# Patient Record
Sex: Male | Born: 1945
Health system: Southern US, Community
[De-identification: ages and names within clinical notes are randomized; demographics above are authoritative.]

## PROBLEM LIST (undated history)

## (undated) DIAGNOSIS — N200 Calculus of kidney: Secondary | ICD-10-CM

## (undated) DIAGNOSIS — Z87898 Personal history of other specified conditions: Secondary | ICD-10-CM

## (undated) DIAGNOSIS — N400 Enlarged prostate without lower urinary tract symptoms: Secondary | ICD-10-CM

## (undated) DIAGNOSIS — R0989 Other specified symptoms and signs involving the circulatory and respiratory systems: Secondary | ICD-10-CM

## (undated) DIAGNOSIS — C801 Malignant (primary) neoplasm, unspecified: Secondary | ICD-10-CM

## (undated) DIAGNOSIS — Z87442 Personal history of urinary calculi: Secondary | ICD-10-CM

## (undated) DIAGNOSIS — T8859XA Other complications of anesthesia, initial encounter: Secondary | ICD-10-CM

## (undated) DIAGNOSIS — C20 Malignant neoplasm of rectum: Secondary | ICD-10-CM

## (undated) DIAGNOSIS — R911 Solitary pulmonary nodule: Secondary | ICD-10-CM

## (undated) DIAGNOSIS — R058 Other specified cough: Secondary | ICD-10-CM

## (undated) DIAGNOSIS — T4145XA Adverse effect of unspecified anesthetic, initial encounter: Secondary | ICD-10-CM

## (undated) DIAGNOSIS — I82409 Acute embolism and thrombosis of unspecified deep veins of unspecified lower extremity: Secondary | ICD-10-CM

## (undated) DIAGNOSIS — J8410 Pulmonary fibrosis, unspecified: Secondary | ICD-10-CM

## (undated) DIAGNOSIS — Q825 Congenital non-neoplastic nevus: Secondary | ICD-10-CM

## (undated) DIAGNOSIS — K219 Gastro-esophageal reflux disease without esophagitis: Secondary | ICD-10-CM

## (undated) DIAGNOSIS — R05 Cough: Secondary | ICD-10-CM

## (undated) DIAGNOSIS — R000 Tachycardia, unspecified: Secondary | ICD-10-CM

## (undated) DIAGNOSIS — R319 Hematuria, unspecified: Secondary | ICD-10-CM

## (undated) DIAGNOSIS — R0600 Dyspnea, unspecified: Secondary | ICD-10-CM

## (undated) DIAGNOSIS — N201 Calculus of ureter: Secondary | ICD-10-CM

## (undated) DIAGNOSIS — R972 Elevated prostate specific antigen [PSA]: Secondary | ICD-10-CM

## (undated) DIAGNOSIS — E785 Hyperlipidemia, unspecified: Secondary | ICD-10-CM

## (undated) HISTORY — DX: Elevated prostate specific antigen (PSA): R97.20

## (undated) HISTORY — DX: Benign prostatic hyperplasia without lower urinary tract symptoms: N40.0

## (undated) HISTORY — DX: Calculus of kidney: N20.0

## (undated) HISTORY — DX: Solitary pulmonary nodule: R91.1

## (undated) HISTORY — DX: Pulmonary fibrosis, unspecified: J84.10

## (undated) HISTORY — PX: TONSILLECTOMY: SUR1361

## (undated) HISTORY — DX: Other specified symptoms and signs involving the circulatory and respiratory systems: R09.89

## (undated) HISTORY — DX: Congenital non-neoplastic nevus: Q82.5

## (undated) HISTORY — DX: Hyperlipidemia, unspecified: E78.5

## (undated) HISTORY — DX: Personal history of other specified conditions: Z87.898

---

## 2005-01-09 HISTORY — PX: EXTRACORPOREAL SHOCK WAVE LITHOTRIPSY: SHX1557

## 2005-07-13 ENCOUNTER — Ambulatory Visit (HOSPITAL_COMMUNITY): Admission: RE | Admit: 2005-07-13 | Discharge: 2005-07-13 | Payer: Self-pay | Admitting: Urology

## 2015-01-27 DIAGNOSIS — Z0000 Encounter for general adult medical examination without abnormal findings: Secondary | ICD-10-CM | POA: Diagnosis not present

## 2015-01-27 DIAGNOSIS — I517 Cardiomegaly: Secondary | ICD-10-CM | POA: Diagnosis not present

## 2015-01-27 DIAGNOSIS — Z1212 Encounter for screening for malignant neoplasm of rectum: Secondary | ICD-10-CM | POA: Diagnosis not present

## 2015-01-27 DIAGNOSIS — N390 Urinary tract infection, site not specified: Secondary | ICD-10-CM | POA: Diagnosis not present

## 2015-01-27 DIAGNOSIS — R918 Other nonspecific abnormal finding of lung field: Secondary | ICD-10-CM | POA: Diagnosis not present

## 2015-01-27 DIAGNOSIS — Z125 Encounter for screening for malignant neoplasm of prostate: Secondary | ICD-10-CM | POA: Diagnosis not present

## 2015-01-27 DIAGNOSIS — N400 Enlarged prostate without lower urinary tract symptoms: Secondary | ICD-10-CM | POA: Diagnosis not present

## 2015-01-27 DIAGNOSIS — R0989 Other specified symptoms and signs involving the circulatory and respiratory systems: Secondary | ICD-10-CM | POA: Diagnosis not present

## 2015-01-27 DIAGNOSIS — N200 Calculus of kidney: Secondary | ICD-10-CM | POA: Diagnosis not present

## 2015-01-28 DIAGNOSIS — Z0000 Encounter for general adult medical examination without abnormal findings: Secondary | ICD-10-CM | POA: Diagnosis not present

## 2015-02-01 ENCOUNTER — Ambulatory Visit (INDEPENDENT_AMBULATORY_CARE_PROVIDER_SITE_OTHER): Payer: PPO | Admitting: Internal Medicine

## 2015-02-01 ENCOUNTER — Encounter: Payer: Self-pay | Admitting: Internal Medicine

## 2015-02-01 VITALS — BP 118/78 | HR 92 | Ht 67.0 in | Wt 185.2 lb

## 2015-02-01 DIAGNOSIS — Z23 Encounter for immunization: Secondary | ICD-10-CM

## 2015-02-01 DIAGNOSIS — J8410 Pulmonary fibrosis, unspecified: Secondary | ICD-10-CM

## 2015-02-01 DIAGNOSIS — R05 Cough: Secondary | ICD-10-CM | POA: Diagnosis not present

## 2015-02-01 DIAGNOSIS — R058 Other specified cough: Secondary | ICD-10-CM

## 2015-02-01 MED ORDER — PANTOPRAZOLE SODIUM 40 MG PO TBEC: 40.0000 mg | DELAYED_RELEASE_TABLET | Freq: Every day | ORAL | Status: DC

## 2015-02-01 MED ORDER — FAMOTIDINE 20 MG PO TABS
ORAL_TABLET | ORAL | Status: DC
Start: 2015-02-01 — End: 2016-03-02

## 2015-02-01 NOTE — Patient Instructions (Signed)
Pantoprazole (protonix) 40 mg   Take  30-60 min before first meal of the day and Pepcid (famotidine)  20 mg one @  bedtime until return to office - this is the best way to tell whether stomach acid is contributing to your problem.    GERD (REFLUX)  is an extremely common cause of respiratory symptoms just like yours , many times with no obvious heartburn at all.    It can be treated with medication, but also with lifestyle changes including elevation of the head of your bed (ideally with 6 inch  bed blocks),  Smoking cessation, avoidance of late meals, excessive alcohol, and avoid fatty foods, chocolate, peppermint, colas, red wine, and acidic juices such as orange juice.  NO MINT OR MENTHOL PRODUCTS SO NO COUGH DROPS  USE SUGARLESS CANDY INSTEAD (Jolley ranchers or Stover's or Life Savers) or even ice chips will also do - the key is to swallow to prevent all throat clearing. NO OIL BASED VITAMINS - use powdered substitutes.   Please schedule a follow up office visit in 6 weeks, call sooner if needed with cxr and pfts

## 2015-02-01 NOTE — Progress Notes (Signed)
   Subjective:    Patient ID: Austin Yu, male    DOB: 02/21/1945,    MRN: [id number]  HPI  52 yowm never smoker with cough onset summer 2016 eval with cxr by Dr Shelia Media who referred him to pulmonary clinic 02/01/2015 with abn cxr ? PF   02/01/2015 1st [facility] office visit/ Trevious Rampey   Chief Complaint  Patient presents with  . Pulmonary Consult    Referred by Dr Shelia Media for abn cxr.   indolent onset dry persistent daily cough x 6 m worse at hs   / worse with laugh /use of voice / no h/o sign arthritis/ amio or chemo exposures  No obvious other patterns in day to day or daytime variabilty or assoc sob or cp or chest tightness, subjective wheeze overt sinus or hb symptoms. No unusual exp hx or h/o childhood pna/ asthma or knowledge of premature birth.  Sleeping ok without nocturnal  or early am exacerbation  of respiratory  c/o's or need for noct saba. Also denies any obvious fluctuation of symptoms with weather or environmental changes or other aggravating or alleviating factors except as outlined above   Current Medications, Allergies, Complete Past Medical History, Past Surgical History, Family History, and Social History were reviewed in [employer] record.              Review of Systems  Constitutional: Negative for fever and unexpected weight change.  HENT: Negative for congestion, dental problem, ear pain, nosebleeds, postnasal drip, rhinorrhea, sinus pressure, sneezing, sore throat and trouble swallowing.   Eyes: Negative for redness and itching.  Respiratory: Positive for cough. Negative for chest tightness, shortness of breath and wheezing.   Cardiovascular: Negative for palpitations and leg swelling.  Gastrointestinal: Negative for nausea and vomiting.  Genitourinary: Negative for dysuria.  Musculoskeletal: Negative for joint swelling.  Skin: Negative for rash.  Neurological: Negative for headaches.  Hematological: Does not bruise/bleed easily.    Psychiatric/Behavioral: Negative for dysphoric mood. The patient is not nervous/anxious.        Objective:   Physical Exam  amb wm nad   Wt Readings from Last 3 Encounters:  02/01/15 185 lb 3.2 oz (84.006 kg)    Vital signs reviewed  HEENT: nl dentition, turbinates, and oropharynx. Nl external ear canals without cough reflex   NECK :  without JVD/Nodes/TM/ nl carotid upstrokes bilaterally   LUNGS: no acc muscle use,  Nl contour chest with subtle end insp crackles both bases    CV:  RRR  no s3 or murmur or increase in P2, no edema   ABD:  soft and nontender with nl inspiratory excursion in the supine position. No bruits or organomegaly, bowel sounds nl  MS:  Nl gait/ ext warm without deformities, calf tenderness, cyanosis  - mild bilateral upper ext clubbing No obvious joint restrictions   SKIN: warm and dry without lesions    NEURO:  alert, approp, nl sensorium with  no motor deficits     I personally reviewed images and agree with radiology impression as follows:   CXR  01/27/15 slt full R hilar/sub hilar on PA only / slt increase int markings and mild reduction in lung vols     Assessment & Plan:

## 2015-02-02 DIAGNOSIS — R05 Cough: Secondary | ICD-10-CM | POA: Insufficient documentation

## 2015-02-02 DIAGNOSIS — R058 Other specified cough: Secondary | ICD-10-CM | POA: Insufficient documentation

## 2015-02-02 NOTE — Assessment & Plan Note (Addendum)
DDx for pulmonary fibrosis  includes idiopathic pulmonary fibrosis, pulmonary fibrosis associated with rheumatologic diseases (which have a relatively benign course in most cases) , adverse effect from  drugs such as chemotherapy or amiodarone exposure, nonspecific interstitial pneumonia which is typically steroid responsive, and chronic hypersensitivity pneumonitis.   In active  smokers Langerhan's Cell  Histiocyctosis (eosinophilic granuomatosis),  DIP,  and Respiratory Bronchiolitis ILD also need to be considered,  But don't apply here  Will need to return for full w/u but in meantime :  Use of PPI is associated with improved survival time and with decreased radiologic fibrosis per King's study published in AJRCCM vol 184 p1390.  Dec 2011 and also may have other beneficial effects as per the latest review in [place] vol 193 H2122 Jun 20016.  This may not always be cause and effect, but given how universally unimpressive and expensive  all the other  Drugs developed to day  have been for pf,   rec start  rx ppi / diet/ lifestyle modification and f/u with serial walking sats and lung volumes for now to put more points on the curve / establish firm baseline before considering additional measures.   I had an extended discussion with the patient reviewing all relevant studies completed to date and  lasting 35 minutes of a 60 minute initial office visit    Each maintenance medication was reviewed in detail including most importantly the difference between maintenance and prns and under what circumstances the prns are to be triggered using an action plan format that is not reflected in the computer generated alphabetically organized AVS.    Please see instructions for details which were reviewed in writing and the patient given a copy highlighting the part that I personally wrote and discussed at today's ov.

## 2015-02-02 NOTE — Assessment & Plan Note (Signed)
The cough that is worse with laughing and talking is not likely related to PF but rather  Classic Upper airway cough syndrome, so named because it's frequently impossible to sort out how much is  CR/sinusitis with freq throat clearing (which can be related to primary GERD)   vs  causing  secondary (" extra esophageal")  GERD from wide swings in gastric pressure that occur with throat clearing, often  promoting self use of mint and menthol lozenges that reduce the lower esophageal sphincter tone and exacerbate the problem further in a cyclical fashion.   These are the same pts (now being labeled as having "irritable larynx syndrome" by some cough centers) who not infrequently have a history of having failed to tolerate ace inhibitors,  dry powder inhalers or biphosphonates or report having atypical reflux symptoms that don't respond to standard doses of PPI , and are easily confused as having aecopd or asthma flares by even experienced allergists/ pulmonologists.   rec max gerd rx/diet for now as this is beneficial in many pts with pf as well

## 2015-03-29 DIAGNOSIS — K219 Gastro-esophageal reflux disease without esophagitis: Secondary | ICD-10-CM | POA: Diagnosis not present

## 2015-03-29 DIAGNOSIS — R3129 Other microscopic hematuria: Secondary | ICD-10-CM | POA: Diagnosis not present

## 2015-03-29 DIAGNOSIS — J8410 Pulmonary fibrosis, unspecified: Secondary | ICD-10-CM | POA: Diagnosis not present

## 2015-04-09 ENCOUNTER — Encounter: Payer: Self-pay | Admitting: Internal Medicine

## 2015-04-09 ENCOUNTER — Ambulatory Visit (INDEPENDENT_AMBULATORY_CARE_PROVIDER_SITE_OTHER)
Admission: RE | Admit: 2015-04-09 | Discharge: 2015-04-09 | Disposition: A | Discharge status: Home / Self Care | Payer: PPO | Source: Ambulatory Visit | Attending: Internal Medicine | Admitting: Internal Medicine

## 2015-04-09 ENCOUNTER — Ambulatory Visit (INDEPENDENT_AMBULATORY_CARE_PROVIDER_SITE_OTHER): Payer: PPO | Admitting: Internal Medicine

## 2015-04-09 VITALS — BP 110/80 | HR 90 | Ht 67.0 in | Wt 186.0 lb

## 2015-04-09 DIAGNOSIS — R058 Other specified cough: Secondary | ICD-10-CM

## 2015-04-09 DIAGNOSIS — J8410 Pulmonary fibrosis, unspecified: Secondary | ICD-10-CM

## 2015-04-09 DIAGNOSIS — R05 Cough: Secondary | ICD-10-CM

## 2015-04-09 LAB — PULMONARY FUNCTION TEST
DL/VA % PRED: 111 %
DL/VA: 4.94 ml/min/mmHg/L
DLCO UNC % PRED: 53 %
DLCO UNC: 15.23 ml/min/mmHg
DLCO cor % pred: 51 %
DLCO cor: 14.61 ml/min/mmHg
FEF 25-75 PRE: 2.9 L/s
FEF 25-75 Post: 3.13 L/sec
FEF2575-%CHANGE-POST: 7 %
FEF2575-%PRED-PRE: 127 %
FEF2575-%Pred-Post: 137 %
FEV1-%CHANGE-POST: -1 %
FEV1-%PRED-PRE: 70 %
FEV1-%Pred-Post: 69 %
FEV1-POST: 2.04 L
FEV1-PRE: 2.06 L
FEV1FVC-%Change-Post: 4 %
FEV1FVC-%Pred-Pre: 118 %
FEV6-%CHANGE-POST: -5 %
FEV6-%PRED-POST: 59 %
FEV6-%PRED-PRE: 62 %
FEV6-POST: 2.22 L
FEV6-PRE: 2.35 L
FEV6FVC-%PRED-POST: 106 %
FEV6FVC-%PRED-PRE: 106 %
FVC-%Change-Post: -5 %
FVC-%Pred-Post: 55 %
FVC-%Pred-Pre: 58 %
FVC-Post: 2.22 L
FVC-Pre: 2.35 L
POST FEV6/FVC RATIO: 100 %
PRE FEV6/FVC RATIO: 100 %
Post FEV1/FVC ratio: 92 %
Pre FEV1/FVC ratio: 88 %
RV % PRED: 63 %
RV: 1.42 L
TLC % PRED: 58 %
TLC: 3.75 L

## 2015-04-09 NOTE — Progress Notes (Signed)
PFT done today. 

## 2015-04-09 NOTE — Progress Notes (Signed)
Subjective:    Patient ID: Austin Yu, male    DOB: Jan 07, 1946     MRN: [id number]    Brief patient profile:  34 yowm never smoker with cough onset summer 2016 eval with cxr by Dr Shelia Media who referred him to pulmonary clinic 02/01/2015 with abn cxr ? PF    History of Present Illness  02/01/2015 1st [facility] office visit/ Austin Yu   Chief Complaint  Patient presents with  . Pulmonary Consult    Referred by Dr Shelia Media for abn cxr.   indolent onset dry persistent daily cough x 6 m worse at hs   / worse with laugh /use of voice / no h/o sign arthritis/ amio or chemo exposures rec Pantoprazole (protonix) 40 mg   Take  30-60 min before first meal of the day and Pepcid (famotidine)  20 mg one @  bedtime until return to office   GERD diet    04/09/2015  f/u ov/Cori Henningsen re: PF on strictly gerd rx  Chief Complaint  Patient presents with  . Follow-up    PFT done today. Cough has improved. No new co's today.   able to do gxt 3 x week x grade 1/ 3.57mh x 2 miles in 30 min and feels good Cough much better / no arthritis hx or unusual exp hx   No obvious day to day or daytime variability or assoc excess/ purulent sputum or mucus plugs  cp or chest tightness, subjective wheeze or overt sinus or hb symptoms. No unusual exp hx or h/o childhood pna/ asthma or knowledge of premature birth.  Sleeping ok without nocturnal  or early am exacerbation  of respiratory  c/o's or need for noct saba. Also denies any obvious fluctuation of symptoms with weather or environmental changes or other aggravating or alleviating factors except as outlined above   Current Medications, Allergies, Complete Past Medical History, Past Surgical History, Family History, and Social History [facility] [employer].  ROS  The following are not active complaints unless bolded sore throat, dysphagia, dental problems, itching, sneezing,  nasal congestion or excess/ purulent secretions, ear ache,    fever, chills, sweats, unintended wt loss, classically pleuritic or exertional cp, hemoptysis,  orthopnea pnd or leg swelling, presyncope, palpitations, abdominal pain, anorexia, nausea, vomiting, diarrhea  or change in bowel or bladder habits, change in stools or urine, dysuria,hematuria,  rash, arthralgias, visual complaints, headache, numbness, weakness or ataxia or problems with walking or coordination,  change in mood/affect or memory.         Objective:   Physical Exam  amb wm nad     Wt Readings from Last 3 Encounters:  04/09/15 186 lb (84.369 kg)  02/01/15 185 lb 3.2 oz (84.006 kg)    Vital signs reviewed    HEENT: nl dentition, turbinates, and oropharynx. Nl external ear canals without cough reflex   NECK :  without JVD/Nodes/TM/ nl carotid upstrokes bilaterally   LUNGS: no acc muscle use,  Nl contour chest with subtle end insp crackles both bases    CV:  RRR  no s3 or murmur or increase in P2, no edema   ABD:  soft and nontender with nl inspiratory excursion in the supine position. No bruits or organomegaly, bowel sounds nl  MS:  Nl gait/ ext warm without deformities, calf tenderness, cyanosis  - very minimal  bilateral upper ext clubbing No obvious joint restrictions   SKIN: warm and dry without lesions  NEURO:  alert, approp, nl sensorium with  no motor deficits    CXR PA and Lateral:   04/09/2015 :    I personally reviewed images and agree with radiology impression as follows:   Diffuse pulmonary interstitial prominence with subpleural reticulation and hilar distortion consistent with pulmonary fibrosis.      Assessment & Plan:

## 2015-04-09 NOTE — Assessment & Plan Note (Addendum)
Onset of cough summer 2016 > improved on GERD rx 04/09/2015  -  PFT's  04/09/2015   VC 58%   with DLCO  53/62 % corrects to 111 % for alv volume  I had an extended discussion with the patient reviewing all relevant studies completed to date and  lasting 15 to 20 minutes of a 25 minute visit   Cough response very favorable as is his improving ex tol now doing regular gxt so reasonable to try 6 m rx for gerd then re assess in 6 m to connect the dots and see if stabilized - in menatime if notes decline in ex tol needs HRCT next   Discussed in detail all the  indications, usual  risks and alternatives  relative to the benefits with patient who agrees to proceed with conservative f/u as outlined    Each maintenance medication was reviewed in detail including most importantly the difference between maintenance and prns and under what circumstances the prns are to be triggered using an action plan format that is not reflected in the computer generated alphabetically organized AVS.    Please see instructions for details which were reviewed in writing and the patient given a copy highlighting the part that I personally wrote and discussed at today's ov.

## 2015-04-09 NOTE — Progress Notes (Signed)
Quick Note:  Called and spoke to pt. Informed him of the results and recs per MW. Pt verbalized understanding and denied any further questions or concerns at this time.   ______

## 2015-04-09 NOTE — Patient Instructions (Signed)
Increase your speed to where you a little short of breath never out of breath   Don't change diet or reflex meds for 6 months  Please remember to go to the  x-ray department downstairs for your tests - we will call you with the results when they are available.  Please schedule a follow up visit in 6  months but call sooner if needed with pfts on return

## 2015-04-21 DIAGNOSIS — N200 Calculus of kidney: Secondary | ICD-10-CM | POA: Diagnosis not present

## 2015-04-21 DIAGNOSIS — R3129 Other microscopic hematuria: Secondary | ICD-10-CM | POA: Diagnosis not present

## 2015-04-21 DIAGNOSIS — Z0000 Encounter for general adult medical examination without abnormal findings: Secondary | ICD-10-CM | POA: Diagnosis not present

## 2015-05-05 DIAGNOSIS — N200 Calculus of kidney: Secondary | ICD-10-CM | POA: Diagnosis not present

## 2015-05-05 DIAGNOSIS — K8020 Calculus of gallbladder without cholecystitis without obstruction: Secondary | ICD-10-CM | POA: Diagnosis not present

## 2015-05-05 DIAGNOSIS — N132 Hydronephrosis with renal and ureteral calculous obstruction: Secondary | ICD-10-CM | POA: Diagnosis not present

## 2015-05-05 DIAGNOSIS — R3129 Other microscopic hematuria: Secondary | ICD-10-CM | POA: Diagnosis not present

## 2015-05-17 ENCOUNTER — Encounter (HOSPITAL_COMMUNITY): Payer: Self-pay | Admitting: *Deleted

## 2015-05-17 ENCOUNTER — Other Ambulatory Visit: Payer: Self-pay | Admitting: Urology

## 2015-05-17 DIAGNOSIS — Z0000 Encounter for general adult medical examination without abnormal findings: Secondary | ICD-10-CM | POA: Diagnosis not present

## 2015-05-17 DIAGNOSIS — N202 Calculus of kidney with calculus of ureter: Secondary | ICD-10-CM | POA: Diagnosis not present

## 2015-05-17 DIAGNOSIS — N200 Calculus of kidney: Secondary | ICD-10-CM | POA: Diagnosis not present

## 2015-05-17 DIAGNOSIS — R3914 Feeling of incomplete bladder emptying: Secondary | ICD-10-CM | POA: Diagnosis not present

## 2015-05-17 DIAGNOSIS — N401 Enlarged prostate with lower urinary tract symptoms: Secondary | ICD-10-CM | POA: Diagnosis not present

## 2015-05-18 ENCOUNTER — Ambulatory Visit (HOSPITAL_COMMUNITY): Payer: PPO | Admitting: Anesthesiology

## 2015-05-18 ENCOUNTER — Encounter (HOSPITAL_COMMUNITY)
Admission: RE | Disposition: A | Discharge status: Home / Self Care | Payer: Self-pay | Source: Ambulatory Visit | Attending: Urology

## 2015-05-18 ENCOUNTER — Encounter (HOSPITAL_COMMUNITY): Payer: Self-pay

## 2015-05-18 ENCOUNTER — Ambulatory Visit (HOSPITAL_COMMUNITY): Payer: PPO

## 2015-05-18 ENCOUNTER — Ambulatory Visit (HOSPITAL_COMMUNITY)
Admission: RE | Admit: 2015-05-18 | Discharge: 2015-05-18 | Disposition: A | Discharge status: Home / Self Care | Payer: PPO | Source: Ambulatory Visit | Attending: Urology | Admitting: Urology

## 2015-05-18 DIAGNOSIS — R3914 Feeling of incomplete bladder emptying: Secondary | ICD-10-CM | POA: Diagnosis not present

## 2015-05-18 DIAGNOSIS — N401 Enlarged prostate with lower urinary tract symptoms: Secondary | ICD-10-CM | POA: Diagnosis not present

## 2015-05-18 DIAGNOSIS — Z87442 Personal history of urinary calculi: Secondary | ICD-10-CM | POA: Insufficient documentation

## 2015-05-18 DIAGNOSIS — K219 Gastro-esophageal reflux disease without esophagitis: Secondary | ICD-10-CM | POA: Diagnosis not present

## 2015-05-18 DIAGNOSIS — N3000 Acute cystitis without hematuria: Secondary | ICD-10-CM | POA: Diagnosis not present

## 2015-05-18 DIAGNOSIS — N132 Hydronephrosis with renal and ureteral calculous obstruction: Secondary | ICD-10-CM | POA: Diagnosis not present

## 2015-05-18 DIAGNOSIS — T888XXA Other specified complications of surgical and medical care, not elsewhere classified, initial encounter: Secondary | ICD-10-CM

## 2015-05-18 DIAGNOSIS — Z79899 Other long term (current) drug therapy: Secondary | ICD-10-CM | POA: Diagnosis not present

## 2015-05-18 DIAGNOSIS — N201 Calculus of ureter: Secondary | ICD-10-CM | POA: Diagnosis not present

## 2015-05-18 DIAGNOSIS — Z4682 Encounter for fitting and adjustment of non-vascular catheter: Secondary | ICD-10-CM | POA: Diagnosis not present

## 2015-05-18 DIAGNOSIS — R3915 Urgency of urination: Secondary | ICD-10-CM | POA: Diagnosis not present

## 2015-05-18 HISTORY — PX: CYSTOSCOPY WITH URETEROSCOPY, STONE BASKETRY AND STENT PLACEMENT: SHX6378

## 2015-05-18 LAB — HEMOGLOBIN: HEMOGLOBIN: 14.7 g/dL (ref 13.0–17.0)

## 2015-05-18 SURGERY — CYSTOSCOPY, WITH CALCULUS MANIPULATION OR REMOVAL
Anesthesia: General | Laterality: Left

## 2015-05-18 MED ORDER — MIDAZOLAM HCL 2 MG/2ML IJ SOLN
INTRAMUSCULAR | Status: AC
Start: 2015-05-18 — End: 2015-05-18
  Filled 2015-05-18: qty 2

## 2015-05-18 MED ORDER — LIDOCAINE HCL (CARDIAC) 20 MG/ML IV SOLN
INTRAVENOUS | Status: DC | PRN
  Administered 2015-05-18: 50 mg via INTRAVENOUS
  Administered 2015-05-18: 100 mg via INTRAVENOUS

## 2015-05-18 MED ORDER — SODIUM CHLORIDE 0.9% FLUSH: 3.0000 mL | Freq: Two times a day (BID) | INTRAVENOUS | Status: DC

## 2015-05-18 MED ORDER — PROPOFOL 10 MG/ML IV BOLUS
INTRAVENOUS | Status: AC
  Filled 2015-05-18: qty 20

## 2015-05-18 MED ORDER — OXYCODONE HCL 5 MG PO TABS: 5.0000 mg | ORAL_TABLET | ORAL | Status: DC | PRN

## 2015-05-18 MED ORDER — NALOXONE HCL 0.4 MG/ML IJ SOLN
INTRAMUSCULAR | Status: AC
  Filled 2015-05-18: qty 1

## 2015-05-18 MED ORDER — SODIUM CHLORIDE 0.9 % IV SOLN: 250.0000 mL | INTRAVENOUS | Status: DC | PRN

## 2015-05-18 MED ORDER — SODIUM CHLORIDE 0.9 % IR SOLN
Status: DC | PRN
  Administered 2015-05-18: 3000 mL

## 2015-05-18 MED ORDER — FENTANYL CITRATE (PF) 100 MCG/2ML IJ SOLN: 25.0000 ug | INTRAMUSCULAR | Status: DC | PRN

## 2015-05-18 MED ORDER — MIDAZOLAM HCL 5 MG/5ML IJ SOLN
INTRAMUSCULAR | Status: DC | PRN
  Administered 2015-05-18: 2 mg via INTRAVENOUS

## 2015-05-18 MED ORDER — PROPOFOL 10 MG/ML IV BOLUS
INTRAVENOUS | Status: DC | PRN
Start: 2015-05-18 — End: 2015-05-18
  Administered 2015-05-18: 40 mg via INTRAVENOUS
  Administered 2015-05-18: 160 mg via INTRAVENOUS
  Administered 2015-05-18: 30 mg via INTRAVENOUS

## 2015-05-18 MED ORDER — DEXMEDETOMIDINE BOLUS VIA INFUSION
1.0000 ug/kg | Freq: Once | INTRAVENOUS | Status: DC
  Filled 2015-05-18: qty 84

## 2015-05-18 MED ORDER — DIATRIZOATE MEGLUMINE 30 % UR SOLN
URETHRAL | Status: AC
  Filled 2015-05-18: qty 300

## 2015-05-18 MED ORDER — SODIUM CHLORIDE 0.9% FLUSH: 3.0000 mL | INTRAVENOUS | Status: DC | PRN

## 2015-05-18 MED ORDER — EPHEDRINE SULFATE 50 MG/ML IJ SOLN
INTRAMUSCULAR | Status: DC | PRN
  Administered 2015-05-18: 15 mg via INTRAVENOUS

## 2015-05-18 MED ORDER — ACETAMINOPHEN 325 MG PO TABS: 650.0000 mg | ORAL_TABLET | ORAL | Status: DC | PRN

## 2015-05-18 MED ORDER — PHENYLEPHRINE HCL 10 MG/ML IJ SOLN
INTRAMUSCULAR | Status: DC | PRN
  Administered 2015-05-18: 120 ug via INTRAVENOUS
  Administered 2015-05-18: 160 ug via INTRAVENOUS
  Administered 2015-05-18: 120 ug via INTRAVENOUS

## 2015-05-18 MED ORDER — CEFAZOLIN SODIUM-DEXTROSE 2-4 GM/100ML-% IV SOLN
2.0000 g | INTRAVENOUS | Status: AC
  Administered 2015-05-18: 2 g via INTRAVENOUS
  Filled 2015-05-18: qty 100

## 2015-05-18 MED ORDER — DIATRIZOATE MEGLUMINE 30 % UR SOLN
URETHRAL | Status: DC | PRN
  Administered 2015-05-18: 6 mL via URETHRAL

## 2015-05-18 MED ORDER — CEFAZOLIN SODIUM-DEXTROSE 2-4 GM/100ML-% IV SOLN
INTRAVENOUS | Status: AC
  Filled 2015-05-18: qty 100

## 2015-05-18 MED ORDER — FENTANYL CITRATE (PF) 100 MCG/2ML IJ SOLN
INTRAMUSCULAR | Status: AC
  Filled 2015-05-18: qty 2

## 2015-05-18 MED ORDER — ACETAMINOPHEN 650 MG RE SUPP
650.0000 mg | RECTAL | Status: DC | PRN
  Filled 2015-05-18: qty 1

## 2015-05-18 MED ORDER — PROCHLORPERAZINE EDISYLATE 5 MG/ML IJ SOLN: 10.0000 mg | Freq: Once | INTRAMUSCULAR | Status: DC | PRN

## 2015-05-18 MED ORDER — FENTANYL CITRATE (PF) 100 MCG/2ML IJ SOLN
INTRAMUSCULAR | Status: DC | PRN
  Administered 2015-05-18: 100 ug via INTRAVENOUS

## 2015-05-18 MED ORDER — OXYCODONE-ACETAMINOPHEN 5-325 MG PO TABS: 1.0000 | ORAL_TABLET | Freq: Four times a day (QID) | ORAL | Status: DC | PRN

## 2015-05-18 MED ORDER — NALOXONE HCL 0.4 MG/ML IJ SOLN
INTRAMUSCULAR | Status: AC
  Administered 2015-05-18: 80 ug via INTRAVENOUS
  Filled 2015-05-18: qty 1

## 2015-05-18 MED ORDER — LACTATED RINGERS IV SOLN
INTRAVENOUS | Status: DC
  Administered 2015-05-18: 12:00:00 via INTRAVENOUS
  Administered 2015-05-18: 1000 mL via INTRAVENOUS

## 2015-05-18 MED ORDER — IOPAMIDOL (ISOVUE-300) INJECTION 61%
INTRAVENOUS | Status: AC
  Filled 2015-05-18: qty 50

## 2015-05-18 MED ORDER — SUCCINYLCHOLINE CHLORIDE 20 MG/ML IJ SOLN
INTRAMUSCULAR | Status: DC | PRN
  Administered 2015-05-18: 100 mg via INTRAVENOUS

## 2015-05-18 SURGICAL SUPPLY — 23 items
BAG URO CATCHER STRL LF (MISCELLANEOUS) ×3 IMPLANT
BASKET LASER NITINOL 1.9FR (BASKET) IMPLANT
BASKET STONE NCOMPASS (UROLOGICAL SUPPLIES) IMPLANT
BSKT STON RTRVL 120 1.9FR (BASKET)
CATH URET 5FR 28IN OPEN ENDED (CATHETERS) IMPLANT
CATH URET DUAL LUMEN 6-10FR 50 (CATHETERS) IMPLANT
CLOTH BEACON ORANGE TIMEOUT ST (SAFETY) ×3 IMPLANT
FIBER LASER FLEXIVA 1000 (UROLOGICAL SUPPLIES) IMPLANT
FIBER LASER FLEXIVA 365 (UROLOGICAL SUPPLIES) IMPLANT
FIBER LASER FLEXIVA 550 (UROLOGICAL SUPPLIES) IMPLANT
GLOVE SURG SS PI 8.0 STRL IVOR (GLOVE) IMPLANT
GOWN STRL REUS W/TWL XL LVL3 (GOWN DISPOSABLE) ×3 IMPLANT
GUIDEWIRE STR DUAL SENSOR (WIRE) ×3 IMPLANT
IV NS 1000ML (IV SOLUTION) ×3
IV NS 1000ML BAXH (IV SOLUTION) ×1 IMPLANT
IV NS IRRIG 3000ML ARTHROMATIC (IV SOLUTION) ×3 IMPLANT
MANIFOLD NEPTUNE II (INSTRUMENTS) ×3 IMPLANT
PACK CYSTO (CUSTOM PROCEDURE TRAY) ×3 IMPLANT
SHEATH ACCESS URETERAL 38CM (SHEATH) IMPLANT
SHEATH URET ACCESS 10/12FR (MISCELLANEOUS) IMPLANT
STENT URET 6FRX26 CONTOUR (STENTS) ×2 IMPLANT
TUBING CONNECTING 10 (TUBING) ×2 IMPLANT
TUBING CONNECTING 10' (TUBING) ×1

## 2015-05-18 NOTE — Transfer of Care (Signed)
Immediate Anesthesia Transfer of Care Note  Patient: Austin Yu  Procedure(s) Performed: Procedure(s): CYSTOSCOPY WITH STENT PLACEMENT AND RETROGRADE PYELOGRAM (Left)  Patient Location: PACU  Anesthesia Type:General  Level of Consciousness: awake, alert  and oriented  Airway & Oxygen Therapy: Patient Spontanous Breathing and Patient connected to face mask oxygen  Post-op Assessment: Report given to RN and Post -op Vital signs reviewed and stable  Post vital signs: Reviewed and stable  Last Vitals:  Filed Vitals:   05/18/15 0917 05/18/15 1245  BP: 108/62   Pulse: 91   Temp: 36.6 C 36.9 C  Resp: 18     Last Pain: There were no vitals filed for this visit.    Patients Stated Pain Goal: 4 (08/23/46 1856)  Complications: No apparent anesthesia complications

## 2015-05-18 NOTE — Anesthesia Preprocedure Evaluation (Addendum)
Anesthesia Evaluation  Patient identified by MRN, date of birth, ID band Patient awake    Reviewed: Allergy & Precautions, NPO status , Patient's Chart, lab work & pertinent test results  Airway Mallampati: II  TM Distance: >3 FB Neck ROM: Full    Dental no notable dental hx.    Pulmonary pneumonia, resolved,  Postinflammatory pulmonary fibrosis; upper airway cough syndrome.  CXR IMPRESSION: Diffuse pulmonary interstitial prominence with subpleural reticulation and hilar distortion consistent with pulmonary fibrosis. No prior studies are available for correlation. Correlation with prior studies recommended. Chest CT may be helpful for further evaluation and to exclude superimposed hilar pathology.   Pulmonary exam normal breath sounds clear to auscultation       Cardiovascular negative cardio ROS Normal cardiovascular exam Rhythm:Regular Rate:Normal     Neuro/Psych negative neurological ROS  negative psych ROS   GI/Hepatic negative GI ROS, Neg liver ROS,   Endo/Other  negative endocrine ROS  Renal/GU negative Renal ROS  negative genitourinary   Musculoskeletal negative musculoskeletal ROS (+)   Abdominal   Peds negative pediatric ROS (+)  Hematology negative hematology ROS (+)   Anesthesia Other Findings   Reproductive/Obstetrics negative OB ROS                            Anesthesia Physical Anesthesia Plan  ASA: II  Anesthesia Plan: General   Post-op Pain Management:    Induction: Intravenous  Airway Management Planned:   Additional Equipment:   Intra-op Plan:   Post-operative Plan: Extubation in OR  Informed Consent: I have reviewed the patients History and Physical, chart, labs and discussed the procedure including the risks, benefits and alternatives for the proposed anesthesia with the patient or authorized representative who has indicated his/her understanding and  acceptance.   Dental advisory given  Plan Discussed with: CRNA  Anesthesia Plan Comments:         Anesthesia Quick Evaluation

## 2015-05-18 NOTE — Brief Op Note (Signed)
05/18/2015  11:59 AM  PATIENT:  Austin Yu  70 y.o. male  PRE-OPERATIVE DIAGNOSIS:  MULTIPLE LEFT URETERAL STONES  POST-OPERATIVE DIAGNOSIS:  MULTIPLE LEFT URETERAL STONES and UTI  PROCEDURE:  Procedure(s): CYSTOSCOPY,  STENT PLACEMENT WITH RETROGRADE PYELOGRAM (Left)  SURGEON:  Surgeon(s) and Role:    * Irine Seal, MD - Primary  PHYSICIAN ASSISTANT:   ASSISTANTS: none   ANESTHESIA:   general  EBL:     BLOOD ADMINISTERED:none  DRAINS: 6 x 26 left JJ stent   LOCAL MEDICATIONS USED:  NONE  SPECIMEN:  Source of Specimen:  left renal urine culture  DISPOSITION OF SPECIMEN:  PATHOLOGY  COUNTS:  YES  TOURNIQUET:  * No tourniquets in log *  DICTATION: .Other Dictation: Dictation Number [id number]  PLAN OF CARE: Discharge to home after PACU  PATIENT DISPOSITION:  PACU - hemodynamically stable.   Delay start of Pharmacological VTE agent (>24hrs) due to surgical blood loss or risk of bleeding: not applicable

## 2015-05-18 NOTE — Anesthesia Postprocedure Evaluation (Signed)
Anesthesia Post Note  Patient: Austin Yu  Procedure(s) Performed: Procedure(s) (LRB): CYSTOSCOPY WITH STENT PLACEMENT AND RETROGRADE PYELOGRAM (Left)  Patient location during evaluation: PACU Anesthesia Type: General Level of consciousness: awake and alert Pain management: pain level controlled Vital Signs Assessment: post-procedure vital signs reviewed and stable Respiratory status: spontaneous breathing, nonlabored ventilation, respiratory function stable and patient connected to nasal cannula oxygen Cardiovascular status: blood pressure returned to baseline and stable Postop Assessment: no signs of nausea or vomiting Anesthetic complications: no Comments: [name] had a rocky emergence from general anesthesia with coughing and refusal to breathe regularly. CXR obtained in PACU is without acute changes. Chronic interstitial fibrotic changes noted.  He is now without complaint and oxygen saturation is 92-93% on room air. Breathing unlabored. He has been given an incentive spirometer. Discussed events with [name]. He understands his pulmonary condition and will seek care if any new symptoms.    Last Vitals:  Filed Vitals:   05/18/15 1355 05/18/15 1410  BP: 110/78 105/66  Pulse:  97  Temp: 36.4 C 36.7 C  Resp:  14    Last Pain: There were no vitals filed for this visit.               Alioune Hodgkin J

## 2015-05-18 NOTE — H&P (Signed)
Chief Complaint urgency   Active Problems Problems  1. Benign prostatic hypertrophy (BPH) with incomplete bladder emptying (N40.1,R39.14) 2. Calculus of distal left ureter (N20.1) 3. Microscopic hematuria (R31.29) 4. Nephrolithiasis (N20.0) 5. Pyuria (N39.0)  History of Present Illness Austin Yu returns today in f/u with increased voiding symptoms with constant urgency and mild dysuria. He has a left distal ureteral stone that was seen on CT on 4/26 done for hematuria.  He has no left flank pain.  The voiding symptoms started about Weds of last week. He has no fever. He has no nausea. He started flomax yesterday and had some nausea and lightheadedness.   Past Medical History Problems  1. History of esophageal reflux (Z87.19)  Surgical History Problems  1. History of No Surgical Problems  Current Meds 1. Famotidine 20 MG Oral Tablet;  Therapy: (Recorded:12Apr2017) to Recorded 2. Pantoprazole Sodium 40 MG Oral Tablet Delayed Release;  Therapy: (Recorded:12Apr2017) to Recorded 3. Tamsulosin HCl - 0.4 MG Oral Capsule; TAKE 1 CAPSULE Daily;  Therapy: 28Apr2017 to (Evaluate:28May2017)  Requested for: 28Apr2017; Last  Rx:28Apr2017 Ordered  Allergies Medication  1. No Known Drug Allergies  Family History Problems  1. Family history of Death of family member : Father 2. Family history of congestive heart failure (Z82.49) : Father 3. No pertinent family history : Mother, Sibling  Social History Problems  1. Alcohol use (Z78.9)   one glass/beer once or twice a week 2. Never a smoker 3. No caffeine use 4. Number of children   2 daughters 5. Single  Review of Systems  Genitourinary: urinary frequency, feelings of urinary urgency and dysuria.  Gastrointestinal: nausea, but no flank pain.  Constitutional: no fever.  Cardiovascular: no chest pain.  Respiratory: no shortness of breath.    Physical Exam Constitutional: Well nourished and well developed . No acute distress.   Neck: The appearance of the neck is normal and no neck mass is present.  Pulmonary: No respiratory distress and normal respiratory rhythm and effort.  Cardiovascular: Heart rate and rhythm are normal . No peripheral edema.  Abdomen: The abdomen is soft and nontender. No masses are palpated. No CVA tenderness. No hernias are palpable. No hepatosplenomegaly noted.    Results/Data Urine [Data Includes: Last 1 Day]   08May2017  COLOR YELLOW   APPEARANCE CLEAR   SPECIFIC GRAVITY 1.015   pH 7.5   GLUCOSE NEGATIVE   BILIRUBIN NEGATIVE   KETONE NEGATIVE   BLOOD 3+   PROTEIN NEGATIVE   NITRITE NEGATIVE   LEUKOCYTE ESTERASE 2+   SQUAMOUS EPITHELIAL/HPF NONE SEEN HPF  WBC 20-40 WBC/HPF  RBC 3-10 RBC/HPF  BACTERIA  HPF  CRYSTALS NONE SEEN HPF  CASTS NONE SEEN LPF  Yeast NONE SEEN HPF   The following images/tracing/specimen were independently visualized:  CT films and KUB reviewed.  The following clinical lab reports were reviewed:  UA reviewed.  PVR: Ultrasound PVR 162 ml. Selected Results  AU CT-HEMATURIA PROTOCOL 26Apr2017 12:00AM Gibson, Larry   Test Name Result Flag Reference  AU CT-HEMATURIA PROTOCOL (Report)    ** RADIOLOGY REPORT BY [facility] **   CLINICAL DATA: Microhematuria, history of renal stones  EXAM: CT ABDOMEN AND PELVIS WITHOUT AND WITH CONTRAST  TECHNIQUE: Multidetector CT imaging of the abdomen and pelvis was performed following the standard protocol before and following the bolus administration of intravenous contrast.  CONTRAST: 125 mL Isovue 300 IV  COMPARISON: 07/11/2005  FINDINGS: Lower chest: Subpleural reticulation/fibrosis with emphysematous changes at the lung bases.  Hepatobiliary:   Liver is within normal limits. No suspicious/enhancing hepatic lesions.  Two layering gallstones measuring up to 1.4 cm. Additional 1.7 cm gallstone in the gallbladder fundus. No associated inflammatory changes. No intrahepatic or extrahepatic  ductal dilatation.  Pancreas: Within normal limits.  Spleen: Within normal limits.  Adrenals/Urinary Tract: Adrenal glands are within normal limits.  Numerous bilateral nonobstructing renal calculi, measuring up to 10 mm in the lateral left upper pole (series 2/ image 39). Moderate left hydroureteronephrosis. Two associated distal left ureteral calculi measuring up to 8 mm (series 4/ image 86), just above the UVJ.  Scattered small bilateral renal cysts, measuring up to 1.4 x 1.9 cm in the lateral interpolar left kidney (series 4/ image 48). No enhancing renal lesions.  On delayed imaging, there are no filling defects in the bilateral opacified proximal collecting systems, ureters, or bladder.  Bladder is mildly thick-walled but underdistended  Stomach/Bowel: Stomach is notable for a small hiatal hernia.  No evidence of bowel obstruction.  Normal appendix (series 4/ image 33).  Sigmoid diverticulosis, without evidence of diverticulitis.  Vascular/Lymphatic: No evidence of abdominal aortic aneurysm.  No suspicious abdominopelvic lymphadenopathy.  Reproductive: Prostatomegaly, with enlargement of the central gland.  Other: No abdominopelvic ascites.  Musculoskeletal: Mild degenerative changes of the visualized thoracolumbar spine.  IMPRESSION: Two distal left ureteral calculi measuring up to 8 mm just above the UVJ. Associated moderate left hydroureteronephrosis.  Numerous additional bilateral nonobstructing renal calculi, measuring up to 10 mm in the left upper pole.  Bilateral renal cysts measuring up to 1.9 cm in the lateral interpolar left kidney. No enhancing renal lesions.  Cholelithiasis, without associated inflammatory changes.  Additional ancillary findings as above.   Electronically Signed  By: Julian Hy M.D.  On: 05/05/2015 09:19   BUN & CREATININE 12Apr2017 03:50PM Jiles Crocker  SPECIMEN TYPE: BLOOD   Test Name Result Flag Reference   CREATININE 1.41 mg/dL  0.50-1.50  BUN 16 mg/dL  7-25  Est GFR, African American 59 mL/min L >=60  Est GFR, NonAfrican American 51 mL/min L >=60  THE ESTIMATED GFR IS A CALCULATION VALID FOR ADULTS (>=73 YEARS OLD) THAT USES THE CKD-EPI ALGORITHM TO ADJUST FOR AGE AND SEX. IT IS   NOT TO BE USED FOR CHILDREN, PREGNANT WOMEN, HOSPITALIZED PATIENTS,    PATIENTS ON DIALYSIS, OR WITH RAPIDLY CHANGING KIDNEY FUNCTION. ACCORDING TO THE NKDEP, EGFR >89 IS NORMAL, 60-89 SHOWS MILD IMPAIRMENT, 30-59 SHOWS MODERATE IMPAIRMENT, 15-29 SHOWS SEVERE IMPAIRMENT AND <15 IS ESRD.   URINE CULTURE 12Apr2017 03:33PM Jiles Crocker  SOURCE : CLEAN CATCH SPECIMEN TYPE: CLEAN CATCH   Test Name Result Flag Reference  CULTURE, URINE Culture, Urine    ===== COLONY COUNT: =====  NO GROWTH   FINAL REPORT: NO GROWTH      KUB today shows about 4 left distal stones with the lead stone about 4m but it doesn't appear to have moved. There do appear to be more stones in the distal ureter than before. He has bilateral renal stones with upper and lower pole left renal stones. He has no other significant bone, gas or soft tissue abnormalities.  Assessment Assessed  1. Calculus of distal left ureter (N20.1) 2. Nephrolithiasis (N20.0) 3. Pyuria (N39.0) 4. Benign prostatic hypertrophy (BPH) with incomplete bladder emptying (N40.1,R39.14)  Left distal stones with increased irritative symptoms and pyuria.  He could have a UTI but with his recent negative culture the symptoms are probably just from the stones.   Plan  Calculus of distal left ureter  1. Start: Oxycodone-Acetaminophen 5-325 MG Oral Tablet; take 1 or 2 tablets q 4-6 hours  prn pain 2. Start: Sulfamethoxazole-Trimethoprim 800-160 MG Oral Tablet; TAKE 1 TABLET BID 3. Follow-up Schedule Surgery Office  Follow-up  Status: Hold For - Appointment   Requested for: 08May2017 4. KUB; Status:Resulted - Requires Verification;   Done: 08May2017 01:38PM Health  Maintenance  5. UA With REFLEX; [Do Not Release]; Status:Resulted - Requires Verification;   Done:  08May2017 12:18PM Microscopic hematuria, Nephrolithiasis  6. Cysto; Status:Canceled - Appointment,Date of Service;   Urine culture today and I will start bactrim pending the culture to be safe.  I will get him set up for later this week for left ureteroscopy with holmium and stenting.  I reviewed the risks in detail including the need for a stent and possible secondary procedures.  I have given him samples of uribel and suggested he try azo for the irritative symptoms.  He will continue the tamsulosin and was given a script for percocet.      URINE CULTURE; Status:In Progress - Specimen/Data Collected;  Done: 08May2017  Perform:[facility]; Due:10May2017; Marked Important; Last Updated By:Kay, Debbie; 05/17/2015 1:29:56 PM;Ordered; Today;   For:Nephrolithiasis; Ordered By:Lariyah Shetterly;   

## 2015-05-18 NOTE — Anesthesia Procedure Notes (Addendum)
Procedure Name: LMA Insertion Performed by: Gean Maidens Pre-anesthesia Checklist: Emergency Drugs available, Patient identified, Suction available, Patient being monitored and Timeout performed Patient Re-evaluated:Patient Re-evaluated prior to inductionOxygen Delivery Method: Circle system utilized Preoxygenation: Pre-oxygenation with 100% oxygen Intubation Type: IV induction Ventilation: Mask ventilation without difficulty LMA: LMA inserted LMA Size: 4.0 Number of attempts: 1 Placement Confirmation: positive ETCO2,  CO2 detector and breath sounds checked- equal and bilateral Tube secured with: Tape Dental Injury: Teeth and Oropharynx as per pre-operative assessment    Procedure Name: Intubation Performed by: Gean Maidens Pre-anesthesia Checklist: Patient identified, Emergency Drugs available, Suction available, Patient being monitored and Timeout performed Patient Re-evaluated:Patient Re-evaluated prior to inductionOxygen Delivery Method: Circle system utilized Preoxygenation: Pre-oxygenation with 100% oxygen Intubation Type: IV induction Ventilation: Mask ventilation without difficulty Laryngoscope Size: Mac and 4 Grade View: Grade I Tube type: Oral Tube size: 7.5 mm Number of attempts: 1 Airway Equipment and Method: Stylet Placement Confirmation: ETT inserted through vocal cords under direct vision,  positive ETCO2,  CO2 detector and breath sounds checked- equal and bilateral Secured at: 23 cm Tube secured with: Tape Dental Injury: Teeth and Oropharynx as per pre-operative assessment

## 2015-05-18 NOTE — Discharge Instructions (Signed)
CYSTOSCOPY HOME CARE INSTRUCTIONS  Activity: Rest for the remainder of the day.  Do not drive or operate equipment today.  You may resume normal activities in one to two days as instructed by your physician.   Meals: Drink plenty of liquids and eat light foods such as gelatin or soup this evening.  You may return to a normal meal plan tomorrow.  Return to Work: You may return to work in one to two days or as instructed by your physician.  Special Instructions / Symptoms: Call your physician if any of these symptoms occur:   -persistent or heavy bleeding  -bleeding which continues after first few urination  -large blood clots that are difficult to pass  -urine stream diminishes or stops completely  -fever equal to or higher than 101 degrees Farenheit.  -cloudy urine with a strong, foul odor  -severe pain  Females should always wipe from front to back after elimination.  You may feel some burning pain when you urinate.  This should disappear with time.  Applying moist heat to the lower abdomen or a hot tub bath may help relieve the pain. \  There was evidence of acute infection in your bladder and the stone was tightly impacted.  As a result, I felt that the safest approach was to place the stent which we will leave for 1-2 weeks and then come back and laser the stone and remove the fragments.  If the bladder still looks abnormal, you might need a biopsy as well.  The stent will cause bladder irritation and you may have some blood in the urine.   Patient Signature:  ________________________________________________________  Nurse's Signature:  ________________________________________________________

## 2015-05-19 ENCOUNTER — Other Ambulatory Visit: Payer: Self-pay | Admitting: Urology

## 2015-05-19 NOTE — Addendum Note (Signed)
Addendum  created 05/19/15 1008 by Lollie Sails, CRNA   Modules edited: Charges VN

## 2015-05-19 NOTE — Op Note (Signed)
NAMEBUEL, MOLDER                ACCOUNT NO.:  [id number]  MEDICAL RECORD NO.:  [id number]  LOCATION:  [facility]                         FACILITY:  [facility]  PHYSICIAN:  Marshall Cork. Jeffie Pollock, M.D.    DATE OF BIRTH:  04/17/1945  DATE OF PROCEDURE:  05/18/2015 DATE OF DISCHARGE:  05/18/2015                              OPERATIVE REPORT   PROCEDURE:  Cystoscopy with left retrograde pyelogram, insertion of left double-J stent.  PREOPERATIVE DIAGNOSIS:  Left distal ureteral stone with irritative voiding symptoms.  POSTOPERATIVE DIAGNOSIS:  Cystoscopic evidence of acute cystitis with an impacted left distal ureteral stone with obstruction.  SURGEON:  Marshall Cork. Jeffie Pollock, MD  ANESTHESIA:  General.  SPECIMEN:  Left renal urine culture.  DRAINS:  A 6-French x 26 cm left double-J stent.  BLOOD LOSS:  None.  COMPLICATIONS:  None.  INDICATIONS:  Mr. Ngo is a 70 year old, white male, who has multiple left distal ureteral stones with the largest 8 mm approximately 2-3 cm above the meatus.  He presented to the office yesterday with increased irritative voiding symptoms and had some pyuria, but no flank pain.  It was felt that the stones were the most likely cause of the irritation, but a urine culture was obtained and he was started on Septra.  After discussing the options, he elected to have an attempt at ureteroscopy with the understanding if there was pyonephrosis or evidence of infection and that we would place ureteral stent and come back another day.  FINDINGS OF PROCEDURE:  He was taken to the operating room where general anesthetic was induced.  He had been given p.o. sulfa at the office visit and was given Ancef today.  He was given a general anesthetic and placed in lithotomy position.  He was fitted with PAS hose.  His perineum and genitalia were prepped with Betadine solution.  He was draped in usual sterile fashion.  Cystoscopy was performed using the 23-French scope and 30-degree  lens. Examination revealed a normal urethra.  The external sphincter was intact.  The prostatic urethra was short with bilobar hyperplasia with mild obstruction.  Examination of bladder revealed mild trabeculation. On the floor of the prostate, there were diffuse changes that were most consistent with acute cystitis and possible more chronic follicular cystitis-type changes.  The ureteral orifices were unremarkable.  Once a thorough inspection was performed, a 5-French open-end catheter was passed into the left ureteral orifice and cystogram was instilled. This demonstrated a tight distal ureter with the impacted stone, some proximal dilation.  I was unable get the open-end catheter by the stone, but Sensor guidewire was able to pass, but it was very tight and required fair amount of pressure to get it by the stone into the collecting system.  The open-end catheter was then inserted over the wire once again with some resistance into the proximal ureter and the wire was removed. There was a brisk hydronephrotic drip confirming intraureteral positioning.  A specimen was collected for culture.  At this point, because of the densely impacted nature of the stone and the fact that there was evidence of a recent urinary infection with a culture still pending, I felt the most  appropriate thing would be to place a stent and return at a later date for definitive procedure.  The open-end catheter was then re-wired and the catheter was removed.  A 6-French 26 cm double-J stent without tether was passed over the wire to the kidney under fluoroscopic guidance.  Some pressure was required to get it by the stone, but it did go without significant difficulty into the renal collecting system.  The wire was removed leaving good coil in the kidney and a good coil in the bladder.  The bladder was drained.  The patient was taken down from lithotomy position.  His anesthetic was reversed and he was moved  to recovery room in stable condition.  He will be discharged home with the stent and will be scheduled for subsequent procedure in the next 1-2 weeks.     Marshall Cork. Jeffie Pollock, M.D.     JJW/MEDQ  D:  05/18/2015  T:  05/19/2015  Job:  [id number]

## 2015-05-20 LAB — URINE CULTURE: Culture: 60000 — AB

## 2015-05-24 ENCOUNTER — Encounter (HOSPITAL_BASED_OUTPATIENT_CLINIC_OR_DEPARTMENT_OTHER): Payer: Self-pay | Admitting: *Deleted

## 2015-05-25 ENCOUNTER — Encounter (HOSPITAL_BASED_OUTPATIENT_CLINIC_OR_DEPARTMENT_OTHER): Payer: Self-pay | Admitting: *Deleted

## 2015-05-25 NOTE — Progress Notes (Signed)
REVIEWING PT CHART AND NOTED PT HAD ISSUE W/ EXTUBATING IN OR 05-18-2015 SURGERY AND DR DENENNY MDA WAS THE ANESTHESIOLOGIST .  CALLED AND SPOKE W/ DR Baltic MDA VIA PHONE TO REVIEW CHART AND WAS GIVEN THE OK TO PROCEED.

## 2015-05-26 ENCOUNTER — Encounter (HOSPITAL_BASED_OUTPATIENT_CLINIC_OR_DEPARTMENT_OTHER): Payer: Self-pay | Admitting: *Deleted

## 2015-05-26 NOTE — Progress Notes (Addendum)
NPO AFTER MN.  ARRIVE AT 0715.  CURRENT LAB RESULT IN CHART AND EPIC.   WILL TAKE FLOMAX AND PROTONIX AM DOS W/ SIPS OF WATER AND IF NEEDED TAKE OXYCODONE.

## 2015-05-31 NOTE — H&P (Signed)
Chief Complaint urgency   Active Problems Problems  1. Benign prostatic hypertrophy (BPH) with incomplete bladder emptying (N40.1,R39.14) 2. Calculus of distal left ureter (N20.1) 3. Microscopic hematuria (R31.29) 4. Nephrolithiasis (N20.0) 5. Pyuria (N39.0)  History of Present Illness Austin Yu returns today in f/u with increased voiding symptoms with constant urgency and mild dysuria. He has a left distal ureteral stone that was seen on CT on 4/26 done for hematuria.  He has no left flank pain.  The voiding symptoms started about Weds of last week. He has no fever. He has no nausea. He started flomax yesterday and had some nausea and lightheadedness.   Past Medical History Problems  1. History of esophageal reflux (Z87.19)  Surgical History Problems  1. History of No Surgical Problems  Current Meds 1. Famotidine 20 MG Oral Tablet;  Therapy: (Recorded:12Apr2017) to Recorded 2. Pantoprazole Sodium 40 MG Oral Tablet Delayed Release;  Therapy: (Recorded:12Apr2017) to Recorded 3. Tamsulosin HCl - 0.4 MG Oral Capsule; TAKE 1 CAPSULE Daily;  Therapy: 28Apr2017 to (Evaluate:28May2017)  Requested for: 28Apr2017; Last  Rx:28Apr2017 Ordered  Allergies Medication  1. No Known Drug Allergies  Family History Problems  1. Family history of Death of family member : Father 2. Family history of congestive heart failure (Z82.49) : Father 3. No pertinent family history : Mother, Sibling  Social History Problems  1. Alcohol use (Z78.9)   one glass/beer once or twice a week 2. Never a smoker 3. No caffeine use 4. Number of children   2 daughters 5. Single  Review of Systems  Genitourinary: urinary frequency, feelings of urinary urgency and dysuria.  Gastrointestinal: nausea, but no flank pain.  Constitutional: no fever.  Cardiovascular: no chest pain.  Respiratory: no shortness of breath.    Physical Exam Constitutional: Well nourished and well developed . No acute distress.   Neck: The appearance of the neck is normal and no neck mass is present.  Pulmonary: No respiratory distress and normal respiratory rhythm and effort.  Cardiovascular: Heart rate and rhythm are normal . No peripheral edema.  Abdomen: The abdomen is soft and nontender. No masses are palpated. No CVA tenderness. No hernias are palpable. No hepatosplenomegaly noted.    Results/Data Urine [Data Includes: Last 1 Day]   [id number]  COLOR YELLOW   APPEARANCE CLEAR   SPECIFIC GRAVITY 1.015   pH 7.5   GLUCOSE NEGATIVE   BILIRUBIN NEGATIVE   KETONE NEGATIVE   BLOOD 3+   PROTEIN NEGATIVE   NITRITE NEGATIVE   LEUKOCYTE ESTERASE 2+   SQUAMOUS EPITHELIAL/HPF NONE SEEN HPF  WBC 20-40 WBC/HPF  RBC 3-10 RBC/HPF  BACTERIA  HPF  CRYSTALS NONE SEEN HPF  CASTS NONE SEEN LPF  Yeast NONE SEEN HPF   The following images/tracing/specimen were independently visualized:  CT films and KUB reviewed.  The following clinical lab reports were reviewed:  UA reviewed.  PVR: Ultrasound PVR 162 ml. Selected Results  AU CT-HEMATURIA PROTOCOL 26Apr2017 12:00AM Jiles Crocker   Test Name Result Flag Reference  AU CT-HEMATURIA PROTOCOL (Report)    ** RADIOLOGY REPORT BY [facility] **   CLINICAL DATA: Microhematuria, history of renal stones  EXAM: CT ABDOMEN AND PELVIS WITHOUT AND WITH CONTRAST  TECHNIQUE: Multidetector CT imaging of the abdomen and pelvis was performed following the standard protocol before and following the bolus administration of intravenous contrast.  CONTRAST: 125 mL Isovue 300 IV  COMPARISON: 07/11/2005  FINDINGS: Lower chest: Subpleural reticulation/fibrosis with emphysematous changes at the lung bases.  Hepatobiliary:  Liver is within normal limits. No suspicious/enhancing hepatic lesions.  Two layering gallstones measuring up to 1.4 cm. Additional 1.7 cm gallstone in the gallbladder fundus. No associated inflammatory changes. No intrahepatic or extrahepatic  ductal dilatation.  Pancreas: Within normal limits.  Spleen: Within normal limits.  Adrenals/Urinary Tract: Adrenal glands are within normal limits.  Numerous bilateral nonobstructing renal calculi, measuring up to 10 mm in the lateral left upper pole (series 2/ image 39). Moderate left hydroureteronephrosis. Two associated distal left ureteral calculi measuring up to 8 mm (series 4/ image 86), just above the UVJ.  Scattered small bilateral renal cysts, measuring up to 1.4 x 1.9 cm in the lateral interpolar left kidney (series 4/ image 48). No enhancing renal lesions.  On delayed imaging, there are no filling defects in the bilateral opacified proximal collecting systems, ureters, or bladder.  Bladder is mildly thick-walled but underdistended  Stomach/Bowel: Stomach is notable for a small hiatal hernia.  No evidence of bowel obstruction.  Normal appendix (series 4/ image 33).  Sigmoid diverticulosis, without evidence of diverticulitis.  Vascular/Lymphatic: No evidence of abdominal aortic aneurysm.  No suspicious abdominopelvic lymphadenopathy.  Reproductive: Prostatomegaly, with enlargement of the central gland.  Other: No abdominopelvic ascites.  Musculoskeletal: Mild degenerative changes of the visualized thoracolumbar spine.  IMPRESSION: Two distal left ureteral calculi measuring up to 8 mm just above the UVJ. Associated moderate left hydroureteronephrosis.  Numerous additional bilateral nonobstructing renal calculi, measuring up to 10 mm in the left upper pole.  Bilateral renal cysts measuring up to 1.9 cm in the lateral interpolar left kidney. No enhancing renal lesions.  Cholelithiasis, without associated inflammatory changes.  Additional ancillary findings as above.   Electronically Signed  By: Julian Hy M.D.  On: 05/05/2015 09:19   BUN & CREATININE 12Apr2017 03:50PM Jiles Crocker  SPECIMEN TYPE: BLOOD   Test Name Result Flag Reference   CREATININE 1.41 mg/dL  0.50-1.50  BUN 16 mg/dL  7-25  Est GFR, African American 59 mL/min L >=60  Est GFR, NonAfrican American 51 mL/min L >=60  THE ESTIMATED GFR IS A CALCULATION VALID FOR ADULTS (>=73 YEARS OLD) THAT USES THE CKD-EPI ALGORITHM TO ADJUST FOR AGE AND SEX. IT IS   NOT TO BE USED FOR CHILDREN, PREGNANT WOMEN, HOSPITALIZED PATIENTS,    PATIENTS ON DIALYSIS, OR WITH RAPIDLY CHANGING KIDNEY FUNCTION. ACCORDING TO THE NKDEP, EGFR >89 IS NORMAL, 60-89 SHOWS MILD IMPAIRMENT, 30-59 SHOWS MODERATE IMPAIRMENT, 15-29 SHOWS SEVERE IMPAIRMENT AND <15 IS ESRD.   URINE CULTURE 12Apr2017 03:33PM Jiles Crocker  SOURCE : CLEAN CATCH SPECIMEN TYPE: CLEAN CATCH   Test Name Result Flag Reference  CULTURE, URINE Culture, Urine    ===== COLONY COUNT: =====  NO GROWTH   FINAL REPORT: NO GROWTH      KUB today shows about 4 left distal stones with the lead stone about 4m but it doesn't appear to have moved. There do appear to be more stones in the distal ureter than before. He has bilateral renal stones with upper and lower pole left renal stones. He has no other significant bone, gas or soft tissue abnormalities.  Assessment Assessed  1. Calculus of distal left ureter (N20.1) 2. Nephrolithiasis (N20.0) 3. Pyuria (N39.0) 4. Benign prostatic hypertrophy (BPH) with incomplete bladder emptying (N40.1,R39.14)  Left distal stones with increased irritative symptoms and pyuria.  He could have a UTI but with his recent negative culture the symptoms are probably just from the stones.   Plan  Calculus of distal left ureter  1. Start: Oxycodone-Acetaminophen 5-325 MG Oral Tablet; take 1 or 2 tablets q 4-6 hours  prn pain 2. Start: Sulfamethoxazole-Trimethoprim 800-160 MG Oral Tablet; TAKE 1 TABLET BID 3. Follow-up Schedule Surgery Office  Follow-up  Status: Hold For - Appointment   Requested for: [id number] 4. KUB; Status:Resulted - Requires Verification;   Done: [id number] 01:38PM Health  Maintenance  5. UA With REFLEX; [Do Not Release]; Status:Resulted - Requires Verification;   Done:  [id number] 12:18PM Microscopic hematuria, Nephrolithiasis  6. Cysto; Status:Canceled - Appointment,Date of Service;   Urine culture today and I will start bactrim pending the culture to be safe.  I will get him set up for later this week for left ureteroscopy with holmium and stenting.  I reviewed the risks in detail including the need for a stent and possible secondary procedures.  I have given him samples of uribel and suggested he try azo for the irritative symptoms.  He will continue the tamsulosin and was given a script for percocet.      URINE CULTURE; Status:In Progress - Specimen/Data Collected;  Done: [id number]  Perform:[facility]; VIF:[id number]; Marked Important; Last Updated By:Kay, Debbie; 05/17/2015 1:29:56 PM;Ordered; Today;   DYJ:[id number]; Ordered UK:RCVKF, ;

## 2015-06-01 ENCOUNTER — Encounter (HOSPITAL_BASED_OUTPATIENT_CLINIC_OR_DEPARTMENT_OTHER): Payer: Self-pay | Admitting: Anesthesiology

## 2015-06-01 ENCOUNTER — Ambulatory Visit (HOSPITAL_BASED_OUTPATIENT_CLINIC_OR_DEPARTMENT_OTHER)
Admission: RE | Admit: 2015-06-01 | Discharge: 2015-06-01 | Disposition: A | Discharge status: Home / Self Care | Payer: PPO | Source: Ambulatory Visit | Attending: Urology | Admitting: Urology

## 2015-06-01 ENCOUNTER — Encounter (HOSPITAL_BASED_OUTPATIENT_CLINIC_OR_DEPARTMENT_OTHER)
Admission: RE | Disposition: A | Discharge status: Home / Self Care | Payer: Self-pay | Source: Ambulatory Visit | Attending: Urology

## 2015-06-01 ENCOUNTER — Ambulatory Visit (HOSPITAL_BASED_OUTPATIENT_CLINIC_OR_DEPARTMENT_OTHER): Payer: PPO | Admitting: Anesthesiology

## 2015-06-01 DIAGNOSIS — N401 Enlarged prostate with lower urinary tract symptoms: Secondary | ICD-10-CM | POA: Diagnosis not present

## 2015-06-01 DIAGNOSIS — N132 Hydronephrosis with renal and ureteral calculous obstruction: Secondary | ICD-10-CM | POA: Diagnosis not present

## 2015-06-01 DIAGNOSIS — R3915 Urgency of urination: Secondary | ICD-10-CM | POA: Diagnosis not present

## 2015-06-01 DIAGNOSIS — M1990 Unspecified osteoarthritis, unspecified site: Secondary | ICD-10-CM | POA: Insufficient documentation

## 2015-06-01 DIAGNOSIS — K219 Gastro-esophageal reflux disease without esophagitis: Secondary | ICD-10-CM | POA: Insufficient documentation

## 2015-06-01 DIAGNOSIS — N390 Urinary tract infection, site not specified: Secondary | ICD-10-CM | POA: Insufficient documentation

## 2015-06-01 DIAGNOSIS — R3914 Feeling of incomplete bladder emptying: Secondary | ICD-10-CM | POA: Diagnosis not present

## 2015-06-01 DIAGNOSIS — Z79899 Other long term (current) drug therapy: Secondary | ICD-10-CM | POA: Diagnosis not present

## 2015-06-01 DIAGNOSIS — N200 Calculus of kidney: Secondary | ICD-10-CM | POA: Diagnosis not present

## 2015-06-01 DIAGNOSIS — N201 Calculus of ureter: Secondary | ICD-10-CM | POA: Diagnosis not present

## 2015-06-01 HISTORY — DX: Other specified cough: R05.8

## 2015-06-01 HISTORY — DX: Benign prostatic hyperplasia without lower urinary tract symptoms: N40.0

## 2015-06-01 HISTORY — PX: CYSTOSCOPY WITH URETEROSCOPY, STONE BASKETRY AND STENT PLACEMENT: SHX6378

## 2015-06-01 HISTORY — DX: Cough: R05

## 2015-06-01 HISTORY — DX: Other complications of anesthesia, initial encounter: T88.59XA

## 2015-06-01 HISTORY — DX: Calculus of ureter: N20.1

## 2015-06-01 HISTORY — PX: CYSTOSCOPY WITH HOLMIUM LASER LITHOTRIPSY: SHX6639

## 2015-06-01 HISTORY — DX: Gastro-esophageal reflux disease without esophagitis: K21.9

## 2015-06-01 HISTORY — DX: Personal history of urinary calculi: Z87.442

## 2015-06-01 HISTORY — DX: Pulmonary fibrosis, unspecified: J84.10

## 2015-06-01 HISTORY — DX: Adverse effect of unspecified anesthetic, initial encounter: T41.45XA

## 2015-06-01 SURGERY — CYSTOSCOPY, WITH CALCULUS MANIPULATION OR REMOVAL
Anesthesia: General | Laterality: Left

## 2015-06-01 MED ORDER — SODIUM CHLORIDE 0.9 % IR SOLN
Status: DC | PRN
  Administered 2015-06-01: 3000 mL via INTRAVESICAL

## 2015-06-01 MED ORDER — LIDOCAINE HCL (CARDIAC) 20 MG/ML IV SOLN
INTRAVENOUS | Status: DC | PRN
  Administered 2015-06-01: 80 mg via INTRAVENOUS

## 2015-06-01 MED ORDER — LIDOCAINE 2% (20 MG/ML) 5 ML SYRINGE: INTRAMUSCULAR | Status: DC | PRN

## 2015-06-01 MED ORDER — PROPOFOL 10 MG/ML IV BOLUS
INTRAVENOUS | Status: DC | PRN
  Administered 2015-06-01: 50 mg via INTRAVENOUS
  Administered 2015-06-01: 200 mg via INTRAVENOUS
  Administered 2015-06-01: 100 mg via INTRAVENOUS

## 2015-06-01 MED ORDER — BELLADONNA ALKALOIDS-OPIUM 16.2-60 MG RE SUPP
RECTAL | Status: DC | PRN
  Administered 2015-06-01: 1 via RECTAL

## 2015-06-01 MED ORDER — ONDANSETRON HCL 4 MG/2ML IJ SOLN
INTRAMUSCULAR | Status: DC | PRN
  Administered 2015-06-01: 4 mg via INTRAVENOUS

## 2015-06-01 MED ORDER — BELLADONNA ALKALOIDS-OPIUM 16.2-60 MG RE SUPP
RECTAL | Status: AC
  Filled 2015-06-01: qty 1

## 2015-06-01 MED ORDER — OXYCODONE HCL 5 MG PO TABS
5.0000 mg | ORAL_TABLET | Freq: Once | ORAL | Status: DC | PRN
  Filled 2015-06-01: qty 1

## 2015-06-01 MED ORDER — PHENYLEPHRINE HCL 10 MG/ML IJ SOLN
INTRAMUSCULAR | Status: DC | PRN
  Administered 2015-06-01: 80 ug via INTRAVENOUS
  Administered 2015-06-01: 120 ug via INTRAVENOUS
  Administered 2015-06-01 (×3): 80 ug via INTRAVENOUS
  Administered 2015-06-01: 40 ug via INTRAVENOUS

## 2015-06-01 MED ORDER — LACTATED RINGERS IV SOLN
INTRAVENOUS | Status: DC
  Administered 2015-06-01 (×2): via INTRAVENOUS
  Filled 2015-06-01: qty 1000

## 2015-06-01 MED ORDER — ACETAMINOPHEN 650 MG RE SUPP
650.0000 mg | RECTAL | Status: DC | PRN
  Filled 2015-06-01: qty 1

## 2015-06-01 MED ORDER — ACETAMINOPHEN 160 MG/5ML PO SOLN
325.0000 mg | ORAL | Status: DC | PRN
  Filled 2015-06-01: qty 20.3

## 2015-06-01 MED ORDER — FENTANYL CITRATE (PF) 100 MCG/2ML IJ SOLN
INTRAMUSCULAR | Status: DC | PRN
  Administered 2015-06-01 (×3): 25 ug via INTRAVENOUS

## 2015-06-01 MED ORDER — DEXAMETHASONE SODIUM PHOSPHATE 4 MG/ML IJ SOLN
INTRAMUSCULAR | Status: DC | PRN
  Administered 2015-06-01: 10 mg via INTRAVENOUS

## 2015-06-01 MED ORDER — SODIUM CHLORIDE 0.9 % IV SOLN
250.0000 mL | INTRAVENOUS | Status: DC | PRN
  Filled 2015-06-01: qty 250

## 2015-06-01 MED ORDER — MIDAZOLAM HCL 5 MG/5ML IJ SOLN
INTRAMUSCULAR | Status: DC | PRN
Start: 2015-06-01 — End: 2015-06-01
  Administered 2015-06-01: 2 mg via INTRAVENOUS

## 2015-06-01 MED ORDER — OXYCODONE-ACETAMINOPHEN 5-325 MG PO TABS: 1.0000 | ORAL_TABLET | Freq: Four times a day (QID) | ORAL | Status: DC | PRN

## 2015-06-01 MED ORDER — ACETAMINOPHEN 325 MG PO TABS
650.0000 mg | ORAL_TABLET | ORAL | Status: DC | PRN
  Filled 2015-06-01: qty 2

## 2015-06-01 MED ORDER — OXYCODONE HCL 5 MG PO TABS
5.0000 mg | ORAL_TABLET | ORAL | Status: DC | PRN
  Filled 2015-06-01: qty 2

## 2015-06-01 MED ORDER — SODIUM CHLORIDE 0.9% FLUSH
3.0000 mL | Freq: Two times a day (BID) | INTRAVENOUS | Status: DC
  Filled 2015-06-01: qty 3

## 2015-06-01 MED ORDER — ACETAMINOPHEN 325 MG PO TABS
325.0000 mg | ORAL_TABLET | ORAL | Status: DC | PRN
  Filled 2015-06-01: qty 2

## 2015-06-01 MED ORDER — CEFAZOLIN SODIUM-DEXTROSE 2-4 GM/100ML-% IV SOLN
2.0000 g | INTRAVENOUS | Status: AC
  Administered 2015-06-01: 2 g via INTRAVENOUS
  Filled 2015-06-01: qty 100

## 2015-06-01 MED ORDER — OXYCODONE HCL 5 MG/5ML PO SOLN
5.0000 mg | Freq: Once | ORAL | Status: DC | PRN
  Filled 2015-06-01: qty 5

## 2015-06-01 MED ORDER — FENTANYL CITRATE (PF) 100 MCG/2ML IJ SOLN
25.0000 ug | INTRAMUSCULAR | Status: DC | PRN
Start: 2015-06-01 — End: 2015-06-01
  Filled 2015-06-01: qty 1

## 2015-06-01 MED ORDER — SODIUM CHLORIDE 0.9% FLUSH
3.0000 mL | INTRAVENOUS | Status: DC | PRN
  Filled 2015-06-01: qty 3

## 2015-06-01 MED ORDER — FENTANYL CITRATE (PF) 100 MCG/2ML IJ SOLN
25.0000 ug | INTRAMUSCULAR | Status: DC | PRN
  Filled 2015-06-01: qty 1

## 2015-06-01 MED ORDER — ALBUTEROL SULFATE (2.5 MG/3ML) 0.083% IN NEBU
2.5000 mg | INHALATION_SOLUTION | Freq: Four times a day (QID) | RESPIRATORY_TRACT | Status: DC | PRN
  Administered 2015-06-01: 2.5 mg via RESPIRATORY_TRACT
  Filled 2015-06-01: qty 3

## 2015-06-01 SURGICAL SUPPLY — 49 items
BAG DRAIN URO-CYSTO SKYTR STRL (DRAIN) ×3 IMPLANT
BAG DRN UROCATH (DRAIN) ×2
BASKET DAKOTA 1.9FR 11X120 (BASKET) ×2 IMPLANT
BASKET LASER NITINOL 1.9FR (BASKET) IMPLANT
BASKET STNLS GEMINI 4WIRE 3FR (BASKET) IMPLANT
BASKET ZERO TIP NITINOL 2.4FR (BASKET) IMPLANT
BSKT STON RTRVL 120 1.9FR (BASKET)
BSKT STON RTRVL GEM 120X11 3FR (BASKET)
BSKT STON RTRVL ZERO TP 2.4FR (BASKET)
CANISTER SUCT LVC 12 LTR MEDI- (MISCELLANEOUS) IMPLANT
CATH FOLEY 2WAY SLVR  5CC 16FR (CATHETERS)
CATH FOLEY 2WAY SLVR 5CC 16FR (CATHETERS) IMPLANT
CATH ROBINSON RED A/P 16FR (CATHETERS) ×2 IMPLANT
CATH URET 5FR 28IN CONE TIP (BALLOONS)
CATH URET 5FR 28IN OPEN ENDED (CATHETERS) ×2 IMPLANT
CATH URET 5FR 70CM CONE TIP (BALLOONS) IMPLANT
CLOTH BEACON ORANGE TIMEOUT ST (SAFETY) ×3 IMPLANT
ELECT REM PT RETURN 9FT ADLT (ELECTROSURGICAL) ×3
ELECTRODE REM PT RTRN 9FT ADLT (ELECTROSURGICAL) ×2 IMPLANT
FIBER LASER FLEXIVA 1000 (UROLOGICAL SUPPLIES) IMPLANT
FIBER LASER FLEXIVA 200 (UROLOGICAL SUPPLIES) ×2 IMPLANT
FIBER LASER FLEXIVA 365 (UROLOGICAL SUPPLIES) ×2 IMPLANT
FIBER LASER FLEXIVA 550 (UROLOGICAL SUPPLIES) IMPLANT
FIBER LASER TRAC TIP (UROLOGICAL SUPPLIES) IMPLANT
GLOVE SURG SS PI 8.0 STRL IVOR (GLOVE) ×3 IMPLANT
GOWN STRL REUS W/ TWL LRG LVL3 (GOWN DISPOSABLE) ×2 IMPLANT
GOWN STRL REUS W/ TWL XL LVL3 (GOWN DISPOSABLE) ×2 IMPLANT
GOWN STRL REUS W/TWL LRG LVL3 (GOWN DISPOSABLE) ×3
GOWN STRL REUS W/TWL XL LVL3 (GOWN DISPOSABLE) ×3
GUIDEWIRE 0.038 PTFE COATED (WIRE) IMPLANT
GUIDEWIRE ANG ZIPWIRE 038X150 (WIRE) IMPLANT
GUIDEWIRE STR DUAL SENSOR (WIRE) ×3 IMPLANT
IV NS IRRIG 3000ML ARTHROMATIC (IV SOLUTION) ×6 IMPLANT
KIT BALLIN UROMAX 15FX10 (LABEL) IMPLANT
KIT BALLN UROMAX 15FX4 (MISCELLANEOUS) IMPLANT
KIT BALLN UROMAX 26 75X4 (MISCELLANEOUS)
KIT ROOM TURNOVER WOR (KITS) ×3 IMPLANT
MANIFOLD NEPTUNE II (INSTRUMENTS) IMPLANT
NDL SAFETY ECLIPSE 18X1.5 (NEEDLE) IMPLANT
NEEDLE HYPO 18GX1.5 SHARP (NEEDLE)
NEEDLE HYPO 22GX1.5 SAFETY (NEEDLE) IMPLANT
NS IRRIG 500ML POUR BTL (IV SOLUTION) IMPLANT
PACK CYSTO (CUSTOM PROCEDURE TRAY) ×3 IMPLANT
SET HIGH PRES BAL DIL (LABEL)
SHEATH ACCESS URETERAL 38CM (SHEATH) ×2 IMPLANT
STENT URET 6FRX26 CONTOUR (STENTS) ×2 IMPLANT
SYR 20CC LL (SYRINGE) IMPLANT
TUBE CONNECTING 12X1/4 (SUCTIONS) IMPLANT
WATER STERILE IRR 3000ML UROMA (IV SOLUTION) ×3 IMPLANT

## 2015-06-01 NOTE — Brief Op Note (Signed)
06/01/2015  9:53 AM  PATIENT:  Austin Yu  70 y.o. male  PRE-OPERATIVE DIAGNOSIS:  LEFT DISTAL STONES  POST-OPERATIVE DIAGNOSIS:  LEFT DISTAL STONES AND LEFT RENAL STONES  PROCEDURE:  Procedure(s): CYSTOSCOPY WITH URETEROSCOPY, STONE BASKETRY AND STENT PLACEMENT (Left) CYSTOSCOPY WITH HOLMIUM LASER LITHOTRIPSY (Left) FOR DISTAL URETERAL AND RENAL STONES.  SURGEON:  Surgeon(s) and Role:    * Irine Seal, MD - Primary  PHYSICIAN ASSISTANT:   ASSISTANTS: none   ANESTHESIA:   general  EBL:  Total I/O In: 1000 [I.V.:1000] Out: -   BLOOD ADMINISTERED:none  DRAINS: 6 x 26 left JJ stent   LOCAL MEDICATIONS USED:  LIDOCAINE  and Amount: 10 ml 2% Jelly  SPECIMEN:  Source of Specimen:  stone fragments from distal ureter and left upper pole  DISPOSITION OF SPECIMEN:  To office for analysis.   COUNTS:  YES  TOURNIQUET:  * No tourniquets in log *  DICTATION: .Other Dictation: Dictation Number [id number]  PLAN OF CARE: Discharge to home after PACU  PATIENT DISPOSITION:  PACU - hemodynamically stable.   Delay start of Pharmacological VTE agent (>24hrs) due to surgical blood loss or risk of bleeding: not applicable

## 2015-06-01 NOTE — Op Note (Signed)
Yu, Austin                ACCOUNT NO.:  [id number]  MEDICAL RECORD NO.:  [id number]  LOCATION:                               FACILITY:  [facility]  PHYSICIAN:  Marshall Cork. Jeffie Pollock, M.D.    DATE OF BIRTH:  25-Sep-1945  DATE OF PROCEDURE:  06/01/2015 DATE OF DISCHARGE:                              OPERATIVE REPORT   PROCEDURE:  Cystoscopy with removal of left double-J stent, left ureteroscopy with holmium lasertripsy of multiple left distal ureteral stones and a left upper pole stone with placement of left double-J stent.  PREOPERATIVE DIAGNOSIS:  Left ureteral stones.  POSTOPERATIVE DIAGNOSIS:  Left ureteral stones with left renal stone.  SURGEON:  Marshall Cork. Jeffie Pollock, MD  ANESTHESIA:  General.  SPECIMEN:  Stone fragments.  DRAINS:  A 6-French x 26 cm left double-J stent.  BLOOD LOSS:  None.  COMPLICATIONS:  None.  INDICATIONS:  Mr. Villalpando is a 70 year old, white male, with multiple left distal ureteral stones, the largest was 8 mm.  He had renal stones as well in the lower and upper pole.  He had previously undergone placement of the stent and was found to have some pyonephrosis, additionally, he had erythema of the bladder, primarily adjacent to the ureteral orifice on the left on the floor and trigone.  He has been treated with antibiotics.  His culture eventually grew an enterococcus.  FINDINGS OF PROCEDURE:  He was given Ancef.  He was taken to the operating room where general anesthetic was induced.  He was placed in lithotomy position.  His perineum and genitalia were prepped with Betadine solution, he was draped in usual sterile fashion.  He had been fitted with PAS hose.  A 23-French cystoscope was passed without difficulty.  Examination revealed a normal urethra.  The external sphincter was intact.  Prostatic urethra was approximately 3 cm in length with bilobar hyperplasia and some coaptation.  Examination of the bladder revealed resolution of the erythema in the left  base and posterior wall and a stent at the left ureteral orifice.  The remainder of the cystoscopy was unremarkable.  The stent was grasped with the grasping forceps and pulled the urethral meatus and a guidewire was then advanced to the kidney through the stent.  The stent was removed.  A 6.5-French dual-lumen semi-rigid ureteroscope was then passed alongside the wire into the ureteral orifice.  The lead stone was identified, it was impacted and at an angle which was difficult to initially negotiate, but I was able to engage the stone with a 365 micron laser fiber, set on 0.5 hertz and initially 0.8 watts and 20 hertz.  Eventually, the frequency was increased to 50.  The stone was fragmented into manageable fragments.  The fragments were then removed with a [place] basket leaving only small sand.  At this point, because of the degree of difficulty with ureteroscopy and inflammation associated with it, I felt it would be most appropriate to prophylactically treat the stones in the kidney.  So, the semi-rigid scope was removed and a 14-French digital access sheath was passed to the kidney.  The wire and inner core were removed and a dual-lumen digital flexible were  passed to the kidney.  Inspection revealed an adherent stone in the upper pole consistent with a visible stone on fluoroscopy.  There was also stone in the lower pole, but on inspection, the stone was within a calyx with a very narrow infundibulum and was not readily accessible.  It was felt that no treatment was indicated for that particular stone.  However, the upper pole stone did appear to require treatment.  A 200 micron Trac-Master laser fiber was then passed through the scope and the upper pole stone was engaged and broken into manageable fragments, then removed with the [place] basket.  After all significant visible fragments were removed, there was a persistent stone shadow on the film, but this appeared to be a  deeply imbedded stone that I could not access.  At this point, the ureteroscope was removed and a guidewire was replaced to the kidney.  The access sheath was removed and the cystoscope was reinserted over the wire.  A 26 cm 6-French Contour double-J stent was then inserted into kidney under fluoroscopic guidance.  The wire was removed leaving good coil in the kidney and good coil in the bladder. The stone fragments that had been relocated to the bladder were then evacuated from the bladder and the bladder was drained.  The cystoscope was removed.  The urethra was instilled with 10 mL of 2% lidocaine jelly and a B and O suppository was placed.  The stone fragments were collected to take to the office for analysis.  The patient was taken down from lithotomy position.  His anesthetic was reversed.  He was moved to recovery room in stable condition.  There were no complications.     Marshall Cork. Jeffie Pollock, M.D.   ______________________________ Marshall Cork. Jeffie Pollock, M.D.    JJW/MEDQ  D:  06/01/2015  T:  06/01/2015  Job:  [id number]

## 2015-06-01 NOTE — Anesthesia Postprocedure Evaluation (Signed)
Anesthesia Post Note  Patient: Austin Yu  Procedure(s) Performed: Procedure(s) (LRB): CYSTOSCOPY WITH URETEROSCOPY, STONE BASKETRY AND STENT PLACEMENT (Left) CYSTOSCOPY WITH HOLMIUM LASER LITHOTRIPSY (Left)  Patient location during evaluation: PACU Anesthesia Type: General Level of consciousness: awake and alert and patient cooperative Pain management: pain level controlled Vital Signs Assessment: post-procedure vital signs reviewed and stable Respiratory status: spontaneous breathing Cardiovascular status: stable Postop Assessment: no signs of nausea or vomiting Anesthetic complications: no Comments: Patient with pulmonary fibrosis, strong cough reflex and hyperactive upper airway. Tolerated deep general anesthesia with intermittent coughing and breath holds through out. Would consider pre treatment with albuterol neb (even without wheezing) and nebulized lidocaine to decrease airway hyperactivity and upper airway reflexes.    Last Vitals:  Filed Vitals:   06/01/15 1045 06/01/15 1100  BP: 114/66 104/68  Pulse: 75 71  Temp:    Resp: 16 12    Last Pain:  Filed Vitals:   06/01/15 1120  PainSc: 0-No pain                 [name]

## 2015-06-01 NOTE — Discharge Instructions (Addendum)
CYSTOSCOPY HOME CARE INSTRUCTIONS  Activity: Rest for the remainder of the day.  Do not drive or operate equipment today.  You may resume normal activities in one to two days as instructed by your physician.   Meals: Drink plenty of liquids and eat light foods such as gelatin or soup this evening.  You may return to a normal meal plan tomorrow.  Return to Work: You may return to work in one to two days or as instructed by your physician.  Special Instructions / Symptoms: Call your physician if any of these symptoms occur:   -persistent or heavy bleeding  -bleeding which continues after first few urination  -large blood clots that are difficult to pass  -urine stream diminishes or stops completely  -fever equal to or higher than 101 degrees Farenheit.  -cloudy urine with a strong, foul odor  -severe pain  Females should always wipe from front to back after elimination.  You may feel some burning pain when you urinate.  This should disappear with time.  Applying moist heat to the lower abdomen or a hot tub bath may help relieve the pain. \  I replaced the stent because of inflammatory changes related to the impacted stones and will want to leave that for 2-3 weeks to allow for proper healing.   I will be altering your post op appoint to set up stent removal in the office.  Please bring your stone fragments to the office for analysis.   Patient Signature:  ________________________________________________________  Nurse's Signature:  ________________________________________________________  Post Anesthesia Home Care Instructions  Activity: Get plenty of rest for the remainder of the day. A responsible adult should stay with you for 24 hours following the procedure.  For the next 24 hours, DO NOT: -Drive a car -Paediatric nurse -Drink alcoholic beverages -Take any medication unless instructed by your physician -Make any legal decisions or sign important papers.  Meals: Start  with liquid foods such as gelatin or soup. Progress to regular foods as tolerated. Avoid greasy, spicy, heavy foods. If nausea and/or vomiting occur, drink only clear liquids until the nausea and/or vomiting subsides. Call your physician if vomiting continues.  Special Instructions/Symptoms: Your throat may feel dry or sore from the anesthesia or the breathing tube placed in your throat during surgery. If this causes discomfort, gargle with warm salt water. The discomfort should disappear within 24 hours.  If you had a scopolamine patch placed behind your ear for the management of post- operative nausea and/or vomiting:  1. The medication in the patch is effective for 72 hours, after which it should be removed.  Wrap patch in a tissue and discard in the trash. Wash hands thoroughly with soap and water. 2. You may remove the patch earlier than 72 hours if you experience unpleasant side effects which may include dry mouth, dizziness or visual disturbances. 3. Avoid touching the patch. Wash your hands with soap and water after contact with the patch.

## 2015-06-01 NOTE — Anesthesia Preprocedure Evaluation (Addendum)
Anesthesia Evaluation  Patient identified by MRN, date of birth, ID band Patient awake    History of Anesthesia Complications (+) history of anesthetic complications  Airway Mallampati: II  TM Distance: >3 FB Neck ROM: Full    Dental  (+) Dental Advisory Given, Teeth Intact,    Pulmonary  Hx Pulm. Fibrosis No home O2 05-18-15 Chest X ray Bilateral diffuse interstitial thickening. No pleural effusion or pneumothorax. Stable cardiomediastinal silhouette. Right perihilar prominence with adjacent reticulation  No recent changes in cough or breathing, lungs clear today on examination    breath sounds clear to auscultation       Cardiovascular Exercise Tolerance: Good  Rhythm:Regular Rate:Normal     Neuro/Psych negative neurological ROS     GI/Hepatic GERD  Medicated and Controlled,  Endo/Other  negative endocrine ROS  Renal/GU negative Renal ROS     Musculoskeletal  (+) Arthritis , Osteoarthritis,    Abdominal   Peds  Hematology negative hematology ROS (+)   Anesthesia Other Findings   Reproductive/Obstetrics                          Anesthesia Physical Anesthesia Plan  ASA: III  Anesthesia Plan: General   Post-op Pain Management:    Induction: Intravenous  Airway Management Planned: LMA  Additional Equipment: None  Intra-op Plan:   Post-operative Plan: Extubation in OR  Informed Consent: I have reviewed the patients History and Physical, chart, labs and discussed the procedure including the risks, benefits and alternatives for the proposed anesthesia with the patient or authorized representative who has indicated his/her understanding and acceptance.   Dental advisory given  Plan Discussed with: CRNA, Anesthesiologist and Surgeon  Anesthesia Plan Comments:        Anesthesia Quick Evaluation

## 2015-06-01 NOTE — Transfer of Care (Signed)
Immediate Anesthesia Transfer of Care Note  Patient: Austin Yu  Procedure(s) Performed: Procedure(s): CYSTOSCOPY WITH URETEROSCOPY, STONE BASKETRY AND STENT PLACEMENT (Left) CYSTOSCOPY WITH HOLMIUM LASER LITHOTRIPSY (Left)  Patient Location: PACU  Anesthesia Type:General  Level of Consciousness: awake, alert , oriented and patient cooperative  Airway & Oxygen Therapy: Patient Spontanous Breathing and Patient connected to face mask oxygen  Post-op Assessment: Report given to RN and Post -op Vital signs reviewed and stable  Post vital signs: Reviewed and stable  Last Vitals:  Filed Vitals:   06/01/15 0731 06/01/15 1007  BP: 112/75 118/74  Pulse: 75 77  Temp: 36.5 C 36.4 C  Resp: 16 20    Last Pain: There were no vitals filed for this visit.    Patients Stated Pain Goal: 5 (93/11/21 6244)  Complications: No apparent anesthesia complications

## 2015-06-01 NOTE — Anesthesia Procedure Notes (Signed)
Procedure Name: LMA Insertion Date/Time: 06/01/2015 8:49 AM Performed by: Wanita Chamberlain Pre-anesthesia Checklist: Patient identified, Timeout performed, Emergency Drugs available, Suction available and Patient being monitored Patient Re-evaluated:Patient Re-evaluated prior to inductionOxygen Delivery Method: Circle system utilized Preoxygenation: Pre-oxygenation with 100% oxygen Intubation Type: IV induction Ventilation: Mask ventilation without difficulty LMA: LMA inserted LMA Size: 5.0 Number of attempts: 1 Intubation method: Gauze airway placed between central incisors. Placement Confirmation: breath sounds checked- equal and bilateral and positive ETCO2 Tube secured with: Tape Dental Injury: Teeth and Oropharynx as per pre-operative assessment

## 2015-06-01 NOTE — Op Note (Deleted)
NAMEKELON, EASOM                ACCOUNT NO.:  [id number]  MEDICAL RECORD NO.:  [id number]  LOCATION:                               FACILITY:  [facility]  PHYSICIAN:  Marshall Cork. Jeffie Pollock, M.D.    DATE OF BIRTH:  1945/12/02  DATE OF PROCEDURE:  06/01/2015 DATE OF DISCHARGE:                              OPERATIVE REPORT   PROCEDURE:  Cystoscopy with removal of left double-J stent, left ureteroscopy with holmium lasertripsy of multiple left distal ureteral stones and a left upper pole stone with placement of left double-J stent.  PREOPERATIVE DIAGNOSIS:  Left ureteral stones.  POSTOPERATIVE DIAGNOSIS:  Left ureteral stones with left renal stone.  SURGEON:  Marshall Cork. Jeffie Pollock, MD  ANESTHESIA:  General.  SPECIMEN:  Stone fragments.  DRAINS:  A 6-French x 26 cm left double-J stent.  BLOOD LOSS:  None.  COMPLICATIONS:  None.  INDICATIONS:  Mr. Seminara is a 70 year old, white male, with multiple left distal ureteral stones, the largest was 8 mm.  He had renal stones as well in the lower and upper pole.  He had previously undergone placement of the stent and was found to have some pyonephrosis, additionally, he had erythema of the bladder, primarily adjacent to the ureteral orifice on the left on the floor and trigone.  He has been treated with antibiotics.  His culture eventually grew an enterococcus.  FINDINGS OF PROCEDURE:  He was given Ancef.  He was taken to the operating room where general anesthetic was induced.  He was placed in lithotomy position.  His perineum and genitalia were prepped with Betadine solution, he was draped in usual sterile fashion.  He had been fitted with PAS hose.  A 23-French cystoscope was passed without difficulty.  Examination revealed a normal urethra.  The external sphincter was intact.  Prostatic urethra was approximately 3 cm in length with bilobar hyperplasia and some coaptation.  Examination of the bladder revealed resolution of the erythema in the left  base and posterior wall and a stent at the left ureteral orifice.  The remainder of the cystoscopy was unremarkable.  The stent was grasped with the grasping forceps and pulled the urethral meatus and a guidewire was then advanced to the kidney through the stent.  The stent was removed.  A 6.5-French dual-lumen semi-rigid ureteroscope was then passed alongside the wire into the ureteral orifice.  The lead stone was identified, it was impacted and at an angle which was difficult to initially negotiate, but I was able to engage the stone with a 365 micron laser fiber, set on 0.5 hertz and initially 0.8 watts and 20 hertz.  Eventually, the frequency was increased to 50.  The stone was fragmented into manageable fragments.  The fragments were then removed with a [place] basket leaving only small sand.  At this point, because of the degree of difficulty with ureteroscopy and inflammation associated with it, I felt it would be most appropriate to prophylactically treat the stones in the kidney.  So, the semi-rigid scope was removed and a 14-French digital access sheath was passed to the kidney.  The wire and inner core were removed and a dual-lumen digital flexible were  passed to the kidney.  Inspection revealed an adherent stone in the upper pole consistent with a visible stone on fluoroscopy.  There was also stone in the lower pole, but on inspection, the stone was within a calyx with a very narrow infundibulum and was not readily accessible.  It was felt that no treatment was indicated for that particular stone.  However, the upper pole stone did appear to require treatment.  A 200 micron Trac-Master laser fiber was then passed through the scope and the upper pole stone was engaged and broken into manageable fragments, then removed with the [place] basket.  After all significant visible fragments were removed, there was a persistent stone shadow on the film, but this appeared to be a  deeply imbedded stone that I could not access.  At this point, the ureteroscope was removed and a guidewire was replaced to the kidney.  The access sheath was removed and the cystoscope was reinserted over the wire.  A 26 cm 6-French Contour double-J stent was then inserted into kidney under fluoroscopic guidance.  The wire was removed leaving good coil in the kidney and good coil in the bladder. The stone fragments that had been relocated to the bladder were then evacuated from the bladder and the bladder was drained.  The cystoscope was removed.  The urethra was instilled with 10 mL of 2% lidocaine jelly and a B and O suppository was placed.  The stone fragments were collected to take to the office for analysis.  The patient was taken down from lithotomy position.  His anesthetic was reversed.  He was moved to recovery room in stable condition.  There were no complications.     Marshall Cork. Jeffie Pollock, M.D.   ______________________________ Marshall Cork. Jeffie Pollock, M.D.    JJW/MEDQ  D:  06/01/2015  T:  06/01/2015  Job:  [id number]

## 2015-06-01 NOTE — Interval H&P Note (Signed)
History and Physical Interval Note:  06/01/2015 8:27 AM  Austin Yu  has presented today for surgery, with the diagnosis of LEFT DISTAL STONES  The various methods of treatment have been discussed with the patient and family. After consideration of risks, benefits and other options for treatment, the patient has consented to  Procedure(s): CYSTOSCOPY WITH URETEROSCOPY, STONE BASKETRY AND STENT PLACEMENT (Left) CYSTOSCOPY WITH HOLMIUM LASER LITHOTRIPSY (Left) CYSTOSCOPY WITH POSSIBLE BLADDER BIOPSY (N/A) as a surgical intervention .  The patient's history has been reviewed, patient examined, no change in status, stable for surgery.  I have reviewed the patient's chart and labs.  Questions were answered to the patient's satisfaction.     Ellisa Devivo J

## 2015-06-02 ENCOUNTER — Encounter (HOSPITAL_BASED_OUTPATIENT_CLINIC_OR_DEPARTMENT_OTHER): Payer: Self-pay | Admitting: Urology

## 2015-06-09 DIAGNOSIS — N202 Calculus of kidney with calculus of ureter: Secondary | ICD-10-CM | POA: Diagnosis not present

## 2015-06-14 DIAGNOSIS — N200 Calculus of kidney: Secondary | ICD-10-CM | POA: Diagnosis not present

## 2015-06-22 DIAGNOSIS — N202 Calculus of kidney with calculus of ureter: Secondary | ICD-10-CM | POA: Diagnosis not present

## 2015-06-22 DIAGNOSIS — N200 Calculus of kidney: Secondary | ICD-10-CM | POA: Diagnosis not present

## 2015-06-24 DIAGNOSIS — N200 Calculus of kidney: Secondary | ICD-10-CM | POA: Diagnosis not present

## 2015-10-11 ENCOUNTER — Ambulatory Visit (INDEPENDENT_AMBULATORY_CARE_PROVIDER_SITE_OTHER): Payer: PPO | Admitting: Internal Medicine

## 2015-10-11 ENCOUNTER — Encounter: Payer: Self-pay | Admitting: Internal Medicine

## 2015-10-11 ENCOUNTER — Encounter (INDEPENDENT_AMBULATORY_CARE_PROVIDER_SITE_OTHER): Payer: PPO | Admitting: Internal Medicine

## 2015-10-11 VITALS — BP 122/80 | HR 110 | Ht 67.0 in | Wt 184.0 lb

## 2015-10-11 DIAGNOSIS — R058 Other specified cough: Secondary | ICD-10-CM

## 2015-10-11 DIAGNOSIS — J8410 Pulmonary fibrosis, unspecified: Secondary | ICD-10-CM

## 2015-10-11 DIAGNOSIS — R05 Cough: Secondary | ICD-10-CM

## 2015-10-11 LAB — PULMONARY FUNCTION TEST
DL/VA % PRED: 125 %
DL/VA: 5.53 ml/min/mmHg/L
DLCO COR % PRED: 58 %
DLCO COR: 16.45 ml/min/mmHg
DLCO unc % pred: 54 %
DLCO unc: 15.33 ml/min/mmHg
FEF 25-75 POST: 2.74 L/s
FEF 25-75 Pre: 2.7 L/sec
FEF2575-%CHANGE-POST: 1 %
FEF2575-%PRED-POST: 121 %
FEF2575-%PRED-PRE: 119 %
FEV1-%Change-Post: -2 %
FEV1-%Pred-Post: 68 %
FEV1-%Pred-Pre: 69 %
FEV1-POST: 1.99 L
FEV1-Pre: 2.03 L
FEV1FVC-%CHANGE-POST: 3 %
FEV1FVC-%PRED-PRE: 117 %
FEV6-%CHANGE-POST: -5 %
FEV6-%Pred-Post: 59 %
FEV6-%Pred-Pre: 62 %
FEV6-Post: 2.22 L
FEV6-Pre: 2.34 L
FEV6FVC-%Pred-Post: 106 %
FEV6FVC-%Pred-Pre: 106 %
FVC-%Change-Post: -5 %
FVC-%PRED-POST: 55 %
FVC-%Pred-Pre: 59 %
FVC-PRE: 2.34 L
FVC-Post: 2.22 L
POST FEV1/FVC RATIO: 90 %
PRE FEV1/FVC RATIO: 87 %
Post FEV6/FVC ratio: 100 %
Pre FEV6/FVC Ratio: 100 %
RV % pred: 31 %
RV: 0.71 L
TLC % PRED: 45 %
TLC: 2.94 L

## 2015-10-11 NOTE — Assessment & Plan Note (Signed)
Onset of cough summer 2016 > improved on GERD rx 04/09/2015  -  PFT's  04/09/2015   VC 58%   with DLCO  53/62 % corrects to 111 % for alv volume   -  PFT's  10/11/2015   FVC = 2.34 (59%) DLCO  54/58 % corrects to 125  % for alv volume    Ex tol is improving, cough is almost gone and pfts show no change x 6 m on gerd rx so no need to add anti-fibrotic agents at this point > if ex tol declines will proceed with hRCT next, otherwise follow with serial pfts  Discussed in detail all the  indications, usual  risks and alternatives  relative to the benefits with patient who agrees to proceed with conservative f/u as outlined    Each maintenance medication was reviewed in detail including most importantly the difference between maintenance and as needed and under what circumstances the prns are to be used.  Please see instructions for details which were reviewed in writing and the patient given a copy.  Total time devoted to counseling  = 15/56mn ov spent =  review case with pt/ discussion of options/alternatives/ personally creating written instructions  in presence of pt  then going over those specific  Instructions directly with the pt including how to use all of the meds but in particular covering each new medication in detail and the difference between the maintenance/automatic meds and the prns using an action plan format for the latter.

## 2015-10-11 NOTE — Patient Instructions (Signed)
Please schedule a follow up visit in 6 months but call sooner if needed with pfts

## 2015-10-11 NOTE — Progress Notes (Signed)
Subjective:    Patient ID: DMARION PERFECT, male    DOB: [birth year]     MRN: [id number]    Brief patient profile:  47 yowm never smoker with cough onset summer 2016 eval with cxr by Dr Shelia Media who referred him to pulmonary clinic 02/01/2015 with abn cxr ? PF.    History of Present Illness  02/01/2015 1st [facility] office visit/ Shykeem Resurreccion   Chief Complaint  Patient presents with  . Pulmonary Consult    Referred by Dr Shelia Media for abn cxr.   indolent onset dry persistent daily cough x 6 m worse at hs   / worse with laugh /use of voice / no h/o sign arthritis/ amio or chemo exposures rec Pantoprazole (protonix) 40 mg   Take  30-60 min before first meal of the day and Pepcid (famotidine)  20 mg one @  bedtime until return to office   GERD diet    04/09/2015  f/u ov/Brandley Aldrete re: PF on strictly gerd rx  Chief Complaint  Patient presents with  . Follow-up    PFT done today. Cough has improved. No new co's today.   able to do gxt 3 x week x grade 1/ 3.61mh x 2 miles in 30 min and feels good Cough much better / no arthritis hx or unusual exp hx  rec Increase your speed to where you a little short of breath never out of breath  Don't change diet or reflex meds for 6 months Please remember to go to the  x-ray department downstairs for your tests - we will call you with the results when they are available. Please schedule a follow up visit in 6  months but call sooner if needed with pfts on return    10/11/2015  f/u ov/Raeden Schippers re:  PF on gerd rx / cough with perfume  Chief Complaint  Patient presents with  . Follow-up    PFT's done today. Pt states his breathing is overall doing well. He is not coughing much and denies any new co's today.    walking daily x 3 mile in one hour including hills/ never stops  / no sign cough    No obvious day to day or daytime variability or assoc excess/ purulent sputum or mucus plugs  cp or chest tightness, subjective wheeze or overt sinus or hb symptoms. No  unusual exp hx or h/o childhood pna/ asthma or knowledge of premature birth.  Sleeping ok without nocturnal  or early am exacerbation  of respiratory  c/o's or need for noct saba. Also denies any obvious fluctuation of symptoms with weather or environmental changes or other aggravating or alleviating factors except as outlined above   Current Medications, Allergies, Complete Past Medical History, Past Surgical History, Family History, and Social History were reviewed in [employer].  ROS  The following are not active complaints unless bolded sore throat, dysphagia, dental problems, itching, sneezing,  nasal congestion or excess/ purulent secretions, ear ache,   fever, chills, sweats, unintended wt loss, classically pleuritic or exertional cp, hemoptysis,  orthopnea pnd or leg swelling, presyncope, palpitations, abdominal pain, anorexia, nausea, vomiting, diarrhea  or change in bowel or bladder habits, change in stools or urine, dysuria,hematuria,  rash, arthralgias, visual complaints, headache, numbness, weakness or ataxia or problems with walking or coordination,  change in mood/affect or memory.         Objective:   Physical Exam  amb wm nad  Wt Readings from Last 3  Encounters:  10/11/15 184 lb (83.5 kg)  06/01/15 186 lb (84.4 kg)  05/18/15 183 lb 8 oz (83.2 kg)    Vital signs reviewed - note sats 100% on RA on arrival    HEENT: nl dentition, turbinates, and oropharynx. Nl external ear canals without cough reflex   NECK :  without JVD/Nodes/TM/ nl carotid upstrokes bilaterally   LUNGS: no acc muscle use,  Nl contour chest with   end insp crackles both bases    CV:  RRR  no s3 or murmur or increase in P2, no edema   ABD:  soft and nontender with nl inspiratory excursion in the supine position. No bruits or organomegaly, bowel sounds nl  MS:  Nl gait/ ext warm without deformities, calf tenderness, cyanosis  - very minimal  bilateral upper ext  clubbing No obvious joint restrictions   SKIN: warm and dry without lesions    NEURO:  alert, approp, nl sensorium with  no motor deficits    CXR PA and Lateral:   04/09/2015 :    I personally reviewed images and agree with radiology impression as follows:   Diffuse pulmonary interstitial prominence with subpleural reticulation and hilar distortion consistent with pulmonary fibrosis.      Assessment & Plan:

## 2016-02-03 DIAGNOSIS — J209 Acute bronchitis, unspecified: Secondary | ICD-10-CM | POA: Diagnosis not present

## 2016-03-02 ENCOUNTER — Telehealth: Payer: Self-pay | Admitting: Internal Medicine

## 2016-03-02 MED ORDER — FAMOTIDINE 20 MG PO TABS: 20.0000 mg | ORAL_TABLET | Freq: Every day | ORAL | 2 refills | Status: DC

## 2016-03-02 MED ORDER — PANTOPRAZOLE SODIUM 40 MG PO TBEC: 40.0000 mg | DELAYED_RELEASE_TABLET | Freq: Every morning | ORAL | 2 refills | Status: DC

## 2016-03-02 NOTE — Telephone Encounter (Signed)
Spoke with pt. He needs refills on Pepcid and Pantoprazole. These have been sent in. Pt has a pending OV with MW in April. Nothing further was needed.

## 2016-03-29 DIAGNOSIS — Z125 Encounter for screening for malignant neoplasm of prostate: Secondary | ICD-10-CM | POA: Diagnosis not present

## 2016-03-29 DIAGNOSIS — Z0000 Encounter for general adult medical examination without abnormal findings: Secondary | ICD-10-CM | POA: Diagnosis not present

## 2016-03-29 DIAGNOSIS — N400 Enlarged prostate without lower urinary tract symptoms: Secondary | ICD-10-CM | POA: Diagnosis not present

## 2016-03-29 DIAGNOSIS — Z13220 Encounter for screening for lipoid disorders: Secondary | ICD-10-CM | POA: Diagnosis not present

## 2016-03-30 DIAGNOSIS — N390 Urinary tract infection, site not specified: Secondary | ICD-10-CM | POA: Diagnosis not present

## 2016-04-03 DIAGNOSIS — Z23 Encounter for immunization: Secondary | ICD-10-CM | POA: Diagnosis not present

## 2016-04-03 DIAGNOSIS — N200 Calculus of kidney: Secondary | ICD-10-CM | POA: Diagnosis not present

## 2016-04-03 DIAGNOSIS — N400 Enlarged prostate without lower urinary tract symptoms: Secondary | ICD-10-CM | POA: Diagnosis not present

## 2016-04-03 DIAGNOSIS — Z0001 Encounter for general adult medical examination with abnormal findings: Secondary | ICD-10-CM | POA: Diagnosis not present

## 2016-04-03 DIAGNOSIS — Z0000 Encounter for general adult medical examination without abnormal findings: Secondary | ICD-10-CM | POA: Diagnosis not present

## 2016-04-03 DIAGNOSIS — J8410 Pulmonary fibrosis, unspecified: Secondary | ICD-10-CM | POA: Diagnosis not present

## 2016-04-03 DIAGNOSIS — Z1212 Encounter for screening for malignant neoplasm of rectum: Secondary | ICD-10-CM | POA: Diagnosis not present

## 2016-04-10 ENCOUNTER — Ambulatory Visit: Payer: PPO | Admitting: Internal Medicine

## 2016-04-28 ENCOUNTER — Ambulatory Visit: Payer: PPO | Admitting: Internal Medicine

## 2016-06-06 ENCOUNTER — Ambulatory Visit (INDEPENDENT_AMBULATORY_CARE_PROVIDER_SITE_OTHER): Payer: PPO | Admitting: Internal Medicine

## 2016-06-06 ENCOUNTER — Encounter: Payer: Self-pay | Admitting: Internal Medicine

## 2016-06-06 VITALS — BP 128/80 | HR 90 | Ht 67.0 in | Wt 188.0 lb

## 2016-06-06 DIAGNOSIS — R05 Cough: Secondary | ICD-10-CM | POA: Diagnosis not present

## 2016-06-06 DIAGNOSIS — J8410 Pulmonary fibrosis, unspecified: Secondary | ICD-10-CM

## 2016-06-06 DIAGNOSIS — R058 Other specified cough: Secondary | ICD-10-CM

## 2016-06-06 LAB — PULMONARY FUNCTION TEST
DL/VA % pred: 104 %
DL/VA: 4.63 ml/min/mmHg/L
DLCO COR % PRED: 44 %
DLCO UNC % PRED: 47 %
DLCO UNC: 13.37 ml/min/mmHg
DLCO cor: 12.65 ml/min/mmHg
FEF 25-75 POST: 3.23 L/s
FEF 25-75 PRE: 2.81 L/s
FEF2575-%Change-Post: 15 %
FEF2575-%PRED-POST: 145 %
FEF2575-%Pred-Pre: 126 %
FEV1-%Change-Post: 0 %
FEV1-%PRED-POST: 66 %
FEV1-%Pred-Pre: 65 %
FEV1-POST: 1.92 L
FEV1-PRE: 1.9 L
FEV1FVC-%Change-Post: 2 %
FEV1FVC-%PRED-PRE: 119 %
FEV6-%Change-Post: -2 %
FEV6-%PRED-POST: 56 %
FEV6-%Pred-Pre: 57 %
FEV6-POST: 2.1 L
FEV6-Pre: 2.15 L
FEV6FVC-%PRED-POST: 106 %
FEV6FVC-%Pred-Pre: 106 %
FVC-%Change-Post: -1 %
FVC-%Pred-Post: 53 %
FVC-%Pred-Pre: 54 %
FVC-Post: 2.12 L
FVC-Pre: 2.15 L
Post FEV1/FVC ratio: 91 %
Post FEV6/FVC ratio: 100 %
Pre FEV1/FVC ratio: 89 %
Pre FEV6/FVC Ratio: 100 %
RV % PRED: 36 %
RV: 0.84 L
TLC % pred: 46 %
TLC: 2.96 L

## 2016-06-06 NOTE — Progress Notes (Signed)
Subjective:    Patient ID: Austin Yu, male    DOB: Sep 25, 1945     MRN: [id number]    Brief patient profile:  56 yowm never smoker with cough onset summer 2016 eval with cxr by Dr Shelia Media who referred him to pulmonary clinic 02/01/2015 with abn cxr ? PF.    History of Present Illness  02/01/2015 1st [facility] office visit/ [name]   Chief Complaint  Patient presents with  . Pulmonary Consult    Referred by Dr Shelia Media for abn cxr.   indolent onset dry persistent daily cough x 6 m worse at hs   / worse with laugh /use of voice / no h/o sign arthritis/ amio or chemo exposures rec Pantoprazole (protonix) 40 mg   Take  30-60 min before first meal of the day and Pepcid (famotidine)  20 mg one @  bedtime until return to office   GERD diet    04/09/2015  f/u ov/Trentan Trippe re: PF on strictly gerd rx  Chief Complaint  Patient presents with  . Follow-up    PFT done today. Cough has improved. No new co's today.   able to do gxt 3 x week x grade 1/ 3.34mph x 2 miles in 30 min and feels good Cough much better / no arthritis hx or unusual exp hx  rec Increase your speed to where you a little short of breath never out of breath  Don't change diet or reflex meds for 6 months Please remember to go to the  x-ray department downstairs for your tests - we will call you with the results when they are available. Please schedule a follow up visit in 6  months but call sooner if needed with pfts on return    10/11/2015  f/u ov/[name] re:  PF on gerd rx / cough with perfume  Chief Complaint  Patient presents with  . Follow-up    PFT's done today. Pt states his breathing is overall doing well. He is not coughing much and denies any new co's today.   walking daily x 3 mile in one hour including hills/ never stops  / no sign cough rec No change in rx   06/06/2016  f/u ov/[name] re: PF / stopped walking and also stopped GERD Rx  Chief Complaint  Patient presents with  . Follow-up    PFT's done today.  Breathing is doing well and he states no new co's today.   Not limited by breathing from desired activities  -   He stopped his reflux medicines because he did not feel overt heartburn. Minimal increased daytime  cough since this was done.  No obvious day to day or daytime variability or assoc excess/ purulent sputum or mucus plugs or hemoptysis or cp or chest tightness, subjective wheeze or overt sinus or hb symptoms. No unusual exp hx or h/o childhood pna/ asthma or knowledge of premature birth.  Sleeping ok without nocturnal  or early am exacerbation  of respiratory  c/o's or need for noct saba. Also denies any obvious fluctuation of symptoms with weather or environmental changes or other aggravating or alleviating factors except as outlined above   Current Medications, Allergies, Complete Past Medical History, Past Surgical History, Family History, and Social History were reviewed in [employer] record.  ROS  The following are not active complaints unless bolded sore throat, dysphagia, dental problems, itching, sneezing,  nasal congestion or excess/ purulent secretions, ear ache,   fever, chills, sweats, unintended  wt loss, classically pleuritic or exertional cp,  orthopnea pnd or leg swelling, presyncope, palpitations, abdominal pain, anorexia, nausea, vomiting, diarrhea  or change in bowel or bladder habits, change in stools or urine, dysuria,hematuria,  rash, arthralgias, visual complaints, headache, numbness, weakness or ataxia or problems with walking or coordination,  change in mood/affect or memory.                        Objective:   Physical Exam  amb wm nad   06/06/2016      188   10/11/15 184 lb (83.5 kg)  06/01/15 186 lb (84.4 kg)  05/18/15 183 lb 8 oz (83.2 kg)    Vital signs reviewed -  - Note on arrival 02 sats  91% on RA     HEENT: nl dentition, turbinates, and oropharynx. Nl external ear canals without cough reflex   NECK :  without  JVD/Nodes/TM/ nl carotid upstrokes bilaterally   LUNGS: no acc muscle use,  Nl contour chest with  Coarse crackles on insp L > R    CV:  RRR  no s3 or murmur or increase in P2, no edema   ABD:  soft and nontender with nl inspiratory excursion in the supine position. No bruits or organomegaly, bowel sounds nl  MS:  Nl gait/ ext warm without deformities, calf tenderness, cyanosis  - mild bilateral upper ext clubbing No obvious joint restrictions   SKIN: warm and dry without lesions    NEURO:  alert, approp, nl sensorium with  no motor deficits            Assessment & Plan:

## 2016-06-06 NOTE — Progress Notes (Signed)
PFT done today. 

## 2016-06-06 NOTE — Patient Instructions (Signed)
Resume the Pantoprazole (protonix) 40 mg   Take  30-60 min before first meal of the day and Pepcid (famotidine)  20 mg one @  bedtime until return to office - this is the best way to tell whether stomach acid is contributing to your problem.    Please see patient coordinator before you leave today  to schedule HRCT chest and I will call you with results and recommendations   Please schedule a follow up office visit in 6 months , call sooner if needed

## 2016-06-07 NOTE — Assessment & Plan Note (Signed)
Trial of max gerd rx 02/01/2015 > resolved 04/09/2015   rec continue gerd rx based on concerns it may be contributing somewhat to his propensity to pulmonary fibrosis development.

## 2016-06-07 NOTE — Assessment & Plan Note (Signed)
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  In the meantime, I strongly recommend based on the previous studies on the relationship between reflux and pulmonary fibrosis that he resume maximal therapy.  I had an extended discussion with the patient reviewing all relevant studies completed to date and  lasting 15 to 20 minutes of a 25 minute visit    Each maintenance medication was reviewed in detail including most importantly the difference between maintenance and prns and under what circumstances the prns are to be triggered using an action plan format that is not reflected in the computer generated alphabetically organized AVS.    Please see AVS for specific instructions unique to this visit that I personally wrote and verbalized to the the pt in detail and then reviewed with pt  by my nurse highlighting any  changes in therapy recommended at today's visit to their plan of care.

## 2016-06-07 NOTE — Assessment & Plan Note (Signed)
Trial of max gerd rx 02/01/2015 > resolved 04/09/2015    While it is interesting that the cough that brought him to medical attention was much better on reflux medicine and has not flared off of it, the relationship between reflux and pulmonary fibrosis is compelling enough here to recommend that he maintain reflux regimen indefinitely.  Discussed the recent press about ppi's in the context of a statistically significant (but questionably clinically relevant) increase in CRI in pts on ppi vs h2's > bottom line is  that this information is anecdotally best and the risk of missing reflux is a treatable contributor to his pulmonary fibrosis which is potentially lethal  vastly outweighs any potential side effects from PPIs here.  see avs for instructions unique to this ov

## 2016-06-20 ENCOUNTER — Inpatient Hospital Stay: Admission: RE | Admit: 2016-06-20 | Payer: PPO | Source: Ambulatory Visit

## 2016-06-23 ENCOUNTER — Ambulatory Visit (INDEPENDENT_AMBULATORY_CARE_PROVIDER_SITE_OTHER)
Admission: RE | Admit: 2016-06-23 | Discharge: 2016-06-23 | Disposition: A | Discharge status: Home / Self Care | Payer: PPO | Source: Ambulatory Visit | Attending: Internal Medicine | Admitting: Internal Medicine

## 2016-06-23 DIAGNOSIS — J8410 Pulmonary fibrosis, unspecified: Secondary | ICD-10-CM | POA: Diagnosis not present

## 2016-06-23 DIAGNOSIS — R05 Cough: Secondary | ICD-10-CM | POA: Diagnosis not present

## 2016-06-26 ENCOUNTER — Other Ambulatory Visit: Payer: Self-pay | Admitting: Internal Medicine

## 2016-06-26 DIAGNOSIS — J8410 Pulmonary fibrosis, unspecified: Secondary | ICD-10-CM

## 2016-06-26 MED ORDER — PREDNISONE 10 MG PO TABS: ORAL_TABLET | ORAL | 0 refills | Status: DC

## 2016-06-26 NOTE — Progress Notes (Signed)
Spoke with pt and notified of results per Dr. Wert. Pt verbalized understanding and denied any questions. 

## 2016-07-07 ENCOUNTER — Other Ambulatory Visit: Payer: Self-pay | Admitting: Internal Medicine

## 2016-07-10 ENCOUNTER — Ambulatory Visit (INDEPENDENT_AMBULATORY_CARE_PROVIDER_SITE_OTHER)
Admission: RE | Admit: 2016-07-10 | Discharge: 2016-07-10 | Disposition: A | Discharge status: Home / Self Care | Payer: PPO | Source: Ambulatory Visit | Attending: Internal Medicine | Admitting: Internal Medicine

## 2016-07-10 ENCOUNTER — Other Ambulatory Visit: Payer: Self-pay

## 2016-07-10 DIAGNOSIS — J8410 Pulmonary fibrosis, unspecified: Secondary | ICD-10-CM

## 2016-07-10 DIAGNOSIS — R05 Cough: Secondary | ICD-10-CM | POA: Diagnosis not present

## 2016-07-17 ENCOUNTER — Telehealth: Payer: Self-pay | Admitting: Internal Medicine

## 2016-07-17 NOTE — Telephone Encounter (Signed)
Ok as add on a day after PET

## 2016-07-17 NOTE — Telephone Encounter (Signed)
Spoke with the pt and notified of recs per MW  Appt scheduled with MW for 07/27/16 at 8:45 am

## 2016-07-17 NOTE — Telephone Encounter (Signed)
Pt is scheduled for OV with MW on 07/19/16 and PET on 07/26/16. Pt is requesting to reschedule OV with MW to 07/27/16 or 07/28/16. MW has no availability those days.  MW please advise. Thanks.

## 2016-07-19 ENCOUNTER — Ambulatory Visit: Payer: PPO | Admitting: Internal Medicine

## 2016-07-26 ENCOUNTER — Ambulatory Visit (HOSPITAL_COMMUNITY)
Admission: RE | Admit: 2016-07-26 | Discharge: 2016-07-26 | Disposition: A | Discharge status: Home / Self Care | Payer: PPO | Source: Ambulatory Visit | Attending: Internal Medicine | Admitting: Internal Medicine

## 2016-07-26 DIAGNOSIS — K8020 Calculus of gallbladder without cholecystitis without obstruction: Secondary | ICD-10-CM | POA: Insufficient documentation

## 2016-07-26 DIAGNOSIS — I700 Atherosclerosis of aorta: Secondary | ICD-10-CM | POA: Diagnosis not present

## 2016-07-26 DIAGNOSIS — J8410 Pulmonary fibrosis, unspecified: Secondary | ICD-10-CM | POA: Diagnosis not present

## 2016-07-26 DIAGNOSIS — J984 Other disorders of lung: Secondary | ICD-10-CM | POA: Diagnosis not present

## 2016-07-26 LAB — GLUCOSE, CAPILLARY: GLUCOSE-CAPILLARY: 94 mg/dL (ref 65–99)

## 2016-07-26 MED ORDER — FLUDEOXYGLUCOSE F - 18 (FDG) INJECTION
9.3000 | Freq: Once | INTRAVENOUS | Status: AC | PRN
  Administered 2016-07-26: 9.3 via INTRAVENOUS

## 2016-07-26 NOTE — Progress Notes (Signed)
Spoke with pt and notified of results per Dr. Wert. Pt verbalized understanding and denied any questions. 

## 2016-07-27 ENCOUNTER — Ambulatory Visit: Payer: PPO | Admitting: Internal Medicine

## 2016-07-31 ENCOUNTER — Telehealth: Payer: Self-pay | Admitting: Internal Medicine

## 2016-07-31 MED ORDER — FAMOTIDINE 20 MG PO TABS: 20.0000 mg | ORAL_TABLET | Freq: Every day | ORAL | 5 refills | Status: DC

## 2016-07-31 NOTE — Telephone Encounter (Signed)
Called and spoke to pt. Pt is requesting a refill of pepcid 20mg . Rx sent to preferred pharmacy. Pt verbalized understanding and denied any further questions or concerns at this time.

## 2016-08-25 ENCOUNTER — Ambulatory Visit (INDEPENDENT_AMBULATORY_CARE_PROVIDER_SITE_OTHER)
Admission: RE | Admit: 2016-08-25 | Discharge: 2016-08-25 | Disposition: A | Discharge status: Home / Self Care | Payer: PPO | Source: Ambulatory Visit | Attending: Internal Medicine | Admitting: Internal Medicine

## 2016-08-25 ENCOUNTER — Encounter: Payer: Self-pay | Admitting: Internal Medicine

## 2016-08-25 ENCOUNTER — Ambulatory Visit (INDEPENDENT_AMBULATORY_CARE_PROVIDER_SITE_OTHER): Payer: PPO | Admitting: Internal Medicine

## 2016-08-25 ENCOUNTER — Other Ambulatory Visit (INDEPENDENT_AMBULATORY_CARE_PROVIDER_SITE_OTHER): Payer: PPO

## 2016-08-25 VITALS — BP 128/74 | HR 97 | Ht 67.0 in | Wt 187.0 lb

## 2016-08-25 DIAGNOSIS — R05 Cough: Secondary | ICD-10-CM | POA: Diagnosis not present

## 2016-08-25 DIAGNOSIS — J8410 Pulmonary fibrosis, unspecified: Secondary | ICD-10-CM

## 2016-08-25 DIAGNOSIS — R058 Other specified cough: Secondary | ICD-10-CM

## 2016-08-25 LAB — CBC WITH DIFFERENTIAL/PLATELET
BASOS PCT: 1.2 % (ref 0.0–3.0)
Basophils Absolute: 0.1 10*3/uL (ref 0.0–0.1)
EOS ABS: 0.2 10*3/uL (ref 0.0–0.7)
Eosinophils Relative: 2.9 % (ref 0.0–5.0)
HEMATOCRIT: 48.5 % (ref 39.0–52.0)
Hemoglobin: 16 g/dL (ref 13.0–17.0)
LYMPHS ABS: 2.4 10*3/uL (ref 0.7–4.0)
Lymphocytes Relative: 30.7 % (ref 12.0–46.0)
MCHC: 33.1 g/dL (ref 30.0–36.0)
MCV: 92.2 fl (ref 78.0–100.0)
MONO ABS: 0.6 10*3/uL (ref 0.1–1.0)
Monocytes Relative: 7 % (ref 3.0–12.0)
NEUTROS ABS: 4.6 10*3/uL (ref 1.4–7.7)
NEUTROS PCT: 58.2 % (ref 43.0–77.0)
PLATELETS: 226 10*3/uL (ref 150.0–400.0)
RBC: 5.26 Mil/uL (ref 4.22–5.81)
RDW: 14 % (ref 11.5–15.5)
WBC: 7.9 10*3/uL (ref 4.0–10.5)

## 2016-08-25 LAB — SEDIMENTATION RATE: SED RATE: 31 mm/h — AB (ref 0–20)

## 2016-08-25 NOTE — Progress Notes (Signed)
Subjective:    Patient ID: Austin Yu, male    DOB: 02-13-1945     MRN: [id number]    Brief patient profile:  61  yowm never smoker with cough onset summer 2016 eval with cxr by Dr Shelia Media who referred him to pulmonary clinic 02/01/2015 with abn cxr ? PF.    History of Present Illness  02/01/2015 1st [facility] office visit/ Wert   Chief Complaint  Patient presents with  . Pulmonary Consult    Referred by Dr Shelia Media for abn cxr.   indolent onset dry persistent daily cough x 6 m worse at hs   / worse with laugh /use of voice / no h/o sign arthritis/ amio or chemo exposures rec Pantoprazole (protonix) 40 mg   Take  30-60 min before first meal of the day and Pepcid (famotidine)  20 mg one @  bedtime until return to office   GERD diet    04/09/2015  f/u ov/Wert re: PF on strictly gerd rx  Chief Complaint  Patient presents with  . Follow-up    PFT done today. Cough has improved. No new co's today.   able to do gxt 3 x week x grade 1/ 3.16mph x 2 miles in 30 min and feels good Cough much better / no arthritis hx or unusual exp hx  rec Increase your speed to where you a little short of breath never out of breath  Don't change diet or reflex meds for 6 months Please remember to go to the  x-ray department downstairs for your tests - we will call you with the results when they are available. Please schedule a follow up visit in 6  months but call sooner if needed with pfts on return    10/11/2015  f/u ov/Wert re:  PF on gerd rx / cough with perfume  Chief Complaint  Patient presents with  . Follow-up    PFT's done today. Pt states his breathing is overall doing well. He is not coughing much and denies any new co's today.   walking daily x 3 mile in one hour including hills/ never stops  / no sign cough rec No change in rx   06/06/2016  f/u ov/Wert re: PF / stopped walking and also stopped GERD Rx  Chief Complaint  Patient presents with  . Follow-up    PFT's done today.  Breathing is doing well and he states no new co's today.   Not limited by breathing from desired activities     He stopped his reflux medicines because he did not feel overt heartburn. Minimal increased daytime  cough since this was done. rec Resume the Pantoprazole (protonix) 40 mg   Take  30-60 min before first meal of the day and Pepcid (famotidine)  20 mg one @  bedtime until return to office - this is the best way to tell whether stomach acid is contributing to your problem.    HRCT 06/23/16 1. Pulmonary parenchymal pattern of fibrosis is indicative of fibrotic nonspecific interstitial pneumonitis or usual interstitial pneumonitis. 2. Enlarging area of nodular ground-glass in the left lower lobe. While this may represent a superimposed infectious or inflammatory lesion, enlargement is worrisome for adenocarcinoma.  - 06/26/2016 rec Take Prednisone  4 for three days 3 for three days 2 for three days 1 for three days and stop  > repeat HRCT in 2 weeks > no change in density 07/10/2016 > rec PET  07/26/2016 . Low level FDG uptake  and the patient's bilateral basilar lung disease. Low level uptake identified in a right hilar lymph node, potentially reactive.     08/25/2016  f/u ov/Wert re:  PF with GG changes / still on gerd rx/ not sure pred helped  Chief Complaint  Patient presents with  . Follow-up    c/o increased sob with warm weather.    doe = MMRC1 = can walk nl pace, flat grade, can't hurry or go uphills or steps s sob  / some cough on exertion    No obvious day to day or daytime variability or assoc excess/ purulent sputum or mucus plugs or hemoptysis or cp or chest tightness, subjective wheeze or overt sinus or hb symptoms. No unusual exp hx or h/o childhood pna/ asthma or knowledge of premature birth.  Sleeping ok without nocturnal  or early am exacerbation  of respiratory  c/o's or need for noct saba. Also denies any obvious fluctuation of symptoms with weather or environmental  changes or other aggravating or alleviating factors except as outlined above   Current Medications, Allergies, Complete Past Medical History, Past Surgical History, Family History, and Social History were reviewed in [employer] record.  ROS  The following are not active complaints unless bolded sore throat, dysphagia, dental problems, itching, sneezing,  nasal congestion or excess/ purulent secretions, ear ache,   fever, chills, sweats, unintended wt loss, classically pleuritic or exertional cp,  orthopnea pnd or leg swelling, presyncope, palpitations, abdominal pain, anorexia, nausea, vomiting, diarrhea  or change in bowel or bladder habits, change in stools or urine, dysuria,hematuria,  rash, arthralgias, visual complaints, headache, numbness, weakness or ataxia or problems with walking or coordination,  change in mood/affect or memory.               Objective:   Physical Exam  amb wm nad  08/26/2016        187   06/06/2016      188   10/11/15 184 lb (83.5 kg)  06/01/15 186 lb (84.4 kg)  05/18/15 183 lb 8 oz (83.2 kg)    Vital signs reviewed -  - Note on arrival 02 sats  95% on RA     HEENT: nl dentition, turbinates, and oropharynx. Nl external ear canals without cough reflex   NECK :  without JVD/Nodes/TM/ nl carotid upstrokes bilaterally   LUNGS: no acc muscle use,  Nl contour chest with  Coarse crackles on insp L > R base     CV:  RRR  no s3 or murmur or increase in P2, no edema   ABD:  soft and nontender with nl inspiratory excursion in the supine position. No bruits or organomegaly, bowel sounds nl  MS:  Nl gait/ ext warm without deformities, calf tenderness, cyanosis  - very mild bilateral upper ext clubbing No obvious joint restrictions   SKIN: warm and dry without lesions    NEURO:  alert, approp, nl sensorium with  no motor deficits   CXR PA and Lateral:   08/25/2016 :    I personally reviewed images and agree with radiology impression  as follows:    Unchanged heart size and mediastinal contours with mild cardiomegaly. Right mediastinal prominence is unchanged. Basilar predominant fibrosis is similar to prior. Interstitial coarsening and streaky perihilar opacities are also unchanged. No evidence of superimposed focal airspace disease, pleural effusion, pulmonary edema or pneumothorax. Stable osseous structures.     Lab Results  Component Value Date   ESRSEDRATE 30 (H)  08/25/2016   08/25/2016  HSP and ctd screen                 Assessment & Plan:

## 2016-08-25 NOTE — Patient Instructions (Addendum)
Please remember to go to the lab department downstairs in the basement  for your tests - we will call you with the results when they are available.  Continue the acid suppression and diet    Please schedule a follow up office visit in 6 weeks, call sooner if needed (unless we refer you to [facility] for a second opinion)  Add:  Esr 31 so rx pred 20 mg daily and refer to [facility]

## 2016-08-26 ENCOUNTER — Encounter: Payer: Self-pay | Admitting: Internal Medicine

## 2016-08-26 NOTE — Assessment & Plan Note (Addendum)
Onset of cough summer 2016 > improved on GERD rx 04/09/2015  -  PFT's  04/09/2015   VC 58%   with DLCO  53/62 % corrects to 111 % for alv volume   -  PFT's  10/11/2015   FVC = 2.34 (59%) DLCO  54/58 % corrects to 125  % for alv volume   -  PFT's  06/06/2016   FVC=  2.15 (54%) DLCO  47/44c % corrects to 104  % for alv volume   - 06/06/2016  Walked RA x 3 laps @ 185 ft each stopped due to  End of study, nl pace, no sob or desat    - HRCT 06/23/16 1. Pulmonary parenchymal pattern of fibrosis is indicative of fibrotic nonspecific interstitial pneumonitis or usual interstitial pneumonitis. 2. Enlarging area of nodular ground-glass in the left lower lobe. While this may represent a superimposed infectious or inflammatory lesion, enlargement is worrisome for adenocarcinoma.  - 06/26/2016 rec Take Prednisone  4 for three days 3 for three days 2 for three days 1 for three days and stop  > repeat HRCT in 2 weeks > no change in density 07/10/2016 > rec PET  07/26/2016 . Low level FDG uptake and the patient's bilateral basilar lung disease. Low level uptake identified in a right hilar lymph node, potentially reactive.  - 08/25/2016   Walked RA  2 laps @ 185 ft each stopped due to  desats to 86% rapid pace, min symptoms - 08/25/2016  ESR = 31 with GG changes on hrct so rec CTD/HSP screen, pred 20 mg daily and f/u [facility]  The GG changes are not at all typical of UIP or BAC (though not ruled out and risk of attempting bx here is high)  and much more likely nsip or BOOP and not esr not that impressive considering not on pred yet so rec trial of pred relatively low dose and refer to [facility] for second opinion pending results of above studies  Discussed in detail all the  indications, usual  risks and alternatives  relative to the benefits with patient who agrees to proceed with rx/ eval as outlined     I had an extended discussion with the patient reviewing all relevant studies completed to date and  lasting 15 to 20 minutes of  a 25 minute visit    Each maintenance medication was reviewed in detail including most importantly the difference between maintenance and prns and under what circumstances the prns are to be triggered using an action plan format that is not reflected in the computer generated alphabetically organized AVS.    Please see AVS for specific instructions unique to this visit that I personally wrote and verbalized to the the pt in detail and then reviewed with pt  by my nurse highlighting any  changes in therapy recommended at today's visit to their plan of care.

## 2016-08-26 NOTE — Assessment & Plan Note (Signed)
Trial of max gerd rx 02/01/2015 > resolved 04/09/2015   Cough flares off rx for gerd so rec continue indefinitely esp based on Use of PPI is associated with improved survival time and with decreased radiologic fibrosis per King's study published in AJRCCM vol 184 p1390.  Dec 2011 and also may have other beneficial effects as per the latest review in [place] vol 193 X1062 Jun 20016.

## 2016-08-28 ENCOUNTER — Telehealth: Payer: Self-pay | Admitting: Internal Medicine

## 2016-08-28 LAB — CYCLIC CITRUL PEPTIDE ANTIBODY, IGG: Cyclic Citrullin Peptide Ab: <16 Units

## 2016-08-28 LAB — RHEUMATOID FACTOR

## 2016-08-28 MED ORDER — PREDNISONE 10 MG PO TABS: 20.0000 mg | ORAL_TABLET | Freq: Every day | ORAL | 1 refills | Status: DC

## 2016-08-28 MED ORDER — PREDNISONE 10 MG PO TABS
ORAL_TABLET | ORAL | 1 refills | Status: DC
Start: 2016-08-28 — End: 2016-08-28

## 2016-08-28 NOTE — Telephone Encounter (Signed)
pred sent to pharm

## 2016-08-28 NOTE — Telephone Encounter (Signed)
I called and spoke with the pt regarding labs and have answered all of his questions  Nothing further needed per pt See result note on labs dated 8/17

## 2016-08-28 NOTE — Progress Notes (Signed)
Spoke with pt and notified of results per Dr. Wert. Pt verbalized understanding and denied any questions. 

## 2016-08-28 NOTE — Progress Notes (Signed)
Spoke with pt and notified of results per Dr. Melvyn Novas. Pt verbalized understanding and denied any questions. pred sent to pharm and referral to [facility] already made

## 2016-08-29 LAB — ANA: ANA: NEGATIVE

## 2016-08-31 LAB — HYPERSENSITIVITY PNUEMONITIS PROFILE

## 2016-09-01 NOTE — Progress Notes (Signed)
Spoke with pt and notified of results per Dr. Wert. Pt verbalized understanding and denied any questions. 

## 2016-10-09 ENCOUNTER — Ambulatory Visit: Payer: PPO | Admitting: Internal Medicine

## 2016-11-27 DIAGNOSIS — N201 Calculus of ureter: Secondary | ICD-10-CM | POA: Diagnosis not present

## 2016-11-27 DIAGNOSIS — N202 Calculus of kidney with calculus of ureter: Secondary | ICD-10-CM | POA: Diagnosis not present

## 2016-12-04 ENCOUNTER — Ambulatory Visit: Payer: PPO | Admitting: Internal Medicine

## 2016-12-08 ENCOUNTER — Other Ambulatory Visit: Payer: Self-pay | Admitting: Internal Medicine

## 2016-12-12 DIAGNOSIS — Z5181 Encounter for therapeutic drug level monitoring: Secondary | ICD-10-CM | POA: Diagnosis not present

## 2016-12-12 DIAGNOSIS — R911 Solitary pulmonary nodule: Secondary | ICD-10-CM | POA: Diagnosis not present

## 2016-12-12 DIAGNOSIS — J84112 Idiopathic pulmonary fibrosis: Secondary | ICD-10-CM | POA: Diagnosis not present

## 2016-12-12 DIAGNOSIS — R0602 Shortness of breath: Secondary | ICD-10-CM | POA: Diagnosis not present

## 2017-02-28 ENCOUNTER — Other Ambulatory Visit: Payer: Self-pay | Admitting: Internal Medicine

## 2017-03-13 DIAGNOSIS — R911 Solitary pulmonary nodule: Secondary | ICD-10-CM | POA: Diagnosis not present

## 2017-03-13 DIAGNOSIS — J84112 Idiopathic pulmonary fibrosis: Secondary | ICD-10-CM | POA: Diagnosis not present

## 2017-03-13 DIAGNOSIS — Z5181 Encounter for therapeutic drug level monitoring: Secondary | ICD-10-CM | POA: Diagnosis not present

## 2017-03-14 ENCOUNTER — Telehealth: Payer: Self-pay | Admitting: Internal Medicine

## 2017-03-14 DIAGNOSIS — J8410 Pulmonary fibrosis, unspecified: Secondary | ICD-10-CM

## 2017-03-14 NOTE — Telephone Encounter (Signed)
Spoke with the pt  He states he was seen yesterday by Dr Floy Sabina at [facility] for f/u   Dr Floy Sabina wanted him to have a f.u CT Chest  He wants to have this done here rather than at [facility] and therefore Dr Floy Sabina wants Dr. Melvyn Novas to order  Please advise if this is okay thanks  Last ov recs from 08/25/16 state f/u here in 6 wks unless est with [facility]

## 2017-03-14 NOTE — Telephone Encounter (Signed)
Spoke with the pt and notified of recs per MW  Order sent to [facility] for HRCT

## 2017-03-14 NOTE — Telephone Encounter (Signed)
Fine with me:  hrct chest dx PF

## 2017-03-22 ENCOUNTER — Ambulatory Visit (INDEPENDENT_AMBULATORY_CARE_PROVIDER_SITE_OTHER)
Admission: RE | Admit: 2017-03-22 | Discharge: 2017-03-22 | Disposition: A | Discharge status: Home / Self Care | Payer: PPO | Source: Ambulatory Visit | Attending: Internal Medicine | Admitting: Internal Medicine

## 2017-03-22 DIAGNOSIS — J8410 Pulmonary fibrosis, unspecified: Secondary | ICD-10-CM | POA: Diagnosis not present

## 2017-03-23 NOTE — Progress Notes (Signed)
Spoke with pt and notified of results per Dr. Wert. Pt verbalized understanding and denied any questions. 

## 2017-04-09 DIAGNOSIS — Z0000 Encounter for general adult medical examination without abnormal findings: Secondary | ICD-10-CM | POA: Diagnosis not present

## 2017-04-09 DIAGNOSIS — E782 Mixed hyperlipidemia: Secondary | ICD-10-CM | POA: Diagnosis not present

## 2017-04-11 DIAGNOSIS — N401 Enlarged prostate with lower urinary tract symptoms: Secondary | ICD-10-CM | POA: Diagnosis not present

## 2017-04-11 DIAGNOSIS — R972 Elevated prostate specific antigen [PSA]: Secondary | ICD-10-CM | POA: Diagnosis not present

## 2017-04-12 DIAGNOSIS — Z23 Encounter for immunization: Secondary | ICD-10-CM | POA: Diagnosis not present

## 2017-04-12 DIAGNOSIS — N200 Calculus of kidney: Secondary | ICD-10-CM | POA: Diagnosis not present

## 2017-04-12 DIAGNOSIS — E782 Mixed hyperlipidemia: Secondary | ICD-10-CM | POA: Diagnosis not present

## 2017-04-12 DIAGNOSIS — N400 Enlarged prostate without lower urinary tract symptoms: Secondary | ICD-10-CM | POA: Diagnosis not present

## 2017-04-12 DIAGNOSIS — Z0001 Encounter for general adult medical examination with abnormal findings: Secondary | ICD-10-CM | POA: Diagnosis not present

## 2017-04-12 DIAGNOSIS — Z6830 Body mass index (BMI) 30.0-30.9, adult: Secondary | ICD-10-CM | POA: Diagnosis not present

## 2017-04-12 DIAGNOSIS — Q825 Congenital non-neoplastic nevus: Secondary | ICD-10-CM | POA: Diagnosis not present

## 2017-04-12 DIAGNOSIS — R3129 Other microscopic hematuria: Secondary | ICD-10-CM | POA: Diagnosis not present

## 2017-04-12 DIAGNOSIS — N390 Urinary tract infection, site not specified: Secondary | ICD-10-CM | POA: Diagnosis not present

## 2017-04-12 DIAGNOSIS — K219 Gastro-esophageal reflux disease without esophagitis: Secondary | ICD-10-CM | POA: Diagnosis not present

## 2017-04-12 DIAGNOSIS — I517 Cardiomegaly: Secondary | ICD-10-CM | POA: Diagnosis not present

## 2017-04-12 DIAGNOSIS — J8410 Pulmonary fibrosis, unspecified: Secondary | ICD-10-CM | POA: Diagnosis not present

## 2017-04-12 DIAGNOSIS — R0989 Other specified symptoms and signs involving the circulatory and respiratory systems: Secondary | ICD-10-CM | POA: Diagnosis not present

## 2017-04-15 ENCOUNTER — Other Ambulatory Visit: Payer: Self-pay | Admitting: Internal Medicine

## 2017-04-23 ENCOUNTER — Encounter: Payer: Self-pay | Admitting: Internal Medicine

## 2017-04-23 ENCOUNTER — Ambulatory Visit: Payer: PPO | Admitting: Internal Medicine

## 2017-04-23 VITALS — BP 114/72 | HR 81 | Ht 67.0 in | Wt 183.0 lb

## 2017-04-23 DIAGNOSIS — J8410 Pulmonary fibrosis, unspecified: Secondary | ICD-10-CM

## 2017-04-23 MED ORDER — FAMOTIDINE 20 MG PO TABS: 20.0000 mg | ORAL_TABLET | Freq: Every day | ORAL | 11 refills | Status: DC

## 2017-04-23 NOTE — Patient Instructions (Addendum)
While on OFEV your lft's need to be checked every 3 months   Continue Pantoprazole (protonix) 40 mg   Take  30-60 min before first meal of the day and Pepcid (famotidine)  20 mg one @  bedtime      If you are satisfied with your treatment plan,  let your doctor know and he/she can either refill your medications or you can return here when your prescription runs out.     If in any way you are not 100% satisfied,  please tell us.  If 100% better, tell your friends!  Pulmonary follow up is as needed

## 2017-04-23 NOTE — Assessment & Plan Note (Signed)
Onset of cough summer 2016 > improved on GERD rx 04/09/2015  -  PFT's  04/09/2015   VC 58%   with DLCO  53/62 % corrects to 111 % for alv volume   -  PFT's  10/11/2015   FVC = 2.34 (59%) DLCO  54/58 % corrects to 125  % for alv volume   -  PFT's  06/06/2016   FVC=  2.15 (54%) DLCO  47/44c % corrects to 104  % for alv volume   - 06/06/2016  Walked RA x 3 laps @ 185 ft each stopped due to  End of study, nl pace, no sob or desat    - HRCT 06/23/16 1. Pulmonary parenchymal pattern of fibrosis is indicative of fibrotic nonspecific interstitial pneumonitis or usual interstitial pneumonitis. 2. Enlarging area of nodular ground-glass in the left lower lobe. While this may represent a superimposed infectious or inflammatory lesion, enlargement is worrisome for adenocarcinoma.  - 06/26/2016 rec Take Prednisone  4 for three days 3 for three days 2 for three days 1 for three days and stop  > repeat HRCT in 2 weeks > no change in density 07/10/2016 > rec PET  07/26/2016 . Low level FDG uptake and the patient's bilateral basilar lung disease. Low level uptake identified in a right hilar lymph node, potentially reactive. - 08/25/2016   Walked RA  2 laps @ 185 ft each stopped due to  desats to 86% rapid pace, min symptoms - 08/25/2016  ESR = 31 with GG changes on hrct so rec CTD/HSP screen> all neg >>>  pred 20 mg daily and f/u [facility]>  Seen 12/12/16 rec rx as UIP :  OFEV started  - HRCT 03/22/16 1. Basilar predominant fibrotic interstitial lung disease with probable early honeycombing. No convincing interval progression since 07/10/2016, although with mild progression at the lung bases since 05/05/2015. Findings represent possible usual interstitial pneumonia (UIP), with fibrotic phase nonspecific interstitial pneumonia (NSIP) on the differential. Consider continued high-resolution chest CT surveillance in 12 months. 2. Stable mild mediastinal lymphadenopathy, most compatible with benign reactive adenopathy. 3. Chronic  findings include: Coronary atherosclerosis. Small hiatal hernia. Diffuse hepatic steatosis. Cholelithiasis. Nonobstructing right nephrolithiasis   Appears to have stabilized off pred/on OFEV since Dec 2018 both clinically and by serial hrct so no change in rx  ? Whether still needs gerd rx and note he has [place] on CT  Use of PPI is associated with improved survival time and with decreased radiologic fibrosis per King's study published in [facility] vol 184 p1390.  Dec 2011 and also may have other beneficial effects as per the latest review in [place] vol 193 E4235 Jun 20016.  This may not always be cause and effect but note cough worse off noct h2 so rec resumem max/ =24 h acid suppression and continue  diet/ lifestyle modification and f/u  At [facility] as planned  Have strongly encouraged regular walking with 02 sats to monitor progress on OFEV plus monthly LFTs and f/u here prn   I had an extended discussion with the patient reviewing all relevant studies completed to date and  lasting 15 to 20 minutes of a 25 minute visit    Each maintenance medication was reviewed in detail including most importantly the difference between maintenance and prns and under what circumstances the prns are to be triggered using an action plan format that is not reflected in the computer generated alphabetically organized AVS.    Please see AVS for specific instructions unique to  this visit that I personally wrote and verbalized to the the pt in detail and then reviewed with pt  by my nurse highlighting any  changes in therapy recommended at today's visit to their plan of care.

## 2017-04-23 NOTE — Progress Notes (Addendum)
Subjective:    Patient ID: Austin Yu, male    DOB: 1946-01-06     MRN: [id number]    Brief patient profile:  61  yowm never smoker with cough onset summer 2016 eval with cxr by Dr Austin Yu who referred him to pulmonary clinic 02/01/2015 with abn cxr ? PF.    History of Present Illness  02/01/2015 1st [name]   Chief Complaint  Patient presents with  . Pulmonary Consult    Referred by Dr Austin Yu for abn cxr.   indolent onset dry persistent daily cough x 6 m worse at hs   / worse with laugh /use of voice / no h/o sign arthritis/ amio or chemo exposures rec Pantoprazole (protonix) 40 mg   Take  30-60 min before first meal of the day and Pepcid (famotidine)  20 mg one @  bedtime until return to office   GERD diet    04/09/2015  f/u ov/Austin Yu re: PF on strictly gerd rx  Chief Complaint  Patient presents with  . Follow-up    PFT done today. Cough has improved. No new co's today.   able to do gxt 3 x week x grade 1/ 3.77mh x 2 miles in 30 min and feels good Cough much better / no arthritis hx or unusual exp hx  rec Increase your speed to where you a little short of breath never out of breath  Don't change diet or reflex meds for 6 months Please remember to go to the  x-ray department downstairs for your tests - we will call you with the results when they are available. Please schedule a follow up visit in 6  months but call sooner if needed with pfts on return    10/11/2015  f/u ov/Austin Yu re:  PF on gerd rx / cough with perfume  Chief Complaint  Patient presents with  . Follow-up    PFT's done today. Pt states his breathing is overall doing well. He is not coughing much and denies any new co's today.   walking daily x 3 mile in one hour including hills/ never stops  / no sign cough rec No change in rx   06/06/2016  f/u ov/Austin Yu re: PF / stopped walking and also stopped GERD Rx  Chief Complaint  Patient presents with  . Follow-up    PFT's done today.  Breathing is doing well and he states no new co's today.   Not limited by breathing from desired activities     He stopped his reflux medicines because he did not feel overt heartburn. Minimal increased daytime  cough since this was done. rec Resume the Pantoprazole (protonix) 40 mg   Take  30-60 min before first meal of the day and Pepcid (famotidine)  20 mg one @  bedtime until return to office - this is the best way to tell whether stomach acid is contributing to your problem.    HRCT 06/23/16 1. Pulmonary parenchymal pattern of fibrosis is indicative of fibrotic nonspecific interstitial pneumonitis or usual interstitial pneumonitis. 2. Enlarging area of nodular ground-glass in the left lower lobe. While this may represent a superimposed infectious or inflammatory lesion, enlargement is worrisome for adenocarcinoma.  - 06/26/2016 rec Take Prednisone  4 for three days 3 for three days 2 for three days 1 for three days and stop  > repeat HRCT in 2 weeks > no change in density 07/10/2016 > rec PET  07/26/2016 . Low level FDG uptake  and the patient's bilateral basilar lung disease. Low level uptake identified in a right hilar lymph node, potentially reactive.     08/25/2016  f/u ov/Austin Yu re:  PF with GG changes / still on gerd rx/ not sure pred helped  Chief Complaint  Patient presents with  . Follow-up    c/o increased sob with warm weather.    doe = MMRC1 = can walk nl pace, flat grade, can't hurry or go uphills or steps s sob  / some cough on exertion  rec Please remember to go to the lab department downstairs in the basement  for your tests - we will call you with the results when they are available. Continue the acid suppression and diet  Please schedule a follow up office visit in 6 weeks, call sooner if needed (unless we refer you to [facility] for a second opinion)  Add:  Esr 31 so rx pred 20 mg daily and refer to [facility]> started on OFEV Dec 2018     04/23/2017  f/u ov/Austin Yu re:  PF  On OFEV  since Dec 2018  Chief Complaint  Patient presents with  . Follow-up    Breathing is unchanged. Here to review recent chest ct.   Dyspnea:  Not very active :  Goal is to walk up to 2 miles per day but hasn't started yet  Cough: overall better ? Since started  OFEV/ worse off pepcid attributes to pollen though never had this before in spring  Sleep: ok SABA use:  None Diarrhea has developed on OFEV 150 bid but tolerable/ controllable with diet    No obvious day to day or daytime variability or assoc excess/ purulent sputum or mucus plugs or hemoptysis or cp or chest tightness, subjective wheeze or overt sinus or hb symptoms. No unusual exposure hx or h/o childhood pna/ asthma or knowledge of premature birth.  Sleeping  Ok flat  without nocturnal  or early am exacerbation  of respiratory  c/o's or need for noct saba. Also denies any obvious fluctuation of symptoms with weather or environmental changes or other aggravating or alleviating factors except as outlined above   Current Allergies, Complete Past Medical History, Past Surgical History, Family History, and Social History were reviewed in [employer] record.  ROS  The following are not active complaints unless bolded Hoarseness, sore throat, dysphagia, dental problems, itching, sneezing,  nasal congestion or discharge of excess mucus or purulent secretions, ear ache,   fever, chills, sweats, unintended wt loss or wt gain, classically pleuritic or exertional cp,  orthopnea pnd or arm/hand swelling  or leg swelling, presyncope, palpitations, abdominal pain, anorexia, nausea, vomiting, diarrhea/lactose intol since started OFEV  or change in bowel habits or change in bladder habits, change in stools or change in urine, dysuria, hematuria,  rash, arthralgias, visual complaints, headache, numbness, weakness or ataxia or problems with walking or coordination,  change in mood or  memory.        Current Meds  Medication Sig  .      Marland Kitchen Nintedanib (OFEV) 150 MG CAPS Take 1 tablet by mouth 2 (two) times daily.  . pantoprazole (PROTONIX) 40 MG tablet TAKE 1 TABLET BY MOUTH EVERY MORNING 30 TO 60 MINUTES BEFORE FIRST MEAL OF THE DAY  . tamsulosin (FLOMAX) 0.4 MG CAPS capsule Take 1 capsule by mouth daily after breakfast.   .     Marland Kitchen  Objective:   Physical Exam  amb wm nad    04/23/2017        183  08/26/2016        187   06/06/2016      188   10/11/15 184 lb (83.5 kg)  06/01/15 186 lb (84.4 kg)  05/18/15 183 lb 8 oz (83.2 kg)    Vital signs reviewed - Note on arrival 02 sats  95% on RA     HEENT: nl dentition, turbinates bilaterally, and oropharynx. Nl external ear canals without cough reflex   NECK :  without JVD/Nodes/TM/ nl carotid upstrokes bilaterally   LUNGS: no acc muscle use,  Nl contour chest with bilateral insp crackles in bases and  with  cough on insp   maneuvers   CV:  RRR  no s3 or murmur or increase in P2, and no edema   ABD:  soft and nontender with nl inspiratory excursion in the supine position. No bruits or organomegaly appreciated, bowel sounds nl  MS:  Nl gait/ ext warm without deformities, calf tenderness, cyanosis - moderate clubbing No obvious joint restrictions   SKIN: warm and dry without lesions    NEURO:  alert, approp, nl sensorium with  no motor or cerebellar deficits apparent.                         Assessment & Plan:

## 2017-04-24 DIAGNOSIS — Z1212 Encounter for screening for malignant neoplasm of rectum: Secondary | ICD-10-CM | POA: Diagnosis not present

## 2017-04-24 DIAGNOSIS — Z1211 Encounter for screening for malignant neoplasm of colon: Secondary | ICD-10-CM | POA: Diagnosis not present

## 2017-05-05 LAB — COLOGUARD

## 2017-05-08 DIAGNOSIS — R195 Other fecal abnormalities: Secondary | ICD-10-CM | POA: Diagnosis not present

## 2017-05-08 DIAGNOSIS — J8410 Pulmonary fibrosis, unspecified: Secondary | ICD-10-CM | POA: Diagnosis not present

## 2017-05-08 DIAGNOSIS — K219 Gastro-esophageal reflux disease without esophagitis: Secondary | ICD-10-CM | POA: Diagnosis not present

## 2017-05-16 DIAGNOSIS — R972 Elevated prostate specific antigen [PSA]: Secondary | ICD-10-CM | POA: Diagnosis not present

## 2017-05-23 DIAGNOSIS — Z87442 Personal history of urinary calculi: Secondary | ICD-10-CM | POA: Diagnosis not present

## 2017-05-23 DIAGNOSIS — R972 Elevated prostate specific antigen [PSA]: Secondary | ICD-10-CM | POA: Diagnosis not present

## 2017-06-04 ENCOUNTER — Other Ambulatory Visit: Payer: Self-pay | Admitting: Internal Medicine

## 2017-06-30 ENCOUNTER — Other Ambulatory Visit: Payer: Self-pay | Admitting: Internal Medicine

## 2017-08-01 ENCOUNTER — Telehealth: Payer: Self-pay | Admitting: Internal Medicine

## 2017-08-01 MED ORDER — FAMOTIDINE 20 MG PO TABS: 20.0000 mg | ORAL_TABLET | Freq: Every day | ORAL | 3 refills | Status: AC

## 2017-08-01 NOTE — Telephone Encounter (Signed)
Spoke with pt .  He is requesting a 90 day supply of Pepcid be sent to pharmacy.  Rx sent.  Nothing further needed.

## 2017-10-01 ENCOUNTER — Other Ambulatory Visit: Payer: Self-pay | Admitting: Internal Medicine

## 2017-10-23 DIAGNOSIS — J84112 Idiopathic pulmonary fibrosis: Secondary | ICD-10-CM | POA: Diagnosis not present

## 2017-10-23 DIAGNOSIS — Z23 Encounter for immunization: Secondary | ICD-10-CM | POA: Diagnosis not present

## 2017-10-23 DIAGNOSIS — Z5181 Encounter for therapeutic drug level monitoring: Secondary | ICD-10-CM | POA: Diagnosis not present

## 2017-10-23 DIAGNOSIS — R5381 Other malaise: Secondary | ICD-10-CM | POA: Diagnosis not present

## 2017-10-23 DIAGNOSIS — Z79899 Other long term (current) drug therapy: Secondary | ICD-10-CM | POA: Diagnosis not present

## 2017-11-20 DIAGNOSIS — R195 Other fecal abnormalities: Secondary | ICD-10-CM | POA: Diagnosis not present

## 2017-11-20 DIAGNOSIS — Z87898 Personal history of other specified conditions: Secondary | ICD-10-CM | POA: Diagnosis not present

## 2017-11-29 ENCOUNTER — Encounter: Payer: Self-pay | Admitting: Gastroenterology

## 2017-12-27 ENCOUNTER — Ambulatory Visit (INDEPENDENT_AMBULATORY_CARE_PROVIDER_SITE_OTHER): Payer: PPO | Admitting: Gastroenterology

## 2017-12-27 ENCOUNTER — Encounter: Payer: Self-pay | Admitting: Gastroenterology

## 2017-12-27 VITALS — BP 126/80 | HR 90 | Ht 67.0 in | Wt 194.0 lb

## 2017-12-27 DIAGNOSIS — J8410 Pulmonary fibrosis, unspecified: Secondary | ICD-10-CM

## 2017-12-27 DIAGNOSIS — R195 Other fecal abnormalities: Secondary | ICD-10-CM | POA: Diagnosis not present

## 2017-12-27 MED ORDER — NA SULFATE-K SULFATE-MG SULF 17.5-3.13-1.6 GM/177ML PO SOLN: 1.0000 | Freq: Once | ORAL | 0 refills | Status: AC

## 2017-12-27 NOTE — Patient Instructions (Signed)
If you are age 71 or older, your body mass index should be between 23-30. Your Body mass index is 30.38 kg/m. If this is out of the aforementioned range listed, please consider follow up with your Primary Care Provider.  If you are age 9 or younger, your body mass index should be between 19-25. Your Body mass index is 30.38 kg/m. If this is out of the aformentioned range listed, please consider follow up with your Primary Care Provider.   You have been scheduled for a colonoscopy. Please follow written instructions given to you at your visit today.  Please pick up your prep supplies at the pharmacy within the next 1-3 days. If you use inhalers (even only as needed), please bring them with you on the day of your procedure. Your physician has requested that you go to www.startemmi.com and enter the access code given to you at your visit today. This web site gives a general overview about your procedure. However, you should still follow specific instructions given to you by our office regarding your preparation for the procedure.  It was a pleasure to see you today!  Dr. Loletha Carrow

## 2017-12-27 NOTE — Progress Notes (Signed)
Austin Yu Consult Note:  History: Austin Yu 12/27/2017  Referring physician: Deland Pretty, MD  Reason for consult/chief complaint: screening colonoscopy (positive cologard with pcp; no GI complaint at this time)   Subjective  HPI:  This is a very pleasant 72 year old man furred by primary care for positive Cologuard, which was his first colon cancer screening test ever. He denies abdominal pain, altered bowel habits or rectal bleeding.  He has chronic interstitial lung disease as noted below.  Excerpt from recent [place] pulmonary clinic note: "Laboratory Data/Test Results: Pulmonary Function Test (PFT) Latest Ref Rng & Units 12/12/2016 10/23/2017  FVC PRE L 1.99 1.93  FVC % PRE PRED % 49 48  FEV1 PRE L 1.58 1.58  FEV1 % PRE PRED % 50 50  FEV1/FVC PRE % 79.24 81.74  TLC PRE L 2.98 -  TLC % PRE PRED % 47 -  RV PRE L 1 -  RV % PRE PRED % 44 -  DLCO PRE ml/(min*mmHg) 9.86 9.87  DLCO % PRE PRED % 41 41  FEF25-75% PRE L/s 1.4 1.76  FEF25-75% % PRE PRED % 48 60   I personally reviewed and interpreted the pulmonary function test from today, which demonstrate no obstruction, severe spirometric restriction, and a substantially reduced diffusion capacity. As compared to pulmonary function test from December 2018, there has been no significant interval change.  Assessment and Plan: Mr. Veals is a 72 y.o. male non-smoker with fibrotic diffuse parenchymal lung disease in a probable UIP pattern. No underlying connective tissue disease or causative exposure has been identified to date. He is tolerating nintedanib anti-fibrotic therapy for a working diagnosis of idiopathic pulmonary fibrosis as well.  Mr. Chiou notes a modest interval worsening of exertional dyspnea. This has not impacted his daily activity in a significant way. We discussed the importance of engaging in an aerobic exercise regimen to maintain his cardiovascular and physical conditioning to  minimize symptomatic burden associated with the progressive lung disease. We also discussed the potential utility of supplemental oxygen with exertion. He agrees a more active lifestyle and regular aerobic exercise is important. He plans to do this with regular walking and an exercise regimen at a local gym. Mr. Addo was not ready to commit to attending pulmonary rehabilitation.  We discussed the potential benefit of supplemental oxygen with exertion. Though high-quality trials have not demonstrated a mortality benefit for treating exertional hypoxemia (with adequate oxygenation at rest), there may be some symptomatic benefit in terms of increased exercise tolerance. Mr. Fenech is not excited about using supplemental oxygen with exertion. We will plan a repeat simple walk test with oxygen titration at his next visit to try and quantify his supplemental oxygen requirement and any interval change.  Finally, we did discuss the importance of routine LFT monitoring with nintedanib. I did not order LFTs today because he has not been taking the medication for the past week, which may lead to falsely reassuring results. After resuming the medication (now that he has returned from his travels), we will have LFTs collected at a local lab facility in 2 to 3 weeks and faxed to my attention. At this point, I think it would be okay to monitor every 6 months with his follow-up visit if the lab in 2 to 3 weeks is normal. "  Austin Yu's primary care provider has noted that caution is warranted due to previous "aspiration during anesthesia".  Austin Yu had a lithotripsy for kidney stones twice in May 2017, and he  says he aspirated on the second of those episodes.  He was not admitted for that, and I cannot seem to find any anesthesia notes related to this episode.  Austin Yu recalls being told by the anesthesiologist they believe he did aspirate but that he would be okay, and that he should inform physicians in the future if he requires  sedation for procedure.  ROS:  Review of Systems  He denies chest pain dyspnea or dysuria Past Medical History: Past Medical History:  Diagnosis Date  . Abnormal chest sounds   . BPH (benign prostatic hypertrophy)   . Complication of anesthesia    ISSUE W/ EXTUBATION 05-18-2015 IN [facility] -- REFER TO ANESTHESIA NOTE''S  . Elevated PSA   . GERD (gastroesophageal reflux disease)   . History of airway aspiration   . History of kidney stones   . Hyperlipidemia   . Hyperplasia of prostate without lower urinary tract symptoms (LUTS)   . Left ureteral stone   . Lung nodule   . Nephrolithiasis   . Postinflammatory pulmonary fibrosis (HCC)    PULMOLOGIST-  DR JOAC-  IDIOPATHIC   . Pulmonary fibrosis ([place])   . Strawberry hemangioma of skin   . Upper airway cough syndrome    PULMOLOGIST-  DR Melvyn Novas     Past Surgical History: Past Surgical History:  Procedure Laterality Date  . CYSTOSCOPY WITH HOLMIUM LASER LITHOTRIPSY Left 06/01/2015   Procedure: CYSTOSCOPY WITH HOLMIUM LASER LITHOTRIPSY;  Surgeon: Irine Seal, MD;  Location: [facility];  Service: Urology;  Laterality: Left;  . CYSTOSCOPY WITH URETEROSCOPY, STONE BASKETRY AND STENT PLACEMENT Left 05/18/2015   Procedure: CYSTOSCOPY WITH STENT PLACEMENT AND RETROGRADE PYELOGRAM;  Surgeon: Irine Seal, MD;  Location: [facility];  Service: Urology;  Laterality: Left;  . CYSTOSCOPY WITH URETEROSCOPY, STONE BASKETRY AND STENT PLACEMENT Left 06/01/2015   Procedure: CYSTOSCOPY WITH URETEROSCOPY, STONE BASKETRY AND STENT PLACEMENT;  Surgeon: Irine Seal, MD;  Location: [facility];  Service: Urology;  Laterality: Left;  . EXTRACORPOREAL SHOCK WAVE LITHOTRIPSY  2007  . TONSILLECTOMY  age [age]     Family History: Family History  Problem Relation Age of Onset  . Heart failure Father        CHF  . Healthy Brother   . Healthy Daughter   . Healthy Daughter     Social History: Social History   Socioeconomic History  .  Marital status: Widowed    Spouse name: Not on file  . Number of children: Not on file  . Years of education: Not on file  . Highest education level: Not on file  Occupational History  . Occupation: Retired  Scientific laboratory technician  . Financial resource strain: Not on file  . Food insecurity:    Worry: Not on file    Inability: Not on file  . Transportation needs:    Medical: Not on file    Non-medical: Not on file  Tobacco Use  . Smoking status: Never Smoker  . Smokeless tobacco: Never Used  Substance and Sexual Activity  . Alcohol use: Yes    Alcohol/week: 0.0 standard drinks    Comment: occasional  . Drug use: No  . Sexual activity: Not on file  Lifestyle  . Physical activity:    Days per week: Not on file    Minutes per session: Not on file  . Stress: Not on file  Relationships  . Social connections:    Talks on phone: Not on file    Gets  together: Not on file    Attends religious service: Not on file    Active member of club or organization: Not on file    Attends meetings of clubs or organizations: Not on file    Relationship status: Not on file  Other Topics Concern  . Not on file  Social History Narrative  . Not on file    Allergies: No Known Allergies  Outpatient Meds: Current Outpatient Medications  Medication Sig Dispense Refill  . famotidine (PEPCID) 20 MG tablet Take 1 tablet (20 mg total) by mouth at bedtime. 90 tablet 3  . Nintedanib (OFEV) 150 MG CAPS Take 1 tablet by mouth 2 (two) times daily.    . pantoprazole (PROTONIX) 40 MG tablet TAKE 1 TABLET BY MOUTH EVERY MORNING( 30 TO 60 MINUTES BEFORE FIRST MEAL OF THE DAY) 30 tablet 5  . tamsulosin (FLOMAX) 0.4 MG CAPS capsule Take 1 capsule by mouth daily after breakfast.     . Na Sulfate-K Sulfate-Mg Sulf 17.5-3.13-1.6 GM/177ML SOLN Take 1 kit by mouth once for 1 dose. 354 mL 0   No current facility-administered medications for this visit.        ___________________________________________________________________ Objective   Exam:  BP 126/80   Pulse 90   Ht _0  (1.702 m)   Wt 194 lb (88 kg)   BMI 30.38 kg/m    General: this is a(n) well-appearing man  Eyes: sclera anicteric, no redness  ENT: oral mucosa moist without lesions, no cervical or supraclavicular lymphadenopathy  CV: RRR without murmur, S1/S2, no JVD, no peripheral edema  Resp: Crackles bilateral lower lung fields, normal RR and effort noted.  He ambulates without difficulty or dyspnea, speaks full sentences and is comfortable breathing at rest.  GI: soft, no tenderness, with active bowel sounds. No guarding or palpable organomegaly noted.  Skin; warm and dry, no rash or jaundice noted  Neuro: awake, alert and oriented x 3. Normal gross motor function and fluent speech   Data: Hemoglobin normal earlier this year with primary care.  Assessment: Encounter Diagnoses  Name Primary?  . Positive colorectal cancer screening using Cologuard test Yes  . Postinflammatory pulmonary fibrosis (HCC)     We discussed possible explanations of Cologuard tests, most likely 1 or more colorectal polyps, possible false positive, possible colon cancer. He is agreeable to proceed with colonoscopy.  Risks and benefits were reviewed.  Sedation risks are definitely higher in him due to underlying interstitial lung disease and reported history of aspiration.  It is unknown at present whether he aspirated under conscious or MAC sedation for during intubation for planned general anesthesia. Either way, the plan is for him to have an outpatient colonoscopy at the hospital endoscopy lab for closer anesthesia assistance.  They might even elect to intubate him for the procedure.   Thank you for the courtesy of this consult.  Please call me with any questions or concerns.  [name]  CC: Referring provider noted above

## 2018-01-21 ENCOUNTER — Encounter (HOSPITAL_COMMUNITY): Payer: Self-pay | Admitting: *Deleted

## 2018-01-21 ENCOUNTER — Encounter (HOSPITAL_COMMUNITY)
Admission: RE | Disposition: A | Discharge status: Home / Self Care | Payer: Self-pay | Source: Home / Self Care | Attending: Gastroenterology

## 2018-01-21 ENCOUNTER — Ambulatory Visit (HOSPITAL_COMMUNITY): Payer: PPO | Admitting: Certified Registered Nurse Anesthetist

## 2018-01-21 ENCOUNTER — Ambulatory Visit (HOSPITAL_COMMUNITY)
Admission: RE | Admit: 2018-01-21 | Discharge: 2018-01-21 | Disposition: A | Discharge status: Home / Self Care | Payer: PPO | Attending: Gastroenterology | Admitting: Gastroenterology

## 2018-01-21 DIAGNOSIS — J8410 Pulmonary fibrosis, unspecified: Secondary | ICD-10-CM

## 2018-01-21 DIAGNOSIS — K6289 Other specified diseases of anus and rectum: Secondary | ICD-10-CM | POA: Diagnosis not present

## 2018-01-21 DIAGNOSIS — R195 Other fecal abnormalities: Secondary | ICD-10-CM

## 2018-01-21 DIAGNOSIS — C20 Malignant neoplasm of rectum: Secondary | ICD-10-CM | POA: Insufficient documentation

## 2018-01-21 DIAGNOSIS — K5730 Diverticulosis of large intestine without perforation or abscess without bleeding: Secondary | ICD-10-CM | POA: Insufficient documentation

## 2018-01-21 DIAGNOSIS — D490 Neoplasm of unspecified behavior of digestive system: Secondary | ICD-10-CM | POA: Diagnosis not present

## 2018-01-21 HISTORY — PX: COLONOSCOPY WITH PROPOFOL: SHX5780

## 2018-01-21 HISTORY — PX: BIOPSY: SHX5522

## 2018-01-21 SURGERY — COLONOSCOPY WITH PROPOFOL
Anesthesia: Monitor Anesthesia Care

## 2018-01-21 MED ORDER — PROPOFOL 10 MG/ML IV BOLUS
INTRAVENOUS | Status: AC
  Filled 2018-01-21: qty 80

## 2018-01-21 MED ORDER — SPOT INK MARKER SYRINGE KIT
PACK | SUBMUCOSAL | Status: DC | PRN
  Administered 2018-01-21: 2.5 mL via SUBMUCOSAL

## 2018-01-21 MED ORDER — SPOT INK MARKER SYRINGE KIT
PACK | SUBMUCOSAL | Status: AC
  Filled 2018-01-21: qty 5

## 2018-01-21 MED ORDER — LACTATED RINGERS IV SOLN
INTRAVENOUS | Status: DC
  Administered 2018-01-21: 08:00:00 via INTRAVENOUS
  Administered 2018-01-21: 1000 mL via INTRAVENOUS

## 2018-01-21 MED ORDER — PROPOFOL 500 MG/50ML IV EMUL
INTRAVENOUS | Status: DC | PRN
  Administered 2018-01-21: 150 ug/kg/min via INTRAVENOUS

## 2018-01-21 MED ORDER — SODIUM CHLORIDE 0.9 % IV SOLN: INTRAVENOUS | Status: DC

## 2018-01-21 MED ORDER — PROPOFOL 10 MG/ML IV BOLUS
INTRAVENOUS | Status: DC | PRN
  Administered 2018-01-21 (×2): 20 mg via INTRAVENOUS
  Administered 2018-01-21: 10 mg via INTRAVENOUS

## 2018-01-21 MED ORDER — LIDOCAINE 2% (20 MG/ML) 5 ML SYRINGE
INTRAMUSCULAR | Status: DC | PRN
  Administered 2018-01-21: 60 mg via INTRAVENOUS

## 2018-01-21 SURGICAL SUPPLY — 22 items

## 2018-01-21 NOTE — Interval H&P Note (Signed)
History and Physical Interval Note:  01/21/2018 7:40 AM  Austin Yu  has presented today for surgery, with the diagnosis of Positive cologuard tt  The various methods of treatment have been discussed with the patient and family. After consideration of risks, benefits and other options for treatment, the patient has consented to  Procedure(s): COLONOSCOPY WITH PROPOFOL (N/A) as a surgical intervention .  The patient's history has been reviewed, patient examined, no change in status, stable for surgery.  I have reviewed the patient's chart and labs.  Questions were answered to the patient's satisfaction.     [name]

## 2018-01-21 NOTE — Anesthesia Postprocedure Evaluation (Signed)
Anesthesia Post Note  Patient: [name]  Procedure(s) Performed: COLONOSCOPY WITH PROPOFOL (N/A ) BIOPSY SUBMUCOSAL TATTOO INJECTION     Patient location during evaluation: Endoscopy Anesthesia Type: MAC Level of consciousness: awake and alert Pain management: pain level controlled Vital Signs Assessment: post-procedure vital signs reviewed and stable Respiratory status: spontaneous breathing, nonlabored ventilation, respiratory function stable and patient connected to nasal cannula oxygen Cardiovascular status: stable and blood pressure returned to baseline Postop Assessment: no apparent nausea or vomiting Anesthetic complications: no    Last Vitals:  Vitals:   01/21/18 0840 01/21/18 0850  BP: 124/81 113/83  Pulse: 73 72  Resp: 20 13  Temp:    SpO2: 98% 98%    Last Pain:  Vitals:   01/21/18 0850  TempSrc:   PainSc: 0-No pain                 Montez Hageman

## 2018-01-21 NOTE — Op Note (Signed)
[facility] Patient Name: Austin Yu Procedure Date: 01/21/2018 MRN: [id number] Attending MD: Estill Cotta. Loletha Carrow , MD Date of Birth: 1945-11-17 CSN: [id number] Age: 73 Admit Type: Outpatient Procedure:                Colonoscopy Indications:              Positive Cologuard test Providers:                Mallie Mussel L. Loletha Carrow, MD, Elmer Ramp. Tilden Dome, RN, Cherylynn Ridges, Technician, Dellie Catholic Referring MD:             Deland Pretty, MD Medicines:                Monitored Anesthesia Care Complications:            No immediate complications. Estimated Blood Loss:     Estimated blood loss was minimal. Procedure:                Pre-Anesthesia Assessment:                           - Prior to the procedure, a History and Physical                            was performed, and patient medications and                            allergies were reviewed. The patient's tolerance of                            previous anesthesia was also reviewed. The risks                            and benefits of the procedure and the sedation                            options and risks were discussed with the patient.                            All questions were answered, and informed consent                            was obtained. Prior Anticoagulants: The patient has                            taken no previous anticoagulant or antiplatelet                            agents. ASA Grade Assessment: III - A patient with                            severe systemic disease. After reviewing the risks  and benefits, the patient was deemed in                            satisfactory condition to undergo the procedure.                           After obtaining informed consent, the colonoscope                            was passed under direct vision. Throughout the                            procedure, the patient's blood pressure, pulse, and             oxygen saturations were monitored continuously. The                            CF-HQ190L ([id number]) Olympus adult colonoscope was                            introduced through the anus and advanced to the the                            terminal ileum, with identification of the                            appendiceal orifice and IC valve. The colonoscopy                            was performed without difficulty. The patient                            tolerated the procedure well. The quality of the                            bowel preparation was fair in right colon, good in                            left colon (lavage performed). The ileocecal valve,                            appendiceal orifice, and rectum were photographed. Scope In: 7:50:12 AM Scope Out: 8:12:42 AM Scope Withdrawal Time: 0 hours 20 minutes 14 seconds  Total Procedure Duration: 0 hours 22 minutes 30 seconds  Findings:      The digital rectal exam findings include a small palpable rectal mass -       anterior wall (approx 5cm from anal verge), rectal wall mobile.      Multiple diverticula were found in the left colon.      A sessile, broad-based non-obstructing mass was found in the distal       rectum. It was just below the most distal valve of [place] on the       anterior wall. The mass was non-circumferential. In addition, its       diameter measured about 20-25 mm.  Oozing was present with contact       (friable tissue). This mass was biopsied with a cold forceps for       histology. Area was tattooed with an injection of 2 mL of Spot (0.36ml       each in two injections at distal aspect of mass on opposite walls, and       two more about 3cm proximal to mass).      Retroflexion in the rectum was not performed (so as not to cause friable       tissue to bleed).      The exam was otherwise without abnormality. Impression:               - Palpable rectal mass found on digital rectal exam.                            - Diverticulosis in the left colon.                           - Rule out malignancy, tumor in the distal rectum.                            Biopsied. Tattooed.                           - The examination was otherwise normal. Moderate Sedation:      Not Applicable - Patient had care per Anesthesia. Recommendation:           - Patient has a contact number available for                            emergencies. The signs and symptoms of potential                            delayed complications were discussed with the                            patient. Return to normal activities tomorrow.                            Written discharge instructions were provided to the                            patient.                           - Resume previous diet.                           - Continue present medications.                           - Await pathology results.                           - Repeat colonoscopy is recommended for  surveillance. The colonoscopy date will be                            determined after pathology results from today's                            exam become available for review. Procedure Code(s):        --- Professional ---                           [phone], Colonoscopy, flexible; with directed                            submucosal injection(s), any substance                           [id number], Colonoscopy, flexible; with biopsy, single                            or multiple Diagnosis Code(s):        --- Professional ---                           K62.89, Other specified diseases of anus and rectum                           D49.0, Neoplasm of unspecified behavior of                            digestive system                           R19.5, Other fecal abnormalities                           K57.30, Diverticulosis of large intestine without                            perforation or abscess without bleeding CPT copyright 2018 American  Medical Association. All rights reserved. The codes documented in this report are preliminary and upon coder review may  be revised to meet current compliance requirements. Henry L. Loletha Carrow, MD 01/21/2018 8:26:30 AM This report has been signed electronically. Number of Addenda: 0

## 2018-01-21 NOTE — Anesthesia Procedure Notes (Signed)
Date/Time: 01/21/2018 7:44 AM Performed by: Claudia Desanctis, CRNA Pre-anesthesia Checklist: Patient identified, Emergency Drugs available, Suction available and Patient being monitored Patient Re-evaluated:Patient Re-evaluated prior to induction Oxygen Delivery Method: Simple face mask

## 2018-01-21 NOTE — Transfer of Care (Signed)
Immediate Anesthesia Transfer of Care Note  Patient: Austin Yu  Procedure(s) Performed: COLONOSCOPY WITH PROPOFOL (N/A ) BIOPSY SUBMUCOSAL TATTOO INJECTION  Patient Location: Endoscopy Unit  Anesthesia Type:MAC  Level of Consciousness: awake and patient cooperative  Airway & Oxygen Therapy: Patient Spontanous Breathing and Patient connected to face mask  Post-op Assessment: Report given to RN and Post -op Vital signs reviewed and stable  Post vital signs: Reviewed and stable  Last Vitals:  Vitals Value Taken Time  BP    Temp    Pulse 79 01/21/2018  8:18 AM  Resp 14 01/21/2018  8:18 AM  SpO2 100 % 01/21/2018  8:18 AM  Vitals shown include unvalidated device data.  Last Pain:  Vitals:   01/21/18 0654  TempSrc: Oral  PainSc: 0-No pain         Complications: No apparent anesthesia complications

## 2018-01-21 NOTE — Discharge Instructions (Signed)
YOU HAD AN ENDOSCOPIC PROCEDURE TODAY: Refer to the procedure report and other information in the discharge instructions given to you for any specific questions about what was found during the examination. If this information does not answer your questions, please call [place] office at [phone] to clarify.  ° °YOU SHOULD EXPECT: Some feelings of bloating in the abdomen. Passage of more gas than usual. Walking can help get rid of the air that was put into your GI tract during the procedure and reduce the bloating. If you had a lower endoscopy (such as a colonoscopy or flexible sigmoidoscopy) you may notice spotting of blood in your stool or on the toilet paper. Some abdominal soreness may be present for a day or two, also. ° °DIET: Your first meal following the procedure should be a light meal and then it is ok to progress to your normal diet. A half-sandwich or bowl of soup is an example of a good first meal. Heavy or fried foods are harder to digest and may make you feel nauseous or bloated. Drink plenty of fluids but you should avoid alcoholic beverages for 24 hours. If you had a esophageal dilation, please see attached instructions for diet.   ° °ACTIVITY: Your care partner should take you home directly after the procedure. You should plan to take it easy, moving slowly for the rest of the day. You can resume normal activity the day after the procedure however YOU SHOULD NOT DRIVE, use power tools, machinery or perform tasks that involve climbing or major physical exertion for 24 hours (because of the sedation medicines used during the test).  ° °SYMPTOMS TO REPORT IMMEDIATELY: °A gastroenterologist can be reached at any hour. Please call [phone]  for any of the following symptoms:  °Following lower endoscopy (colonoscopy, flexible sigmoidoscopy) °Excessive amounts of blood in the stool  °Significant tenderness, worsening of abdominal pains  °Swelling of the abdomen that is new, acute  °Fever of 100° or  higher  °Following upper endoscopy (EGD, EUS, ERCP, esophageal dilation) °Vomiting of blood or coffee ground material  °New, significant abdominal pain  °New, significant chest pain or pain under the shoulder blades  °Painful or persistently difficult swallowing  °New shortness of breath  °Black, tarry-looking or red, bloody stools ° °FOLLOW UP:  °If any biopsies were taken you will be contacted by phone or by letter within the next 1-3 weeks. Call [phone]  if you have not heard about the biopsies in 3 weeks.  °Please also call with any specific questions about appointments or follow up tests. ° °

## 2018-01-21 NOTE — Anesthesia Preprocedure Evaluation (Signed)
Anesthesia Evaluation  Patient identified by MRN, date of birth, ID band Patient awake    History of Anesthesia Complications (+) history of anesthetic complications  Airway Mallampati: II  TM Distance: >3 FB Neck ROM: Full    Dental  (+) Dental Advisory Given, Teeth Intact,    Pulmonary  Hx Pulm. Fibrosis No home O2 05-18-15 Chest X ray Bilateral diffuse interstitial thickening. No pleural effusion or pneumothorax. Stable cardiomediastinal silhouette. Right perihilar prominence with adjacent reticulation  No recent changes in cough or breathing, lungs clear today on examination    breath sounds clear to auscultation       Cardiovascular Exercise Tolerance: Good  Rhythm:Regular Rate:Normal     Neuro/Psych negative neurological ROS     GI/Hepatic GERD  Medicated and Controlled,  Endo/Other  negative endocrine ROS  Renal/GU negative Renal ROS     Musculoskeletal  (+) Arthritis , Osteoarthritis,    Abdominal   Peds  Hematology negative hematology ROS (+)   Anesthesia Other Findings   Reproductive/Obstetrics                             Anesthesia Physical  Anesthesia Plan  ASA: III  Anesthesia Plan: MAC   Post-op Pain Management:    Induction: Intravenous  PONV Risk Score and Plan: 1 and Treatment may vary due to age or medical condition  Airway Management Planned: Simple Face Mask  Additional Equipment: None  Intra-op Plan:   Post-operative Plan:   Informed Consent: I have reviewed the patients History and Physical, chart, labs and discussed the procedure including the risks, benefits and alternatives for the proposed anesthesia with the patient or authorized representative who has indicated his/her understanding and acceptance.   Dental advisory given  Plan Discussed with: CRNA, Anesthesiologist and Surgeon  Anesthesia Plan Comments:         Anesthesia Quick  Evaluation

## 2018-01-21 NOTE — H&P (Signed)
History:  This patient presents for endoscopic testing for positive cologuard test. See 12/27/17 office consult for details  Austin Yu Referring physician: Deland Pretty, MD  Past Medical History: Past Medical History:  Diagnosis Date  . Abnormal chest sounds   . BPH (benign prostatic hypertrophy)   . Complication of anesthesia    ISSUE W/ EXTUBATION 05-18-2015 IN [facility] -- REFER TO ANESTHESIA NOTE''S  . Elevated PSA   . GERD (gastroesophageal reflux disease)   . History of airway aspiration   . History of kidney stones   . Hyperlipidemia   . Hyperplasia of prostate without lower urinary tract symptoms (LUTS)   . Left ureteral stone   . Lung nodule   . Nephrolithiasis   . Postinflammatory pulmonary fibrosis (HCC)    PULMOLOGIST-  DR IOMB-  IDIOPATHIC   . Pulmonary fibrosis ([place])   . Strawberry hemangioma of skin   . Upper airway cough syndrome    PULMOLOGIST-  DR Melvyn Novas     Past Surgical History: Past Surgical History:  Procedure Laterality Date  . CYSTOSCOPY WITH HOLMIUM LASER LITHOTRIPSY Left 06/01/2015   Procedure: CYSTOSCOPY WITH HOLMIUM LASER LITHOTRIPSY;  Surgeon: Irine Seal, MD;  Location: [facility];  Service: Urology;  Laterality: Left;  . CYSTOSCOPY WITH URETEROSCOPY, STONE BASKETRY AND STENT PLACEMENT Left 05/18/2015   Procedure: CYSTOSCOPY WITH STENT PLACEMENT AND RETROGRADE PYELOGRAM;  Surgeon: Irine Seal, MD;  Location: [facility];  Service: Urology;  Laterality: Left;  . CYSTOSCOPY WITH URETEROSCOPY, STONE BASKETRY AND STENT PLACEMENT Left 06/01/2015   Procedure: CYSTOSCOPY WITH URETEROSCOPY, STONE BASKETRY AND STENT PLACEMENT;  Surgeon: Irine Seal, MD;  Location: [facility];  Service: Urology;  Laterality: Left;  . EXTRACORPOREAL SHOCK WAVE LITHOTRIPSY  2007  . TONSILLECTOMY  age [age]    Allergies: No Known Allergies  Outpatient Meds: Current Facility-Administered Medications  Medication Dose Route Frequency Provider Last Rate  Last Dose  . 0.9 %  sodium chloride infusion   Intravenous Continuous Danis, Estill Cotta III, MD      . lactated ringers infusion   Intravenous Continuous [name], MD 10 mL/hr at 01/21/18 0703 1,000 mL at 01/21/18 0703      ___________________________________________________________________ Objective   Exam:  BP (!) 127/91   Pulse 88   Temp 97.9 F (36.6 C) (Oral)   Resp (!) 23   Ht 5\' 7"  (1.702 m)   Wt 84.8 kg   SpO2 95%   BMI 29.28 kg/m    CV: RRR without murmur, S1/S2, no JVD, no peripheral edema  Resp: inspiratory crackles b/l, normal RR and effort noted.  Breathing comfortably on room air  GI: soft, no tenderness, with active bowel sounds. No guarding or palpable organomegaly noted.  Neuro: awake, alert and oriented x 3. Normal gross motor function and fluent speech   Assessment:  Positive cologuard  Plan:  colonoscopy   [name]

## 2018-01-22 ENCOUNTER — Encounter (HOSPITAL_COMMUNITY): Payer: Self-pay | Admitting: Gastroenterology

## 2018-01-23 ENCOUNTER — Other Ambulatory Visit: Payer: Self-pay

## 2018-01-23 DIAGNOSIS — C20 Malignant neoplasm of rectum: Secondary | ICD-10-CM

## 2018-01-24 ENCOUNTER — Other Ambulatory Visit (INDEPENDENT_AMBULATORY_CARE_PROVIDER_SITE_OTHER): Payer: PPO

## 2018-01-24 DIAGNOSIS — C20 Malignant neoplasm of rectum: Secondary | ICD-10-CM

## 2018-01-24 LAB — CBC WITH DIFFERENTIAL/PLATELET
Basophils Absolute: 0.1 10*3/uL (ref 0.0–0.1)
Basophils Relative: 0.9 % (ref 0.0–3.0)
Eosinophils Absolute: 0.3 10*3/uL (ref 0.0–0.7)
Eosinophils Relative: 4 % (ref 0.0–5.0)
HCT: 49.3 % (ref 39.0–52.0)
Hemoglobin: 16.7 g/dL (ref 13.0–17.0)
Lymphocytes Relative: 30.7 % (ref 12.0–46.0)
Lymphs Abs: 2.6 10*3/uL (ref 0.7–4.0)
MCHC: 33.9 g/dL (ref 30.0–36.0)
MCV: 91.4 fl (ref 78.0–100.0)
Monocytes Absolute: 0.7 10*3/uL (ref 0.1–1.0)
Monocytes Relative: 8.3 % (ref 3.0–12.0)
NEUTROS PCT: 56.1 % (ref 43.0–77.0)
Neutro Abs: 4.7 10*3/uL (ref 1.4–7.7)
Platelets: 252 10*3/uL (ref 150.0–400.0)
RBC: 5.39 Mil/uL (ref 4.22–5.81)
RDW: 13.7 % (ref 11.5–15.5)
WBC: 8.3 10*3/uL (ref 4.0–10.5)

## 2018-01-24 LAB — COMPREHENSIVE METABOLIC PANEL
ALT: 16 U/L (ref 0–53)
AST: 18 U/L (ref 0–37)
Albumin: 4 g/dL (ref 3.5–5.2)
Alkaline Phosphatase: 50 U/L (ref 39–117)
BUN: 16 mg/dL (ref 6–23)
CO2: 25 mEq/L (ref 19–32)
Calcium: 9.4 mg/dL (ref 8.4–10.5)
Chloride: 104 mEq/L (ref 96–112)
Creatinine, Ser: 1.31 mg/dL (ref 0.40–1.50)
GFR: 57.3 mL/min — AB (ref >60.00)
Glucose, Bld: 97 mg/dL (ref 70–99)
Potassium: 3.5 mEq/L (ref 3.5–5.1)
Sodium: 140 mEq/L (ref 135–145)
Total Bilirubin: 0.5 mg/dL (ref 0.2–1.2)
Total Protein: 7.5 g/dL (ref 6.0–8.3)

## 2018-01-25 ENCOUNTER — Telehealth: Payer: Self-pay | Admitting: Oncology

## 2018-01-25 LAB — CEA: CEA: 10.4 ng/mL — ABNORMAL HIGH

## 2018-01-25 NOTE — Telephone Encounter (Signed)
A med onc appt has been scheduled for the pt to see Dr. Benay Spice on 1/22 at 12pm. Pt has been cld and made aware of appt.

## 2018-01-28 ENCOUNTER — Ambulatory Visit (HOSPITAL_COMMUNITY)
Admission: RE | Admit: 2018-01-28 | Discharge: 2018-01-28 | Disposition: A | Discharge status: Home / Self Care | Payer: PPO | Source: Ambulatory Visit | Attending: Gastroenterology | Admitting: Gastroenterology

## 2018-01-28 DIAGNOSIS — C20 Malignant neoplasm of rectum: Secondary | ICD-10-CM | POA: Diagnosis not present

## 2018-01-28 MED ORDER — GADOBUTROL 1 MMOL/ML IV SOLN
8.0000 mL | Freq: Once | INTRAVENOUS | Status: AC | PRN
  Administered 2018-01-28: 8 mL via INTRAVENOUS

## 2018-01-29 ENCOUNTER — Ambulatory Visit (HOSPITAL_COMMUNITY)
Admission: RE | Admit: 2018-01-29 | Discharge: 2018-01-29 | Disposition: A | Discharge status: Home / Self Care | Payer: PPO | Source: Ambulatory Visit | Attending: Gastroenterology | Admitting: Gastroenterology

## 2018-01-29 ENCOUNTER — Telehealth: Payer: Self-pay

## 2018-01-29 DIAGNOSIS — J8410 Pulmonary fibrosis, unspecified: Secondary | ICD-10-CM | POA: Diagnosis not present

## 2018-01-29 DIAGNOSIS — C218 Malignant neoplasm of overlapping sites of rectum, anus and anal canal: Secondary | ICD-10-CM | POA: Diagnosis not present

## 2018-01-29 DIAGNOSIS — C20 Malignant neoplasm of rectum: Secondary | ICD-10-CM | POA: Diagnosis not present

## 2018-01-29 LAB — POCT I-STAT CREATININE: Creatinine, Ser: 1.3 mg/dL — ABNORMAL HIGH (ref 0.61–1.24)

## 2018-01-29 MED ORDER — IOHEXOL 300 MG/ML  SOLN
100.0000 mL | Freq: Once | INTRAMUSCULAR | Status: AC | PRN
  Administered 2018-01-29: 100 mL via INTRAVENOUS

## 2018-01-29 MED ORDER — SODIUM CHLORIDE (PF) 0.9 % IJ SOLN
INTRAMUSCULAR | Status: AC
  Filled 2018-01-29: qty 50

## 2018-01-29 NOTE — Telephone Encounter (Signed)
Thank you.  I gave Austin Yu results today and copied result note to his oncologists for 1/22 consult with them.

## 2018-01-29 NOTE — Progress Notes (Signed)
GI Location of Tumor / Histology: Rectal Cancer  Austin Yu presented to PCP for routine colonoscopy.  Had positive cologuard test.  20-25 mm in diameter.  CT CAP 01/29/2018: 2 cm soft tissue mass involving the right lateral rectal wall, consistent with known primary rectal carcinoma.  Tiny perirectal lymph nodes measuring up to 5 mm, suspicious for local lymph node metastases.  No other sites of metastatic disease within the abdomen or pelvis.  New 3.4 cm left infrahilar mass with mild left hilar and subcarinal lymphadenopathy.  This is highly suspicious for primary bronchogenic carcinoma rather than metastatic disease.  MRI 01/28/2018: Mesorectal Lymph Nodes >=4mm: 2 mesorectal lymph nodes are seen along the anterior right lateral wall of the rectum, largest measuring 6 mm = N1.  Extension through Muscularis Propria: Yes, measuring 4 mm = T3b  Colonoscopy 01/21/2018:   + colorectal cancer screening using cologuard test  Biopsies of Rectum 01/21/2018   Past/Anticipated interventions by surgeon, if any:   Past/Anticipated interventions by medical oncology, if any: Dr. Benay Spice 01/30/2018 12 pm  Weight changes, if any: No  Bowel/Bladder complaints, if any: No changes noted.  Nausea / Vomiting, if any: No  Pain issues, if any:  No  Any blood per rectum:   No  BP 110/76 (BP Location: Right Arm, Patient Position: Sitting)   Pulse (!) 105   Temp 97.9 F (36.6 C) (Oral)   Resp 20   Ht 5\' 7"  (1.702 m)   Wt 190 lb (86.2 kg)   SpO2 94%   BMI 29.76 kg/m    Wt Readings from Last 3 Encounters:  01/30/18 190 lb (86.2 kg)  01/21/18 186 lb 15.2 oz (84.8 kg)  12/27/17 194 lb (88 kg)    SAFETY ISSUES:  Prior radiation? No  Pacemaker/ICD? No  Possible current pregnancy? No  Is the patient on methotrexate? No  Current Complaints/Details:

## 2018-01-29 NOTE — Telephone Encounter (Signed)
Received call report from radiology regarding pts CT results. Report is in epic now. Dr. Loletha Carrow notified.

## 2018-01-30 ENCOUNTER — Ambulatory Visit
Admission: RE | Admit: 2018-01-30 | Discharge: 2018-01-30 | Disposition: A | Discharge status: Home / Self Care | Payer: PPO | Source: Ambulatory Visit | Attending: Radiation Oncology | Admitting: Radiation Oncology

## 2018-01-30 ENCOUNTER — Inpatient Hospital Stay: Payer: PPO | Attending: Oncology | Admitting: Oncology

## 2018-01-30 ENCOUNTER — Other Ambulatory Visit: Payer: Self-pay

## 2018-01-30 ENCOUNTER — Other Ambulatory Visit: Payer: Self-pay | Admitting: Radiation Oncology

## 2018-01-30 ENCOUNTER — Telehealth: Payer: Self-pay

## 2018-01-30 ENCOUNTER — Encounter: Payer: Self-pay | Admitting: Radiation Oncology

## 2018-01-30 ENCOUNTER — Ambulatory Visit: Admission: RE | Admit: 2018-01-30 | Payer: PPO | Source: Ambulatory Visit | Admitting: Radiation Oncology

## 2018-01-30 VITALS — BP 132/81 | HR 87 | Temp 97.9°F | Resp 18 | Ht 67.0 in | Wt 189.6 lb

## 2018-01-30 VITALS — BP 110/76 | HR 105 | Temp 97.9°F | Resp 20 | Ht 67.0 in | Wt 190.0 lb

## 2018-01-30 DIAGNOSIS — Z79899 Other long term (current) drug therapy: Secondary | ICD-10-CM | POA: Insufficient documentation

## 2018-01-30 DIAGNOSIS — J8410 Pulmonary fibrosis, unspecified: Secondary | ICD-10-CM | POA: Insufficient documentation

## 2018-01-30 DIAGNOSIS — Z87442 Personal history of urinary calculi: Secondary | ICD-10-CM | POA: Diagnosis not present

## 2018-01-30 DIAGNOSIS — C3490 Malignant neoplasm of unspecified part of unspecified bronchus or lung: Secondary | ICD-10-CM

## 2018-01-30 DIAGNOSIS — J849 Interstitial pulmonary disease, unspecified: Secondary | ICD-10-CM

## 2018-01-30 DIAGNOSIS — C20 Malignant neoplasm of rectum: Secondary | ICD-10-CM | POA: Diagnosis not present

## 2018-01-30 DIAGNOSIS — R918 Other nonspecific abnormal finding of lung field: Secondary | ICD-10-CM | POA: Diagnosis not present

## 2018-01-30 DIAGNOSIS — N400 Enlarged prostate without lower urinary tract symptoms: Secondary | ICD-10-CM

## 2018-01-30 DIAGNOSIS — E785 Hyperlipidemia, unspecified: Secondary | ICD-10-CM | POA: Insufficient documentation

## 2018-01-30 DIAGNOSIS — R222 Localized swelling, mass and lump, trunk: Secondary | ICD-10-CM

## 2018-01-30 DIAGNOSIS — K8020 Calculus of gallbladder without cholecystitis without obstruction: Secondary | ICD-10-CM | POA: Insufficient documentation

## 2018-01-30 DIAGNOSIS — K760 Fatty (change of) liver, not elsewhere classified: Secondary | ICD-10-CM | POA: Diagnosis not present

## 2018-01-30 DIAGNOSIS — R591 Generalized enlarged lymph nodes: Secondary | ICD-10-CM

## 2018-01-30 DIAGNOSIS — K5730 Diverticulosis of large intestine without perforation or abscess without bleeding: Secondary | ICD-10-CM | POA: Insufficient documentation

## 2018-01-30 DIAGNOSIS — K449 Diaphragmatic hernia without obstruction or gangrene: Secondary | ICD-10-CM | POA: Diagnosis not present

## 2018-01-30 DIAGNOSIS — K219 Gastro-esophageal reflux disease without esophagitis: Secondary | ICD-10-CM | POA: Insufficient documentation

## 2018-01-30 DIAGNOSIS — R05 Cough: Secondary | ICD-10-CM | POA: Diagnosis not present

## 2018-01-30 DIAGNOSIS — R599 Enlarged lymph nodes, unspecified: Secondary | ICD-10-CM | POA: Insufficient documentation

## 2018-01-30 DIAGNOSIS — R590 Localized enlarged lymph nodes: Secondary | ICD-10-CM | POA: Diagnosis not present

## 2018-01-30 NOTE — Telephone Encounter (Signed)
  Oncology Nurse Navigator Documentation     Called patient to introduce myself and to explain the role of GI navigator and provided my contact information. We discussed the information that he received today from Dr. Lisbeth Renshaw and Dr. Benay Spice. Patient eager to get bx and PET scan completed and move on to treatment.   I explained the different members of his treatment team and encouraged him to reach out if he needs additional support.  I also explained that when his treatment plan is finalized we will schedule him for a chemo education class. Details given on what will be covered. Symptom management clinic explained as well.         Patient reports that his two daughters are supportive, live in [place] and are only three hours away. His brother that accompanied him today is also supportive. No barriers to tx identified at this time. I will reach out to patient once tx plan is known and will continue to follow as needed.

## 2018-01-30 NOTE — Telephone Encounter (Signed)
Printed avs and calender of upcoming appointment. Per 1/22 los

## 2018-01-30 NOTE — Progress Notes (Signed)
[facility] New Patient Consult   Requesting MD: Deland Pretty, [address] [place] [facility] [place], [place] [address]   Austin Yu 73 y.o.  1945-09-27    Reason for Consult: Rectal cancer   HPI: Austin Yu had a Cologuard assay with Dr. Shelia Media.  The test returned positive.  He was referred to Dr. Loletha Carrow and was taken to a colonoscopy 01/21/2018.  A mass was palpated on digital rectal exam.  On colonoscopy a nonobstructing mass was found in the distal rectum.  Oozing was present with contact.  The mass was biopsied.  The area was tattooed.  The mass was estimated to begin at 5 cm from the anal verge.  The pathology revealed invasive adenocarcinoma.  CTs of the chest, abdomen, and pelvis on 01/29/2018 revealed a new left infrahilar mass compared to a chest CT from 03/22/2017.  There was an additional enlarged left hilar node and mild subcarinal lymphadenopathy.  Changes of pulmonary interstitial fibrosis again noted.  A confluent opacity is seen in an area of fibrosis in the posterior left lower lobe measuring 2 x 1.5 cm.  No liver mass.  A mass is noted at the right lateral rectum measuring 2 cm.  "Tiny "perirectal lymph nodes.  No other enlarged lymph nodes in the abdomen or pelvis. A pelvic MRI 01/28/2018 confirmed a tumor at 6.6 cm from the internal anal sphincter valve in the right anterior lateral wall of the rectum.  Tumor extends through the muscularis propria.  To mesorectal lymph nodes measure greater than 5 mm.  The tumor was staged as a T3b N1 lesion.  Austin Yu reports feeling well.  He has been evaluated in radiation oncology.  He is scheduled for a PET scan later this week.   Past Medical History:  Diagnosis Date  .  Rectal cancer, clinical stage III (T3bN1)  anyway 2020  . BPH (benign prostatic hypertrophy)   . Complication of anesthesia    ISSUE W/ EXTUBATION 05-18-2015 IN [facility] -- REFER TO ANESTHESIA NOTE''S  . Elevated PSA   . GERD (gastroesophageal  reflux disease)   . History of airway aspiration   . History of kidney stones   . Hyperlipidemia   . Hyperplasia of prostate without lower urinary tract symptoms (LUTS)   . Left ureteral stone   . Lung nodule   . Nephrolithiasis   . Postinflammatory pulmonary fibrosis (HCC)    PULMOLOGIST-  DR TWSF-  IDIOPATHIC   . Pulmonary fibrosis ([place])   . Strawberry hemangioma of skin   . Upper airway cough syndrome    PULMOLOGIST-  DR KCLE    Past Surgical History:  Procedure Laterality Date  . BIOPSY  01/21/2018   Procedure: BIOPSY;  Surgeon: Doran Stabler, MD;  Location: [facility];  Service: Gastroenterology;;  . COLONOSCOPY WITH PROPOFOL N/A 01/21/2018   Procedure: COLONOSCOPY WITH PROPOFOL;  Surgeon: Doran Stabler, MD;  Location: [facility];  Service: Gastroenterology;  Laterality: N/A;  . CYSTOSCOPY WITH HOLMIUM LASER LITHOTRIPSY Left 06/01/2015   Procedure: CYSTOSCOPY WITH HOLMIUM LASER LITHOTRIPSY;  Surgeon: Irine Seal, MD;  Location: [facility];  Service: Urology;  Laterality: Left;  . CYSTOSCOPY WITH URETEROSCOPY, STONE BASKETRY AND STENT PLACEMENT Left 05/18/2015   Procedure: CYSTOSCOPY WITH STENT PLACEMENT AND RETROGRADE PYELOGRAM;  Surgeon: Irine Seal, MD;  Location: [facility];  Service: Urology;  Laterality: Left;  . CYSTOSCOPY WITH URETEROSCOPY, STONE BASKETRY AND STENT PLACEMENT Left 06/01/2015   Procedure: CYSTOSCOPY WITH  URETEROSCOPY, STONE BASKETRY AND STENT PLACEMENT;  Surgeon: Irine Seal, MD;  Location: [facility];  Service: Urology;  Laterality: Left;  . EXTRACORPOREAL SHOCK WAVE LITHOTRIPSY  2007  . TONSILLECTOMY  age [age]    Medications: Reviewed  Allergies: No Known Allergies  Family history: He has 2 daughters.  No family history of cancer  Social History:   He lives alone in [place].  He is retired after working in [employer].  No history of tobacco use.  He reports rare alcohol use.  No transfusion history.  No risk  factor for HIV or hepatitis.  ROS:   Positives include: Nonproductive cough, chronic exertional dyspnea  A complete ROS was otherwise negative.  Physical Exam:  Blood pressure 132/81, pulse 87, temperature 97.9 F (36.6 C), temperature source Oral, resp. rate 18, height 5\' 7"  (1.702 m), weight 189 lb 9.6 oz (86 kg), SpO2 93 %.  HEENT: Oropharynx without visible mass, neck without mass Lungs: Diffuse inspiratory rales, most prominent at the lower bilateral chest, no respiratory distress Cardiac: Regular rate and rhythm Abdomen: No hepatosplenomegaly, no mass, nontender GU: Testes without mass Vascular: No leg edema Lymph nodes: No cervical, supraclavicular, axillary, or inguinal nodes Neurologic: Alert and oriented, the motor exam appears intact in the upper and lower extremities bilaterally Skin: No rash Musculoskeletal: No spine tenderness   LAB:  CBC  Lab Results  Component Value Date   WBC 8.3 01/24/2018   HGB 16.7 01/24/2018   HCT 49.3 01/24/2018   MCV 91.4 01/24/2018   PLT 252.0 01/24/2018   NEUTROABS 4.7 01/24/2018        CMP  Lab Results  Component Value Date   NA 140 01/24/2018   K 3.5 01/24/2018   CL 104 01/24/2018   CO2 25 01/24/2018   GLUCOSE 97 01/24/2018   BUN 16 01/24/2018   CREATININE 1.30 (H) 01/28/2018   CALCIUM 9.4 01/24/2018   PROT 7.5 01/24/2018   ALBUMIN 4.0 01/24/2018   AST 18 01/24/2018   ALT 16 01/24/2018   ALKPHOS 50 01/24/2018   BILITOT 0.5 01/24/2018     No results found for: CEA1  Imaging:  Ct Chest W Yu  Result Date: 01/29/2018 CLINICAL DATA:  Newly diagnosed rectal carcinoma. Staging. Pulmonary interstitial fibrosis. EXAM: CT CHEST, ABDOMEN, AND PELVIS WITH Yu TECHNIQUE: Multidetector CT imaging of the chest, abdomen and pelvis was performed following the standard protocol during bolus administration of intravenous Yu. Yu:  135mL OMNIPAQUE IOHEXOL 300 MG/ML  SOLN COMPARISON:  Noncontrast chest CT  on 03/22/2017 FINDINGS: CT CHEST FINDINGS Cardiovascular: No acute findings. Mediastinum/Lymph Nodes: A new left infrahilar mass or lymphadenopathy is seen which measures 3.4 x 2.6 cm. Additional enlarged left hilar lymph node measures 1.7 cm, and mild subcarinal lymphadenopathy with largest lymph node measuring 1.4 cm. These findings are new since previous study. Lungs/Pleura: Pulmonary interstitial fibrosis is again demonstrated. A more confluent ill-defined area of opacity is now seen within in area fibrosis in the posterior left lower lobe which measures approximately 2.0 x 1.5 cm on image 96/6. This could be due to increased confluency fibrosis or carcinoma. No evidence of pleural effusion. Musculoskeletal:  No suspicious bone lesions identified. CT ABDOMEN AND PELVIS FINDINGS Hepatobiliary: No masses identified. Mild diffuse hepatic steatosis. Gallstones are seen, however there is no evidence of cholecystitis or biliary dilatation. Pancreas:  No mass or inflammatory changes. Spleen:  Within normal limits in size and appearance. Adrenals/Urinary tract: No masses or hydronephrosis. Bilateral renal parenchymal scarring  and several small cysts. Bilateral nephrolithiasis. A 3 mm distal right ureteral calculus is seen although there is no evidence of hydroureteronephrosis. A few tiny calculi are also noted in the urinary bladder. Stomach/Bowel: A soft tissue mass involving the right lateral wall of the rectum which measures approximately 2 cm, consistent with known primary rectal carcinoma. Small hiatal hernia. No evidence of bowel obstruction or inflammatory process. Normal appendix visualized. Diverticulosis is seen mainly involving the sigmoid colon, however there is no evidence of diverticulitis. Vascular/Lymphatic: A few tiny perirectal lymph nodes are seen, largest on the right measuring 5 mm. No other pathologically enlarged lymph nodes are identified abdomen or pelvis. No evidence of abdominal aortic  aneurysm. Reproductive:  Mildly enlarged prostate. Other:  None. Musculoskeletal:  No suspicious bone lesions identified. IMPRESSION: 1. 2 cm soft tissue mass involving the right lateral rectal wall, consistent with known primary rectal carcinoma. 2. Tiny perirectal lymph nodes measuring up to 5 mm, suspicious for local lymph node metastases. No other sites of metastatic disease within the abdomen or pelvis. 3. New 3.4 cm left infrahilar mass with mild left hilar and subcarinal lymphadenopathy. This is highly suspicious for primary bronchogenic carcinoma rather than metastatic disease. PET-CT scan is recommended for further evaluation. 4. New 2 cm confluent opacity in area of fibrosis in the posterior left lower lobe. Differential diagnosis includes worsening confluent fibrosis versus bronchogenic carcinoma. This can also be assessed on PET-CT scan. Other findings: 1.  Cholelithiasis.  No radiographic evidence of cholecystitis. 2. Colonic diverticulosis, without radiographic evidence of diverticulitis. 3.  Mildly enlarged prostate. 4. 3 mm distal right ureteral calculus, without hydroureteronephrosis. 5.  Bilateral nephrolithiasis and tiny bladder calculi. 6.  Small hiatal hernia. These results will be called to the ordering clinician or representative by the Radiologist Assistant, and communication documented in the PACS or zVision Dashboard. Electronically Signed   By: Earle Gell M.D.   On: 01/29/2018 09:23   Austin Pelvis W BM Yu  Result Date: 01/28/2018 CLINICAL DATA:  Newly diagnosed rectal carcinoma.  Staging. EXAM: MRI PELVIS WITHOUT AND WITH Yu TECHNIQUE: Multiplanar multisequence Austin imaging of the pelvis was performed both before and after administration of intravenous Yu. Small amount of [place] gel was administered per rectum to optimize tumor evaluation. Yu:  8 mL Gadavist COMPARISON:  None. FINDINGS: TUMOR LOCATION Tumor distance from Anal Verge/Skin Surface:  10.6 cm Tumor distance  from Internal Anal Sphincter:  6.6 cm TUMOR DESCRIPTION Circumferential Extent: Focal polypoid intraluminal mass involves the right anterolateral wall at the 10 and 11 o'clock positions Tumor Length: 2.3 cm T - CATEGORY Extension through Muscularis Propria: Yes, measuring 4 mm = T3b Shortest Distance of any tumor/node from Mesorectal Fascia: 6 mm from right lateral perirectal lymph node to adjacent mesorectal fascia Extramural Vascular Invasion/Tumor Thrombus: No Invasion of Anterior Peritoneal Reflection: No Involvement of Adjacent Organs or Pelvic Sidewall: No Levator Ani Involvement: No N - CATEGORY Mesorectal Lymph Nodes >=76mm: 2 mesorectal lymph nodes are seen along the anterior right lateral wall of the rectum, largest measuring 6 mm = N1 Extra-mesorectal Lymphadenopathy: No Other:  None. IMPRESSION: Rectal adenocarcinoma T stage:  T3b Rectal adenocarcinoma N stage:  N1 Distance from tumor to the internal anal sphincter is 6.6 cm. Electronically Signed   By: Earle Gell M.D.   On: 01/28/2018 14:24   Ct Abdomen Pelvis W Yu  Result Date: 01/29/2018 CLINICAL DATA:  Newly diagnosed rectal carcinoma. Staging. Pulmonary interstitial fibrosis. EXAM: CT CHEST, ABDOMEN, AND PELVIS WITH  Yu TECHNIQUE: Multidetector CT imaging of the chest, abdomen and pelvis was performed following the standard protocol during bolus administration of intravenous Yu. Yu:  115mL OMNIPAQUE IOHEXOL 300 MG/ML  SOLN COMPARISON:  Noncontrast chest CT on 03/22/2017 FINDINGS: CT CHEST FINDINGS Cardiovascular: No acute findings. Mediastinum/Lymph Nodes: A new left infrahilar mass or lymphadenopathy is seen which measures 3.4 x 2.6 cm. Additional enlarged left hilar lymph node measures 1.7 cm, and mild subcarinal lymphadenopathy with largest lymph node measuring 1.4 cm. These findings are new since previous study. Lungs/Pleura: Pulmonary interstitial fibrosis is again demonstrated. A more confluent ill-defined area of  opacity is now seen within in area fibrosis in the posterior left lower lobe which measures approximately 2.0 x 1.5 cm on image 96/6. This could be due to increased confluency fibrosis or carcinoma. No evidence of pleural effusion. Musculoskeletal:  No suspicious bone lesions identified. CT ABDOMEN AND PELVIS FINDINGS Hepatobiliary: No masses identified. Mild diffuse hepatic steatosis. Gallstones are seen, however there is no evidence of cholecystitis or biliary dilatation. Pancreas:  No mass or inflammatory changes. Spleen:  Within normal limits in size and appearance. Adrenals/Urinary tract: No masses or hydronephrosis. Bilateral renal parenchymal scarring and several small cysts. Bilateral nephrolithiasis. A 3 mm distal right ureteral calculus is seen although there is no evidence of hydroureteronephrosis. A few tiny calculi are also noted in the urinary bladder. Stomach/Bowel: A soft tissue mass involving the right lateral wall of the rectum which measures approximately 2 cm, consistent with known primary rectal carcinoma. Small hiatal hernia. No evidence of bowel obstruction or inflammatory process. Normal appendix visualized. Diverticulosis is seen mainly involving the sigmoid colon, however there is no evidence of diverticulitis. Vascular/Lymphatic: A few tiny perirectal lymph nodes are seen, largest on the right measuring 5 mm. No other pathologically enlarged lymph nodes are identified abdomen or pelvis. No evidence of abdominal aortic aneurysm. Reproductive:  Mildly enlarged prostate. Other:  None. Musculoskeletal:  No suspicious bone lesions identified. IMPRESSION: 1. 2 cm soft tissue mass involving the right lateral rectal wall, consistent with known primary rectal carcinoma. 2. Tiny perirectal lymph nodes measuring up to 5 mm, suspicious for local lymph node metastases. No other sites of metastatic disease within the abdomen or pelvis. 3. New 3.4 cm left infrahilar mass with mild left hilar and  subcarinal lymphadenopathy. This is highly suspicious for primary bronchogenic carcinoma rather than metastatic disease. PET-CT scan is recommended for further evaluation. 4. New 2 cm confluent opacity in area of fibrosis in the posterior left lower lobe. Differential diagnosis includes worsening confluent fibrosis versus bronchogenic carcinoma. This can also be assessed on PET-CT scan. Other findings: 1.  Cholelithiasis.  No radiographic evidence of cholecystitis. 2. Colonic diverticulosis, without radiographic evidence of diverticulitis. 3.  Mildly enlarged prostate. 4. 3 mm distal right ureteral calculus, without hydroureteronephrosis. 5.  Bilateral nephrolithiasis and tiny bladder calculi. 6.  Small hiatal hernia. These results will be called to the ordering clinician or representative by the Radiologist Assistant, and communication documented in the PACS or zVision Dashboard. Electronically Signed   By: Earle Gell M.D.   On: 01/29/2018 09:23   CT images from 01/29/2018 reviewed   Assessment/Plan:   1. Rectal cancer  Distal rectal mass noted on colonoscopy 01/21/2018, biopsy confirmed invasive adenocarcinoma  CTs 01/29/2018- new left infrahilar mass, enlarged left hilar lymph node, mild subcarinal lymphadenopathy, confluent ill-defined lesion in area fibrosis at the posterior left lower lobe measuring 2 x 1.5 cm, rectal mass, perirectal lymph nodes  MRI  01/28/2018-T3bN1 tumor located at 6.6 cm from the internal anal sphincter, 2 mesorectal lymph nodes 2. Pulmonary fibrosis 3. Kidney stones 4. BPH 5. History of gastroesophageal reflux   Disposition:   Austin Yu has been diagnosed with rectal cancer.  He has locally advanced rectal cancer and on staging CTs was noted to have a left infrahilar mass and hilar/mediastinal lymphadenopathy.  I discussed the diagnosis of rectal cancer and the staging evaluation with Austin Yu.  He understands the findings on chest CT could represent metastatic  rectal cancer or another primary tumor site.  He is scheduled for a staging PET scan on 02/01/2018.  He has been referred to pulmonary medicine for a bronchoscopic biopsy of mediastinal/hilar lymph nodes and potentially the left lower lobe masslike area.  Austin. Trostle will return for an office visit and further discussion on 02/07/2018.  We will decide on a treatment plan when the etiology of the chest findings is better defined.  Betsy Coder, MD  01/30/2018, 12:15 PM

## 2018-01-30 NOTE — Progress Notes (Signed)
  Oncology Nurse Navigator Documentation   Mailed information on GI support group, contact information for treatment team members and note of encouragement.

## 2018-01-30 NOTE — Progress Notes (Signed)
Radiation Oncology         ([phone]) [phone] ________________________________  Name: Austin Yu        MRN: [id number]  Date of Service: 01/30/2018 DOB: [birth year]  CH:ENIDP, Austin Jew, MD  Austin, Kirke Corin, MD     REFERRING PHYSICIAN: Doran Stabler, MD   DIAGNOSIS: The encounter diagnosis was Rectal cancer [facility]).   HISTORY OF PRESENT ILLNESS: Austin Yu is a 73 y.o. male seen at the request of Dr. Loletha Carrow for a newly diagnosed rectal cancer. The patient has a history of IPF seen at [facility] by Pulmonary and with [facility]. He remains on OFEV and has been stable with his IPF per report since 2014. He recently saw his PCP and completed a cologard test that was positive and he was referred to Dr. Loletha Carrow. He proceeded with colonoscopy on 01/21/2018 and a mass in the rectum was palpated and biopsied and revealed adenocarcinoma. An MRI pelvis on 01/28/2018 revealed T3N1b disease. He also underwent CT CAP yesterday that revealed concerns for an ill defined opacity in the area of fibrosis in the LLL measuring 2 x 1/5 cm, and new left infrahilar adenopathy measuring 3.4 x 2.6 cm, and left hilar node measuring 1.7 cm, and mild subcarinal adenopathy the largest measuring 1.4 cm. These are new since his scan in March 2019. He comes today to discuss options of continued work up and plans for treatment. He is being set up to go back to [place] for bronchoscopy with EBUS sampling of his mediastinal adenopathy. He is scheduled for PET imaging at [facility] on 02/01/2018 at 11:00 am. He is scheduled to meet with Dr. Benay Spice this afternoon.    PREVIOUS RADIATION THERAPY: No   PAST MEDICAL HISTORY:  Past Medical History:  Diagnosis Date  . Abnormal chest sounds   . BPH (benign prostatic hypertrophy)   . Complication of anesthesia    ISSUE W/ EXTUBATION 05-18-2015 IN [facility] -- REFER TO ANESTHESIA NOTE''S  . Elevated PSA   . GERD (gastroesophageal reflux disease)   . History of airway aspiration   .  History of kidney stones   . Hyperlipidemia   . Hyperplasia of prostate without lower urinary tract symptoms (LUTS)   . Left ureteral stone   . Lung nodule   . Nephrolithiasis   . Postinflammatory pulmonary fibrosis (HCC)    PULMOLOGIST-  DR OEUM-  IDIOPATHIC   . Pulmonary fibrosis ([place])   . Strawberry hemangioma of skin   . Upper airway cough syndrome    PULMOLOGIST-  DR Melvyn Novas       PAST SURGICAL HISTORY: Past Surgical History:  Procedure Laterality Date  . BIOPSY  01/21/2018   Procedure: BIOPSY;  Surgeon: Doran Stabler, MD;  Location: [facility];  Service: Gastroenterology;;  . COLONOSCOPY WITH PROPOFOL N/A 01/21/2018   Procedure: COLONOSCOPY WITH PROPOFOL;  Surgeon: Doran Stabler, MD;  Location: [facility];  Service: Gastroenterology;  Laterality: N/A;  . CYSTOSCOPY WITH HOLMIUM LASER LITHOTRIPSY Left 06/01/2015   Procedure: CYSTOSCOPY WITH HOLMIUM LASER LITHOTRIPSY;  Surgeon: Irine Seal, MD;  Location: [facility];  Service: Urology;  Laterality: Left;  . CYSTOSCOPY WITH URETEROSCOPY, STONE BASKETRY AND STENT PLACEMENT Left 05/18/2015   Procedure: CYSTOSCOPY WITH STENT PLACEMENT AND RETROGRADE PYELOGRAM;  Surgeon: Irine Seal, MD;  Location: [facility];  Service: Urology;  Laterality: Left;  . CYSTOSCOPY WITH URETEROSCOPY, STONE BASKETRY AND STENT PLACEMENT Left 06/01/2015   Procedure: CYSTOSCOPY WITH URETEROSCOPY, STONE BASKETRY AND  STENT PLACEMENT;  Surgeon: Irine Seal, MD;  Location: [facility];  Service: Urology;  Laterality: Left;  . EXTRACORPOREAL SHOCK WAVE LITHOTRIPSY  2007  . TONSILLECTOMY  age [age]     FAMILY HISTORY:  Family History  Problem Relation Age of Onset  . Heart failure Father        CHF  . Healthy Brother   . Healthy Daughter   . Healthy Daughter      SOCIAL HISTORY:  reports that he has never smoked. He has never used smokeless tobacco. He reports current alcohol use. He reports that he does not use drugs. The  patient is widowed and his wife died about 20 years ago from ovarian cancer. He has young grandchildren and is very active in their lives. He is a wine distributor and also has recently had friends pass away from lung and colon cancers.    ALLERGIES: Patient has no known allergies.   MEDICATIONS:  Current Outpatient Medications  Medication Sig Dispense Refill  . famotidine (PEPCID) 20 MG tablet Take 1 tablet (20 mg total) by mouth at bedtime. 90 tablet 3  . Nintedanib (OFEV) 150 MG CAPS Take 150 mg by mouth 2 (two) times daily.     . pantoprazole (PROTONIX) 40 MG tablet TAKE 1 TABLET BY MOUTH EVERY MORNING( 30 TO 60 MINUTES BEFORE FIRST MEAL OF THE DAY) (Patient taking differently: Take 40 mg by mouth daily. ) 30 tablet 5  . tamsulosin (FLOMAX) 0.4 MG CAPS capsule Take 0.4 mg by mouth at bedtime.      No current facility-administered medications for this encounter.      REVIEW OF SYSTEMS: On review of systems, the patient reports that he is doing well overall. He denies any chest pain, shortness of breath, cough, fevers, chills, night sweats, unintended weight changes. He reports weight gain in fact. He denies any productive cough, and specifically denies any hemoptysis. He denies any bowel or bladder disturbances, and denies abdominal pain, nausea or vomiting. He denies any new musculoskeletal or joint aches or pains. A complete review of systems is obtained and is otherwise negative.     PHYSICAL EXAM:  Wt Readings from Last 3 Encounters:  01/30/18 190 lb (86.2 kg)  01/21/18 186 lb 15.2 oz (84.8 kg)  12/27/17 194 lb (88 kg)   Temp Readings from Last 3 Encounters:  01/30/18 97.9 F (36.6 C) (Oral)  01/21/18 97.6 F (36.4 C) (Oral)  06/01/15 97.7 F (36.5 C) (Oral)   BP Readings from Last 3 Encounters:  01/30/18 110/76  01/21/18 113/83  12/27/17 126/80   Pulse Readings from Last 3 Encounters:  01/30/18 (!) 105  01/21/18 72  12/27/17 90   Pain Assessment Pain Score: 0-No  pain/10  In general this is a well appearing caucasian male in no acute distress. He is alert and oriented x4 and appropriate throughout the examination. HEENT reveals that the patient is normocephalic, atraumatic. EOMs are intact.  Skin is intact without any evidence of gross lesions. Cardiovascular exam reveals a regular rate and rhythm, no clicks rubs or murmurs are auscultated. Chest reveals crackles at the left lung base which per patient is typical. Otherwise his lung fields are clear to auscultation bilaterally.  Abdomen has active bowel sounds in all quadrants and is intact. The abdomen is soft, non tender, non distended. Lower extremities are negative for pretibial pitting edema, deep calf tenderness, cyanosis or clubbing.   ECOG =0  0 - Asymptomatic (Fully active, able to  carry on all predisease activities without restriction)  1 - Symptomatic but completely ambulatory (Restricted in physically strenuous activity but ambulatory and able to carry out work of a light or sedentary nature. For example, light housework, office work)  2 - Symptomatic, <50% in bed during the day (Ambulatory and capable of all self care but unable to carry out any work activities. Up and about more than 50% of waking hours)  3 - Symptomatic, >50% in bed, but not bedbound (Capable of only limited self-care, confined to bed or chair 50% or more of waking hours)  4 - Bedbound (Completely disabled. Cannot carry on any self-care. Totally confined to bed or chair)  5 - Death   Eustace Pen MM, Creech RH, Tormey DC, et al. [phone]). "Toxicity and response criteria of the [facility] Group". [place] Oncol. 5 (6): 649-55    LABORATORY DATA:  Lab Results  Component Value Date   WBC 8.3 01/24/2018   HGB 16.7 01/24/2018   HCT 49.3 01/24/2018   MCV 91.4 01/24/2018   PLT 252.0 01/24/2018   Lab Results  Component Value Date   NA 140 01/24/2018   K 3.5 01/24/2018   CL 104 01/24/2018   CO2 25  01/24/2018   Lab Results  Component Value Date   ALT 16 01/24/2018   AST 18 01/24/2018   ALKPHOS 50 01/24/2018   BILITOT 0.5 01/24/2018      RADIOGRAPHY: Ct Chest W Contrast  Result Date: 01/29/2018 CLINICAL DATA:  Newly diagnosed rectal carcinoma. Staging. Pulmonary interstitial fibrosis. EXAM: CT CHEST, ABDOMEN, AND PELVIS WITH CONTRAST TECHNIQUE: Multidetector CT imaging of the chest, abdomen and pelvis was performed following the standard protocol during bolus administration of intravenous contrast. CONTRAST:  174mL OMNIPAQUE IOHEXOL 300 MG/ML  SOLN COMPARISON:  Noncontrast chest CT on 03/22/2017 FINDINGS: CT CHEST FINDINGS Cardiovascular: No acute findings. Mediastinum/Lymph Nodes: A new left infrahilar mass or lymphadenopathy is seen which measures 3.4 x 2.6 cm. Additional enlarged left hilar lymph node measures 1.7 cm, and mild subcarinal lymphadenopathy with largest lymph node measuring 1.4 cm. These findings are new since previous study. Lungs/Pleura: Pulmonary interstitial fibrosis is again demonstrated. A more confluent ill-defined area of opacity is now seen within in area fibrosis in the posterior left lower lobe which measures approximately 2.0 x 1.5 cm on image 96/6. This could be due to increased confluency fibrosis or carcinoma. No evidence of pleural effusion. Musculoskeletal:  No suspicious bone lesions identified. CT ABDOMEN AND PELVIS FINDINGS Hepatobiliary: No masses identified. Mild diffuse hepatic steatosis. Gallstones are seen, however there is no evidence of cholecystitis or biliary dilatation. Pancreas:  No mass or inflammatory changes. Spleen:  Within normal limits in size and appearance. Adrenals/Urinary tract: No masses or hydronephrosis. Bilateral renal parenchymal scarring and several small cysts. Bilateral nephrolithiasis. A 3 mm distal right ureteral calculus is seen although there is no evidence of hydroureteronephrosis. A few tiny calculi are also noted in the urinary  bladder. Stomach/Bowel: A soft tissue mass involving the right lateral wall of the rectum which measures approximately 2 cm, consistent with known primary rectal carcinoma. Small hiatal hernia. No evidence of bowel obstruction or inflammatory process. Normal appendix visualized. Diverticulosis is seen mainly involving the sigmoid colon, however there is no evidence of diverticulitis. Vascular/Lymphatic: A few tiny perirectal lymph nodes are seen, largest on the right measuring 5 mm. No other pathologically enlarged lymph nodes are identified abdomen or pelvis. No evidence of abdominal aortic aneurysm. Reproductive:  Mildly enlarged  prostate. Other:  None. Musculoskeletal:  No suspicious bone lesions identified. IMPRESSION: 1. 2 cm soft tissue mass involving the right lateral rectal wall, consistent with known primary rectal carcinoma. 2. Tiny perirectal lymph nodes measuring up to 5 mm, suspicious for local lymph node metastases. No other sites of metastatic disease within the abdomen or pelvis. 3. New 3.4 cm left infrahilar mass with mild left hilar and subcarinal lymphadenopathy. This is highly suspicious for primary bronchogenic carcinoma rather than metastatic disease. PET-CT scan is recommended for further evaluation. 4. New 2 cm confluent opacity in area of fibrosis in the posterior left lower lobe. Differential diagnosis includes worsening confluent fibrosis versus bronchogenic carcinoma. This can also be assessed on PET-CT scan. Other findings: 1.  Cholelithiasis.  No radiographic evidence of cholecystitis. 2. Colonic diverticulosis, without radiographic evidence of diverticulitis. 3.  Mildly enlarged prostate. 4. 3 mm distal right ureteral calculus, without hydroureteronephrosis. 5.  Bilateral nephrolithiasis and tiny bladder calculi. 6.  Small hiatal hernia. These results will be called to the ordering clinician or representative by the Radiologist Assistant, and communication documented in the PACS or  zVision Dashboard. Electronically Signed   By: Earle Gell M.D.   On: 01/29/2018 09:23   Mr Pelvis W WJ Contrast  Result Date: 01/28/2018 CLINICAL DATA:  Newly diagnosed rectal carcinoma.  Staging. EXAM: MRI PELVIS WITHOUT AND WITH CONTRAST TECHNIQUE: Multiplanar multisequence MR imaging of the pelvis was performed both before and after administration of intravenous contrast. Small amount of [place] gel was administered per rectum to optimize tumor evaluation. CONTRAST:  8 mL Gadavist COMPARISON:  None. FINDINGS: TUMOR LOCATION Tumor distance from Anal Verge/Skin Surface:  10.6 cm Tumor distance from Internal Anal Sphincter:  6.6 cm TUMOR DESCRIPTION Circumferential Extent: Focal polypoid intraluminal mass involves the right anterolateral wall at the 10 and 11 o'clock positions Tumor Length: 2.3 cm T - CATEGORY Extension through Muscularis Propria: Yes, measuring 4 mm = T3b Shortest Distance of any tumor/node from Mesorectal Fascia: 6 mm from right lateral perirectal lymph node to adjacent mesorectal fascia Extramural Vascular Invasion/Tumor Thrombus: No Invasion of Anterior Peritoneal Reflection: No Involvement of Adjacent Organs or Pelvic Sidewall: No Levator Ani Involvement: No N - CATEGORY Mesorectal Lymph Nodes >=30mm: 2 mesorectal lymph nodes are seen along the anterior right lateral wall of the rectum, largest measuring 6 mm = N1 Extra-mesorectal Lymphadenopathy: No Other:  None. IMPRESSION: Rectal adenocarcinoma T stage:  T3b Rectal adenocarcinoma N stage:  N1 Distance from tumor to the internal anal sphincter is 6.6 cm. Electronically Signed   By: Earle Gell M.D.   On: 01/28/2018 14:24   Ct Abdomen Pelvis W Contrast  Result Date: 01/29/2018 CLINICAL DATA:  Newly diagnosed rectal carcinoma. Staging. Pulmonary interstitial fibrosis. EXAM: CT CHEST, ABDOMEN, AND PELVIS WITH CONTRAST TECHNIQUE: Multidetector CT imaging of the chest, abdomen and pelvis was performed following the standard protocol during  bolus administration of intravenous contrast. CONTRAST:  188mL OMNIPAQUE IOHEXOL 300 MG/ML  SOLN COMPARISON:  Noncontrast chest CT on 03/22/2017 FINDINGS: CT CHEST FINDINGS Cardiovascular: No acute findings. Mediastinum/Lymph Nodes: A new left infrahilar mass or lymphadenopathy is seen which measures 3.4 x 2.6 cm. Additional enlarged left hilar lymph node measures 1.7 cm, and mild subcarinal lymphadenopathy with largest lymph node measuring 1.4 cm. These findings are new since previous study. Lungs/Pleura: Pulmonary interstitial fibrosis is again demonstrated. A more confluent ill-defined area of opacity is now seen within in area fibrosis in the posterior left lower lobe which measures approximately 2.0 x  1.5 cm on image 96/6. This could be due to increased confluency fibrosis or carcinoma. No evidence of pleural effusion. Musculoskeletal:  No suspicious bone lesions identified. CT ABDOMEN AND PELVIS FINDINGS Hepatobiliary: No masses identified. Mild diffuse hepatic steatosis. Gallstones are seen, however there is no evidence of cholecystitis or biliary dilatation. Pancreas:  No mass or inflammatory changes. Spleen:  Within normal limits in size and appearance. Adrenals/Urinary tract: No masses or hydronephrosis. Bilateral renal parenchymal scarring and several small cysts. Bilateral nephrolithiasis. A 3 mm distal right ureteral calculus is seen although there is no evidence of hydroureteronephrosis. A few tiny calculi are also noted in the urinary bladder. Stomach/Bowel: A soft tissue mass involving the right lateral wall of the rectum which measures approximately 2 cm, consistent with known primary rectal carcinoma. Small hiatal hernia. No evidence of bowel obstruction or inflammatory process. Normal appendix visualized. Diverticulosis is seen mainly involving the sigmoid colon, however there is no evidence of diverticulitis. Vascular/Lymphatic: A few tiny perirectal lymph nodes are seen, largest on the right  measuring 5 mm. No other pathologically enlarged lymph nodes are identified abdomen or pelvis. No evidence of abdominal aortic aneurysm. Reproductive:  Mildly enlarged prostate. Other:  None. Musculoskeletal:  No suspicious bone lesions identified. IMPRESSION: 1. 2 cm soft tissue mass involving the right lateral rectal wall, consistent with known primary rectal carcinoma. 2. Tiny perirectal lymph nodes measuring up to 5 mm, suspicious for local lymph node metastases. No other sites of metastatic disease within the abdomen or pelvis. 3. New 3.4 cm left infrahilar mass with mild left hilar and subcarinal lymphadenopathy. This is highly suspicious for primary bronchogenic carcinoma rather than metastatic disease. PET-CT scan is recommended for further evaluation. 4. New 2 cm confluent opacity in area of fibrosis in the posterior left lower lobe. Differential diagnosis includes worsening confluent fibrosis versus bronchogenic carcinoma. This can also be assessed on PET-CT scan. Other findings: 1.  Cholelithiasis.  No radiographic evidence of cholecystitis. 2. Colonic diverticulosis, without radiographic evidence of diverticulitis. 3.  Mildly enlarged prostate. 4. 3 mm distal right ureteral calculus, without hydroureteronephrosis. 5.  Bilateral nephrolithiasis and tiny bladder calculi. 6.  Small hiatal hernia. These results will be called to the ordering clinician or representative by the Radiologist Assistant, and communication documented in the PACS or zVision Dashboard. Electronically Signed   By: Earle Gell M.D.   On: 01/29/2018 09:23       IMPRESSION/PLAN: 1. Stage IIIB, cT3N1bMx adenocarcinoma of the rectum in the setting of probable Stage III NSCLC of the LLL. Dr. Lisbeth Renshaw discusses the pathology findings from his rectal biopsy. We reviewed how rectal cancers are diagnosed and NCCN guidelines for workup of these cancers. Unfortunately it appears that he may have a synchronous primary lung cancer as well. His  case has been discussed with Dr. Benay Spice and we will proceed with PET imaging for further clarification of disease. I have also reached out to Dr. Valeta Harms to see if bronchoscopy with EBUS can be performed and this will occur next week. We detailed that if he does indeed have a Stage III lung cancer, this would take priority for treatment and he would be offered concurrent ChemoRT. We also detailed the rationale for ChemoRT to treat his rectal cancer and discussed that he would also be referred to a surgeon at the appropriate time.  We discussed the risks, benefits, short, and long term effects of radiotherapy, as it is given in both scenarios, and the patient is interested in proceeding. Dr.  Lisbeth Renshaw discusses the delivery and logistics of radiotherapy and anticipates a course of 6 1/2 weeks of treatment to the LLL and regional nodes if this is indeed lung cancer, as well as 5 1/2 weeks of radiotherapy at the appropriate interval to treat his rectal cancer. We will meet back at the appropriate time to sign consent for treatment and coordinate simulation along with the results of his continued work up. He is in agreement with this plan. 2. Interstitial Lung Disease. We did discuss risks of radiotherapy as it pertains to the lungs as we would anticipate radiotherapy to the chest. We will continue to work with pulmonary medicine as well throughout the process.    The above documentation reflects my direct findings during this shared patient visit. Please see the separate note by Dr. Lisbeth Renshaw on this date for the remainder of the patient's plan of care.    Carola Rhine, PAC

## 2018-01-31 ENCOUNTER — Telehealth: Payer: Self-pay

## 2018-01-31 NOTE — Telephone Encounter (Signed)
Call made to [facility] radiology, Austin Yu is going to make Super D disc and send to office. Nothing further is needed at this time.

## 2018-02-01 ENCOUNTER — Other Ambulatory Visit: Payer: Self-pay

## 2018-02-01 ENCOUNTER — Ambulatory Visit
Admission: RE | Admit: 2018-02-01 | Discharge: 2018-02-01 | Disposition: A | Discharge status: Home / Self Care | Payer: PPO | Source: Ambulatory Visit | Attending: Radiation Oncology | Admitting: Radiation Oncology

## 2018-02-01 DIAGNOSIS — R590 Localized enlarged lymph nodes: Secondary | ICD-10-CM

## 2018-02-01 DIAGNOSIS — N202 Calculus of kidney with calculus of ureter: Secondary | ICD-10-CM | POA: Insufficient documentation

## 2018-02-01 DIAGNOSIS — C3490 Malignant neoplasm of unspecified part of unspecified bronchus or lung: Secondary | ICD-10-CM | POA: Insufficient documentation

## 2018-02-01 DIAGNOSIS — C20 Malignant neoplasm of rectum: Secondary | ICD-10-CM | POA: Diagnosis not present

## 2018-02-01 DIAGNOSIS — R918 Other nonspecific abnormal finding of lung field: Secondary | ICD-10-CM

## 2018-02-01 LAB — GLUCOSE, CAPILLARY: Glucose-Capillary: 86 mg/dL (ref 70–99)

## 2018-02-01 MED ORDER — FLUDEOXYGLUCOSE F - 18 (FDG) INJECTION
9.8000 | Freq: Once | INTRAVENOUS | Status: AC | PRN
  Administered 2018-02-01: 10.205 via INTRAVENOUS

## 2018-02-01 NOTE — Progress Notes (Signed)
Order placed for ENB/EBUS

## 2018-02-03 NOTE — Progress Notes (Signed)
_0  ID: Austin Yu, male    DOB: 1945-06-10, 73 y.o.   MRN: [id number]  Chief Complaint  Patient presents with  . Follow-up    Preop for Ebus on 02/07/2018 with Dr. Lamonte Sakai    Referring provider: Deland Pretty, MD  HPI:  73 year old male never smoker with IPF presenting to our office today for evaluation for EBUS  PMH: Rectal cancer Smoker/ Smoking History: Never smoker Maintenance:  OFEV -stopped 1 month ago Pt of: Dr. Lamonte Sakai for EBUS  02/04/2018  - Visit   73 year old male presented to our office today prior to scheduled EBUS with Dr. Lamonte Sakai on 02/07/2018 at [facility].  Patient is currently under the management of oncology Dr. Benay Spice. Recent test results below:   02/01/2018-PET scan- hypermetabolic left infrahilar mass consistent with metastatic carcinoma consider lung carcinoma versus rectal carcinoma, hypermetabolic nodule within the field of left lower lobe consolidation with differential including malignancy versus focus of infection favor malignancy, focal hypermetabolic activity within the rectum consistent with rectal carcinoma, no evidence of local nodular metastasis within the pelvis, no evidence of met metastatic disease in the abdomen  01/29/2018-CT abdomen-2 cm soft tissue mass involving the right lateral rectal wall consistent with known primary rectal carcinoma, tiny perirectal lymph nodes measuring up to 5 mm suspicious for local lymph node metastasis no other sites of metastatic disease within the abdomen or pelvis  01/29/2018-CT chest with contrast- new 3.4 cm left infrahilar mass with mild left hilar and subcarinal lymphadenopathy this is highly suspicious for primary bronchogenic carcinoma rather than metastatic disease recommend PET, new 2 cm confluent opacity in the area of fibrosis in the posterior left lower lobe   Patient is asymptomatic but does have a slightly low blood pressure today.  Patient reports that he typically does not have a low blood  pressure.  We will recheck this at the end of our office visit.  Patient with additional diagnosis of IPF which is managed by [facility].  Patient was previously on 0FEV has been on this for about a year.  Patient reporting today that he stopped taking his Ofev 1 month ago and has not contacted [facility] regarding this.  Patient's main reason for stopping of it is he did not feel like it was helping as well as it was causing him to have diarrhea.   Tests:  02/01/2018-PET scan- hypermetabolic left infrahilar mass consistent with metastatic carcinoma consider lung carcinoma versus rectal carcinoma, hypermetabolic nodule within the field of left lower lobe consolidation with differential including malignancy versus focus of infection favor malignancy, focal hypermetabolic activity within the rectum consistent with rectal carcinoma, no evidence of local nodular metastasis within the pelvis, no evidence of met metastatic disease in the abdomen  01/29/2018-CT abdomen-2 cm soft tissue mass involving the right lateral rectal wall consistent with known primary rectal carcinoma, tiny perirectal lymph nodes measuring up to 5 mm suspicious for local lymph node metastasis no other sites of metastatic disease within the abdomen or pelvis  01/29/2018-CT chest with contrast- new 3.4 cm left infrahilar mass with mild left hilar and subcarinal lymphadenopathy this is highly suspicious for primary bronchogenic carcinoma rather than metastatic disease recommend PET, new 2 cm confluent opacity in the area of fibrosis in the posterior left lower lobe  03/22/2017-high-res CT- basilar predominant fibrotic interstitial lung disease with probable early honeycombing findings represent usual interstitial pneumonia (UIP) consider high-res CT in 12 months   06/06/2016-pulmonary function test- FVC 2.15 (54% predicted), postbronchodilator ratio  91, postbronchodilator FEV1 66, no significant bronchodilator response, slight mid flow  reversibility after bronchodilator, DLCO 44 >>>Severe restriction, severe diffusion defect  FENO:  No results found for: NITRICOXIDE  PFT: PFT Results Latest Ref Rng & Units 06/06/2016 10/11/2015 04/09/2015  FVC-Pre L 2.15 2.34 2.35  FVC-Predicted Pre % 54 59 58  FVC-Post L 2.12 2.22 2.22  FVC-Predicted Post % 53 55 55  Pre FEV1/FVC % % 89 87 88  Post FEV1/FCV % % 91 90 92  FEV1-Pre L 1.90 2.03 2.06  FEV1-Predicted Pre % 65 69 70  FEV1-Post L 1.92 1.99 2.04  DLCO UNC% % 47 54 53  DLCO COR %Predicted % 104 125 111  TLC L 2.96 2.94 3.75  TLC % Predicted % 46 45 58  RV % Predicted % 36 31 63    Imaging: Ct Chest W Contrast  Result Date: 01/29/2018 CLINICAL DATA:  Newly diagnosed rectal carcinoma. Staging. Pulmonary interstitial fibrosis. EXAM: CT CHEST, ABDOMEN, AND PELVIS WITH CONTRAST TECHNIQUE: Multidetector CT imaging of the chest, abdomen and pelvis was performed following the standard protocol during bolus administration of intravenous contrast. CONTRAST:  183m OMNIPAQUE IOHEXOL 300 MG/ML  SOLN COMPARISON:  Noncontrast chest CT on 03/22/2017 FINDINGS: CT CHEST FINDINGS Cardiovascular: No acute findings. Mediastinum/Lymph Nodes: A new left infrahilar mass or lymphadenopathy is seen which measures 3.4 x 2.6 cm. Additional enlarged left hilar lymph node measures 1.7 cm, and mild subcarinal lymphadenopathy with largest lymph node measuring 1.4 cm. These findings are new since previous study. Lungs/Pleura: Pulmonary interstitial fibrosis is again demonstrated. A more confluent ill-defined area of opacity is now seen within in area fibrosis in the posterior left lower lobe which measures approximately 2.0 x 1.5 cm on image 96/6. This could be due to increased confluency fibrosis or carcinoma. No evidence of pleural effusion. Musculoskeletal:  No suspicious bone lesions identified. CT ABDOMEN AND PELVIS FINDINGS Hepatobiliary: No masses identified. Mild diffuse hepatic steatosis. Gallstones are  seen, however there is no evidence of cholecystitis or biliary dilatation. Pancreas:  No mass or inflammatory changes. Spleen:  Within normal limits in size and appearance. Adrenals/Urinary tract: No masses or hydronephrosis. Bilateral renal parenchymal scarring and several small cysts. Bilateral nephrolithiasis. A 3 mm distal right ureteral calculus is seen although there is no evidence of hydroureteronephrosis. A few tiny calculi are also noted in the urinary bladder. Stomach/Bowel: A soft tissue mass involving the right lateral wall of the rectum which measures approximately 2 cm, consistent with known primary rectal carcinoma. Small hiatal hernia. No evidence of bowel obstruction or inflammatory process. Normal appendix visualized. Diverticulosis is seen mainly involving the sigmoid colon, however there is no evidence of diverticulitis. Vascular/Lymphatic: A few tiny perirectal lymph nodes are seen, largest on the right measuring 5 mm. No other pathologically enlarged lymph nodes are identified abdomen or pelvis. No evidence of abdominal aortic aneurysm. Reproductive:  Mildly enlarged prostate. Other:  None. Musculoskeletal:  No suspicious bone lesions identified. IMPRESSION: 1. 2 cm soft tissue mass involving the right lateral rectal wall, consistent with known primary rectal carcinoma. 2. Tiny perirectal lymph nodes measuring up to 5 mm, suspicious for local lymph node metastases. No other sites of metastatic disease within the abdomen or pelvis. 3. New 3.4 cm left infrahilar mass with mild left hilar and subcarinal lymphadenopathy. This is highly suspicious for primary bronchogenic carcinoma rather than metastatic disease. PET-CT scan is recommended for further evaluation. 4. New 2 cm confluent opacity in area of fibrosis in the posterior left  lower lobe. Differential diagnosis includes worsening confluent fibrosis versus bronchogenic carcinoma. This can also be assessed on PET-CT scan. Other findings: 1.   Cholelithiasis.  No radiographic evidence of cholecystitis. 2. Colonic diverticulosis, without radiographic evidence of diverticulitis. 3.  Mildly enlarged prostate. 4. 3 mm distal right ureteral calculus, without hydroureteronephrosis. 5.  Bilateral nephrolithiasis and tiny bladder calculi. 6.  Small hiatal hernia. These results will be called to the ordering clinician or representative by the Radiologist Assistant, and communication documented in the PACS or zVision Dashboard. Electronically Signed   By: Earle Gell M.D.   On: 01/29/2018 09:23   Mr Pelvis W ZO Contrast  Result Date: 01/28/2018 CLINICAL DATA:  Newly diagnosed rectal carcinoma.  Staging. EXAM: MRI PELVIS WITHOUT AND WITH CONTRAST TECHNIQUE: Multiplanar multisequence MR imaging of the pelvis was performed both before and after administration of intravenous contrast. Small amount of [place] gel was administered per rectum to optimize tumor evaluation. CONTRAST:  8 mL Gadavist COMPARISON:  None. FINDINGS: TUMOR LOCATION Tumor distance from Anal Verge/Skin Surface:  10.6 cm Tumor distance from Internal Anal Sphincter:  6.6 cm TUMOR DESCRIPTION Circumferential Extent: Focal polypoid intraluminal mass involves the right anterolateral wall at the 10 and 11 o'clock positions Tumor Length: 2.3 cm T - CATEGORY Extension through Muscularis Propria: Yes, measuring 4 mm = T3b Shortest Distance of any tumor/node from Mesorectal Fascia: 6 mm from right lateral perirectal lymph node to adjacent mesorectal fascia Extramural Vascular Invasion/Tumor Thrombus: No Invasion of Anterior Peritoneal Reflection: No Involvement of Adjacent Organs or Pelvic Sidewall: No Levator Ani Involvement: No N - CATEGORY Mesorectal Lymph Nodes >=65m: 2 mesorectal lymph nodes are seen along the anterior right lateral wall of the rectum, largest measuring 6 mm = N1 Extra-mesorectal Lymphadenopathy: No Other:  None. IMPRESSION: Rectal adenocarcinoma T stage:  T3b Rectal adenocarcinoma N  stage:  N1 Distance from tumor to the internal anal sphincter is 6.6 cm. Electronically Signed   By: JEarle GellM.D.   On: 01/28/2018 14:24   Ct Abdomen Pelvis W Contrast  Result Date: 01/29/2018 CLINICAL DATA:  Newly diagnosed rectal carcinoma. Staging. Pulmonary interstitial fibrosis. EXAM: CT CHEST, ABDOMEN, AND PELVIS WITH CONTRAST TECHNIQUE: Multidetector CT imaging of the chest, abdomen and pelvis was performed following the standard protocol during bolus administration of intravenous contrast. CONTRAST:  1082mOMNIPAQUE IOHEXOL 300 MG/ML  SOLN COMPARISON:  Noncontrast chest CT on 03/22/2017 FINDINGS: CT CHEST FINDINGS Cardiovascular: No acute findings. Mediastinum/Lymph Nodes: A new left infrahilar mass or lymphadenopathy is seen which measures 3.4 x 2.6 cm. Additional enlarged left hilar lymph node measures 1.7 cm, and mild subcarinal lymphadenopathy with largest lymph node measuring 1.4 cm. These findings are new since previous study. Lungs/Pleura: Pulmonary interstitial fibrosis is again demonstrated. A more confluent ill-defined area of opacity is now seen within in area fibrosis in the posterior left lower lobe which measures approximately 2.0 x 1.5 cm on image 96/6. This could be due to increased confluency fibrosis or carcinoma. No evidence of pleural effusion. Musculoskeletal:  No suspicious bone lesions identified. CT ABDOMEN AND PELVIS FINDINGS Hepatobiliary: No masses identified. Mild diffuse hepatic steatosis. Gallstones are seen, however there is no evidence of cholecystitis or biliary dilatation. Pancreas:  No mass or inflammatory changes. Spleen:  Within normal limits in size and appearance. Adrenals/Urinary tract: No masses or hydronephrosis. Bilateral renal parenchymal scarring and several small cysts. Bilateral nephrolithiasis. A 3 mm distal right ureteral calculus is seen although there is no evidence of hydroureteronephrosis. A few tiny  calculi are also noted in the urinary bladder.  Stomach/Bowel: A soft tissue mass involving the right lateral wall of the rectum which measures approximately 2 cm, consistent with known primary rectal carcinoma. Small hiatal hernia. No evidence of bowel obstruction or inflammatory process. Normal appendix visualized. Diverticulosis is seen mainly involving the sigmoid colon, however there is no evidence of diverticulitis. Vascular/Lymphatic: A few tiny perirectal lymph nodes are seen, largest on the right measuring 5 mm. No other pathologically enlarged lymph nodes are identified abdomen or pelvis. No evidence of abdominal aortic aneurysm. Reproductive:  Mildly enlarged prostate. Other:  None. Musculoskeletal:  No suspicious bone lesions identified. IMPRESSION: 1. 2 cm soft tissue mass involving the right lateral rectal wall, consistent with known primary rectal carcinoma. 2. Tiny perirectal lymph nodes measuring up to 5 mm, suspicious for local lymph node metastases. No other sites of metastatic disease within the abdomen or pelvis. 3. New 3.4 cm left infrahilar mass with mild left hilar and subcarinal lymphadenopathy. This is highly suspicious for primary bronchogenic carcinoma rather than metastatic disease. PET-CT scan is recommended for further evaluation. 4. New 2 cm confluent opacity in area of fibrosis in the posterior left lower lobe. Differential diagnosis includes worsening confluent fibrosis versus bronchogenic carcinoma. This can also be assessed on PET-CT scan. Other findings: 1.  Cholelithiasis.  No radiographic evidence of cholecystitis. 2. Colonic diverticulosis, without radiographic evidence of diverticulitis. 3.  Mildly enlarged prostate. 4. 3 mm distal right ureteral calculus, without hydroureteronephrosis. 5.  Bilateral nephrolithiasis and tiny bladder calculi. 6.  Small hiatal hernia. These results will be called to the ordering clinician or representative by the Radiologist Assistant, and communication documented in the PACS or zVision  Dashboard. Electronically Signed   By: Earle Gell M.D.   On: 01/29/2018 09:23   Nm Pet Image Restag (ps) Skull Base To Thigh  Result Date: 02/01/2018 CLINICAL DATA:  Subsequent treatment strategy for lung carcinoma. Non-small cell lung cancer. EXAM: NUCLEAR MEDICINE PET SKULL BASE TO THIGH TECHNIQUE: 10.2 mCi F-18 FDG was injected intravenously. Full-ring PET imaging was performed from the skull base to thigh after the radiotracer. CT data was obtained and used for attenuation correction and anatomic localization. Fasting blood glucose: 86 mg/dl COMPARISON:  CT 01/29/2018, PET-CT 07/26/2016 FINDINGS: Mediastinal blood pool activity: SUV max 3.0 NECK: No hypermetabolic lymph nodes in the neck. Incidental CT findings: none CHEST: Intense metabolic activity associated with a LEFT infrahilar node/mass measuring 3.1 x 2.4 cm SUV max equal 6.7. Within the LEFT lower lobe, there is a segmental consolidation with central nodularity. The central nodular portion measures 1.7 x 1.1 cm with SUV max equal 8.8. These findings are new from PET-CT scan 07/26/2016. The LEFT lower lobe consolidation is increased over several exams. Incidental CT findings: none ABDOMEN/PELVIS: No abnormal activity in the liver. No hypermetabolic abdominal lymph nodes. No hypermetabolic pelvic lymph nodes. There is activity through the rectum associated with the eccentric mass SUV max equal 8.9. No hypermetabolic nodes in the pelvis. Incidental CT findings: Bilateral nonobstructing renal calculi. Small nonobstructing calculus in the distal LEFT ureter measuring 3 mm and position approximately 3 cm from the vascular junction. Several small calculi within the bladder. SKELETON: No focal hypermetabolic activity to suggest skeletal metastasis. Incidental CT findings: none IMPRESSION: 1. Hypermetabolic LEFT infrahilar mass consistent with metastatic carcinoma. Consider lung carcinoma versus rectal carcinoma. 2. Hypermetabolic nodule within a field of  LEFT lower lobe consolidation with differential including malignancy versus focus of infection. Favor malignancy. 3. Focal  hypermetabolic activity within the rectum consistent rectal carcinoma. 4. No evidence of local nodal metastasis within the pelvis. No evidence of metastatic disease in the abdomen. 5. Bilateral nephrolithiasis and nonobstructing distal RIGHT ureteral calculus. Electronically Signed   By: Suzy Bouchard M.D.   On: 02/01/2018 14:02      Specialty Problems      Pulmonary Problems   Postinflammatory pulmonary fibrosis (HCC)    Onset of cough summer 2016 > improved on GERD rx 04/09/2015  -  PFT's  04/09/2015   VC 58%   with DLCO  53/62 % corrects to 111 % for alv volume   -  PFT's  10/11/2015   FVC = 2.34 (59%) DLCO  54/58 % corrects to 125  % for alv volume   -  PFT's  06/06/2016   FVC=  2.15 (54%) DLCO  47/44c % corrects to 104  % for alv volume   - 06/06/2016  Walked RA x 3 laps @ 185 ft each stopped due to  End of study, nl pace, no sob or desat    - HRCT 06/23/16 1. Pulmonary parenchymal pattern of fibrosis is indicative of fibrotic nonspecific interstitial pneumonitis or usual interstitial pneumonitis. 2. Enlarging area of nodular ground-glass in the left lower lobe. While this may represent a superimposed infectious or inflammatory lesion, enlargement is worrisome for adenocarcinoma.  - 06/26/2016 rec Take Prednisone  4 for three days 3 for three days 2 for three days 1 for three days and stop  > repeat HRCT in 2 weeks > no change in density 07/10/2016 > rec PET  07/26/2016 . Low level FDG uptake and the patient's bilateral basilar lung disease. Low level uptake identified in a right hilar lymph node, potentially reactive. - 08/25/2016   Walked RA  2 laps @ 185 ft each stopped due to  desats to 86% rapid pace, min symptoms - 08/25/2016  ESR = 31 with GG changes on hrct so rec CTD/HSP screen> all neg >>>  pred 20 mg daily and f/u [facility]>  Seen 12/12/16 rec rx as UIP :  OFEV started  -  HRCT 03/22/16 1. Basilar predominant fibrotic interstitial lung disease with probable early honeycombing. No convincing interval progression since 07/10/2016, although with mild progression at the lung bases since 05/05/2015. Findings represent possible usual interstitial pneumonia (UIP), with fibrotic phase nonspecific interstitial pneumonia (NSIP) on the differential. Consider continued high-resolution chest CT surveillance in 12 months. 2. Stable mild mediastinal lymphadenopathy, most compatible with benign reactive adenopathy. 3. Chronic findings include: Coronary atherosclerosis. Small hiatal hernia. Diffuse hepatic steatosis. Cholelithiasis. Nonobstructing right nephrolithiasis       Upper airway cough syndrome    Trial of max gerd rx 02/01/2015 > resolved 04/09/2015          IPF (idiopathic pulmonary fibrosis) ([place])    03/22/2017-high-res CT- basilar predominant fibrotic interstitial lung disease with probable early honeycombing findings represent usual interstitial pneumonia (UIP) consider high-res CT in 12 months   Currently managed by [facility]  Started 0FEV in March/2019 Patient reports he stopped 0FEV in December/2019 d/t side effects (diarrhea)           No Known Allergies  Immunization History  Administered Date(s) Administered  . Influenza, High Dose Seasonal PF 10/09/2017  . Influenza,inj,Quad PF,6+ Mos 12/06/2016   Patient believes he sends a pneumonia vaccines at primary care or at [facility]  Past Medical History:  Diagnosis Date  . Abnormal chest sounds   . BPH (benign prostatic hypertrophy)   .  Complication of anesthesia    ISSUE W/ EXTUBATION 05-18-2015 IN [facility] -- REFER TO ANESTHESIA NOTE''S  . Elevated PSA   . GERD (gastroesophageal reflux disease)   . History of airway aspiration   . History of kidney stones   . Hyperlipidemia   . Hyperplasia of prostate without lower urinary tract symptoms (LUTS)   . Left ureteral stone   . Lung nodule     . Nephrolithiasis   . Postinflammatory pulmonary fibrosis (HCC)    PULMOLOGIST-  DR CHEN-  IDIOPATHIC   . Pulmonary fibrosis ([place])   . Strawberry hemangioma of skin   . Upper airway cough syndrome    PULMOLOGIST-  DR Melvyn Novas    Tobacco History: Social History   Tobacco Use  Smoking Status Never Smoker  Smokeless Tobacco Never Used   Counseling given: Not Answered  Continue to not smoke  Outpatient Encounter Medications as of 02/04/2018  Medication Sig  . famotidine (PEPCID) 20 MG tablet Take 1 tablet (20 mg total) by mouth at bedtime.  . Nintedanib (OFEV) 150 MG CAPS Take 150 mg by mouth 2 (two) times daily.   . pantoprazole (PROTONIX) 40 MG tablet TAKE 1 TABLET BY MOUTH EVERY MORNING( 30 TO 60 MINUTES BEFORE FIRST MEAL OF THE DAY) (Patient taking differently: Take 40 mg by mouth daily. )  . tamsulosin (FLOMAX) 0.4 MG CAPS capsule Take 0.4 mg by mouth at bedtime.    No facility-administered encounter medications on file as of 02/04/2018.      Review of Systems  Review of Systems  Constitutional: Negative for activity change, chills, fatigue, fever and unexpected weight change.  HENT: Negative for postnasal drip, rhinorrhea, sneezing and sore throat.   Eyes: Negative.   Respiratory: Negative for cough, shortness of breath and wheezing.   Cardiovascular: Negative for chest pain and palpitations.  Gastrointestinal: Positive for diarrhea (stopped OFEV - 1 month ago ). Negative for constipation, nausea and vomiting.  Endocrine: Negative.   Musculoskeletal: Negative.   Skin: Negative.   Neurological: Negative for dizziness and headaches.  Psychiatric/Behavioral: Negative.  Negative for dysphoric mood. The patient is not nervous/anxious.   All other systems reviewed and are negative.    Physical Exam  BP (!) 88/56 (BP Location: Left Arm, Cuff Size: Normal)   Pulse 98   Ht _0  (1.702 m)   Wt 190 lb 12.8 oz (86.5 kg)   SpO2 93%   BMI 29.88 kg/m   Wt Readings from Last  5 Encounters:  02/04/18 190 lb 12.8 oz (86.5 kg)  01/30/18 190 lb (86.2 kg)  01/30/18 189 lb 9.6 oz (86 kg)  01/21/18 186 lb 15.2 oz (84.8 kg)  12/27/17 194 lb (88 kg)    Physical Exam  Constitutional: He is oriented to person, place, and time and well-developed, well-nourished, and in no distress. No distress.  HENT:  Head: Normocephalic and atraumatic.  Right Ear: Hearing, tympanic membrane, external ear and ear canal normal.  Left Ear: Hearing, tympanic membrane, external ear and ear canal normal.  Nose: Nose normal. Right sinus exhibits no maxillary sinus tenderness and no frontal sinus tenderness. Left sinus exhibits no maxillary sinus tenderness and no frontal sinus tenderness.  Mouth/Throat: Uvula is midline and oropharynx is clear and moist. No oropharyngeal exudate.  Eyes: Pupils are equal, round, and reactive to light.  Neck: Normal range of motion. Neck supple.  Cardiovascular: Normal rate, regular rhythm and normal heart sounds.  Pulmonary/Chest: Effort normal and breath sounds normal. No accessory muscle  usage. No respiratory distress. He has no decreased breath sounds. He has no wheezes. He has no rhonchi.  Bibasilar crackles left greater than right  Musculoskeletal: Normal range of motion.        General: No edema.  Lymphadenopathy:    He has no cervical adenopathy.  Neurological: He is alert and oriented to person, place, and time. Gait normal.  Skin: Skin is warm and dry. He is not diaphoretic. No erythema.  Psychiatric: Mood, memory, affect and judgment normal.  Nursing note and vitals reviewed.     Lab Results:  CBC    Component Value Date/Time   WBC 8.3 01/24/2018 0730   RBC 5.39 01/24/2018 0730   HGB 16.7 01/24/2018 0730   HCT 49.3 01/24/2018 0730   PLT 252.0 01/24/2018 0730   MCV 91.4 01/24/2018 0730   MCHC 33.9 01/24/2018 0730   RDW 13.7 01/24/2018 0730   LYMPHSABS 2.6 01/24/2018 0730   MONOABS 0.7 01/24/2018 0730   EOSABS 0.3 01/24/2018 0730    BASOSABS 0.1 01/24/2018 0730    BMET    Component Value Date/Time   NA 140 01/24/2018 0730   K 3.5 01/24/2018 0730   CL 104 01/24/2018 0730   CO2 25 01/24/2018 0730   GLUCOSE 97 01/24/2018 0730   BUN 16 01/24/2018 0730   CREATININE 1.30 (H) 01/28/2018 1307   CALCIUM 9.4 01/24/2018 0730    BNP No results found for: BNP  ProBNP No results found for: PROBNP    Assessment & Plan:     Abnormal findings on diagnostic imaging of lung Assessment: 73 year old male never smoker BMI 29.8 UIP on March/2019 CT, diagnosis IPF Started 0FEV in March/2019, patient stopped this 1 month ago due to side effects Pulmonary managed by [facility] January/2020 CT chest shows new left infrahilar mass as well as hilar and subcarinal lymphadenopathy Currently arriving to our office today for EBUS by Dr. Lamonte Sakai  Plan: Proceed forward with scheduled January/30/2020 EBUS with Dr. Lamonte Sakai Follow-up with our office in 2 to 4 weeks Continue follow-up with oncology Patient could consider referral to our ILD clinic with Dr. Chase Caller or Dr. Vaughan Browner   IPF (idiopathic pulmonary fibrosis) [facility]) Assessment: 73 year old male never smoker Past medical history of GERD Caucasian March/2019 high-res CT UIP Started on 0FEV March/2019 Stopped OFEV December/2019 due to side effects Bibasilar crackles on exam today left greater than right  Plan: Notify [facility] that you stop taking OFEV Could consider Esbriet in the future Could consider referral to our ILD clinic here with Dr. Chase Caller or Dr. Vaughan Browner if you would like [place] this is closer to you Can also consider referral to pulmonary rehab in the future     Lauraine Rinne, NP 02/04/2018   This appointment was 30 min long with over 50% of the time in direct face-to-face patient care, assessment, plan of care, and follow-up.

## 2018-02-03 NOTE — H&P (View-Only) (Signed)
_0  ID: [name], male    DOB: 1945-06-10, 73 y.o.   MRN: [id number]  Chief Complaint  Patient presents with  . Follow-up    Preop for Ebus on 02/07/2018 with Dr. Lamonte Sakai    Referring provider: Deland Pretty, MD  HPI:  73 year old male never smoker with IPF presenting to our office today for evaluation for EBUS  PMH: Rectal cancer Smoker/ Smoking History: Never smoker Maintenance:  OFEV -stopped 1 month ago Pt of: Dr. Lamonte Sakai for EBUS  02/04/2018  - Visit   73 year old male presented to our office today prior to scheduled EBUS with Dr. Lamonte Sakai on 02/07/2018 at [facility].  Patient is currently under the management of oncology Dr. Benay Spice. Recent test results below:   02/01/2018-PET scan- hypermetabolic left infrahilar mass consistent with metastatic carcinoma consider lung carcinoma versus rectal carcinoma, hypermetabolic nodule within the field of left lower lobe consolidation with differential including malignancy versus focus of infection favor malignancy, focal hypermetabolic activity within the rectum consistent with rectal carcinoma, no evidence of local nodular metastasis within the pelvis, no evidence of met metastatic disease in the abdomen  01/29/2018-CT abdomen-2 cm soft tissue mass involving the right lateral rectal wall consistent with known primary rectal carcinoma, tiny perirectal lymph nodes measuring up to 5 mm suspicious for local lymph node metastasis no other sites of metastatic disease within the abdomen or pelvis  01/29/2018-CT chest with contrast- new 3.4 cm left infrahilar mass with mild left hilar and subcarinal lymphadenopathy this is highly suspicious for primary bronchogenic carcinoma rather than metastatic disease recommend PET, new 2 cm confluent opacity in the area of fibrosis in the posterior left lower lobe   Patient is asymptomatic but does have a slightly low blood pressure today.  Patient reports that he typically does not have a low blood  pressure.  We will recheck this at the end of our office visit.  Patient with additional diagnosis of IPF which is managed by [facility].  Patient was previously on 0FEV has been on this for about a year.  Patient reporting today that he stopped taking his Ofev 1 month ago and has not contacted [facility] regarding this.  Patient's main reason for stopping of it is he did not feel like it was helping as well as it was causing him to have diarrhea.   Tests:  02/01/2018-PET scan- hypermetabolic left infrahilar mass consistent with metastatic carcinoma consider lung carcinoma versus rectal carcinoma, hypermetabolic nodule within the field of left lower lobe consolidation with differential including malignancy versus focus of infection favor malignancy, focal hypermetabolic activity within the rectum consistent with rectal carcinoma, no evidence of local nodular metastasis within the pelvis, no evidence of met metastatic disease in the abdomen  01/29/2018-CT abdomen-2 cm soft tissue mass involving the right lateral rectal wall consistent with known primary rectal carcinoma, tiny perirectal lymph nodes measuring up to 5 mm suspicious for local lymph node metastasis no other sites of metastatic disease within the abdomen or pelvis  01/29/2018-CT chest with contrast- new 3.4 cm left infrahilar mass with mild left hilar and subcarinal lymphadenopathy this is highly suspicious for primary bronchogenic carcinoma rather than metastatic disease recommend PET, new 2 cm confluent opacity in the area of fibrosis in the posterior left lower lobe  03/22/2017-high-res CT- basilar predominant fibrotic interstitial lung disease with probable early honeycombing findings represent usual interstitial pneumonia (UIP) consider high-res CT in 12 months   06/06/2016-pulmonary function test- FVC 2.15 (54% predicted), postbronchodilator ratio  91, postbronchodilator FEV1 66, no significant bronchodilator response, slight mid flow  reversibility after bronchodilator, DLCO 44 >>>Severe restriction, severe diffusion defect  FENO:  No results found for: NITRICOXIDE  PFT: PFT Results Latest Ref Rng & Units 06/06/2016 10/11/2015 04/09/2015  FVC-Pre L 2.15 2.34 2.35  FVC-Predicted Pre % 54 59 58  FVC-Post L 2.12 2.22 2.22  FVC-Predicted Post % 53 55 55  Pre FEV1/FVC % % 89 87 88  Post FEV1/FCV % % 91 90 92  FEV1-Pre L 1.90 2.03 2.06  FEV1-Predicted Pre % 65 69 70  FEV1-Post L 1.92 1.99 2.04  DLCO UNC% % 47 54 53  DLCO COR %Predicted % 104 125 111  TLC L 2.96 2.94 3.75  TLC % Predicted % 46 45 58  RV % Predicted % 36 31 63    Imaging: Ct Chest W Contrast  Result Date: 01/29/2018 CLINICAL DATA:  Newly diagnosed rectal carcinoma. Staging. Pulmonary interstitial fibrosis. EXAM: CT CHEST, ABDOMEN, AND PELVIS WITH CONTRAST TECHNIQUE: Multidetector CT imaging of the chest, abdomen and pelvis was performed following the standard protocol during bolus administration of intravenous contrast. CONTRAST:  183m OMNIPAQUE IOHEXOL 300 MG/ML  SOLN COMPARISON:  Noncontrast chest CT on 03/22/2017 FINDINGS: CT CHEST FINDINGS Cardiovascular: No acute findings. Mediastinum/Lymph Nodes: A new left infrahilar mass or lymphadenopathy is seen which measures 3.4 x 2.6 cm. Additional enlarged left hilar lymph node measures 1.7 cm, and mild subcarinal lymphadenopathy with largest lymph node measuring 1.4 cm. These findings are new since previous study. Lungs/Pleura: Pulmonary interstitial fibrosis is again demonstrated. A more confluent ill-defined area of opacity is now seen within in area fibrosis in the posterior left lower lobe which measures approximately 2.0 x 1.5 cm on image 96/6. This could be due to increased confluency fibrosis or carcinoma. No evidence of pleural effusion. Musculoskeletal:  No suspicious bone lesions identified. CT ABDOMEN AND PELVIS FINDINGS Hepatobiliary: No masses identified. Mild diffuse hepatic steatosis. Gallstones are  seen, however there is no evidence of cholecystitis or biliary dilatation. Pancreas:  No mass or inflammatory changes. Spleen:  Within normal limits in size and appearance. Adrenals/Urinary tract: No masses or hydronephrosis. Bilateral renal parenchymal scarring and several small cysts. Bilateral nephrolithiasis. A 3 mm distal right ureteral calculus is seen although there is no evidence of hydroureteronephrosis. A few tiny calculi are also noted in the urinary bladder. Stomach/Bowel: A soft tissue mass involving the right lateral wall of the rectum which measures approximately 2 cm, consistent with known primary rectal carcinoma. Small hiatal hernia. No evidence of bowel obstruction or inflammatory process. Normal appendix visualized. Diverticulosis is seen mainly involving the sigmoid colon, however there is no evidence of diverticulitis. Vascular/Lymphatic: A few tiny perirectal lymph nodes are seen, largest on the right measuring 5 mm. No other pathologically enlarged lymph nodes are identified abdomen or pelvis. No evidence of abdominal aortic aneurysm. Reproductive:  Mildly enlarged prostate. Other:  None. Musculoskeletal:  No suspicious bone lesions identified. IMPRESSION: 1. 2 cm soft tissue mass involving the right lateral rectal wall, consistent with known primary rectal carcinoma. 2. Tiny perirectal lymph nodes measuring up to 5 mm, suspicious for local lymph node metastases. No other sites of metastatic disease within the abdomen or pelvis. 3. New 3.4 cm left infrahilar mass with mild left hilar and subcarinal lymphadenopathy. This is highly suspicious for primary bronchogenic carcinoma rather than metastatic disease. PET-CT scan is recommended for further evaluation. 4. New 2 cm confluent opacity in area of fibrosis in the posterior left  lower lobe. Differential diagnosis includes worsening confluent fibrosis versus bronchogenic carcinoma. This can also be assessed on PET-CT scan. Other findings: 1.   Cholelithiasis.  No radiographic evidence of cholecystitis. 2. Colonic diverticulosis, without radiographic evidence of diverticulitis. 3.  Mildly enlarged prostate. 4. 3 mm distal right ureteral calculus, without hydroureteronephrosis. 5.  Bilateral nephrolithiasis and tiny bladder calculi. 6.  Small hiatal hernia. These results will be called to the ordering clinician or representative by the Radiologist Assistant, and communication documented in the PACS or zVision Dashboard. Electronically Signed   By: Earle Gell M.D.   On: 01/29/2018 09:23   Mr Pelvis W ZO Contrast  Result Date: 01/28/2018 CLINICAL DATA:  Newly diagnosed rectal carcinoma.  Staging. EXAM: MRI PELVIS WITHOUT AND WITH CONTRAST TECHNIQUE: Multiplanar multisequence MR imaging of the pelvis was performed both before and after administration of intravenous contrast. Small amount of [place] gel was administered per rectum to optimize tumor evaluation. CONTRAST:  8 mL Gadavist COMPARISON:  None. FINDINGS: TUMOR LOCATION Tumor distance from Anal Verge/Skin Surface:  10.6 cm Tumor distance from Internal Anal Sphincter:  6.6 cm TUMOR DESCRIPTION Circumferential Extent: Focal polypoid intraluminal mass involves the right anterolateral wall at the 10 and 11 o'clock positions Tumor Length: 2.3 cm T - CATEGORY Extension through Muscularis Propria: Yes, measuring 4 mm = T3b Shortest Distance of any tumor/node from Mesorectal Fascia: 6 mm from right lateral perirectal lymph node to adjacent mesorectal fascia Extramural Vascular Invasion/Tumor Thrombus: No Invasion of Anterior Peritoneal Reflection: No Involvement of Adjacent Organs or Pelvic Sidewall: No Levator Ani Involvement: No N - CATEGORY Mesorectal Lymph Nodes >=65m: 2 mesorectal lymph nodes are seen along the anterior right lateral wall of the rectum, largest measuring 6 mm = N1 Extra-mesorectal Lymphadenopathy: No Other:  None. IMPRESSION: Rectal adenocarcinoma T stage:  T3b Rectal adenocarcinoma N  stage:  N1 Distance from tumor to the internal anal sphincter is 6.6 cm. Electronically Signed   By: JEarle GellM.D.   On: 01/28/2018 14:24   Ct Abdomen Pelvis W Contrast  Result Date: 01/29/2018 CLINICAL DATA:  Newly diagnosed rectal carcinoma. Staging. Pulmonary interstitial fibrosis. EXAM: CT CHEST, ABDOMEN, AND PELVIS WITH CONTRAST TECHNIQUE: Multidetector CT imaging of the chest, abdomen and pelvis was performed following the standard protocol during bolus administration of intravenous contrast. CONTRAST:  1082mOMNIPAQUE IOHEXOL 300 MG/ML  SOLN COMPARISON:  Noncontrast chest CT on 03/22/2017 FINDINGS: CT CHEST FINDINGS Cardiovascular: No acute findings. Mediastinum/Lymph Nodes: A new left infrahilar mass or lymphadenopathy is seen which measures 3.4 x 2.6 cm. Additional enlarged left hilar lymph node measures 1.7 cm, and mild subcarinal lymphadenopathy with largest lymph node measuring 1.4 cm. These findings are new since previous study. Lungs/Pleura: Pulmonary interstitial fibrosis is again demonstrated. A more confluent ill-defined area of opacity is now seen within in area fibrosis in the posterior left lower lobe which measures approximately 2.0 x 1.5 cm on image 96/6. This could be due to increased confluency fibrosis or carcinoma. No evidence of pleural effusion. Musculoskeletal:  No suspicious bone lesions identified. CT ABDOMEN AND PELVIS FINDINGS Hepatobiliary: No masses identified. Mild diffuse hepatic steatosis. Gallstones are seen, however there is no evidence of cholecystitis or biliary dilatation. Pancreas:  No mass or inflammatory changes. Spleen:  Within normal limits in size and appearance. Adrenals/Urinary tract: No masses or hydronephrosis. Bilateral renal parenchymal scarring and several small cysts. Bilateral nephrolithiasis. A 3 mm distal right ureteral calculus is seen although there is no evidence of hydroureteronephrosis. A few tiny  calculi are also noted in the urinary bladder.  Stomach/Bowel: A soft tissue mass involving the right lateral wall of the rectum which measures approximately 2 cm, consistent with known primary rectal carcinoma. Small hiatal hernia. No evidence of bowel obstruction or inflammatory process. Normal appendix visualized. Diverticulosis is seen mainly involving the sigmoid colon, however there is no evidence of diverticulitis. Vascular/Lymphatic: A few tiny perirectal lymph nodes are seen, largest on the right measuring 5 mm. No other pathologically enlarged lymph nodes are identified abdomen or pelvis. No evidence of abdominal aortic aneurysm. Reproductive:  Mildly enlarged prostate. Other:  None. Musculoskeletal:  No suspicious bone lesions identified. IMPRESSION: 1. 2 cm soft tissue mass involving the right lateral rectal wall, consistent with known primary rectal carcinoma. 2. Tiny perirectal lymph nodes measuring up to 5 mm, suspicious for local lymph node metastases. No other sites of metastatic disease within the abdomen or pelvis. 3. New 3.4 cm left infrahilar mass with mild left hilar and subcarinal lymphadenopathy. This is highly suspicious for primary bronchogenic carcinoma rather than metastatic disease. PET-CT scan is recommended for further evaluation. 4. New 2 cm confluent opacity in area of fibrosis in the posterior left lower lobe. Differential diagnosis includes worsening confluent fibrosis versus bronchogenic carcinoma. This can also be assessed on PET-CT scan. Other findings: 1.  Cholelithiasis.  No radiographic evidence of cholecystitis. 2. Colonic diverticulosis, without radiographic evidence of diverticulitis. 3.  Mildly enlarged prostate. 4. 3 mm distal right ureteral calculus, without hydroureteronephrosis. 5.  Bilateral nephrolithiasis and tiny bladder calculi. 6.  Small hiatal hernia. These results will be called to the ordering clinician or representative by the Radiologist Assistant, and communication documented in the PACS or zVision  Dashboard. Electronically Signed   By: Earle Gell M.D.   On: 01/29/2018 09:23   Nm Pet Image Restag (ps) Skull Base To Thigh  Result Date: 02/01/2018 CLINICAL DATA:  Subsequent treatment strategy for lung carcinoma. Non-small cell lung cancer. EXAM: NUCLEAR MEDICINE PET SKULL BASE TO THIGH TECHNIQUE: 10.2 mCi F-18 FDG was injected intravenously. Full-ring PET imaging was performed from the skull base to thigh after the radiotracer. CT data was obtained and used for attenuation correction and anatomic localization. Fasting blood glucose: 86 mg/dl COMPARISON:  CT 01/29/2018, PET-CT 07/26/2016 FINDINGS: Mediastinal blood pool activity: SUV max 3.0 NECK: No hypermetabolic lymph nodes in the neck. Incidental CT findings: none CHEST: Intense metabolic activity associated with a LEFT infrahilar node/mass measuring 3.1 x 2.4 cm SUV max equal 6.7. Within the LEFT lower lobe, there is a segmental consolidation with central nodularity. The central nodular portion measures 1.7 x 1.1 cm with SUV max equal 8.8. These findings are new from PET-CT scan 07/26/2016. The LEFT lower lobe consolidation is increased over several exams. Incidental CT findings: none ABDOMEN/PELVIS: No abnormal activity in the liver. No hypermetabolic abdominal lymph nodes. No hypermetabolic pelvic lymph nodes. There is activity through the rectum associated with the eccentric mass SUV max equal 8.9. No hypermetabolic nodes in the pelvis. Incidental CT findings: Bilateral nonobstructing renal calculi. Small nonobstructing calculus in the distal LEFT ureter measuring 3 mm and position approximately 3 cm from the vascular junction. Several small calculi within the bladder. SKELETON: No focal hypermetabolic activity to suggest skeletal metastasis. Incidental CT findings: none IMPRESSION: 1. Hypermetabolic LEFT infrahilar mass consistent with metastatic carcinoma. Consider lung carcinoma versus rectal carcinoma. 2. Hypermetabolic nodule within a field of  LEFT lower lobe consolidation with differential including malignancy versus focus of infection. Favor malignancy. 3. Focal  hypermetabolic activity within the rectum consistent rectal carcinoma. 4. No evidence of local nodal metastasis within the pelvis. No evidence of metastatic disease in the abdomen. 5. Bilateral nephrolithiasis and nonobstructing distal RIGHT ureteral calculus. Electronically Signed   By: Suzy Bouchard M.D.   On: 02/01/2018 14:02      Specialty Problems      Pulmonary Problems   Postinflammatory pulmonary fibrosis (HCC)    Onset of cough summer 2016 > improved on GERD rx 04/09/2015  -  PFT's  04/09/2015   VC 58%   with DLCO  53/62 % corrects to 111 % for alv volume   -  PFT's  10/11/2015   FVC = 2.34 (59%) DLCO  54/58 % corrects to 125  % for alv volume   -  PFT's  06/06/2016   FVC=  2.15 (54%) DLCO  47/44c % corrects to 104  % for alv volume   - 06/06/2016  Walked RA x 3 laps @ 185 ft each stopped due to  End of study, nl pace, no sob or desat    - HRCT 06/23/16 1. Pulmonary parenchymal pattern of fibrosis is indicative of fibrotic nonspecific interstitial pneumonitis or usual interstitial pneumonitis. 2. Enlarging area of nodular ground-glass in the left lower lobe. While this may represent a superimposed infectious or inflammatory lesion, enlargement is worrisome for adenocarcinoma.  - 06/26/2016 rec Take Prednisone  4 for three days 3 for three days 2 for three days 1 for three days and stop  > repeat HRCT in 2 weeks > no change in density 07/10/2016 > rec PET  07/26/2016 . Low level FDG uptake and the patient's bilateral basilar lung disease. Low level uptake identified in a right hilar lymph node, potentially reactive. - 08/25/2016   Walked RA  2 laps @ 185 ft each stopped due to  desats to 86% rapid pace, min symptoms - 08/25/2016  ESR = 31 with GG changes on hrct so rec CTD/HSP screen> all neg >>>  pred 20 mg daily and f/u [facility]>  Seen 12/12/16 rec rx as UIP :  OFEV started  -  HRCT 03/22/16 1. Basilar predominant fibrotic interstitial lung disease with probable early honeycombing. No convincing interval progression since 07/10/2016, although with mild progression at the lung bases since 05/05/2015. Findings represent possible usual interstitial pneumonia (UIP), with fibrotic phase nonspecific interstitial pneumonia (NSIP) on the differential. Consider continued high-resolution chest CT surveillance in 12 months. 2. Stable mild mediastinal lymphadenopathy, most compatible with benign reactive adenopathy. 3. Chronic findings include: Coronary atherosclerosis. Small hiatal hernia. Diffuse hepatic steatosis. Cholelithiasis. Nonobstructing right nephrolithiasis       Upper airway cough syndrome    Trial of max gerd rx 02/01/2015 > resolved 04/09/2015          IPF (idiopathic pulmonary fibrosis) ([place])    03/22/2017-high-res CT- basilar predominant fibrotic interstitial lung disease with probable early honeycombing findings represent usual interstitial pneumonia (UIP) consider high-res CT in 12 months   Currently managed by [facility]  Started 0FEV in March/2019 Patient reports he stopped 0FEV in December/2019 d/t side effects (diarrhea)           No Known Allergies  Immunization History  Administered Date(s) Administered  . Influenza, High Dose Seasonal PF 10/09/2017  . Influenza,inj,Quad PF,6+ Mos 12/06/2016   Patient believes he sends a pneumonia vaccines at primary care or at [facility]  Past Medical History:  Diagnosis Date  . Abnormal chest sounds   . BPH (benign prostatic hypertrophy)   .  Complication of anesthesia    ISSUE W/ EXTUBATION 05-18-2015 IN [facility] -- REFER TO ANESTHESIA NOTE''S  . Elevated PSA   . GERD (gastroesophageal reflux disease)   . History of airway aspiration   . History of kidney stones   . Hyperlipidemia   . Hyperplasia of prostate without lower urinary tract symptoms (LUTS)   . Left ureteral stone   . Lung nodule     . Nephrolithiasis   . Postinflammatory pulmonary fibrosis (HCC)    PULMOLOGIST-  DR CHEN-  IDIOPATHIC   . Pulmonary fibrosis ([place])   . Strawberry hemangioma of skin   . Upper airway cough syndrome    PULMOLOGIST-  DR Melvyn Novas    Tobacco History: Social History   Tobacco Use  Smoking Status Never Smoker  Smokeless Tobacco Never Used   Counseling given: Not Answered  Continue to not smoke  Outpatient Encounter Medications as of 02/04/2018  Medication Sig  . famotidine (PEPCID) 20 MG tablet Take 1 tablet (20 mg total) by mouth at bedtime.  . Nintedanib (OFEV) 150 MG CAPS Take 150 mg by mouth 2 (two) times daily.   . pantoprazole (PROTONIX) 40 MG tablet TAKE 1 TABLET BY MOUTH EVERY MORNING( 30 TO 60 MINUTES BEFORE FIRST MEAL OF THE DAY) (Patient taking differently: Take 40 mg by mouth daily. )  . tamsulosin (FLOMAX) 0.4 MG CAPS capsule Take 0.4 mg by mouth at bedtime.    No facility-administered encounter medications on file as of 02/04/2018.      Review of Systems  Review of Systems  Constitutional: Negative for activity change, chills, fatigue, fever and unexpected weight change.  HENT: Negative for postnasal drip, rhinorrhea, sneezing and sore throat.   Eyes: Negative.   Respiratory: Negative for cough, shortness of breath and wheezing.   Cardiovascular: Negative for chest pain and palpitations.  Gastrointestinal: Positive for diarrhea (stopped OFEV - 1 month ago ). Negative for constipation, nausea and vomiting.  Endocrine: Negative.   Musculoskeletal: Negative.   Skin: Negative.   Neurological: Negative for dizziness and headaches.  Psychiatric/Behavioral: Negative.  Negative for dysphoric mood. The patient is not nervous/anxious.   All other systems reviewed and are negative.    Physical Exam  BP (!) 88/56 (BP Location: Left Arm, Cuff Size: Normal)   Pulse 98   Ht _0  (1.702 m)   Wt 190 lb 12.8 oz (86.5 kg)   SpO2 93%   BMI 29.88 kg/m   Wt Readings from Last  5 Encounters:  02/04/18 190 lb 12.8 oz (86.5 kg)  01/30/18 190 lb (86.2 kg)  01/30/18 189 lb 9.6 oz (86 kg)  01/21/18 186 lb 15.2 oz (84.8 kg)  12/27/17 194 lb (88 kg)    Physical Exam  Constitutional: He is oriented to person, place, and time and well-developed, well-nourished, and in no distress. No distress.  HENT:  Head: Normocephalic and atraumatic.  Right Ear: Hearing, tympanic membrane, external ear and ear canal normal.  Left Ear: Hearing, tympanic membrane, external ear and ear canal normal.  Nose: Nose normal. Right sinus exhibits no maxillary sinus tenderness and no frontal sinus tenderness. Left sinus exhibits no maxillary sinus tenderness and no frontal sinus tenderness.  Mouth/Throat: Uvula is midline and oropharynx is clear and moist. No oropharyngeal exudate.  Eyes: Pupils are equal, round, and reactive to light.  Neck: Normal range of motion. Neck supple.  Cardiovascular: Normal rate, regular rhythm and normal heart sounds.  Pulmonary/Chest: Effort normal and breath sounds normal. No accessory muscle  usage. No respiratory distress. He has no decreased breath sounds. He has no wheezes. He has no rhonchi.  Bibasilar crackles left greater than right  Musculoskeletal: Normal range of motion.        General: No edema.  Lymphadenopathy:    He has no cervical adenopathy.  Neurological: He is alert and oriented to person, place, and time. Gait normal.  Skin: Skin is warm and dry. He is not diaphoretic. No erythema.  Psychiatric: Mood, memory, affect and judgment normal.  Nursing note and vitals reviewed.     Lab Results:  CBC    Component Value Date/Time   WBC 8.3 01/24/2018 0730   RBC 5.39 01/24/2018 0730   HGB 16.7 01/24/2018 0730   HCT 49.3 01/24/2018 0730   PLT 252.0 01/24/2018 0730   MCV 91.4 01/24/2018 0730   MCHC 33.9 01/24/2018 0730   RDW 13.7 01/24/2018 0730   LYMPHSABS 2.6 01/24/2018 0730   MONOABS 0.7 01/24/2018 0730   EOSABS 0.3 01/24/2018 0730    BASOSABS 0.1 01/24/2018 0730    BMET    Component Value Date/Time   NA 140 01/24/2018 0730   K 3.5 01/24/2018 0730   CL 104 01/24/2018 0730   CO2 25 01/24/2018 0730   GLUCOSE 97 01/24/2018 0730   BUN 16 01/24/2018 0730   CREATININE 1.30 (H) 01/28/2018 1307   CALCIUM 9.4 01/24/2018 0730    BNP No results found for: BNP  ProBNP No results found for: PROBNP    Assessment & Plan:     Abnormal findings on diagnostic imaging of lung Assessment: 73 year old male never smoker BMI 29.8 UIP on March/2019 CT, diagnosis IPF Started 0FEV in March/2019, patient stopped this 1 month ago due to side effects Pulmonary managed by [facility] January/2020 CT chest shows new left infrahilar mass as well as hilar and subcarinal lymphadenopathy Currently arriving to our office today for EBUS by Dr. Lamonte Sakai  Plan: Proceed forward with scheduled January/30/2020 EBUS with Dr. Lamonte Sakai Follow-up with our office in 2 to 4 weeks Continue follow-up with oncology Patient could consider referral to our ILD clinic with Dr. Chase Caller or Dr. Vaughan Browner   IPF (idiopathic pulmonary fibrosis) [facility]) Assessment: 73 year old male never smoker Past medical history of GERD Caucasian March/2019 high-res CT UIP Started on 0FEV March/2019 Stopped OFEV December/2019 due to side effects Bibasilar crackles on exam today left greater than right  Plan: Notify [facility] that you stop taking OFEV Could consider Esbriet in the future Could consider referral to our ILD clinic here with Dr. Chase Caller or Dr. Vaughan Browner if you would like [place] this is closer to you Can also consider referral to pulmonary rehab in the future     Lauraine Rinne, NP 02/04/2018   This appointment was 30 min long with over 50% of the time in direct face-to-face patient care, assessment, plan of care, and follow-up.

## 2018-02-04 ENCOUNTER — Telehealth: Payer: Self-pay | Admitting: Radiation Oncology

## 2018-02-04 ENCOUNTER — Telehealth: Payer: Self-pay | Admitting: Internal Medicine

## 2018-02-04 ENCOUNTER — Telehealth: Payer: Self-pay | Admitting: Emergency Medicine

## 2018-02-04 ENCOUNTER — Encounter: Payer: Self-pay | Admitting: Pulmonary Disease

## 2018-02-04 ENCOUNTER — Ambulatory Visit: Payer: PPO | Admitting: Pulmonary Disease

## 2018-02-04 VITALS — BP 88/56 | HR 98 | Ht 67.0 in | Wt 190.8 lb

## 2018-02-04 DIAGNOSIS — R911 Solitary pulmonary nodule: Secondary | ICD-10-CM

## 2018-02-04 DIAGNOSIS — R918 Other nonspecific abnormal finding of lung field: Secondary | ICD-10-CM | POA: Diagnosis not present

## 2018-02-04 DIAGNOSIS — J84112 Idiopathic pulmonary fibrosis: Secondary | ICD-10-CM | POA: Diagnosis not present

## 2018-02-04 NOTE — Telephone Encounter (Signed)
Delayed Entry. I called the patient on 02/01/2018 to review his PET scan results. He is planning to be seen on Monday 02/04/2018 for evaluation with pulmonary medicine to consider bronch ebus. He will notify us when he's scheduled for the procedure.

## 2018-02-04 NOTE — Assessment & Plan Note (Addendum)
Assessment: 74 year old male never smoker Past medical history of GERD Caucasian March/2019 high-res CT UIP Started on 0FEV March/2019 Stopped OFEV December/2019 due to side effects Bibasilar crackles on exam today left greater than right  Plan: Notify [facility] that you stop taking OFEV Could consider Esbriet in the future Could consider referral to our ILD clinic here with Dr. Chase Caller or Dr. Vaughan Browner if you would like [place] this is closer to you Can also consider referral to pulmonary rehab in the future

## 2018-02-04 NOTE — Telephone Encounter (Signed)
Spoke with Lilia Pro at [facility] Pre Admissions. She is aware that these orders have been placed and are waiting to be signed by Eric Form, NP. Nothing further was needed.

## 2018-02-04 NOTE — Telephone Encounter (Signed)
Unfortunately pt's CT chest from 1/21 cannot be converted to superD study because it was done together with CT abdomen.   Need to get a superD Ct chest without contrast for LLL nodule this weeks before his procedure on 1/30.   Thanks.

## 2018-02-04 NOTE — Assessment & Plan Note (Signed)
Assessment: 73 year old male never smoker BMI 29.8 UIP on March/2019 CT, diagnosis IPF Started 0FEV in March/2019, patient stopped this 1 month ago due to side effects Pulmonary managed by [facility] January/2020 CT chest shows new left infrahilar mass as well as hilar and subcarinal lymphadenopathy Currently arriving to our office today for EBUS by Dr. Lamonte Sakai  Plan: Proceed forward with scheduled January/30/2020 EBUS with Dr. Lamonte Sakai Follow-up with our office in 2 to 4 weeks Continue follow-up with oncology Patient could consider referral to our ILD clinic with Dr. Chase Caller or Dr. Vaughan Browner

## 2018-02-04 NOTE — Pre-Procedure Instructions (Addendum)
Austin Yu  02/04/2018      [id number] [name], [place] AT [facility] OF [name] [address] [place] [address] Phone: [phone] Fax: [phone]    Your procedure is scheduled on Thursday, January 30th.  Report to [facility] Admitting at 12:45 P.M.  Call this number if you have problems the morning of surgery:  [phone]   Remember:  Do not eat or drink after midnight.    Take these medicines the morning of surgery with A SIP OF WATER  pantoprazole (PROTONIX) Nintedanib (OFEV)    As of today, STOP taking any Aspirin (unless otherwise instructed by your surgeon), Aleve, Naproxen, Ibuprofen, Motrin, Advil, Goody's, BC's, all herbal medications, fish oil, and all vitamins.   Do not wear jewelry.  Do not wear lotions, powders, or colognes, or deodorant.  Do not shave 48 hours prior to surgery.  Men may shave face and neck.  Do not bring valuables to the hospital.  [facility] is not responsible for any belongings or valuables.  Contacts, dentures or bridgework may not be worn into surgery.  Leave your suitcase in the car.  After surgery it may be brought to your room.  For patients admitted to the hospital, discharge time will be determined by your treatment team.  Patients discharged the day of surgery will not be allowed to drive home.   Special instructions:   [place]- Preparing For Surgery  Before surgery, you can play an important role. Because skin is not sterile, your skin needs to be as free of germs as possible. You can reduce the number of germs on your skin by washing with CHG (chlorahexidine gluconate) Soap before surgery.  CHG is an antiseptic cleaner which kills germs and bonds with the skin to continue killing germs even after washing.    Oral Hygiene is also important to reduce your risk of infection.  Remember - BRUSH YOUR TEETH THE MORNING OF SURGERY WITH YOUR REGULAR  TOOTHPASTE  Please do not use if you have an allergy to CHG or antibacterial soaps. If your skin becomes reddened/irritated stop using the CHG.  Do not shave (including legs and underarms) for at least 48 hours prior to first CHG shower. It is OK to shave your face.  Please follow these instructions carefully.   1. Shower the NIGHT BEFORE SURGERY and the MORNING OF SURGERY with CHG.   2. If you chose to wash your hair, wash your hair first as usual with your normal shampoo.  3. After you shampoo, rinse your hair and body thoroughly to remove the shampoo.  4. Use CHG as you would any other liquid soap. You can apply CHG directly to the skin and wash gently with a scrungie or a clean washcloth.   5. Apply the CHG Soap to your body ONLY FROM THE NECK DOWN.  Do not use on open wounds or open sores. Avoid contact with your eyes, ears, mouth and genitals (private parts). Wash Face and genitals (private parts)  with your normal soap.  6. Wash thoroughly, paying special attention to the area where your surgery will be performed.  7. Thoroughly rinse your body with warm water from the neck down.  8. DO NOT shower/wash with your normal soap after using and rinsing off the CHG Soap.  9. Pat yourself dry with a CLEAN TOWEL.  10. Wear CLEAN PAJAMAS to bed the night before surgery,  wear comfortable clothes the morning of surgery  11. Place CLEAN SHEETS on your bed the night of your first shower and DO NOT SLEEP WITH PETS.    Day of Surgery:  Do not apply any deodorants/lotions.  Please wear clean clothes to the hospital/surgery center.   Remember to brush your teeth WITH YOUR REGULAR TOOTHPASTE.   Please read over the following fact sheets that you were given.

## 2018-02-04 NOTE — Patient Instructions (Signed)
Blood pressure is a little low today we will recheck at discharge of office visit  Okay to proceed forward with EBUS at this time >>> Please arrive at [facility] as instructed at 1230 >>> No food by mouth after 12 AM >>> Hold morning medications at this time >>> Contact our office with any questions or concerns     Please contact [facility] and let them know that you stop taking your OFEV for the last month  If you would like to schedule an appointment to see Dr. Chase Caller or Dr. Vaughan Browner in their ILD clinic here in our office please contact our office and make a self-referral  Local pulmonary fibrosis support group information listed below: Idiopathic Pulmonary Fibrosis Support Group  >>> [email or web address] >>>Contact Name: Marlane Mingle   Tentative follow-up to see Dr. Lamonte Sakai in 2 to 4 weeks after EBUS   It is flu season:   >>>Remember to be washing your hands regularly, using hand sanitizer, be careful to use around herself with has contact with people who are sick will increase her chances of getting sick yourself. >>> Best ways to protect herself from the flu: Receive the yearly flu vaccine, practice good hand hygiene washing with soap and also using hand sanitizer when available, eat a nutritious meals, get adequate rest, hydrate appropriately   Please contact the office if your symptoms worsen or you have concerns that you are not improving.   Thank you for choosing [facility] for your healthcare, and for allowing [place] to partner with you on your healthcare journey. I am thankful to be able to provide care to you today.   Wyn Quaker FNP-C

## 2018-02-04 NOTE — Telephone Encounter (Signed)
Super D CT has been ordered STAT. Nothing further was needed at this time.

## 2018-02-05 ENCOUNTER — Telehealth: Payer: Self-pay | Admitting: *Deleted

## 2018-02-05 ENCOUNTER — Encounter (HOSPITAL_COMMUNITY): Payer: Self-pay

## 2018-02-05 ENCOUNTER — Encounter (HOSPITAL_COMMUNITY)
Admission: RE | Admit: 2018-02-05 | Discharge: 2018-02-05 | Disposition: A | Discharge status: Home / Self Care | Payer: PPO | Source: Ambulatory Visit | Attending: Emergency Medicine | Admitting: Emergency Medicine

## 2018-02-05 ENCOUNTER — Ambulatory Visit (INDEPENDENT_AMBULATORY_CARE_PROVIDER_SITE_OTHER)
Admission: RE | Admit: 2018-02-05 | Discharge: 2018-02-05 | Disposition: A | Discharge status: Home / Self Care | Payer: PPO | Source: Ambulatory Visit | Attending: Emergency Medicine | Admitting: Emergency Medicine

## 2018-02-05 ENCOUNTER — Other Ambulatory Visit: Payer: Self-pay

## 2018-02-05 DIAGNOSIS — C20 Malignant neoplasm of rectum: Secondary | ICD-10-CM | POA: Diagnosis not present

## 2018-02-05 DIAGNOSIS — K449 Diaphragmatic hernia without obstruction or gangrene: Secondary | ICD-10-CM | POA: Diagnosis not present

## 2018-02-05 DIAGNOSIS — Z01812 Encounter for preprocedural laboratory examination: Secondary | ICD-10-CM

## 2018-02-05 DIAGNOSIS — Z79899 Other long term (current) drug therapy: Secondary | ICD-10-CM | POA: Diagnosis not present

## 2018-02-05 DIAGNOSIS — R590 Localized enlarged lymph nodes: Secondary | ICD-10-CM | POA: Diagnosis not present

## 2018-02-05 DIAGNOSIS — J84112 Idiopathic pulmonary fibrosis: Secondary | ICD-10-CM | POA: Diagnosis not present

## 2018-02-05 DIAGNOSIS — K219 Gastro-esophageal reflux disease without esophagitis: Secondary | ICD-10-CM | POA: Diagnosis not present

## 2018-02-05 DIAGNOSIS — R911 Solitary pulmonary nodule: Secondary | ICD-10-CM

## 2018-02-05 DIAGNOSIS — Z87442 Personal history of urinary calculi: Secondary | ICD-10-CM | POA: Diagnosis not present

## 2018-02-05 DIAGNOSIS — I2510 Atherosclerotic heart disease of native coronary artery without angina pectoris: Secondary | ICD-10-CM | POA: Diagnosis not present

## 2018-02-05 DIAGNOSIS — R918 Other nonspecific abnormal finding of lung field: Secondary | ICD-10-CM | POA: Diagnosis not present

## 2018-02-05 HISTORY — DX: Malignant (primary) neoplasm, unspecified: C80.1

## 2018-02-05 LAB — BASIC METABOLIC PANEL
Anion gap: 9 (ref 5–15)
BUN: 14 mg/dL (ref 8–23)
CO2: 25 mmol/L (ref 22–32)
Calcium: 9.4 mg/dL (ref 8.9–10.3)
Chloride: 106 mmol/L (ref 98–111)
Creatinine, Ser: 1.26 mg/dL — ABNORMAL HIGH (ref 0.61–1.24)
GFR calc Af Amer: >60 mL/min (ref >60)
GFR calc non Af Amer: 57 mL/min — ABNORMAL LOW (ref >60)
Glucose, Bld: 138 mg/dL — ABNORMAL HIGH (ref 70–99)
Potassium: 4 mmol/L (ref 3.5–5.1)
Sodium: 140 mmol/L (ref 135–145)

## 2018-02-05 LAB — CBC
HCT: 53.1 % — ABNORMAL HIGH (ref 39.0–52.0)
Hemoglobin: 16.8 g/dL (ref 13.0–17.0)
MCH: 29.3 pg (ref 26.0–34.0)
MCHC: 31.6 g/dL (ref 30.0–36.0)
MCV: 92.7 fL (ref 80.0–100.0)
Platelets: 242 10*3/uL (ref 150–400)
RBC: 5.73 MIL/uL (ref 4.22–5.81)
RDW: 12.9 % (ref 11.5–15.5)
WBC: 7.1 10*3/uL (ref 4.0–10.5)
nRBC: 0 % (ref 0.0–0.2)

## 2018-02-05 LAB — APTT: aPTT: 33 seconds (ref 24–36)

## 2018-02-05 LAB — PROTIME-INR
INR: 1.05
Prothrombin Time: 13.6 seconds (ref 11.4–15.2)

## 2018-02-05 NOTE — Progress Notes (Addendum)
PCP - Dr. Deland Pretty Cardiologist - denies  Chest x-ray - 1 view pending post procedure. EKG-N/A Stress Test -denies  ECHO - denies Cardiac Cath - denies  Sleep Study - denies  Blood Thinner Instructions: N/A Aspirin Instructions:N/A  Anesthesia review: Yes; difficult extubation in 2017, [place], [place] notified. Pulmonary following, no new interventions at this time.   Patient denies shortness of breath, fever, cough and chest pain at PAT appointment   Patient verbalized understanding of instructions that were given to them at the PAT appointment. Patient was also instructed that they will need to review over the PAT instructions again at home before surgery.

## 2018-02-05 NOTE — Telephone Encounter (Signed)
Left VM requesting to reschedule his 02/07/18 office visit due to having a biopsy procedure the same day. Scheduling message sent.

## 2018-02-06 ENCOUNTER — Telehealth: Payer: Self-pay | Admitting: Oncology

## 2018-02-06 NOTE — Anesthesia Preprocedure Evaluation (Addendum)
Anesthesia Evaluation  Patient identified by MRN, date of birth, ID band Patient awake    Reviewed: Allergy & Precautions, NPO status , Patient's Chart, lab work & pertinent test results  Airway Mallampati: III  TM Distance: >3 FB Neck ROM: Full    Dental no notable dental hx.    Pulmonary  Postinflammatory pulmonary fibrosis  PULMOLOGIST-  DR Melvyn Novas-  IDIOPATHIC Upper airway cough syndrome History of low normal sats    Pulmonary exam normal breath sounds clear to auscultation       Cardiovascular negative cardio ROS Normal cardiovascular exam Rhythm:Regular Rate:Normal     Neuro/Psych negative neurological ROS  negative psych ROS   GI/Hepatic Neg liver ROS, GERD  Medicated and Controlled,  Endo/Other  negative endocrine ROS  Renal/GU negative Renal ROS     Musculoskeletal negative musculoskeletal ROS (+)   Abdominal   Peds  Hematology HLD   Anesthesia Other Findings LUNG MASS  Reproductive/Obstetrics                            Anesthesia Physical Anesthesia Plan  ASA: II  Anesthesia Plan: General   Post-op Pain Management:    Induction: Intravenous  PONV Risk Score and Plan: 2 and Ondansetron, Dexamethasone and Treatment may vary due to age or medical condition  Airway Management Planned: Oral ETT  Additional Equipment:   Intra-op Plan:   Post-operative Plan: Extubation in OR  Informed Consent: I have reviewed the patients History and Physical, chart, labs and discussed the procedure including the risks, benefits and alternatives for the proposed anesthesia with the patient or authorized representative who has indicated his/her understanding and acceptance.     Dental advisory given  Plan Discussed with: CRNA  Anesthesia Plan Comments: (Reviewed PAT note by Karoline Caldwell, PA-C Pre:op - duoneb and IS Intra:op - LTA )      Anesthesia Quick Evaluation

## 2018-02-06 NOTE — Telephone Encounter (Signed)
Scheduled appt per 1/28 sch message - pt is aware aware of appt date and time

## 2018-02-06 NOTE — Progress Notes (Signed)
Anesthesia Chart Review:  Case:  [id number] Date/Time:  02/07/18 1430   Procedures:      VIDEO BRONCHOSCOPY WITH ENDOBRONCHIAL ULTRASOUND (Left )     VIDEO BRONCHOSCOPY WITH ENDOBRONCHIAL NAVIGATION (Left )   Anesthesia type:  General   Pre-op diagnosis:  LUNG MASS   Location:  [facility] OR ROOM 10 / [place] OR   Surgeon:  Collene Gobble, MD      DISCUSSION: 73 yo male never smoker. Pertinent hx includes Postinflammatory pulmonary fibrosis, Upper airway cough synrome, GERD.  Pt follows with pulmonology at [facility] for Idiopathic pulmonary fibrosis. Per OV note 10/23/2017 pt was relatively stable, did report some DOE but none at rest. Stated he had been tolerating OFEV well. However recent notes from pulmonology at [facility] indicate the pt stopped OFEV on his own due to diarrhea, but did not inform [facility].  Review of anesthesia records shows pt had a difficult extubation 05/18/2015: "Mr. Lupinski had a rocky emergence from general anesthesia with coughing and refusal to breathe regularly. CXR obtained in PACU is without acute changes. Chronic interstitial fibrotic changes noted.  He is now without complaint and oxygen saturation is 92-93% on room air. Breathing unlabored. He has been given an incentive spirometer. Discussed events with Mr. Curran. He understands his pulmonary condition and will seek care if any new symptoms."  He again had GA on 06/01/2015 and per anesthesia post note: "Patient with pulmonary fibrosis, strong cough reflex and hyperactive upper airway. Tolerated deep general anesthesia with intermittent coughing and breath holds through out. Would consider pre treatment with albuterol neb (even without wheezing) and nebulized lidocaine to decrease airway hyperactivity and upper airway reflexes."  Anticipate he can proceed as planned barring acute status change.   VS: BP 136/82   Pulse 100   Temp 36.8 C   Ht 5\' 7"  (1.702 m)   Wt 86 kg   SpO2 93%   BMI 29.68 kg/m   PROVIDERS: Deland Pretty, MD is  PCP  Mahlon Gammon, MD is Pulmonologist  LABS: Labs reviewed: Acceptable for surgery. (all labs ordered are listed, but only abnormal results are displayed)  Labs Reviewed  BASIC METABOLIC PANEL - Abnormal; Notable for the following components:      Result Value   Glucose, Bld 138 (*)    Creatinine, Ser 1.26 (*)    GFR calc non Af Amer 57 (*)    All other components within normal limits  CBC - Abnormal; Notable for the following components:   HCT 53.1 (*)    All other components within normal limits  APTT  PROTIME-INR     IMAGES: CT Chest Super D 02/05/2018: IMPRESSION: 1. Persistent, hypermetabolic left lower lobe nodule measuring 2 cm. 2. Again seen are enlarged hypermetabolic left hilar and mediastinal lymph nodes. Suspicious for metastatic adenopathy. 3. Similar appearance of chronic interstitial lung disease compatible with pulmonary fibrosis. 4. Gallstones    EKG: 04/03/2016 (outside record, copy on pt chart): Sinus rhythm. Rate 74.   PULM Pulmonary Function Test (PFT) Latest Ref Rng & Units 12/12/2016 and 10/23/2017  FVC PRE L 1.99 1.93  FVC % PRE PRED % 49 48  FEV1 PRE L 1.58 1.58  FEV1 % PRE PRED % 50 50  FEV1/FVC PRE % 79.24 81.74  TLC PRE L 2.98 -  TLC % PRE PRED % 47 -  RV PRE L 1 -  RV % PRE PRED % 44 -  DLCO PRE ml/(min*mmHg) 9.86 9.87  DLCO % PRE PRED %  41 41  FEF25-75% PRE L/s 1.4 1.76  FEF25-75% % PRE PRED % 48 60    CV: N/A  Past Medical History:  Diagnosis Date  . Abnormal chest sounds   . BPH (benign prostatic hypertrophy)   . Cancer [facility])    rectum cancer; dx 01/21/2018 by Dr. Loletha Carrow by colonoscopy  . Complication of anesthesia    ISSUE W/ EXTUBATION 05-18-2015 IN [facility] -- REFER TO ANESTHESIA NOTE''S  . Elevated PSA   . GERD (gastroesophageal reflux disease)   . History of airway aspiration   . History of kidney stones   . Hyperlipidemia   . Hyperplasia of prostate without lower urinary tract symptoms (LUTS)   . Left ureteral  stone   . Lung nodule   . Nephrolithiasis   . Postinflammatory pulmonary fibrosis (H[facility])    PULMOLOGIST-  DR JJOA-  IDIOPATHIC   . Pulmonary fibrosis ([place])   . Strawberry hemangioma of skin   . Upper airway cough syndrome    PULMOLOGIST-  DR CZYS    Past Surgical History:  Procedure Laterality Date  . BIOPSY  01/21/2018   Procedure: BIOPSY;  Surgeon: Doran Stabler, MD;  Location: [facility];  Service: Gastroenterology;;  . COLONOSCOPY WITH PROPOFOL N/A 01/21/2018   Procedure: COLONOSCOPY WITH PROPOFOL;  Surgeon: Doran Stabler, MD;  Location: [facility];  Service: Gastroenterology;  Laterality: N/A;  . CYSTOSCOPY WITH HOLMIUM LASER LITHOTRIPSY Left 06/01/2015   Procedure: CYSTOSCOPY WITH HOLMIUM LASER LITHOTRIPSY;  Surgeon: Irine Seal, MD;  Location: [facility];  Service: Urology;  Laterality: Left;  . CYSTOSCOPY WITH URETEROSCOPY, STONE BASKETRY AND STENT PLACEMENT Left 05/18/2015   Procedure: CYSTOSCOPY WITH STENT PLACEMENT AND RETROGRADE PYELOGRAM;  Surgeon: Irine Seal, MD;  Location: [facility];  Service: Urology;  Laterality: Left;  . CYSTOSCOPY WITH URETEROSCOPY, STONE BASKETRY AND STENT PLACEMENT Left 06/01/2015   Procedure: CYSTOSCOPY WITH URETEROSCOPY, STONE BASKETRY AND STENT PLACEMENT;  Surgeon: Irine Seal, MD;  Location: [facility];  Service: Urology;  Laterality: Left;  . EXTRACORPOREAL SHOCK WAVE LITHOTRIPSY  2007  . TONSILLECTOMY  age 22-4    MEDICATIONS: . famotidine (PEPCID) 20 MG tablet  . Nintedanib (OFEV) 150 MG CAPS  . pantoprazole (PROTONIX) 40 MG tablet  . tamsulosin (FLOMAX) 0.4 MG CAPS capsule   No current facility-administered medications for this encounter.     Wynonia Musty [facility] Short Stay Center/Anesthesiology Phone [phone] 02/06/2018 10:14 AM

## 2018-02-07 ENCOUNTER — Ambulatory Visit: Payer: PPO | Admitting: Oncology

## 2018-02-07 ENCOUNTER — Encounter (HOSPITAL_COMMUNITY)
Admission: RE | Disposition: A | Discharge status: Home / Self Care | Payer: Self-pay | Source: Home / Self Care | Attending: Emergency Medicine

## 2018-02-07 ENCOUNTER — Ambulatory Visit (HOSPITAL_COMMUNITY): Payer: PPO | Admitting: Vascular Surgery

## 2018-02-07 ENCOUNTER — Ambulatory Visit (HOSPITAL_COMMUNITY): Payer: PPO | Admitting: Certified Registered Nurse Anesthetist

## 2018-02-07 ENCOUNTER — Ambulatory Visit (HOSPITAL_COMMUNITY): Payer: PPO

## 2018-02-07 ENCOUNTER — Ambulatory Visit (HOSPITAL_COMMUNITY)
Admission: RE | Admit: 2018-02-07 | Discharge: 2018-02-07 | Disposition: A | Discharge status: Home / Self Care | Payer: PPO | Attending: Emergency Medicine | Admitting: Emergency Medicine

## 2018-02-07 ENCOUNTER — Other Ambulatory Visit: Payer: Self-pay

## 2018-02-07 ENCOUNTER — Encounter (HOSPITAL_COMMUNITY): Payer: Self-pay

## 2018-02-07 DIAGNOSIS — L041 Acute lymphadenitis of trunk: Secondary | ICD-10-CM | POA: Diagnosis not present

## 2018-02-07 DIAGNOSIS — K219 Gastro-esophageal reflux disease without esophagitis: Secondary | ICD-10-CM | POA: Insufficient documentation

## 2018-02-07 DIAGNOSIS — R911 Solitary pulmonary nodule: Secondary | ICD-10-CM | POA: Diagnosis not present

## 2018-02-07 DIAGNOSIS — K449 Diaphragmatic hernia without obstruction or gangrene: Secondary | ICD-10-CM | POA: Diagnosis not present

## 2018-02-07 DIAGNOSIS — R590 Localized enlarged lymph nodes: Secondary | ICD-10-CM | POA: Insufficient documentation

## 2018-02-07 DIAGNOSIS — R079 Chest pain, unspecified: Secondary | ICD-10-CM | POA: Diagnosis not present

## 2018-02-07 DIAGNOSIS — Z79899 Other long term (current) drug therapy: Secondary | ICD-10-CM | POA: Diagnosis not present

## 2018-02-07 DIAGNOSIS — R918 Other nonspecific abnormal finding of lung field: Secondary | ICD-10-CM | POA: Diagnosis not present

## 2018-02-07 DIAGNOSIS — I2510 Atherosclerotic heart disease of native coronary artery without angina pectoris: Secondary | ICD-10-CM | POA: Diagnosis not present

## 2018-02-07 DIAGNOSIS — C3432 Malignant neoplasm of lower lobe, left bronchus or lung: Secondary | ICD-10-CM | POA: Diagnosis not present

## 2018-02-07 DIAGNOSIS — C20 Malignant neoplasm of rectum: Secondary | ICD-10-CM | POA: Insufficient documentation

## 2018-02-07 DIAGNOSIS — Z9889 Other specified postprocedural states: Secondary | ICD-10-CM

## 2018-02-07 DIAGNOSIS — Z419 Encounter for procedure for purposes other than remedying health state, unspecified: Secondary | ICD-10-CM

## 2018-02-07 DIAGNOSIS — J84112 Idiopathic pulmonary fibrosis: Secondary | ICD-10-CM | POA: Diagnosis not present

## 2018-02-07 DIAGNOSIS — C771 Secondary and unspecified malignant neoplasm of intrathoracic lymph nodes: Secondary | ICD-10-CM | POA: Diagnosis not present

## 2018-02-07 DIAGNOSIS — Z87442 Personal history of urinary calculi: Secondary | ICD-10-CM | POA: Insufficient documentation

## 2018-02-07 HISTORY — PX: VIDEO BRONCHOSCOPY WITH ENDOBRONCHIAL NAVIGATION: SHX6175

## 2018-02-07 HISTORY — PX: VIDEO BRONCHOSCOPY WITH ENDOBRONCHIAL ULTRASOUND: SHX6177

## 2018-02-07 SURGERY — BRONCHOSCOPY, WITH EBUS
Anesthesia: General | Laterality: Left

## 2018-02-07 MED ORDER — PROPOFOL 10 MG/ML IV BOLUS
INTRAVENOUS | Status: DC | PRN
  Administered 2018-02-07: 200 mg via INTRAVENOUS

## 2018-02-07 MED ORDER — FENTANYL CITRATE (PF) 100 MCG/2ML IJ SOLN
INTRAMUSCULAR | Status: DC | PRN
  Administered 2018-02-07: 100 ug via INTRAVENOUS

## 2018-02-07 MED ORDER — IPRATROPIUM-ALBUTEROL 0.5-2.5 (3) MG/3ML IN SOLN
RESPIRATORY_TRACT | Status: AC
  Filled 2018-02-07: qty 3

## 2018-02-07 MED ORDER — LIDOCAINE 2% (20 MG/ML) 5 ML SYRINGE
INTRAMUSCULAR | Status: AC
  Filled 2018-02-07: qty 5

## 2018-02-07 MED ORDER — LACTATED RINGERS IV SOLN
INTRAVENOUS | Status: DC | PRN
  Administered 2018-02-07 (×2): via INTRAVENOUS

## 2018-02-07 MED ORDER — ONDANSETRON HCL 4 MG/2ML IJ SOLN
INTRAMUSCULAR | Status: DC | PRN
  Administered 2018-02-07: 4 mg via INTRAVENOUS

## 2018-02-07 MED ORDER — IPRATROPIUM-ALBUTEROL 0.5-2.5 (3) MG/3ML IN SOLN
3.0000 mL | Freq: Four times a day (QID) | RESPIRATORY_TRACT | Status: DC
  Administered 2018-02-07: 3 mL via RESPIRATORY_TRACT

## 2018-02-07 MED ORDER — ROCURONIUM BROMIDE 10 MG/ML (PF) SYRINGE
PREFILLED_SYRINGE | INTRAVENOUS | Status: DC | PRN
  Administered 2018-02-07 (×2): 10 mg via INTRAVENOUS
  Administered 2018-02-07: 50 mg via INTRAVENOUS
  Administered 2018-02-07: 20 mg via INTRAVENOUS

## 2018-02-07 MED ORDER — SODIUM CHLORIDE 0.9 % IV SOLN
INTRAVENOUS | Status: DC | PRN
  Administered 2018-02-07: 50 ug/min via INTRAVENOUS

## 2018-02-07 MED ORDER — ROCURONIUM BROMIDE 50 MG/5ML IV SOSY
PREFILLED_SYRINGE | INTRAVENOUS | Status: AC
  Filled 2018-02-07: qty 25

## 2018-02-07 MED ORDER — PHENYLEPHRINE 40 MCG/ML (10ML) SYRINGE FOR IV PUSH (FOR BLOOD PRESSURE SUPPORT)
PREFILLED_SYRINGE | INTRAVENOUS | Status: DC | PRN
  Administered 2018-02-07: 120 ug via INTRAVENOUS

## 2018-02-07 MED ORDER — LACTATED RINGERS IV SOLN
INTRAVENOUS | Status: DC
  Administered 2018-02-07: 13:00:00 via INTRAVENOUS

## 2018-02-07 MED ORDER — 0.9 % SODIUM CHLORIDE (POUR BTL) OPTIME
TOPICAL | Status: DC | PRN
  Administered 2018-02-07: 1000 mL

## 2018-02-07 MED ORDER — EPINEPHRINE PF 1 MG/ML IJ SOLN
INTRAMUSCULAR | Status: AC
  Filled 2018-02-07: qty 1

## 2018-02-07 MED ORDER — FENTANYL CITRATE (PF) 250 MCG/5ML IJ SOLN
INTRAMUSCULAR | Status: AC
  Filled 2018-02-07: qty 5

## 2018-02-07 MED ORDER — LIDOCAINE 2% (20 MG/ML) 5 ML SYRINGE
INTRAMUSCULAR | Status: DC | PRN
  Administered 2018-02-07: 80 mg via INTRAVENOUS
  Administered 2018-02-07: 60 mg via INTRAVENOUS

## 2018-02-07 MED ORDER — SUGAMMADEX SODIUM 200 MG/2ML IV SOLN
INTRAVENOUS | Status: DC | PRN
  Administered 2018-02-07: 200 mg via INTRAVENOUS

## 2018-02-07 MED ORDER — DEXAMETHASONE SODIUM PHOSPHATE 10 MG/ML IJ SOLN
INTRAMUSCULAR | Status: DC | PRN
  Administered 2018-02-07: 10 mg via INTRAVENOUS

## 2018-02-07 SURGICAL SUPPLY — 54 items
ADAPTER BRONCHOSCOPE OLYMPUS (ADAPTER) ×3 IMPLANT
ADAPTER VALVE BIOPSY EBUS (MISCELLANEOUS) IMPLANT
ADPR BSCP OLMPS EDG (ADAPTER) ×1
ADPTR VALVE BIOPSY EBUS (MISCELLANEOUS)
BALLN FOR EBUS SCOPE (BALLOONS) ×6
BALLOON FOR EBUS SCOPE (BALLOONS) IMPLANT
BALN ESCP STRL LXBF-UC160F (BALLOONS) ×2
BRUSH SUPERTRAX BIOPSY (INSTRUMENTS) IMPLANT
BRUSH SUPERTRAX NDL-TIP CYTO (INSTRUMENTS) ×5 IMPLANT
CANISTER SUCT 3000ML PPV (MISCELLANEOUS) ×3 IMPLANT
CHANNEL WORK EXTEND EDGE 180 (KITS) IMPLANT
CHANNEL WORK EXTEND EDGE 90 (KITS) IMPLANT
CONT SPEC 4OZ CLIKSEAL STRL BL (MISCELLANEOUS) ×3 IMPLANT
COVER BACK TABLE 60X90IN (DRAPES) ×3 IMPLANT
COVER WAND RF STERILE (DRAPES) ×3 IMPLANT
Covidien superdimension cytology brush ×2 IMPLANT
FILTER STRAW FLUID ASPIR (MISCELLANEOUS) IMPLANT
FORCEPS BIOP RJ4 1.8 (CUTTING FORCEPS) IMPLANT
FORCEPS BIOP SUPERTRX PREMAR (INSTRUMENTS) ×3 IMPLANT
GAUZE SPONGE 4X4 12PLY STRL (GAUZE/BANDAGES/DRESSINGS) ×3 IMPLANT
GLOVE BIO SURGEON STRL SZ7.5 (GLOVE) ×6 IMPLANT
GOWN STRL REUS W/ TWL LRG LVL3 (GOWN DISPOSABLE) ×2 IMPLANT
GOWN STRL REUS W/TWL LRG LVL3 (GOWN DISPOSABLE) ×6
KIT CLEAN ENDO COMPLIANCE (KITS) ×6 IMPLANT
KIT LOCATABLE GUIDE (CANNULA) IMPLANT
KIT MARKER FIDUCIAL DELIVERY (KITS) IMPLANT
KIT PROCEDURE EDGE 180 (KITS) ×2 IMPLANT
KIT PROCEDURE EDGE 90 (KITS) IMPLANT
KIT TURNOVER KIT B (KITS) ×3 IMPLANT
MARKER SKIN DUAL TIP RULER LAB (MISCELLANEOUS) ×3 IMPLANT
NDL ASPIRATION VIZISHOT 19G (NEEDLE) IMPLANT
NDL ASPIRATION VIZISHOT 21G (NEEDLE) IMPLANT
NEEDLE ASPIRATION VIZISHOT 19G (NEEDLE) ×3 IMPLANT
NEEDLE ASPIRATION VIZISHOT 21G (NEEDLE) IMPLANT
NEEDLE SUPERTRX PREMARK BIOPSY (NEEDLE) ×3 IMPLANT
NS IRRIG 1000ML POUR BTL (IV SOLUTION) ×3 IMPLANT
OIL SILICONE PENTAX (PARTS (SERVICE/REPAIRS)) ×3 IMPLANT
PAD ARMBOARD 7.5X6 YLW CONV (MISCELLANEOUS) ×6 IMPLANT
PATCHES PATIENT (LABEL) ×9 IMPLANT
STOPCOCK 4 WAY LG BORE MALE ST (IV SETS) ×2 IMPLANT
SYR 10ML LL (SYRINGE) ×2 IMPLANT
SYR 20CC LL (SYRINGE) ×6 IMPLANT
SYR 20ML ECCENTRIC (SYRINGE) ×6 IMPLANT
SYR 50ML SLIP (SYRINGE) ×3 IMPLANT
SYR 5ML LUER SLIP (SYRINGE) ×3 IMPLANT
TOWEL OR 17X24 6PK STRL BLUE (TOWEL DISPOSABLE) ×3 IMPLANT
TRAP SPECIMEN MUCOUS 40CC (MISCELLANEOUS) IMPLANT
TUBE CONNECTING 20'X1/4 (TUBING) ×2
TUBE CONNECTING 20X1/4 (TUBING) ×4 IMPLANT
UNDERPAD 30X30 (UNDERPADS AND DIAPERS) ×3 IMPLANT
VALVE BIOPSY  SINGLE USE (MISCELLANEOUS) ×2
VALVE BIOPSY SINGLE USE (MISCELLANEOUS) ×1 IMPLANT
VALVE SUCTION BRONCHIO DISP (MISCELLANEOUS) ×3 IMPLANT
WATER STERILE IRR 1000ML POUR (IV SOLUTION) ×3 IMPLANT

## 2018-02-07 NOTE — Discharge Instructions (Signed)
Flexible Bronchoscopy, Care After This sheet gives you information about how to care for yourself after your test. Your doctor may also give you more specific instructions. If you have problems or questions, contact your doctor. Follow these instructions at home: Eating and drinking  Do not eat or drink anything (not even water) for 2 hours after your test, or until your numbing medicine (local anesthetic) wears off.  When your numbness is gone and your cough and gag reflexes have come back, you may: ? Eat only soft foods. ? Slowly drink liquids.  The day after the test, go back to your normal diet. Driving  Do not drive for 24 hours if you were given a medicine to help you relax (sedative).  Do not drive or use heavy machinery while taking prescription pain medicine. General instructions   Take over-the-counter and prescription medicines only as told by your doctor.  Return to your normal activities as told. Ask what activities are safe for you.  Do not use any products that have nicotine or tobacco in them. This includes cigarettes and e-cigarettes. If you need help quitting, ask your doctor.  Keep all follow-up visits as told by your doctor. This is important. It is very important if you had a tissue sample (biopsy) taken. Get help right away if:  You have shortness of breath that gets worse.  You get light-headed.  You feel like you are going to pass out (faint).  You have chest pain.  You cough up: ? More than a little blood. ? More blood than before. Summary  Do not eat or drink anything (not even water) for 2 hours after your test, or until your numbing medicine wears off.  Do not use cigarettes. Do not use e-cigarettes.  Get help right away if you have chest pain.  Please call our office for any questions or concerns. [phone].  This information is not intended to replace advice given to you by your health care provider. Make sure you discuss any  questions you have with your health care provider. Document Released: 10/23/2008 Document Revised: 01/14/2016 Document Reviewed: 01/14/2016 Elsevier Interactive Patient Education  2019 [employer].

## 2018-02-07 NOTE — Op Note (Signed)
Video Bronchoscopy with Electromagnetic Navigation and Endobronchial Ultrasound Procedure Note  Date of Operation: 02/07/2018  Pre-op Diagnosis: History of rectal cancer, left mediastinal lymphadenopathy, left lower lobe nodule  Post-op Diagnosis: Same  Surgeon: Baltazar Apo  Assistants: None  Anesthesia: General endotracheal anesthesia  Operation: Flexible video fiberoptic bronchoscopy with electromagnetic navigation and biopsies.  Estimated Blood Loss: Minimal  Complications: None apparent  Indications and History: Austin Yu is a 73 y.o. male with history of ILD and newly diagnosed rectal cancer.  As part of his staging evaluation he was found to have left mediastinal and hilar lymphadenopathy and a hypermetabolic left lower lobe pulmonary nodule.  Recommendation was made to achieve a tissue diagnosis via navigational bronchoscopy with endobronchial ultrasound, biopsies.  The risks, benefits, complications, treatment options and expected outcomes were discussed with the patient.  The possibilities of pneumothorax, pneumonia, reaction to medication, pulmonary aspiration, perforation of a viscus, bleeding, failure to diagnose a condition and creating a complication requiring transfusion or operation were discussed with the patient who freely signed the consent.    Description of Procedure: The patient was seen in the Preoperative Area, was examined and was deemed appropriate to proceed.  The patient was taken to OR 10, identified as Austin Yu and the procedure verified as Flexible Video Fiberoptic Bronchoscopy.  A Time Out was held and the above information confirmed.   Prior to the date of the procedure a high-resolution CT scan of the chest was performed. Utilizing Reardan a virtual tracheobronchial tree was generated to allow the creation of distinct navigation pathways to the patient's left lower lobe abnormalities. After being taken to the operating room  general anesthesia was initiated and the patient  was orally intubated. The video fiberoptic bronchoscope was introduced via the endotracheal tube and a general inspection was performed which showed normal airways throughout with no endobronchial lesions or abnormal secretions seen. The extendable working channel and locator guide were introduced into the bronchoscope. The distinct navigation pathway prepared prior to this procedure were then utilized to navigate to within 0.6 cm of patient's lesion identified on CT scan. The extendable working channel was secured into place and the locator guide was withdrawn. Under fluoroscopic guidance transbronchial brushings were performed to be sent for cytology.   The standard scope was then withdrawn and the endobronchial ultrasound was used to identify and characterize the peritracheal, hilar and bronchial lymph nodes. Inspection showed significant enlargement of the station 7, station 10 L, station 11 L nodes. Using real-time ultrasound guidance Wang needle biopsies were take from Station 7 and 11 L nodes and were sent for cytology.   At the end of the procedure a general airway inspection was performed and there was no evidence of active bleeding. The bronchoscope was removed.  The patient tolerated the procedure well. There was no significant blood loss and there were no obvious complications. A post-procedural chest x-ray is pending.  Samples: 1. Transbronchial brushings from left lower lobe nodule 2. Wang needle biopsies from station 7 node 3. Wang needle biopsies from station 11 L node    Plans:  The patient will be discharged from the PACU to home when recovered from anesthesia and CXR is reviewed. We will review the cytology, pathology results with the patient when they become available. Outpatient followup will be with Dr Benay Spice and Dr Lamonte Sakai.    Baltazar Apo, MD, PhD 02/07/2018, 4:41 PM [facility] [phone] or if no  answer [phone]

## 2018-02-07 NOTE — Anesthesia Procedure Notes (Signed)
Procedure Name: Intubation Date/Time: 02/07/2018 2:58 PM Performed by: Leonor Liv, CRNA Pre-anesthesia Checklist: Patient identified, Emergency Drugs available, Suction available and Patient being monitored Patient Re-evaluated:Patient Re-evaluated prior to induction Oxygen Delivery Method: Circle System Utilized Preoxygenation: Pre-oxygenation with 100% oxygen Induction Type: IV induction Ventilation: Mask ventilation without difficulty and Oral airway inserted - appropriate to patient size Laryngoscope Size: Mac and 4 Grade View: Grade I Tube type: Oral Tube size: 8.5 mm Number of attempts: 1 Airway Equipment and Method: Stylet Placement Confirmation: ETT inserted through vocal cords under direct vision,  positive ETCO2 and breath sounds checked- equal and bilateral Secured at: 21 cm Tube secured with: Tape Dental Injury: Teeth and Oropharynx as per pre-operative assessment

## 2018-02-07 NOTE — Anesthesia Postprocedure Evaluation (Signed)
Anesthesia Post Note  Patient: WINSON EICHORN  Procedure(s) Performed: VIDEO BRONCHOSCOPY WITH ENDOBRONCHIAL ULTRASOUND (Left ) VIDEO BRONCHOSCOPY WITH ENDOBRONCHIAL NAVIGATION (Left )     Patient location during evaluation: PACU Anesthesia Type: General Level of consciousness: awake and alert Pain management: pain level controlled Vital Signs Assessment: post-procedure vital signs reviewed and stable Respiratory status: spontaneous breathing, nonlabored ventilation and respiratory function stable Cardiovascular status: blood pressure returned to baseline and stable Postop Assessment: no apparent nausea or vomiting Anesthetic complications: no    Last Vitals:  Vitals:   02/07/18 1700 02/07/18 1715  BP: 126/79 120/77  Pulse: (!) 102 94  Resp: 20 (!) 22  Temp:    SpO2: 96% 93%    Last Pain:  Vitals:   02/07/18 1715  TempSrc:   PainSc: 0-No pain                 Danner Paulding,W. EDMOND

## 2018-02-07 NOTE — Interval H&P Note (Signed)
PCCM Interval Note  73 year old man with a history of interstitial lung disease, formally on OFEV, has a new diagnosis of rectal cancer.  His staging evaluation has revealed left mediastinal lymphadenopathy as well as a left basilar rounded opacity in the area of some of his existing interstitial lung disease.  He presents now for biopsy of both areas.  No new issues reported.  He feels well, no dyspnea on room air.  Vitals:   02/07/18 1310  BP: 138/88  Pulse: 92  Resp: 18  Temp: 98.2 F (36.8 C)  TempSrc: Oral  SpO2: 93%  Weight: 86 kg  Height: 5\' 7"  (1.702 m)  Well-appearing, no distress.  Lungs significant for bibasilar inspiratory crackles, no wheezing.  Heart regular without a murmur.  Abdomen soft, benign.  He has no lower extremity edema.  CBC Latest Ref Rng & Units 02/05/2018 01/24/2018 08/25/2016  WBC 4.0 - 10.5 K/uL 7.1 8.3 7.9  Hemoglobin 13.0 - 17.0 g/dL 16.8 16.7 16.0  Hematocrit 39.0 - 52.0 % 53.1(H) 49.3 48.5  Platelets 150 - 400 K/uL 242 252.0 226.0   BMP Latest Ref Rng & Units 02/05/2018 01/28/2018 01/24/2018  Glucose 70 - 99 mg/dL 138(H) - 97  BUN 8 - 23 mg/dL 14 - 16  Creatinine 0.61 - 1.24 mg/dL 1.26(H) 1.30(H) 1.31  Sodium 135 - 145 mmol/L 140 - 140  Potassium 3.5 - 5.1 mmol/L 4.0 - 3.5  Chloride 98 - 111 mmol/L 106 - 104  CO2 22 - 32 mmol/L 25 - 25  Calcium 8.9 - 10.3 mg/dL 9.4 - 9.4   INR 1.05  Impression/plan:  Plan for bronchoscopy with endobronchial ultrasound, navigation to sample left mediastinal/hilar nodes, left basilar opacity.  All questions answered.  Risk and benefits discussed.  No barriers identified.  Baltazar Apo, MD, PhD 02/07/2018, 2:19 PM  [facility] [phone] or if no answer [phone]

## 2018-02-07 NOTE — Transfer of Care (Signed)
Immediate Anesthesia Transfer of Care Note  Patient: Austin Yu  Procedure(s) Performed: VIDEO BRONCHOSCOPY WITH ENDOBRONCHIAL ULTRASOUND (Left ) VIDEO BRONCHOSCOPY WITH ENDOBRONCHIAL NAVIGATION (Left )  Patient Location: PACU  Anesthesia Type:General  Level of Consciousness: awake, alert  and oriented  Airway & Oxygen Therapy: Patient Spontanous Breathing and Patient connected to face mask oxygen  Post-op Assessment: Report given to RN and Post -op Vital signs reviewed and stable  Post vital signs: Reviewed and stable  Last Vitals:  Vitals Value Taken Time  BP 104/84 02/07/2018  4:46 PM  Temp    Pulse 101 02/07/2018  4:49 PM  Resp 25 02/07/2018  4:49 PM  SpO2 97 % 02/07/2018  4:49 PM  Vitals shown include unvalidated device data.  Last Pain:  Vitals:   02/07/18 1310  TempSrc: Oral  PainSc: 0-No pain      Patients Stated Pain Goal: 2 ([id number])  Complications: No apparent anesthesia complications

## 2018-02-08 ENCOUNTER — Telehealth: Payer: Self-pay | Admitting: Oncology

## 2018-02-08 ENCOUNTER — Encounter (HOSPITAL_COMMUNITY): Payer: Self-pay | Admitting: Emergency Medicine

## 2018-02-08 NOTE — Telephone Encounter (Signed)
Called patient per 1/31 sch message - unable to reach patient - left message with appt date and time

## 2018-02-11 ENCOUNTER — Telehealth: Payer: Self-pay | Admitting: Radiation Oncology

## 2018-02-11 NOTE — Telephone Encounter (Signed)
I called and spoke with the patient to let him know path is still pending but hope to have more information tomorrow. He will see Dr. Benay Spice tomorrow afternoon. He has not heard from nutrition. I'll reach out to see if he can be seen tomorrow while he's there.

## 2018-02-12 ENCOUNTER — Encounter: Payer: Self-pay | Admitting: Nurse Practitioner

## 2018-02-12 ENCOUNTER — Telehealth: Payer: Self-pay

## 2018-02-12 ENCOUNTER — Inpatient Hospital Stay: Payer: PPO | Attending: Nurse Practitioner | Admitting: Nurse Practitioner

## 2018-02-12 ENCOUNTER — Telehealth: Payer: Self-pay | Admitting: Nurse Practitioner

## 2018-02-12 VITALS — BP 134/85 | HR 100 | Temp 98.0°F | Resp 18 | Ht 67.0 in | Wt 189.6 lb

## 2018-02-12 DIAGNOSIS — C20 Malignant neoplasm of rectum: Secondary | ICD-10-CM | POA: Diagnosis not present

## 2018-02-12 DIAGNOSIS — Z5111 Encounter for antineoplastic chemotherapy: Secondary | ICD-10-CM | POA: Diagnosis not present

## 2018-02-12 DIAGNOSIS — N400 Enlarged prostate without lower urinary tract symptoms: Secondary | ICD-10-CM | POA: Diagnosis not present

## 2018-02-12 DIAGNOSIS — R590 Localized enlarged lymph nodes: Secondary | ICD-10-CM | POA: Insufficient documentation

## 2018-02-12 DIAGNOSIS — J8410 Pulmonary fibrosis, unspecified: Secondary | ICD-10-CM | POA: Insufficient documentation

## 2018-02-12 DIAGNOSIS — R918 Other nonspecific abnormal finding of lung field: Secondary | ICD-10-CM | POA: Diagnosis not present

## 2018-02-12 DIAGNOSIS — K219 Gastro-esophageal reflux disease without esophagitis: Secondary | ICD-10-CM | POA: Insufficient documentation

## 2018-02-12 DIAGNOSIS — Z7189 Other specified counseling: Secondary | ICD-10-CM | POA: Insufficient documentation

## 2018-02-12 DIAGNOSIS — D6959 Other secondary thrombocytopenia: Secondary | ICD-10-CM | POA: Insufficient documentation

## 2018-02-12 DIAGNOSIS — N200 Calculus of kidney: Secondary | ICD-10-CM | POA: Insufficient documentation

## 2018-02-12 MED ORDER — LIDOCAINE-PRILOCAINE 2.5-2.5 % EX CREA: TOPICAL_CREAM | CUTANEOUS | 2 refills | Status: DC

## 2018-02-12 MED ORDER — PROCHLORPERAZINE MALEATE 10 MG PO TABS: 10.0000 mg | ORAL_TABLET | Freq: Four times a day (QID) | ORAL | 2 refills | Status: DC | PRN

## 2018-02-12 NOTE — Telephone Encounter (Signed)
Scheduled appt per 02/04 los.  Printed calendar and avs.

## 2018-02-12 NOTE — Progress Notes (Signed)
START ON PATHWAY REGIMEN - Colorectal     A cycle is every 14 days:     Oxaliplatin      Leucovorin      5-Fluorouracil      5-Fluorouracil      Bevacizumab-xxxx   **Always confirm dose/schedule in your pharmacy ordering system**  Patient Characteristics: Distant Metastases, First Line, Nonsurgical Candidate, KRAS Mutation Positive/Unknown, BRAF Wild-Type/Unknown, PS = 0,1; Bevacizumab Eligible Therapeutic Status: Distant Metastases BRAF Mutation Status: Awaiting Test Results KRAS/NRAS Mutation Status: Awaiting Test Results Line of Therapy: First Line Performance Status: PS = 0, 1 Bevacizumab Eligibility: Eligible Intent of Therapy: Non-Curative / Palliative Intent, Discussed with Patient 

## 2018-02-12 NOTE — Telephone Encounter (Signed)
TC per lisa to pathology ([id number]) to request MSI and foundation 1 testing on rectal biopsy 01/21/18. Request complete.

## 2018-02-12 NOTE — Progress Notes (Addendum)
[facility] OFFICE PROGRESS NOTE   Diagnosis:  Rectal cancer  INTERVAL HISTORY:   Austin Yu returns as scheduled.  He has stable dyspnea which he thinks is related to pulmonary fibrosis.  Periodic cough.  No hemoptysis.  No pain or bleeding with bowel movements.  Objective:  Vital signs in last 24 hours:  Blood pressure 134/85, pulse 100, temperature 98 F (36.7 C), temperature source Oral, resp. rate 18, height 5\' 7"  (1.702 m), weight 189 lb 9.6 oz (86 kg), SpO2 95 %.    Resp: Lungs clear bilaterally. Cardio: Regular, mild tachycardia. GI: No hepatomegaly. Vascular: No leg edema.   Lab Results:  Lab Results  Component Value Date   WBC 7.1 02/05/2018   HGB 16.8 02/05/2018   HCT 53.1 (H) 02/05/2018   MCV 92.7 02/05/2018   PLT 242 02/05/2018   NEUTROABS 4.7 01/24/2018    Imaging:  No results found.  Medications: I have reviewed the patient's current medications.  Assessment/Plan: 1. Rectal cancer ? Distal rectal mass noted on colonoscopy 01/21/2018, biopsy confirmed invasive adenocarcinoma ? CTs 01/29/2018- new left infrahilar mass, enlarged left hilar lymph node, mild subcarinal lymphadenopathy, confluent ill-defined lesion in area fibrosis at the posterior left lower lobe measuring 2 x 1.5 cm, rectal mass, perirectal lymph nodes ? MRI 01/28/2018-T3bN1 tumor located at 6.6 cm from the internal anal sphincter, 2 mesorectal lymph nodes ? Hypermetabolic left infrahilar mass.  Hypermetabolic nodule left lower lobe.  Focal hypermetabolic activity within the rectum. ? Bronchoscopy 02/07/2018- no endobronchial lesions or abnormal secretions seen.  Transbronchial brushings obtained from the left lower lobe nodule.  Significant enlargement of the station 7, station 10 L, station 11 L nodes.  Needle biopsies were taken from station 7 and 11 L nodes. 2. Pulmonary fibrosis 3. Kidney stones 4. BPH 5. History of gastroesophageal reflux  Disposition: Austin Yu  appears to have metastatic rectal cancer involving lung. The preliminary report from the recent bronchoscopy indicates adenocarcinoma.  Special stains are pending.  Dr. Benay Spice reviewed these findings with Austin Yu and his family.  He appears asymptomatic from the cancer.  The radiation simulation scheduled for tomorrow will be canceled.  Dr. Benay Spice recommends initiating systemic therapy with FOLFOX plus Avastin.  We reviewed potential toxicities associated with chemotherapy including bone marrow toxicity, nausea, hair loss, allergic reaction.  We discussed potential toxicities associated with 5-fluorouracil including mouth sores, diarrhea, hand-foot syndrome, skin hyperpigmentation, rash, increased sensitivity to sun.  We reviewed the neurotoxicity associated with oxaliplatin including cold sensitivity, acute laryngopharyngeal dysesthesia, peripheral neuropathy and more rare occurrences such as double vision and ataxia.  We discussed potential toxicities associated with Avastin including allergic reaction, hypertension, bleeding, increased risk of blood clots and strokes, bowel perforation.  He agrees to proceed.  He will attend a chemotherapy education class.  He understands this regimen requires a Port-A-Cath.  We made a referral to interventional radiology.  He will return for a follow-up visit and cycle 1 FOLFOX 02/21/2018.  He will contact the office in the interim with any problems.  Patient seen with Dr. Benay Spice.  45 minutes were spent face-to-face at today's visit with the majority of that time involved in counseling/coordination of care.    [name] ANP/GNP-BC   02/12/2018  3:21 PM  This was a shared visit with [name].  Austin Yu underwent a bronchoscopy 02/07/2018.  The preliminary pathology is most consistent with metastatic rectal cancer.  This includes biopsies from chest lymph nodes and a brushing from  the left lower lobe nodule.  Additional immunohistochemical stains to help  confirm a rectal primary are pending.  I discussed the diagnosis and treatment options with Austin Yu and his family.  He remains asymptomatic from the metastatic carcinoma.  I discussed the case with Dr. Lisbeth Renshaw.  It appears very likely that he has metastatic rectal cancer.  The chance of undergoing curative therapy is very small.  I recommend systemic therapy.  This can be followed by restaging evaluation and consideration of radiation if there was no evidence of disease progression.  I recommend FOLFOX/Avastin.  We reviewed potential toxicities associated with the FOLFOX regimen and Avastin.  He will attend a chemotherapy teaching class.  He agrees to proceed.  We will plan for a restaging evaluation after 5 cycles of FOLFOX/Avastin.  A chemotherapy plan was entered today.  Julieanne Manson, MD

## 2018-02-13 ENCOUNTER — Inpatient Hospital Stay: Payer: PPO

## 2018-02-13 ENCOUNTER — Ambulatory Visit: Payer: PPO | Admitting: Radiation Oncology

## 2018-02-13 ENCOUNTER — Inpatient Hospital Stay: Payer: PPO | Admitting: Nutrition

## 2018-02-13 ENCOUNTER — Other Ambulatory Visit (HOSPITAL_COMMUNITY)
Admission: RE | Admit: 2018-02-13 | Discharge: 2018-02-13 | Disposition: A | Discharge status: Home / Self Care | Payer: PPO | Source: Ambulatory Visit | Attending: Oncology | Admitting: Oncology

## 2018-02-13 DIAGNOSIS — Z5111 Encounter for antineoplastic chemotherapy: Secondary | ICD-10-CM | POA: Diagnosis not present

## 2018-02-13 DIAGNOSIS — C20 Malignant neoplasm of rectum: Secondary | ICD-10-CM | POA: Insufficient documentation

## 2018-02-13 LAB — CMP (CANCER CENTER ONLY)
ALBUMIN: 3.6 g/dL (ref 3.5–5.0)
ALT: 17 U/L (ref 0–44)
ANION GAP: 8 (ref 5–15)
AST: 17 U/L (ref 15–41)
Alkaline Phosphatase: 58 U/L (ref 38–126)
BUN: 19 mg/dL (ref 8–23)
CO2: 28 mmol/L (ref 22–32)
Calcium: 9.8 mg/dL (ref 8.9–10.3)
Chloride: 105 mmol/L (ref 98–111)
Creatinine: 1.33 mg/dL — ABNORMAL HIGH (ref 0.61–1.24)
GFR, Estimated: 53 mL/min — ABNORMAL LOW (ref >60)
Glucose, Bld: 105 mg/dL — ABNORMAL HIGH (ref 70–99)
Potassium: 4.6 mmol/L (ref 3.5–5.1)
Sodium: 141 mmol/L (ref 135–145)
Total Bilirubin: 0.4 mg/dL (ref 0.3–1.2)
Total Protein: 8 g/dL (ref 6.5–8.1)

## 2018-02-13 LAB — CBC WITH DIFFERENTIAL (CANCER CENTER ONLY)
Abs Immature Granulocytes: 0.02 10*3/uL (ref 0.00–0.07)
Basophils Absolute: 0.1 10*3/uL (ref 0.0–0.1)
Basophils Relative: 1 %
EOS ABS: 0.4 10*3/uL (ref 0.0–0.5)
Eosinophils Relative: 4 %
HCT: 51.8 % (ref 39.0–52.0)
Hemoglobin: 16.7 g/dL (ref 13.0–17.0)
Immature Granulocytes: 0 %
Lymphocytes Relative: 26 %
Lymphs Abs: 2.6 10*3/uL (ref 0.7–4.0)
MCH: 29.8 pg (ref 26.0–34.0)
MCHC: 32.2 g/dL (ref 30.0–36.0)
MCV: 92.5 fL (ref 80.0–100.0)
Monocytes Absolute: 0.7 10*3/uL (ref 0.1–1.0)
Monocytes Relative: 7 %
NEUTROS PCT: 62 %
NRBC: 0 % (ref 0.0–0.2)
Neutro Abs: 6 10*3/uL (ref 1.7–7.7)
Platelet Count: 275 10*3/uL (ref 150–400)
RBC: 5.6 MIL/uL (ref 4.22–5.81)
RDW: 13.2 % (ref 11.5–15.5)
WBC Count: 9.8 10*3/uL (ref 4.0–10.5)

## 2018-02-13 LAB — CEA (IN HOUSE-CHCC): CEA (CHCC-In House): 21.75 ng/mL — ABNORMAL HIGH (ref 0.00–5.00)

## 2018-02-13 NOTE — Progress Notes (Signed)
74 year old male diagnosed with metastatic rectal cancer.  Past medical history includes pulmonary fibrosis, hyperlipidemia, kidney stones, GERD.  Medications include Pepcid, Protonix, and Compazine.  Labs include glucose 138 and creatinine 1.26 on January 28.  Height: 5 feet 7 inches. Weight: 189.6 pounds. BMI: 29.7.  Patient will be scheduled for FOLFOX every 2 weeks beginning February 13. He reports he will not have radiation therapy. Patient denies weight loss or any change in appetite.  Nutrition diagnosis: Food and nutrition related knowledge deficit related to new diagnosis of rectal cancer as evidenced by no prior need for nutrition related information.  Intervention: Patient was educated to consume smaller more frequent meals and snacks with adequate calories and protein to minimize weight loss. Briefly reviewed education concerning oxaliplatin and oral intake. Fact sheets were provided.  Questions were answered.  Contact information given.  Monitoring, evaluation, goals: Patient will tolerate adequate calories and protein throughout treatment to minimize weight loss.  Next visit: I will schedule with infusion.  **Disclaimer: This note was dictated with voice recognition software. Similar sounding words can inadvertently be transcribed and this note may contain transcription errors which may not have been corrected upon publication of note.**

## 2018-02-14 ENCOUNTER — Other Ambulatory Visit: Payer: Self-pay | Admitting: Nurse Practitioner

## 2018-02-14 ENCOUNTER — Encounter (HOSPITAL_COMMUNITY): Payer: Self-pay

## 2018-02-14 ENCOUNTER — Ambulatory Visit (HOSPITAL_COMMUNITY)
Admission: RE | Admit: 2018-02-14 | Discharge: 2018-02-14 | Disposition: A | Discharge status: Home / Self Care | Payer: PPO | Source: Ambulatory Visit | Attending: Oncology | Admitting: Oncology

## 2018-02-14 ENCOUNTER — Ambulatory Visit (HOSPITAL_COMMUNITY)
Admission: RE | Admit: 2018-02-14 | Discharge: 2018-02-14 | Disposition: A | Discharge status: Home / Self Care | Payer: PPO | Source: Ambulatory Visit | Attending: Nurse Practitioner | Admitting: Nurse Practitioner

## 2018-02-14 ENCOUNTER — Telehealth: Payer: Self-pay | Admitting: Oncology

## 2018-02-14 ENCOUNTER — Other Ambulatory Visit: Payer: Self-pay

## 2018-02-14 DIAGNOSIS — E785 Hyperlipidemia, unspecified: Secondary | ICD-10-CM | POA: Diagnosis not present

## 2018-02-14 DIAGNOSIS — Z79899 Other long term (current) drug therapy: Secondary | ICD-10-CM | POA: Insufficient documentation

## 2018-02-14 DIAGNOSIS — C20 Malignant neoplasm of rectum: Secondary | ICD-10-CM | POA: Diagnosis not present

## 2018-02-14 DIAGNOSIS — N200 Calculus of kidney: Secondary | ICD-10-CM | POA: Insufficient documentation

## 2018-02-14 DIAGNOSIS — Z452 Encounter for adjustment and management of vascular access device: Secondary | ICD-10-CM | POA: Diagnosis not present

## 2018-02-14 DIAGNOSIS — J8410 Pulmonary fibrosis, unspecified: Secondary | ICD-10-CM | POA: Diagnosis not present

## 2018-02-14 DIAGNOSIS — N400 Enlarged prostate without lower urinary tract symptoms: Secondary | ICD-10-CM | POA: Insufficient documentation

## 2018-02-14 DIAGNOSIS — Z5111 Encounter for antineoplastic chemotherapy: Secondary | ICD-10-CM | POA: Diagnosis not present

## 2018-02-14 DIAGNOSIS — K219 Gastro-esophageal reflux disease without esophagitis: Secondary | ICD-10-CM | POA: Diagnosis not present

## 2018-02-14 HISTORY — PX: IR IMAGING GUIDED PORT INSERTION: IMG5740

## 2018-02-14 MED ORDER — CEFAZOLIN SODIUM-DEXTROSE 2-4 GM/100ML-% IV SOLN
2.0000 g | Freq: Once | INTRAVENOUS | Status: AC
  Administered 2018-02-14: 2 g via INTRAVENOUS

## 2018-02-14 MED ORDER — HEPARIN SOD (PORK) LOCK FLUSH 100 UNIT/ML IV SOLN
INTRAVENOUS | Status: AC | PRN
  Administered 2018-02-14: 500 [IU] via INTRAVENOUS

## 2018-02-14 MED ORDER — LIDOCAINE-EPINEPHRINE (PF) 1 %-1:200000 IJ SOLN
INTRAMUSCULAR | Status: AC | PRN
  Administered 2018-02-14: 10 mL
  Administered 2018-02-14: 5 mL

## 2018-02-14 MED ORDER — SODIUM CHLORIDE 0.9 % IV SOLN
INTRAVENOUS | Status: DC
  Administered 2018-02-14: 09:00:00 via INTRAVENOUS

## 2018-02-14 MED ORDER — MIDAZOLAM HCL 2 MG/2ML IJ SOLN
INTRAMUSCULAR | Status: AC
  Filled 2018-02-14: qty 4

## 2018-02-14 MED ORDER — MIDAZOLAM HCL 2 MG/2ML IJ SOLN
INTRAMUSCULAR | Status: AC | PRN
  Administered 2018-02-14 (×2): 1 mg via INTRAVENOUS

## 2018-02-14 MED ORDER — FENTANYL CITRATE (PF) 100 MCG/2ML IJ SOLN
INTRAMUSCULAR | Status: AC | PRN
  Administered 2018-02-14 (×2): 50 ug via INTRAVENOUS

## 2018-02-14 MED ORDER — LIDOCAINE-EPINEPHRINE (PF) 2 %-1:200000 IJ SOLN
INTRAMUSCULAR | Status: AC
  Filled 2018-02-14: qty 20

## 2018-02-14 MED ORDER — HEPARIN SOD (PORK) LOCK FLUSH 100 UNIT/ML IV SOLN
INTRAVENOUS | Status: AC
  Filled 2018-02-14: qty 5

## 2018-02-14 MED ORDER — CEFAZOLIN SODIUM-DEXTROSE 2-4 GM/100ML-% IV SOLN
INTRAVENOUS | Status: AC
  Administered 2018-02-14: 2 g via INTRAVENOUS
  Filled 2018-02-14: qty 100

## 2018-02-14 MED ORDER — FENTANYL CITRATE (PF) 100 MCG/2ML IJ SOLN
INTRAMUSCULAR | Status: AC
  Filled 2018-02-14: qty 2

## 2018-02-14 NOTE — Telephone Encounter (Signed)
Called patient to inform the patient that their treatment has been added.  Patient aware of appt date and time.

## 2018-02-14 NOTE — Discharge Instructions (Signed)
You may shower and remove your dressing tomorrow.  DO NOT use EMLA cream for 2 weeks after port placement as this cream will remove surgical glue on your incision.  Implanted [facility] Guide An implanted port is a device that is placed under the skin. It is usually placed in the chest. The device can be used to give IV medicine, to take blood, or for dialysis. You may have an implanted port if:  You need IV medicine that would be irritating to the small veins in your hands or arms.  You need IV medicines, such as antibiotics, for a long period of time.  You need IV nutrition for a long period of time.  You need dialysis. Having a port means that your health care provider will not need to use the veins in your arms for these procedures. You may have fewer limitations when using a port than you would if you used other types of long-term IVs, and you will likely be able to return to normal activities after your incision heals. An implanted port has two main parts:  Reservoir. The reservoir is the part where a needle is inserted to give medicines or draw blood. The reservoir is round. After it is placed, it appears as a small, raised area under your skin.  Catheter. The catheter is a thin, flexible tube that connects the reservoir to a vein. Medicine that is inserted into the reservoir goes into the catheter and then into the vein. How is my port accessed? To access your port:  A numbing cream may be placed on the skin over the port site.  Your health care provider will put on a mask and sterile gloves.  The skin over your port will be cleaned carefully with a germ-killing soap and allowed to dry.  Your health care provider will gently pinch the port and insert a needle into it.  Your health care provider will check for a blood return to make sure the port is in the vein and is not clogged.  If your port needs to remain accessed to get medicine continuously (constant infusion), your  health care provider will place a clear bandage (dressing) over the needle site. The dressing and needle will need to be changed every week, or as told by your health care provider. What is flushing? Flushing helps keep the port from getting clogged. Follow instructions from your health care provider about how and when to flush the port. Ports are usually flushed with saline solution or a medicine called heparin. The need for flushing will depend on how the port is used:  If the port is only used from time to time to give medicines or draw blood, the port may need to be flushed: ? Before and after medicines have been given. ? Before and after blood has been drawn. ? As part of routine maintenance. Flushing may be recommended every 4-6 weeks.  If a constant infusion is running, the port may not need to be flushed.  Throw away any syringes in a disposal container that is meant for sharp items (sharps container). You can buy a sharps container from a pharmacy, or you can make one by using an empty hard plastic bottle with a cover. How long will my port stay implanted? The port can stay in for as long as your health care provider thinks it is needed. When it is time for the port to come out, a surgery will be done to remove it. The surgery  will be similar to the procedure that was done to put the port in. Follow these instructions at home:   Flush your port as told by your health care provider.  If you need an infusion over several days, follow instructions from your health care provider about how to take care of your port site. Make sure you: ? Wash your hands with soap and water before you change your dressing. If soap and water are not available, use alcohol-based hand sanitizer. ? Change your dressing as told by your health care provider. ? Place any used dressings or infusion bags into a plastic bag. Throw that bag in the trash. ? Keep the dressing that covers the needle clean and dry. Do not  get it wet. ? Do not use scissors or sharp objects near the tube. ? Keep the tube clamped, unless it is being used.  Check your port site every day for signs of infection. Check for: ? Redness, swelling, or pain. ? Fluid or blood. ? Pus or a bad smell.  Protect the skin around the port site. ? Avoid wearing bra straps that rub or irritate the site. ? Protect the skin around your port from seat belts. Place a soft pad over your chest if needed.  Bathe or shower as told by your health care provider. The site may get wet as long as you are not actively receiving an infusion.  Return to your normal activities as told by your health care provider. Ask your health care provider what activities are safe for you.  Carry a medical alert card or wear a medical alert bracelet at all times. This will let health care providers know that you have an implanted port in case of an emergency. Get help right away if:  You have redness, swelling, or pain at the port site.  You have fluid or blood coming from your port site.  You have pus or a bad smell coming from the port site.  You have a fever. Summary  Implanted ports are usually placed in the chest for long-term IV access.  Follow instructions from your health care provider about flushing the port and changing bandages (dressings).  Take care of the area around your port by avoiding clothing that puts pressure on the area, and by watching for signs of infection.  Protect the skin around your port from seat belts. Place a soft pad over your chest if needed.  Get help right away if you have a fever or you have redness, swelling, pain, drainage, or a bad smell at the port site. This information is not intended to replace advice given to you by your health care provider. Make sure you discuss any questions you have with your health care provider. Document Released: 12/26/2004 Document Revised: 01/29/2016 Document Reviewed: 01/29/2016 Elsevier  Interactive Patient Education  2019 [employer].   Moderate Conscious Sedation, Adult, Care After These instructions provide you with information about caring for yourself after your procedure. Your health care provider may also give you more specific instructions. Your treatment has been planned according to current medical practices, but problems sometimes occur. Call your health care provider if you have any problems or questions after your procedure. What can I expect after the procedure? After your procedure, it is common:  To feel sleepy for several hours.  To feel clumsy and have poor balance for several hours.  To have poor judgment for several hours.  To vomit if you eat too soon. Follow these  instructions at home: For at least 24 hours after the procedure:   Do not: ? Participate in activities where you could fall or become injured. ? Drive. ? Use heavy machinery. ? Drink alcohol. ? Take sleeping pills or medicines that cause drowsiness. ? Make important decisions or sign legal documents. ? Take care of children on your own.  Rest. Eating and drinking  Follow the diet recommended by your health care provider.  If you vomit: ? Drink water, juice, or soup when you can drink without vomiting. ? Make sure you have little or no nausea before eating solid foods. General instructions  Have a responsible adult stay with you until you are awake and alert.  Take over-the-counter and prescription medicines only as told by your health care provider.  If you smoke, do not smoke without supervision.  Keep all follow-up visits as told by your health care provider. This is important. Contact a health care provider if:  You keep feeling nauseous or you keep vomiting.  You feel light-headed.  You develop a rash.  You have a fever. Get help right away if:  You have trouble breathing. This information is not intended to replace advice given to you by your health care  provider. Make sure you discuss any questions you have with your health care provider. Document Released: 02-20-2012 Document Revised: 05/31/2015 Document Reviewed: 04/17/2015 Elsevier Interactive Patient Education  2019 [employer].

## 2018-02-14 NOTE — Procedures (Signed)
  Procedure: R IJ Port placement   EBL:   minimal Complications:  none immediate  See full dictation in [facility] PACS.  D. Chelsey Redondo MD Main # [phone] Pager  [phone]    

## 2018-02-14 NOTE — H&P (Signed)
Chief Complaint: Patient was seen in consultation today for rectal cancer.  Referring Physician(s): Owens Shark  Supervising Physician: Arne Cleveland  Patient Status: [facility] - Out-pt  History of Present Illness: Austin Yu is a 73 y.o. male with a past medical history of hyperlipidemia, pulmonary fibrosis, GERD, nephrolithiasis, BPH, and rectal cancer. He was unfortunately diagnosed with rectal cancer in 01/2018. His cancer is managed by [name], NP. He has tentative plans to begin chemotherapy.  IR requested by [name], NP for possible image-guided Port-a-cath insertion. Patient awake and alert laying in bed with no complaints at this time. Denies fever, chills, chest pain, dyspnea, abdominal pain, dizziness, or headache.   Past Medical History:  Diagnosis Date  . Abnormal chest sounds   . BPH (benign prostatic hypertrophy)   . Cancer [facility])    rectum cancer; dx 01/21/2018 by Dr. Loletha Carrow by colonoscopy  . Complication of anesthesia    ISSUE W/ EXTUBATION 05-18-2015 IN [facility] -- REFER TO ANESTHESIA NOTE''S  . Elevated PSA   . GERD (gastroesophageal reflux disease)   . History of airway aspiration   . History of kidney stones   . Hyperlipidemia   . Hyperplasia of prostate without lower urinary tract symptoms (LUTS)   . Left ureteral stone   . Lung nodule   . Nephrolithiasis   . Postinflammatory pulmonary fibrosis (HCC)    PULMOLOGIST-  DR [facility]-  IDIOPATHIC   . Pulmonary fibrosis ([place])   . Strawberry hemangioma of skin   . Upper airway cough syndrome    PULMOLOGIST-  DR GXQJ    Past Surgical History:  Procedure Laterality Date  . BIOPSY  01/21/2018   Procedure: BIOPSY;  Surgeon: Doran Stabler, MD;  Location: [facility];  Service: Gastroenterology;;  . COLONOSCOPY WITH PROPOFOL N/A 01/21/2018   Procedure: COLONOSCOPY WITH PROPOFOL;  Surgeon: Doran Stabler, MD;  Location: [facility];  Service: Gastroenterology;  Laterality: N/A;  . CYSTOSCOPY WITH  HOLMIUM LASER LITHOTRIPSY Left 06/01/2015   Procedure: CYSTOSCOPY WITH HOLMIUM LASER LITHOTRIPSY;  Surgeon: Irine Seal, MD;  Location: [facility];  Service: Urology;  Laterality: Left;  . CYSTOSCOPY WITH URETEROSCOPY, STONE BASKETRY AND STENT PLACEMENT Left 05/18/2015   Procedure: CYSTOSCOPY WITH STENT PLACEMENT AND RETROGRADE PYELOGRAM;  Surgeon: Irine Seal, MD;  Location: [facility];  Service: Urology;  Laterality: Left;  . CYSTOSCOPY WITH URETEROSCOPY, STONE BASKETRY AND STENT PLACEMENT Left 06/01/2015   Procedure: CYSTOSCOPY WITH URETEROSCOPY, STONE BASKETRY AND STENT PLACEMENT;  Surgeon: Irine Seal, MD;  Location: [facility];  Service: Urology;  Laterality: Left;  . EXTRACORPOREAL SHOCK WAVE LITHOTRIPSY  2007  . TONSILLECTOMY  age 4-4  . VIDEO BRONCHOSCOPY WITH ENDOBRONCHIAL NAVIGATION Left 02/07/2018   Procedure: VIDEO BRONCHOSCOPY WITH ENDOBRONCHIAL NAVIGATION;  Surgeon: Collene Gobble, MD;  Location: [place];  Service: Thoracic;  Laterality: Left;  Marland Kitchen VIDEO BRONCHOSCOPY WITH ENDOBRONCHIAL ULTRASOUND Left 02/07/2018   Procedure: VIDEO BRONCHOSCOPY WITH ENDOBRONCHIAL ULTRASOUND;  Surgeon: Collene Gobble, MD;  Location: [place];  Service: Thoracic;  Laterality: Left;    Allergies: Patient has no known allergies.  Medications: Prior to Admission medications   Medication Sig Start Date End Date Taking? Authorizing Provider  famotidine (PEPCID) 20 MG tablet Take 1 tablet (20 mg total) by mouth at bedtime. 08/01/17  Yes Tanda Rockers, MD  tamsulosin (FLOMAX) 0.4 MG CAPS capsule Take 0.4 mg by mouth at bedtime.  05/07/15  Yes [provider]  lidocaine-prilocaine (EMLA)  cream Apply to Port-A-Cath 1 to 2 hours prior to use 02/12/18   Owens Shark, NP  Nintedanib (OFEV) 150 MG CAPS Take 150 mg by mouth 2 (two) times daily.  12/14/16   [provider]  pantoprazole (PROTONIX) 40 MG tablet TAKE 1 TABLET BY MOUTH EVERY MORNING( 30 TO 60 MINUTES BEFORE FIRST MEAL  OF THE DAY) Patient taking differently: Take 40 mg by mouth daily.  10/01/17   Tanda Rockers, MD  prochlorperazine (COMPAZINE) 10 MG tablet Take 1 tablet (10 mg total) by mouth every 6 (six) hours as needed for nausea or vomiting. 02/12/18   Owens Shark, NP     Family History  Problem Relation Age of Onset  . Heart failure Father        CHF  . Healthy Brother   . Healthy Daughter   . Healthy Daughter     Social History   Socioeconomic History  . Marital status: Widowed    Spouse name: Not on file  . Number of children: Not on file  . Years of education: Not on file  . Highest education level: Not on file  Occupational History  . Occupation: Retired  Scientific laboratory technician  . Financial resource strain: Not on file  . Food insecurity:    Worry: Not on file    Inability: Not on file  . Transportation needs:    Medical: No    Non-medical: No  Tobacco Use  . Smoking status: Never Smoker  . Smokeless tobacco: Never Used  Substance and Sexual Activity  . Alcohol use: Yes    Alcohol/week: 0.0 standard drinks    Comment: occasional  . Drug use: No  . Sexual activity: Not on file  Lifestyle  . Physical activity:    Days per week: Not on file    Minutes per session: Not on file  . Stress: Not on file  Relationships  . Social connections:    Talks on phone: Not on file    Gets together: Not on file    Attends religious service: Not on file    Active member of club or organization: Not on file    Attends meetings of clubs or organizations: Not on file    Relationship status: Not on file  Other Topics Concern  . Not on file  Social History Narrative  . Not on file     Review of Systems: A 12 point ROS discussed and pertinent positives are indicated in the HPI above.  All other systems are negative.  Review of Systems  Constitutional: Negative for chills and fever.  Respiratory: Negative for shortness of breath and wheezing.   Cardiovascular: Negative for chest pain and  palpitations.  Gastrointestinal: Negative for abdominal pain.  Neurological: Negative for dizziness and headaches.  Psychiatric/Behavioral: Negative for behavioral problems and confusion.    Vital Signs: BP 110/70 (BP Location: Right Arm)   Pulse 93   Temp 97.9 F (36.6 C) (Oral)   Resp 18   SpO2 97%   Physical Exam Vitals signs and nursing note reviewed.  Constitutional:      General: He is not in acute distress.    Appearance: Normal appearance.  Cardiovascular:     Rate and Rhythm: Normal rate and regular rhythm.     Heart sounds: Normal heart sounds. No murmur.  Pulmonary:     Effort: Pulmonary effort is normal. No respiratory distress.     Breath sounds: Normal breath sounds. No wheezing.  Skin:  General: Skin is warm and dry.  Neurological:     Mental Status: He is alert and oriented to person, place, and time.  Psychiatric:        Mood and Affect: Mood normal.        Behavior: Behavior normal.        Thought Content: Thought content normal.        Judgment: Judgment normal.      MD Evaluation Airway: WNL Heart: WNL Abdomen: WNL Chest/ Lungs: WNL ASA  Classification: 3 Mallampati/Airway Score: Two   Imaging: Ct Chest W Contrast  Result Date: 01/29/2018 CLINICAL DATA:  Newly diagnosed rectal carcinoma. Staging. Pulmonary interstitial fibrosis. EXAM: CT CHEST, ABDOMEN, AND PELVIS WITH CONTRAST TECHNIQUE: Multidetector CT imaging of the chest, abdomen and pelvis was performed following the standard protocol during bolus administration of intravenous contrast. CONTRAST:  1107mL OMNIPAQUE IOHEXOL 300 MG/ML  SOLN COMPARISON:  Noncontrast chest CT on 03/22/2017 FINDINGS: CT CHEST FINDINGS Cardiovascular: No acute findings. Mediastinum/Lymph Nodes: A new left infrahilar mass or lymphadenopathy is seen which measures 3.4 x 2.6 cm. Additional enlarged left hilar lymph node measures 1.7 cm, and mild subcarinal lymphadenopathy with largest lymph node measuring 1.4 cm.  These findings are new since previous study. Lungs/Pleura: Pulmonary interstitial fibrosis is again demonstrated. A more confluent ill-defined area of opacity is now seen within in area fibrosis in the posterior left lower lobe which measures approximately 2.0 x 1.5 cm on image 96/6. This could be due to increased confluency fibrosis or carcinoma. No evidence of pleural effusion. Musculoskeletal:  No suspicious bone lesions identified. CT ABDOMEN AND PELVIS FINDINGS Hepatobiliary: No masses identified. Mild diffuse hepatic steatosis. Gallstones are seen, however there is no evidence of cholecystitis or biliary dilatation. Pancreas:  No mass or inflammatory changes. Spleen:  Within normal limits in size and appearance. Adrenals/Urinary tract: No masses or hydronephrosis. Bilateral renal parenchymal scarring and several small cysts. Bilateral nephrolithiasis. A 3 mm distal right ureteral calculus is seen although there is no evidence of hydroureteronephrosis. A few tiny calculi are also noted in the urinary bladder. Stomach/Bowel: A soft tissue mass involving the right lateral wall of the rectum which measures approximately 2 cm, consistent with known primary rectal carcinoma. Small hiatal hernia. No evidence of bowel obstruction or inflammatory process. Normal appendix visualized. Diverticulosis is seen mainly involving the sigmoid colon, however there is no evidence of diverticulitis. Vascular/Lymphatic: A few tiny perirectal lymph nodes are seen, largest on the right measuring 5 mm. No other pathologically enlarged lymph nodes are identified abdomen or pelvis. No evidence of abdominal aortic aneurysm. Reproductive:  Mildly enlarged prostate. Other:  None. Musculoskeletal:  No suspicious bone lesions identified. IMPRESSION: 1. 2 cm soft tissue mass involving the right lateral rectal wall, consistent with known primary rectal carcinoma. 2. Tiny perirectal lymph nodes measuring up to 5 mm, suspicious for local lymph  node metastases. No other sites of metastatic disease within the abdomen or pelvis. 3. New 3.4 cm left infrahilar mass with mild left hilar and subcarinal lymphadenopathy. This is highly suspicious for primary bronchogenic carcinoma rather than metastatic disease. PET-CT scan is recommended for further evaluation. 4. New 2 cm confluent opacity in area of fibrosis in the posterior left lower lobe. Differential diagnosis includes worsening confluent fibrosis versus bronchogenic carcinoma. This can also be assessed on PET-CT scan. Other findings: 1.  Cholelithiasis.  No radiographic evidence of cholecystitis. 2. Colonic diverticulosis, without radiographic evidence of diverticulitis. 3.  Mildly enlarged prostate. 4. 3 mm distal  right ureteral calculus, without hydroureteronephrosis. 5.  Bilateral nephrolithiasis and tiny bladder calculi. 6.  Small hiatal hernia. These results will be called to the ordering clinician or representative by the Radiologist Assistant, and communication documented in the PACS or zVision Dashboard. Electronically Signed   By: Earle Gell M.D.   On: 01/29/2018 09:23   Mr Pelvis W YK Contrast  Result Date: 01/28/2018 CLINICAL DATA:  Newly diagnosed rectal carcinoma.  Staging. EXAM: MRI PELVIS WITHOUT AND WITH CONTRAST TECHNIQUE: Multiplanar multisequence MR imaging of the pelvis was performed both before and after administration of intravenous contrast. Small amount of [place] gel was administered per rectum to optimize tumor evaluation. CONTRAST:  8 mL Gadavist COMPARISON:  None. FINDINGS: TUMOR LOCATION Tumor distance from Anal Verge/Skin Surface:  10.6 cm Tumor distance from Internal Anal Sphincter:  6.6 cm TUMOR DESCRIPTION Circumferential Extent: Focal polypoid intraluminal mass involves the right anterolateral wall at the 10 and 11 o'clock positions Tumor Length: 2.3 cm T - CATEGORY Extension through Muscularis Propria: Yes, measuring 4 mm = T3b Shortest Distance of any tumor/node from  Mesorectal Fascia: 6 mm from right lateral perirectal lymph node to adjacent mesorectal fascia Extramural Vascular Invasion/Tumor Thrombus: No Invasion of Anterior Peritoneal Reflection: No Involvement of Adjacent Organs or Pelvic Sidewall: No Levator Ani Involvement: No N - CATEGORY Mesorectal Lymph Nodes >=18mm: 2 mesorectal lymph nodes are seen along the anterior right lateral wall of the rectum, largest measuring 6 mm = N1 Extra-mesorectal Lymphadenopathy: No Other:  None. IMPRESSION: Rectal adenocarcinoma T stage:  T3b Rectal adenocarcinoma N stage:  N1 Distance from tumor to the internal anal sphincter is 6.6 cm. Electronically Signed   By: Earle Gell M.D.   On: 01/28/2018 14:24   Ct Abdomen Pelvis W Contrast  Result Date: 01/29/2018 CLINICAL DATA:  Newly diagnosed rectal carcinoma. Staging. Pulmonary interstitial fibrosis. EXAM: CT CHEST, ABDOMEN, AND PELVIS WITH CONTRAST TECHNIQUE: Multidetector CT imaging of the chest, abdomen and pelvis was performed following the standard protocol during bolus administration of intravenous contrast. CONTRAST:  164mL OMNIPAQUE IOHEXOL 300 MG/ML  SOLN COMPARISON:  Noncontrast chest CT on 03/22/2017 FINDINGS: CT CHEST FINDINGS Cardiovascular: No acute findings. Mediastinum/Lymph Nodes: A new left infrahilar mass or lymphadenopathy is seen which measures 3.4 x 2.6 cm. Additional enlarged left hilar lymph node measures 1.7 cm, and mild subcarinal lymphadenopathy with largest lymph node measuring 1.4 cm. These findings are new since previous study. Lungs/Pleura: Pulmonary interstitial fibrosis is again demonstrated. A more confluent ill-defined area of opacity is now seen within in area fibrosis in the posterior left lower lobe which measures approximately 2.0 x 1.5 cm on image 96/6. This could be due to increased confluency fibrosis or carcinoma. No evidence of pleural effusion. Musculoskeletal:  No suspicious bone lesions identified. CT ABDOMEN AND PELVIS FINDINGS  Hepatobiliary: No masses identified. Mild diffuse hepatic steatosis. Gallstones are seen, however there is no evidence of cholecystitis or biliary dilatation. Pancreas:  No mass or inflammatory changes. Spleen:  Within normal limits in size and appearance. Adrenals/Urinary tract: No masses or hydronephrosis. Bilateral renal parenchymal scarring and several small cysts. Bilateral nephrolithiasis. A 3 mm distal right ureteral calculus is seen although there is no evidence of hydroureteronephrosis. A few tiny calculi are also noted in the urinary bladder. Stomach/Bowel: A soft tissue mass involving the right lateral wall of the rectum which measures approximately 2 cm, consistent with known primary rectal carcinoma. Small hiatal hernia. No evidence of bowel obstruction or inflammatory process. Normal appendix visualized. Diverticulosis  is seen mainly involving the sigmoid colon, however there is no evidence of diverticulitis. Vascular/Lymphatic: A few tiny perirectal lymph nodes are seen, largest on the right measuring 5 mm. No other pathologically enlarged lymph nodes are identified abdomen or pelvis. No evidence of abdominal aortic aneurysm. Reproductive:  Mildly enlarged prostate. Other:  None. Musculoskeletal:  No suspicious bone lesions identified. IMPRESSION: 1. 2 cm soft tissue mass involving the right lateral rectal wall, consistent with known primary rectal carcinoma. 2. Tiny perirectal lymph nodes measuring up to 5 mm, suspicious for local lymph node metastases. No other sites of metastatic disease within the abdomen or pelvis. 3. New 3.4 cm left infrahilar mass with mild left hilar and subcarinal lymphadenopathy. This is highly suspicious for primary bronchogenic carcinoma rather than metastatic disease. PET-CT scan is recommended for further evaluation. 4. New 2 cm confluent opacity in area of fibrosis in the posterior left lower lobe. Differential diagnosis includes worsening confluent fibrosis versus  bronchogenic carcinoma. This can also be assessed on PET-CT scan. Other findings: 1.  Cholelithiasis.  No radiographic evidence of cholecystitis. 2. Colonic diverticulosis, without radiographic evidence of diverticulitis. 3.  Mildly enlarged prostate. 4. 3 mm distal right ureteral calculus, without hydroureteronephrosis. 5.  Bilateral nephrolithiasis and tiny bladder calculi. 6.  Small hiatal hernia. These results will be called to the ordering clinician or representative by the Radiologist Assistant, and communication documented in the PACS or zVision Dashboard. Electronically Signed   By: Earle Gell M.D.   On: 01/29/2018 09:23   Nm Pet Image Restag (ps) Skull Base To Thigh  Result Date: 02/01/2018 CLINICAL DATA:  Subsequent treatment strategy for lung carcinoma. Non-small cell lung cancer. EXAM: NUCLEAR MEDICINE PET SKULL BASE TO THIGH TECHNIQUE: 10.2 mCi F-18 FDG was injected intravenously. Full-ring PET imaging was performed from the skull base to thigh after the radiotracer. CT data was obtained and used for attenuation correction and anatomic localization. Fasting blood glucose: 86 mg/dl COMPARISON:  CT 01/29/2018, PET-CT 07/26/2016 FINDINGS: Mediastinal blood pool activity: SUV max 3.0 NECK: No hypermetabolic lymph nodes in the neck. Incidental CT findings: none CHEST: Intense metabolic activity associated with a LEFT infrahilar node/mass measuring 3.1 x 2.4 cm SUV max equal 6.7. Within the LEFT lower lobe, there is a segmental consolidation with central nodularity. The central nodular portion measures 1.7 x 1.1 cm with SUV max equal 8.8. These findings are new from PET-CT scan 07/26/2016. The LEFT lower lobe consolidation is increased over several exams. Incidental CT findings: none ABDOMEN/PELVIS: No abnormal activity in the liver. No hypermetabolic abdominal lymph nodes. No hypermetabolic pelvic lymph nodes. There is activity through the rectum associated with the eccentric mass SUV max equal 8.9. No  hypermetabolic nodes in the pelvis. Incidental CT findings: Bilateral nonobstructing renal calculi. Small nonobstructing calculus in the distal LEFT ureter measuring 3 mm and position approximately 3 cm from the vascular junction. Several small calculi within the bladder. SKELETON: No focal hypermetabolic activity to suggest skeletal metastasis. Incidental CT findings: none IMPRESSION: 1. Hypermetabolic LEFT infrahilar mass consistent with metastatic carcinoma. Consider lung carcinoma versus rectal carcinoma. 2. Hypermetabolic nodule within a field of LEFT lower lobe consolidation with differential including malignancy versus focus of infection. Favor malignancy. 3. Focal hypermetabolic activity within the rectum consistent rectal carcinoma. 4. No evidence of local nodal metastasis within the pelvis. No evidence of metastatic disease in the abdomen. 5. Bilateral nephrolithiasis and nonobstructing distal RIGHT ureteral calculus. Electronically Signed   By: Suzy Bouchard M.D.   On: 02/01/2018  14:02   Dg Chest Port 1 View  Result Date: 02/07/2018 CLINICAL DATA:  Status post bronchoscopic biopsy. EXAM: PORTABLE CHEST 1 VIEW COMPARISON:  Chest CT 02/05/2018 FINDINGS: The heart is upper limits of normal in size given the AP projection and portable technique and low lung volumes. Hilar and mediastinal adenopathy as demonstrated on the recent chest CT and PET-CT. Chronic pulmonary fibrosis. No postprocedural pneumothorax. IMPRESSION: Chronic lung changes and mediastinal and hilar adenopathy. No postprocedural pneumothorax is identified. Electronically Signed   By: Marijo Sanes M.D.   On: 02/07/2018 17:28   Ct Super D Chest Wo Contrast  Result Date: 02/05/2018 CLINICAL DATA:  Evaluate for lung metastasis. History of rectal cancer. EXAM: CT CHEST WITHOUT CONTRAST TECHNIQUE: Multidetector CT imaging of the chest was performed using thin slice collimation for electromagnetic bronchoscopy planning purposes, without  intravenous contrast. COMPARISON:  02/01/2018 FINDINGS: Cardiovascular: The heart size appears within normal limits. No pericardial effusion. Mediastinum/Nodes: Normal appearance of the thyroid gland. The trachea appears patent and is midline. Normal appearance of the esophagus. No axillary adenopathy. Enlarged posterior mediastinal lymph node between the descending aorta and esophagus measures 1.7 cm, image 66/2. Additional enlarged posterior mediastinal lymph node ventral to the descending thoracic aorta measures 1.4 cm, image 83/2. Left hilar lymph node measures 1.9 cm, image 65/2. Previously this measured the same. Left infrahilar lymph node Measures the 2.9 x 2.5 cm, image 75/2. Lungs/Pleura: Advanced changes of interstitial lung disease is again noted peripheral and lower lobe predominant areas of ground-glass attenuation are noted. Focal nodular density within the left lower lobe is again noted measuring 2 cm, image 99/3. Similar to previous exam. This is a new abnormality compared with 03/22/2017. Upper Abdomen: No acute abnormality identified within the abdomen. Multiple gallstones are identified. Musculoskeletal: No aggressive lytic or sclerotic bone lesions. IMPRESSION: 1. Persistent, hypermetabolic left lower lobe nodule measuring 2 cm. 2. Again seen are enlarged hypermetabolic left hilar and mediastinal lymph nodes. Suspicious for metastatic adenopathy. 3. Similar appearance of chronic interstitial lung disease compatible with pulmonary fibrosis. 4. Gallstones Electronically Signed   By: Kerby Moors M.D.   On: 02/05/2018 11:44   Dg C-arm Bronchoscopy  Result Date: 02/07/2018 C-ARM BRONCHOSCOPY: Fluoroscopy was utilized by the requesting physician.  No radiographic interpretation.    Labs:  CBC: Recent Labs    01/24/18 0730 02/05/18 1020 02/13/18 1019  WBC 8.3 7.1 9.8  HGB 16.7 16.8 16.7  HCT 49.3 53.1* 51.8  PLT 252.0 242 275    COAGS: Recent Labs    02/05/18 1020  INR 1.05    APTT 33    BMP: Recent Labs    01/24/18 0730 01/28/18 1307 02/05/18 1020 02/13/18 1019  NA 140  --  140 141  K 3.5  --  4.0 4.6  CL 104  --  106 105  CO2 25  --  25 28  GLUCOSE 97  --  138* 105*  BUN 16  --  14 19  CALCIUM 9.4  --  9.4 9.8  CREATININE 1.31 1.30* 1.26* 1.33*  GFRNONAA  --   --  57* 53*  GFRAA  --   --  >60 >60    LIVER FUNCTION TESTS: Recent Labs    01/24/18 0730 02/13/18 1019  BILITOT 0.5 0.4  AST 18 17  ALT 16 17  ALKPHOS 50 58  PROT 7.5 8.0  ALBUMIN 4.0 3.6    TUMOR MARKERS: Recent Labs    01/24/18 0730  CEA 10.4*  Assessment and Plan:  Rectal cancer with tentative plans to begin chemotherapy. Plan for image-guided Port-a-cath placement today with Dr. Vernard Gambles. Patient is NPO. Afebrile and WBCs WNL. He does not take blood thinners. INR 1.05 seconds 02/05/2018.  Risks and benefits of image guided port-a-catheter placement was discussed with the patient including, but not limited to bleeding, infection, pneumothorax, or fibrin sheath development and need for additional procedures. All of the patient's questions were answered, patient is agreeable to proceed. Consent signed and in chart.   Thank you for this interesting consult.  I greatly enjoyed meeting Austin Yu and look forward to participating in their care.  A copy of this report was sent to the requesting provider on this date.  Electronically Signed: Earley Abide, PA-C 02/14/2018, 8:11 AM   I spent a total of 40 Minutes in face to face in clinical consultation, greater than 50% of which was counseling/coordinating care for rectal cancer.

## 2018-02-16 ENCOUNTER — Other Ambulatory Visit: Payer: Self-pay | Admitting: Oncology

## 2018-02-19 ENCOUNTER — Telehealth: Payer: Self-pay | Admitting: *Deleted

## 2018-02-19 DIAGNOSIS — Z006 Encounter for examination for normal comparison and control in clinical research program: Secondary | ICD-10-CM

## 2018-02-19 NOTE — Telephone Encounter (Signed)
[employer] 2018-01 STUDY; BLOOD SAMPLE COLLECTION TO EVALUATE BIOMARKERS INSUBJECTS WITH UNTREATED SOLID TUMORS.  Dr. Benay Spice referred patient for above study. I called patient and informed him of this study whichinvolves a one time collection of blood and demographic/medical history information to be used for research. Informed patient that participationis voluntary. Blood must be collected prior to starting any cancer treatment and patients will be given a $50 [employer] gift card for their participation. Patient stated he was interested in participating in this study. Patient agreed to come in early before his sees Dr. Benay Spice and starts Chemo on this Thursday 2/13.  Informed patient I will schedule him to see research nurse to review and sign consent, followed by lab appointment. He agreed to come in at 8:45 am.  Thanked patient very much and look forward to meeting him this Thursday.  Foye Spurling, BSN, RN Clinical Research Nurse 02/19/2018 11:54 AM

## 2018-02-21 ENCOUNTER — Inpatient Hospital Stay: Payer: PPO

## 2018-02-21 ENCOUNTER — Inpatient Hospital Stay (HOSPITAL_BASED_OUTPATIENT_CLINIC_OR_DEPARTMENT_OTHER): Payer: PPO | Admitting: Oncology

## 2018-02-21 ENCOUNTER — Inpatient Hospital Stay: Payer: PPO | Admitting: *Deleted

## 2018-02-21 ENCOUNTER — Telehealth: Payer: Self-pay | Admitting: Oncology

## 2018-02-21 VITALS — BP 113/81 | HR 100 | Temp 97.8°F | Resp 18 | Ht 67.0 in | Wt 189.4 lb

## 2018-02-21 DIAGNOSIS — J8410 Pulmonary fibrosis, unspecified: Secondary | ICD-10-CM | POA: Diagnosis not present

## 2018-02-21 DIAGNOSIS — Z5111 Encounter for antineoplastic chemotherapy: Secondary | ICD-10-CM | POA: Diagnosis not present

## 2018-02-21 DIAGNOSIS — Z006 Encounter for examination for normal comparison and control in clinical research program: Secondary | ICD-10-CM

## 2018-02-21 DIAGNOSIS — N200 Calculus of kidney: Secondary | ICD-10-CM

## 2018-02-21 DIAGNOSIS — C20 Malignant neoplasm of rectum: Secondary | ICD-10-CM | POA: Diagnosis not present

## 2018-02-21 DIAGNOSIS — C771 Secondary and unspecified malignant neoplasm of intrathoracic lymph nodes: Secondary | ICD-10-CM | POA: Diagnosis not present

## 2018-02-21 DIAGNOSIS — N289 Disorder of kidney and ureter, unspecified: Secondary | ICD-10-CM | POA: Diagnosis not present

## 2018-02-21 LAB — RESEARCH LABS

## 2018-02-21 MED ORDER — PALONOSETRON HCL INJECTION 0.25 MG/5ML
INTRAVENOUS | Status: AC
  Filled 2018-02-21: qty 5

## 2018-02-21 MED ORDER — OXALIPLATIN CHEMO INJECTION 100 MG/20ML
85.0000 mg/m2 | Freq: Once | INTRAVENOUS | Status: AC
  Administered 2018-02-21: 170 mg via INTRAVENOUS
  Filled 2018-02-21: qty 34

## 2018-02-21 MED ORDER — SODIUM CHLORIDE 0.9% FLUSH
10.0000 mL | INTRAVENOUS | Status: DC | PRN
  Filled 2018-02-21: qty 10

## 2018-02-21 MED ORDER — DEXAMETHASONE SODIUM PHOSPHATE 10 MG/ML IJ SOLN
10.0000 mg | Freq: Once | INTRAMUSCULAR | Status: AC
  Administered 2018-02-21: 10 mg via INTRAVENOUS

## 2018-02-21 MED ORDER — DEXAMETHASONE SODIUM PHOSPHATE 10 MG/ML IJ SOLN
INTRAMUSCULAR | Status: AC
  Filled 2018-02-21: qty 1

## 2018-02-21 MED ORDER — FLUOROURACIL CHEMO INJECTION 2.5 GM/50ML
400.0000 mg/m2 | Freq: Once | INTRAVENOUS | Status: AC
  Administered 2018-02-21: 800 mg via INTRAVENOUS
  Filled 2018-02-21: qty 16

## 2018-02-21 MED ORDER — SODIUM CHLORIDE 0.9 % IV SOLN
2400.0000 mg/m2 | INTRAVENOUS | Status: DC
  Administered 2018-02-21: 4850 mg via INTRAVENOUS
  Filled 2018-02-21: qty 97

## 2018-02-21 MED ORDER — PALONOSETRON HCL INJECTION 0.25 MG/5ML
0.2500 mg | Freq: Once | INTRAVENOUS | Status: AC
  Administered 2018-02-21: 0.25 mg via INTRAVENOUS

## 2018-02-21 MED ORDER — DEXTROSE 5 % IV SOLN
Freq: Once | INTRAVENOUS | Status: AC
  Administered 2018-02-21: 11:00:00 via INTRAVENOUS
  Filled 2018-02-21: qty 250

## 2018-02-21 MED ORDER — HEPARIN SOD (PORK) LOCK FLUSH 100 UNIT/ML IV SOLN
500.0000 [IU] | Freq: Once | INTRAVENOUS | Status: DC | PRN
  Filled 2018-02-21: qty 5

## 2018-02-21 MED ORDER — LEUCOVORIN CALCIUM INJECTION 350 MG
400.0000 mg/m2 | Freq: Once | INTRAVENOUS | Status: AC
  Administered 2018-02-21: 808 mg via INTRAVENOUS
  Filled 2018-02-21: qty 40.4

## 2018-02-21 NOTE — Telephone Encounter (Signed)
Scheduled appt per 2/13 los - pt to get an  Updated schedule next visit.

## 2018-02-21 NOTE — Progress Notes (Signed)
[facility] OFFICE PROGRESS NOTE   Diagnosis: Rectal cancer   INTERVAL HISTORY:   Austin Yu returns as scheduled.  He underwent Port-A-Cath placement 02/14/2018.  No difficulty with bowel function.  He is scheduled to begin FOLFOX chemotherapy today.  Objective:  Vital signs in last 24 hours:  Blood pressure 113/81, pulse 100, temperature 97.8 F (36.6 C), temperature source Oral, resp. rate 18, height 5\' 7"  (1.702 m), weight 189 lb 6.4 oz (85.9 kg), SpO2 94 %.    Resp: Diffuse inspiratory rales at the posterior chest, no respiratory distress Cardio: Regular rate and rhythm GI: No hepatomegaly, nontender Vascular: No leg edema   Portacath/PICC-without erythema  Lab Results:  Lab Results  Component Value Date   WBC 9.8 02/13/2018   HGB 16.7 02/13/2018   HCT 51.8 02/13/2018   MCV 92.5 02/13/2018   PLT 275 02/13/2018   NEUTROABS 6.0 02/13/2018    CMP  Lab Results  Component Value Date   NA 141 02/13/2018   K 4.6 02/13/2018   CL 105 02/13/2018   CO2 28 02/13/2018   GLUCOSE 105 (H) 02/13/2018   BUN 19 02/13/2018   CREATININE 1.33 (H) 02/13/2018   CALCIUM 9.8 02/13/2018   PROT 8.0 02/13/2018   ALBUMIN 3.6 02/13/2018   AST 17 02/13/2018   ALT 17 02/13/2018   ALKPHOS 58 02/13/2018   BILITOT 0.4 02/13/2018   GFRNONAA 53 (L) 02/13/2018   GFRAA >60 02/13/2018    Lab Results  Component Value Date   CEA1 21.75 (H) 02/13/2018    Medications: I have reviewed the patient's current medications.   Assessment/Plan:  1. Rectal cancer ? Distal rectal mass noted on colonoscopy 01/21/2018, biopsy confirmed invasive adenocarcinoma, cytokeratin 20 (patchy) and CDX 2+, cytokeratin 7 and TTF-1 negative ? CTs 01/29/2018- new left infrahilar mass, enlarged left hilar lymph node, mild subcarinal lymphadenopathy, confluent ill-defined lesion in area fibrosis at the posterior left lower lobe measuring 2 x 1.5 cm, rectal mass, perirectal lymph nodes ? MRI  01/28/2018-T3bN1 tumor located at 6.6 cm from the internal anal sphincter, 2 mesorectal lymph nodes ? Hypermetabolic left infrahilar mass.  Hypermetabolic nodule left lower lobe.  Focal hypermetabolic activity within the rectum. ? Bronchoscopy 02/07/2018- no endobronchial lesions or abnormal secretions seen.  Transbronchial brushings obtained from the left lower lobe nodule.  Significant enlargement of the station 7, station 10 L, station 11 L nodes.  Needle biopsies were taken from station 7 and 11 L nodes. ? Pathology from the left lower lobe brushings, a level 7 lymph node returned positive for adenocarcinoma, positive for cytokeratin 7 and CDX2, negative for cytokeratin 20 and TTF-1. 2. Pulmonary fibrosis 3. Kidney stones 4. BPH 5. History of gastroesophageal reflux 6. Mild renal insufficiency 7. Port-A-Cath placement 02/14/2018   Disposition: Mr. Barham appears stable.  I discussed the pathology from the bronchoscopy biopsies with him.  The biopsies from the left lower lobe and a level 7 node are positive for adenocarcinoma.  The immunohistochemical profile does not match exactly between the rectal and chest biopsies.  I discussed the case with pathology.  The rectal tumor appears to represent a primary rectal adenocarcinoma.  I think it is very likely the lung findings are related to metastatic rectal cancer.  He is a never smoker.  The plan is to proceed with FOLFOX chemotherapy.  We again reviewed potential toxicities associated with this regimen.  I will present his case at the GI tumor conference for further review of the pathology.  We will request [facility] testing on the biopsy material.  Mr. Zeimet will return for an office visit in the next cycle of chemotherapy in 2 weeks.  Bevacizumab will be added beginning with cycle 2.  Betsy Coder, MD  02/21/2018  9:28 AM

## 2018-02-21 NOTE — Research (Signed)
[employer] 201801   Met unaccompanied patient prior to his lab appointment today for approximately 20 minutes. Reviewed consent form for protocol version 3.0 , IRB approved 11/06/2017, in it's entirety, including the purpose, risks and possible benefits of this study. Patient was reminded that participation in the study is voluntary and it involves drawing approximately 8 teaspoons of blood and sharing some medical and demographic information. Patient denied any questions. Patient signed and dated the [employer] embedded informed consent form. After pt signed form, this RN spent approximately 5 minutes asking patient family cancer history and his tobacco and alcohol history. Thanked patient for his participation and escorted him to the lobby to check in for his lab appointment.  Research assistant, Farris Has, supplied research tubes and instructions to draw labs per protocol to lab staff. She will also give the patient the [employer] gift card and a copy of his signed consent form after his blood is drawn today.  Patient meets all eligibility criteria and eligibility was confirmed by research RN, Otilio Miu. Dr. Benay Spice also agrees that patient meets the eligibility criteria and none of the exclusion criteria to be enrolled on this study.  Research blood was drawn without difficulty by venipuncture per protocol using Kit # [id number]. Patient tolerated well without any adverse events. Pt will be enrolled in the study with assigned PID [id number]. Johney Maine RN, BSN Clinical Research  02/21/2018 10:17 AM

## 2018-02-21 NOTE — Progress Notes (Signed)
Pt does not need labs today. He had them done last week. Today is his 1st treatment.

## 2018-02-21 NOTE — Patient Instructions (Addendum)
[facility] Discharge Instructions for Patients Receiving Chemotherapy  Today you received the following chemotherapy agents Oxaliplatin, Leucovorin and Adrucil   To help prevent nausea and vomiting after your treatment, we encourage you to take your nausea medication as directed.    If you develop nausea and vomiting that is not controlled by your nausea medication, call the clinic.   BELOW ARE SYMPTOMS THAT SHOULD BE REPORTED IMMEDIATELY:  *FEVER GREATER THAN 100.5 F  *CHILLS WITH OR WITHOUT FEVER  NAUSEA AND VOMITING THAT IS NOT CONTROLLED WITH YOUR NAUSEA MEDICATION  *UNUSUAL SHORTNESS OF BREATH  *UNUSUAL BRUISING OR BLEEDING  TENDERNESS IN MOUTH AND THROAT WITH OR WITHOUT PRESENCE OF ULCERS  *URINARY PROBLEMS  *BOWEL PROBLEMS  UNUSUAL RASH Items with * indicate a potential emergency and should be followed up as soon as possible.  Feel free to call the clinic should you have any questions or concerns. The clinic phone number is [phone].  Please show the [place] at check-in to the Emergency Department and triage nurse.  Oxaliplatin Injection What is this medicine? OXALIPLATIN (ox AL i PLA tin) is a chemotherapy drug. It targets fast dividing cells, like cancer cells, and causes these cells to die. This medicine is used to treat cancers of the colon and rectum, and many other cancers. This medicine may be used for other purposes; ask your health care provider or pharmacist if you have questions. COMMON BRAND NAME(S): Eloxatin What should I tell my health care provider before I take this medicine? They need to know if you have any of these conditions: -kidney disease -an unusual or allergic reaction to oxaliplatin, other chemotherapy, other medicines, foods, dyes, or preservatives -pregnant or trying to get pregnant -breast-feeding How should I use this medicine? This drug is given as an infusion into a vein. It is administered in a  hospital or clinic by a specially trained health care professional. Talk to your pediatrician regarding the use of this medicine in children. Special care may be needed. Overdosage: If you think you have taken too much of this medicine contact a poison control center or emergency room at once. NOTE: This medicine is only for you. Do not share this medicine with others. What if I miss a dose? It is important not to miss a dose. Call your doctor or health care professional if you are unable to keep an appointment. What may interact with this medicine? -medicines to increase blood counts like filgrastim, pegfilgrastim, sargramostim -probenecid -some antibiotics like amikacin, gentamicin, neomycin, polymyxin B, streptomycin, tobramycin -zalcitabine Talk to your doctor or health care professional before taking any of these medicines: -acetaminophen -aspirin -ibuprofen -ketoprofen -naproxen This list may not describe all possible interactions. Give your health care provider a list of all the medicines, herbs, non-prescription drugs, or dietary supplements you use. Also tell them if you smoke, drink alcohol, or use illegal drugs. Some items may interact with your medicine. What should I watch for while using this medicine? Your condition will be monitored carefully while you are receiving this medicine. You will need important blood work done while you are taking this medicine. This medicine can make you more sensitive to cold. Do not drink cold drinks or use ice. Cover exposed skin before coming in contact with cold temperatures or cold objects. When out in cold weather wear warm clothing and cover your mouth and nose to warm the air that goes into your lungs. Tell your doctor if you get sensitive to  the cold. This drug may make you feel generally unwell. This is not uncommon, as chemotherapy can affect healthy cells as well as cancer cells. Report any side effects. Continue your course of treatment  even though you feel ill unless your doctor tells you to stop. In some cases, you may be given additional medicines to help with side effects. Follow all directions for their use. Call your doctor or health care professional for advice if you get a fever, chills or sore throat, or other symptoms of a cold or flu. Do not treat yourself. This drug decreases your body's ability to fight infections. Try to avoid being around people who are sick. This medicine may increase your risk to bruise or bleed. Call your doctor or health care professional if you notice any unusual bleeding. Be careful brushing and flossing your teeth or using a toothpick because you may get an infection or bleed more easily. If you have any dental work done, tell your dentist you are receiving this medicine. Avoid taking products that contain aspirin, acetaminophen, ibuprofen, naproxen, or ketoprofen unless instructed by your doctor. These medicines may hide a fever. Do not become pregnant while taking this medicine. Women should inform their doctor if they wish to become pregnant or think they might be pregnant. There is a potential for serious side effects to an unborn child. Talk to your health care professional or pharmacist for more information. Do not breast-feed an infant while taking this medicine. Call your doctor or health care professional if you get diarrhea. Do not treat yourself. What side effects may I notice from receiving this medicine? Side effects that you should report to your doctor or health care professional as soon as possible: -allergic reactions like skin rash, itching or hives, swelling of the face, lips, or tongue -low blood counts - This drug may decrease the number of white blood cells, red blood cells and platelets. You may be at increased risk for infections and bleeding. -signs of infection - fever or chills, cough, sore throat, pain or difficulty passing urine -signs of decreased platelets or  bleeding - bruising, pinpoint red spots on the skin, black, tarry stools, nosebleeds -signs of decreased red blood cells - unusually weak or tired, fainting spells, lightheadedness -breathing problems -chest pain, pressure -cough -diarrhea -jaw tightness -mouth sores -nausea and vomiting -pain, swelling, redness or irritation at the injection site -pain, tingling, numbness in the hands or feet -problems with balance, talking, walking -redness, blistering, peeling or loosening of the skin, including inside the mouth -trouble passing urine or change in the amount of urine Side effects that usually do not require medical attention (report to your doctor or health care professional if they continue or are bothersome): -changes in vision -constipation -hair loss -loss of appetite -metallic taste in the mouth or changes in taste -stomach pain This list may not describe all possible side effects. Call your doctor for medical advice about side effects. You may report side effects to FDA at 1-800-FDA-1088. Where should I keep my medicine? This drug is given in a hospital or clinic and will not be stored at home. NOTE: This sheet is a summary. It may not cover all possible information. If you have questions about this medicine, talk to your doctor, pharmacist, or health care provider.  2019 Elsevier/Gold Standard (2007-07-23 17:22:47)   Leucovorin injection What is this medicine? LEUCOVORIN (loo koe VOR in) is used to prevent or treat the harmful effects of some medicines. This medicine is  used to treat anemia caused by a low amount of folic acid in the body. It is also used with 5-fluorouracil (5-FU) to treat colon cancer. This medicine may be used for other purposes; ask your health care provider or pharmacist if you have questions. What should I tell my health care provider before I take this medicine? They need to know if you have any of these conditions: -anemia from low levels of  vitamin B-12 in the blood -an unusual or allergic reaction to leucovorin, folic acid, other medicines, foods, dyes, or preservatives -pregnant or trying to get pregnant -breast-feeding How should I use this medicine? This medicine is for injection into a muscle or into a vein. It is given by a health care professional in a hospital or clinic setting. Talk to your pediatrician regarding the use of this medicine in children. Special care may be needed. Overdosage: If you think you have taken too much of this medicine contact a poison control center or emergency room at once. NOTE: This medicine is only for you. Do not share this medicine with others. What if I miss a dose? This does not apply. What may interact with this medicine? -capecitabine -fluorouracil -phenobarbital -phenytoin -primidone -trimethoprim-sulfamethoxazole This list may not describe all possible interactions. Give your health care provider a list of all the medicines, herbs, non-prescription drugs, or dietary supplements you use. Also tell them if you smoke, drink alcohol, or use illegal drugs. Some items may interact with your medicine. What should I watch for while using this medicine? Your condition will be monitored carefully while you are receiving this medicine. This medicine may increase the side effects of 5-fluorouracil, 5-FU. Tell your doctor or health care professional if you have diarrhea or mouth sores that do not get better or that get worse. What side effects may I notice from receiving this medicine? Side effects that you should report to your doctor or health care professional as soon as possible: -allergic reactions like skin rash, itching or hives, swelling of the face, lips, or tongue -breathing problems -fever, infection -mouth sores -unusual bleeding or bruising -unusually weak or tired Side effects that usually do not require medical attention (report to your doctor or health care professional if  they continue or are bothersome): -constipation or diarrhea -loss of appetite -nausea, vomiting This list may not describe all possible side effects. Call your doctor for medical advice about side effects. You may report side effects to FDA at 1-800-FDA-1088. Where should I keep my medicine? This drug is given in a hospital or clinic and will not be stored at home. NOTE: This sheet is a summary. It may not cover all possible information. If you have questions about this medicine, talk to your doctor, pharmacist, or health care provider.  2019 Elsevier/Gold Standard (2007-07-02 16:50:29)  Fluorouracil, 5-FU injection What is this medicine? FLUOROURACIL, 5-FU (flure oh YOOR a sil) is a chemotherapy drug. It slows the growth of cancer cells. This medicine is used to treat many types of cancer like breast cancer, colon or rectal cancer, pancreatic cancer, and stomach cancer. This medicine may be used for other purposes; ask your health care provider or pharmacist if you have questions. COMMON BRAND NAME(S): Adrucil What should I tell my health care provider before I take this medicine? They need to know if you have any of these conditions: -blood disorders -dihydropyrimidine dehydrogenase (DPD) deficiency -infection (especially a virus infection such as chickenpox, cold sores, or herpes) -kidney disease -liver disease -malnourished, poor  nutrition -recent or ongoing radiation therapy -an unusual or allergic reaction to fluorouracil, other chemotherapy, other medicines, foods, dyes, or preservatives -pregnant or trying to get pregnant -breast-feeding How should I use this medicine? This drug is given as an infusion or injection into a vein. It is administered in a hospital or clinic by a specially trained health care professional. Talk to your pediatrician regarding the use of this medicine in children. Special care may be needed. Overdosage: If you think you have taken too much of this  medicine contact a poison control center or emergency room at once. NOTE: This medicine is only for you. Do not share this medicine with others. What if I miss a dose? It is important not to miss your dose. Call your doctor or health care professional if you are unable to keep an appointment. What may interact with this medicine? -allopurinol -cimetidine -dapsone -digoxin -hydroxyurea -leucovorin -levamisole -medicines for seizures like ethotoin, fosphenytoin, phenytoin -medicines to increase blood counts like filgrastim, pegfilgrastim, sargramostim -medicines that treat or prevent blood clots like warfarin, enoxaparin, and dalteparin -methotrexate -metronidazole -pyrimethamine -some other chemotherapy drugs like busulfan, cisplatin, estramustine, vinblastine -trimethoprim -trimetrexate -vaccines Talk to your doctor or health care professional before taking any of these medicines: -acetaminophen -aspirin -ibuprofen -ketoprofen -naproxen This list may not describe all possible interactions. Give your health care provider a list of all the medicines, herbs, non-prescription drugs, or dietary supplements you use. Also tell them if you smoke, drink alcohol, or use illegal drugs. Some items may interact with your medicine. What should I watch for while using this medicine? Visit your doctor for checks on your progress. This drug may make you feel generally unwell. This is not uncommon, as chemotherapy can affect healthy cells as well as cancer cells. Report any side effects. Continue your course of treatment even though you feel ill unless your doctor tells you to stop. In some cases, you may be given additional medicines to help with side effects. Follow all directions for their use. Call your doctor or health care professional for advice if you get a fever, chills or sore throat, or other symptoms of a cold or flu. Do not treat yourself. This drug decreases your body's ability to fight  infections. Try to avoid being around people who are sick. This medicine may increase your risk to bruise or bleed. Call your doctor or health care professional if you notice any unusual bleeding. Be careful brushing and flossing your teeth or using a toothpick because you may get an infection or bleed more easily. If you have any dental work done, tell your dentist you are receiving this medicine. Avoid taking products that contain aspirin, acetaminophen, ibuprofen, naproxen, or ketoprofen unless instructed by your doctor. These medicines may hide a fever. Do not become pregnant while taking this medicine. Women should inform their doctor if they wish to become pregnant or think they might be pregnant. There is a potential for serious side effects to an unborn child. Talk to your health care professional or pharmacist for more information. Do not breast-feed an infant while taking this medicine. Men should inform their doctor if they wish to father a child. This medicine may lower sperm counts. Do not treat diarrhea with over the counter products. Contact your doctor if you have diarrhea that lasts more than 2 days or if it is severe and watery. This medicine can make you more sensitive to the sun. Keep out of the sun. If you cannot avoid being  in the sun, wear protective clothing and use sunscreen. Do not use sun lamps or tanning beds/booths. What side effects may I notice from receiving this medicine? Side effects that you should report to your doctor or health care professional as soon as possible: -allergic reactions like skin rash, itching or hives, swelling of the face, lips, or tongue -low blood counts - this medicine may decrease the number of white blood cells, red blood cells and platelets. You may be at increased risk for infections and bleeding. -signs of infection - fever or chills, cough, sore throat, pain or difficulty passing urine -signs of decreased platelets or bleeding - bruising,  pinpoint red spots on the skin, black, tarry stools, blood in the urine -signs of decreased red blood cells - unusually weak or tired, fainting spells, lightheadedness -breathing problems -changes in vision -chest pain -mouth sores -nausea and vomiting -pain, swelling, redness at site where injected -pain, tingling, numbness in the hands or feet -redness, swelling, or sores on hands or feet -stomach pain -unusual bleeding Side effects that usually do not require medical attention (report to your doctor or health care professional if they continue or are bothersome): -changes in finger or toe nails -diarrhea -dry or itchy skin -hair loss -headache -loss of appetite -sensitivity of eyes to the light -stomach upset -unusually teary eyes This list may not describe all possible side effects. Call your doctor for medical advice about side effects. You may report side effects to FDA at 1-800-FDA-1088. Where should I keep my medicine? This drug is given in a hospital or clinic and will not be stored at home. NOTE: This sheet is a summary. It may not cover all possible information. If you have questions about this medicine, talk to your doctor, pharmacist, or health care provider.  2019 Elsevier/Gold Standard (2007-05-01 13:53:16)

## 2018-02-23 ENCOUNTER — Inpatient Hospital Stay: Payer: PPO

## 2018-02-23 VITALS — BP 129/77 | HR 93 | Temp 98.4°F | Resp 18

## 2018-02-23 DIAGNOSIS — C20 Malignant neoplasm of rectum: Secondary | ICD-10-CM

## 2018-02-23 DIAGNOSIS — Z5111 Encounter for antineoplastic chemotherapy: Secondary | ICD-10-CM | POA: Diagnosis not present

## 2018-02-23 MED ORDER — HEPARIN SOD (PORK) LOCK FLUSH 100 UNIT/ML IV SOLN
500.0000 [IU] | Freq: Once | INTRAVENOUS | Status: AC | PRN
  Administered 2018-02-23: 500 [IU]
  Filled 2018-02-23: qty 5

## 2018-02-23 MED ORDER — SODIUM CHLORIDE 0.9% FLUSH
10.0000 mL | INTRAVENOUS | Status: DC | PRN
  Administered 2018-02-23: 10 mL
  Filled 2018-02-23: qty 10

## 2018-02-25 ENCOUNTER — Telehealth: Payer: Self-pay

## 2018-02-25 NOTE — Telephone Encounter (Signed)
  Oncology Nurse Navigator Documentation   Called patient to assess for navigation needs and to offer support. Patient states, "I'm feeling better than I thought I would." We reviewed use of nausea medications. I encouraged patient to call for with questions or concerns.

## 2018-02-26 ENCOUNTER — Ambulatory Visit: Payer: PPO | Admitting: Emergency Medicine

## 2018-02-28 ENCOUNTER — Encounter: Payer: Self-pay | Admitting: *Deleted

## 2018-02-28 NOTE — Progress Notes (Signed)
Faxed notification from [facility] that specimen from case #[id number] is insufficient for analysis. Must have at least 20% tumor content for testing. Dr. Benay Spice notified.

## 2018-03-03 ENCOUNTER — Other Ambulatory Visit: Payer: Self-pay | Admitting: Oncology

## 2018-03-06 ENCOUNTER — Encounter (HOSPITAL_COMMUNITY): Payer: Self-pay | Admitting: Oncology

## 2018-03-07 ENCOUNTER — Inpatient Hospital Stay: Payer: PPO

## 2018-03-07 ENCOUNTER — Telehealth: Payer: Self-pay | Admitting: Nurse Practitioner

## 2018-03-07 ENCOUNTER — Inpatient Hospital Stay (HOSPITAL_BASED_OUTPATIENT_CLINIC_OR_DEPARTMENT_OTHER): Payer: PPO | Admitting: Nurse Practitioner

## 2018-03-07 ENCOUNTER — Encounter: Payer: Self-pay | Admitting: Nurse Practitioner

## 2018-03-07 VITALS — BP 112/71 | HR 102 | Temp 98.1°F | Resp 20 | Ht 67.0 in | Wt 186.9 lb

## 2018-03-07 DIAGNOSIS — K219 Gastro-esophageal reflux disease without esophagitis: Secondary | ICD-10-CM

## 2018-03-07 DIAGNOSIS — Z95828 Presence of other vascular implants and grafts: Secondary | ICD-10-CM

## 2018-03-07 DIAGNOSIS — N200 Calculus of kidney: Secondary | ICD-10-CM | POA: Diagnosis not present

## 2018-03-07 DIAGNOSIS — N400 Enlarged prostate without lower urinary tract symptoms: Secondary | ICD-10-CM | POA: Diagnosis not present

## 2018-03-07 DIAGNOSIS — R590 Localized enlarged lymph nodes: Secondary | ICD-10-CM | POA: Diagnosis not present

## 2018-03-07 DIAGNOSIS — C20 Malignant neoplasm of rectum: Secondary | ICD-10-CM | POA: Diagnosis not present

## 2018-03-07 DIAGNOSIS — J8410 Pulmonary fibrosis, unspecified: Secondary | ICD-10-CM

## 2018-03-07 DIAGNOSIS — D6959 Other secondary thrombocytopenia: Secondary | ICD-10-CM

## 2018-03-07 DIAGNOSIS — R918 Other nonspecific abnormal finding of lung field: Secondary | ICD-10-CM

## 2018-03-07 DIAGNOSIS — Z5111 Encounter for antineoplastic chemotherapy: Secondary | ICD-10-CM | POA: Diagnosis not present

## 2018-03-07 LAB — CBC WITH DIFFERENTIAL (CANCER CENTER ONLY)
Abs Immature Granulocytes: 0 10*3/uL (ref 0.00–0.07)
Basophils Absolute: 0 10*3/uL (ref 0.0–0.1)
Basophils Relative: 1 %
Eosinophils Absolute: 0.1 10*3/uL (ref 0.0–0.5)
Eosinophils Relative: 4 %
HCT: 40.1 % (ref 39.0–52.0)
HEMOGLOBIN: 13.1 g/dL (ref 13.0–17.0)
Immature Granulocytes: 0 %
LYMPHS PCT: 47 %
Lymphs Abs: 1.3 10*3/uL (ref 0.7–4.0)
MCH: 30.3 pg (ref 26.0–34.0)
MCHC: 32.7 g/dL (ref 30.0–36.0)
MCV: 92.6 fL (ref 80.0–100.0)
Monocytes Absolute: 0.3 10*3/uL (ref 0.1–1.0)
Monocytes Relative: 9 %
Neutro Abs: 1.1 10*3/uL — ABNORMAL LOW (ref 1.7–7.7)
Neutrophils Relative %: 39 %
Platelet Count: 100 10*3/uL — ABNORMAL LOW (ref 150–400)
RBC: 4.33 MIL/uL (ref 4.22–5.81)
RDW: 13.2 % (ref 11.5–15.5)
WBC Count: 2.7 10*3/uL — ABNORMAL LOW (ref 4.0–10.5)
nRBC: 0 % (ref 0.0–0.2)

## 2018-03-07 LAB — CMP (CANCER CENTER ONLY)
ALT: 37 U/L (ref 0–44)
AST: 33 U/L (ref 15–41)
Albumin: 3.3 g/dL — ABNORMAL LOW (ref 3.5–5.0)
Alkaline Phosphatase: 61 U/L (ref 38–126)
Anion gap: 11 (ref 5–15)
BUN: 13 mg/dL (ref 8–23)
CO2: 23 mmol/L (ref 22–32)
Calcium: 8.9 mg/dL (ref 8.9–10.3)
Chloride: 107 mmol/L (ref 98–111)
Creatinine: 1.34 mg/dL — ABNORMAL HIGH (ref 0.61–1.24)
GFR, Est AFR Am: >60 mL/min (ref >60)
GFR, Estimated: 53 mL/min — ABNORMAL LOW (ref >60)
Glucose, Bld: 165 mg/dL — ABNORMAL HIGH (ref 70–99)
Potassium: 3.7 mmol/L (ref 3.5–5.1)
SODIUM: 141 mmol/L (ref 135–145)
Total Bilirubin: 0.3 mg/dL (ref 0.3–1.2)
Total Protein: 7.1 g/dL (ref 6.5–8.1)

## 2018-03-07 MED ORDER — SODIUM CHLORIDE 0.9% FLUSH
10.0000 mL | INTRAVENOUS | Status: DC | PRN
  Administered 2018-03-07: 10 mL via INTRAVENOUS
  Filled 2018-03-07: qty 10

## 2018-03-07 MED ORDER — HEPARIN SOD (PORK) LOCK FLUSH 100 UNIT/ML IV SOLN
500.0000 [IU] | Freq: Once | INTRAVENOUS | Status: AC
  Administered 2018-03-07: 500 [IU] via INTRAVENOUS
  Filled 2018-03-07: qty 5

## 2018-03-07 NOTE — Progress Notes (Addendum)
[facility] [place] OFFICE PROGRESS NOTE   Diagnosis: Rectal cancer  INTERVAL HISTORY:   Austin Yu returns as scheduled.  He completed cycle 1 FOLFOX 02/21/2018.  He denies nausea/vomiting.  He had a single sore on his lip.  No diarrhea.  He did not experience significant cold sensitivity.  He had jaw pain for 2 to 3 days after treatment.  No bleeding.  No shortness of breath.  No fever.  Objective:  Vital signs in last 24 hours:  Blood pressure 112/71, pulse (!) 102, temperature 98.1 F (36.7 C), temperature source Oral, resp. rate 20, height 5\' 7"  (1.702 m), weight 186 lb 14.4 oz (84.8 kg), SpO2 94 %.    HEENT: Small healing ulceration right lower lip. Resp: Rales at both lung bases.  No respiratory distress. Cardio: Regular rate and rhythm. GI: Abdomen soft and nontender.  No hepatomegaly. Vascular: No leg edema. Port-A-Cath without erythema.  Lab Results:  Lab Results  Component Value Date   WBC 2.7 (L) 03/07/2018   HGB 13.1 03/07/2018   HCT 40.1 03/07/2018   MCV 92.6 03/07/2018   PLT 100 (L) 03/07/2018   NEUTROABS 1.1 (L) 03/07/2018    Imaging:  No results found.  Medications: I have reviewed the patient's current medications.  Assessment/Plan: 1. Rectal cancer ? Distal rectal mass noted on colonoscopy 01/21/2018, biopsy confirmed invasive adenocarcinoma, cytokeratin 20 (patchy) and CDX 2+, cytokeratin 7 and TTF-1 negative ? CTs 01/29/2018- new left infrahilar mass, enlarged left hilar lymph node, mild subcarinal lymphadenopathy, confluent ill-defined lesion in area fibrosis at the posterior left lower lobe measuring 2 x 1.5 cm, rectal mass, perirectal lymph nodes ? MRI 01/28/2018-T3bN1tumor located at 6.6 cm from the internal anal sphincter, 2 mesorectal lymph nodes ? Hypermetabolic left infrahilar mass.  Hypermetabolic nodule left lower lobe.  Focal hypermetabolic activity within the rectum. ? Bronchoscopy 02/07/2018- no endobronchial lesions or abnormal  secretions seen.  Transbronchial brushings obtained from the left lower lobe nodule.  Significant enlargement of the station 7, station 10 L, station 11 L nodes.  Needle biopsies were taken from station 7 and 11 L nodes. ? Pathology from the left lower lobe brushings, a level 7 lymph node returned positive for adenocarcinoma, positive for cytokeratin 7 and CDX2, negative for cytokeratin 20 and TTF-1. ? Cycle 1 FOLFOX 02/21/2018 2. Pulmonary fibrosis 3. Kidney stones 4. BPH 5. History of gastroesophageal reflux 6. Mild renal insufficiency 7. Port-A-Cath placement 02/14/2018 8. Neutropenia and thrombocytopenia secondary to chemotherapy, Udenyca added with cycle 2 and oxaliplatin dose reduced   Disposition: [name] appears stable.  He has completed 1 cycle of FOLFOX.  He tolerated well.  We reviewed the CBC from today.  He has neutropenia and thrombocytopenia.  We are holding today's treatment and rescheduling for 1 week.  The oxaliplatin will be dose reduced with cycle 2.  He will receive Udenyca on the day of pump discontinuation.  We reviewed potential toxicities associated with Udenyca including bone pain, rash, splenic rupture.  He agrees to proceed.  We reviewed neutropenic and thrombocytopenic precautions.  He will call with fever, chills, other signs of infection, bleeding.  He will return for lab, follow-up and cycle 2 FOLFOX in 1 week.  He will contact the office in the interim as outlined above or with any other problems.  Patient seen with Dr. Benay Spice.  25 minutes were spent face-to-face at today's visit with the majority of that time involved in counseling/coordination of care.    [name] ANP/GNP-BC  03/07/2018  9:21 AM This was a shared visit with [name].  [name] tolerated the first cycle of FOLFOX well.  He has developed neutropenia and thrombocytopenia.  Chemotherapy will be held today.  Oxaliplatin will be dose reduced with cycle 2 and Udenyca will be added.  I  presented his case at the GI tumor conference yesterday.  It remains unclear whether the lung mass and chest adenopathy is related to metastatic rectal cancer or a primary lung tumor.  There are conflicting data.  The plan is to schedule a restaging CT of the chest after 4-5 cycles of FOLFOX.  He understands the differential diagnosis and agrees to proceed with FOLFOX.  Julieanne Manson, MD

## 2018-03-07 NOTE — Telephone Encounter (Signed)
Gave avs and calendar. Advised will call if/when 3/5 chemo treatment approved

## 2018-03-08 ENCOUNTER — Ambulatory Visit: Payer: PPO | Admitting: Oncology

## 2018-03-09 ENCOUNTER — Inpatient Hospital Stay: Payer: PPO

## 2018-03-09 ENCOUNTER — Telehealth: Payer: Self-pay | Admitting: Nurse Practitioner

## 2018-03-09 NOTE — Telephone Encounter (Signed)
Called patient to inform of approved chemo appt for 3/3. Spoke with patient. Confirmed date and time.

## 2018-03-12 ENCOUNTER — Encounter: Payer: Self-pay | Admitting: Nurse Practitioner

## 2018-03-12 ENCOUNTER — Inpatient Hospital Stay: Payer: PPO

## 2018-03-12 ENCOUNTER — Other Ambulatory Visit: Payer: Self-pay | Admitting: *Deleted

## 2018-03-12 ENCOUNTER — Inpatient Hospital Stay: Payer: PPO | Attending: Nurse Practitioner | Admitting: Nurse Practitioner

## 2018-03-12 VITALS — BP 108/75 | HR 97 | Temp 97.6°F | Resp 18 | Ht 67.0 in | Wt 190.1 lb

## 2018-03-12 DIAGNOSIS — Z5189 Encounter for other specified aftercare: Secondary | ICD-10-CM | POA: Insufficient documentation

## 2018-03-12 DIAGNOSIS — N400 Enlarged prostate without lower urinary tract symptoms: Secondary | ICD-10-CM | POA: Insufficient documentation

## 2018-03-12 DIAGNOSIS — R000 Tachycardia, unspecified: Secondary | ICD-10-CM | POA: Insufficient documentation

## 2018-03-12 DIAGNOSIS — J8410 Pulmonary fibrosis, unspecified: Secondary | ICD-10-CM

## 2018-03-12 DIAGNOSIS — N200 Calculus of kidney: Secondary | ICD-10-CM | POA: Diagnosis not present

## 2018-03-12 DIAGNOSIS — T451X5A Adverse effect of antineoplastic and immunosuppressive drugs, initial encounter: Secondary | ICD-10-CM

## 2018-03-12 DIAGNOSIS — C20 Malignant neoplasm of rectum: Secondary | ICD-10-CM | POA: Diagnosis not present

## 2018-03-12 DIAGNOSIS — N289 Disorder of kidney and ureter, unspecified: Secondary | ICD-10-CM | POA: Diagnosis not present

## 2018-03-12 DIAGNOSIS — Z95828 Presence of other vascular implants and grafts: Secondary | ICD-10-CM

## 2018-03-12 DIAGNOSIS — D701 Agranulocytosis secondary to cancer chemotherapy: Secondary | ICD-10-CM

## 2018-03-12 DIAGNOSIS — Z5111 Encounter for antineoplastic chemotherapy: Secondary | ICD-10-CM | POA: Insufficient documentation

## 2018-03-12 DIAGNOSIS — D6959 Other secondary thrombocytopenia: Secondary | ICD-10-CM

## 2018-03-12 LAB — CBC WITH DIFFERENTIAL (CANCER CENTER ONLY)
Abs Immature Granulocytes: 0.01 10*3/uL (ref 0.00–0.07)
Basophils Absolute: 0.1 10*3/uL (ref 0.0–0.1)
Basophils Relative: 1 %
Eosinophils Absolute: 0.1 10*3/uL (ref 0.0–0.5)
Eosinophils Relative: 3 %
HCT: 49.7 % (ref 39.0–52.0)
Hemoglobin: 16.2 g/dL (ref 13.0–17.0)
Immature Granulocytes: 0 %
LYMPHS ABS: 2 10*3/uL (ref 0.7–4.0)
Lymphocytes Relative: 47 %
MCH: 29.9 pg (ref 26.0–34.0)
MCHC: 32.6 g/dL (ref 30.0–36.0)
MCV: 91.9 fL (ref 80.0–100.0)
Monocytes Absolute: 0.6 10*3/uL (ref 0.1–1.0)
Monocytes Relative: 13 %
Neutro Abs: 1.6 10*3/uL — ABNORMAL LOW (ref 1.7–7.7)
Neutrophils Relative %: 36 %
Platelet Count: 180 10*3/uL (ref 150–400)
RBC: 5.41 MIL/uL (ref 4.22–5.81)
RDW: 13.6 % (ref 11.5–15.5)
WBC Count: 4.3 10*3/uL (ref 4.0–10.5)
nRBC: 0 % (ref 0.0–0.2)

## 2018-03-12 LAB — CMP (CANCER CENTER ONLY)
ALBUMIN: 3.1 g/dL — AB (ref 3.5–5.0)
ALT: 30 U/L (ref 0–44)
AST: 27 U/L (ref 15–41)
Alkaline Phosphatase: 62 U/L (ref 38–126)
Anion gap: 10 (ref 5–15)
BUN: 14 mg/dL (ref 8–23)
CO2: 25 mmol/L (ref 22–32)
Calcium: 9 mg/dL (ref 8.9–10.3)
Chloride: 106 mmol/L (ref 98–111)
Creatinine: 1.42 mg/dL — ABNORMAL HIGH (ref 0.61–1.24)
GFR, Est AFR Am: 57 mL/min — ABNORMAL LOW (ref >60)
GFR, Estimated: 49 mL/min — ABNORMAL LOW (ref >60)
GLUCOSE: 158 mg/dL — AB (ref 70–99)
Potassium: 4.1 mmol/L (ref 3.5–5.1)
SODIUM: 141 mmol/L (ref 135–145)
Total Bilirubin: 0.3 mg/dL (ref 0.3–1.2)
Total Protein: 7.2 g/dL (ref 6.5–8.1)

## 2018-03-12 MED ORDER — SODIUM CHLORIDE 0.9% FLUSH
10.0000 mL | INTRAVENOUS | Status: DC | PRN
  Administered 2018-03-12: 10 mL via INTRAVENOUS
  Filled 2018-03-12: qty 10

## 2018-03-12 NOTE — Progress Notes (Addendum)
[name] OFFICE PROGRESS NOTE   Diagnosis: Rectal cancer  INTERVAL HISTORY:   Austin Yu returns as scheduled.  He completed cycle 1 FOLFOX 02/21/2018.  Cycle 2 was held on 03/07/2018 due to neutropenia and thrombocytopenia.  He denies nausea/vomiting.  He had a single lip sore which has resolved.  No diarrhea.  No hand or foot pain or redness.  No cold sensitivity at present.  Objective:  Vital signs in last 24 hours:  Blood pressure 108/75, pulse 97, temperature 97.6 F (36.4 C), temperature source Oral, resp. rate 18, height 5\' 7"  (1.702 m), weight 190 lb 1.6 oz (86.2 kg), SpO2 94 %.    HEENT: No thrush or ulcers. Resp: Lungs clear bilaterally. Cardio: Regular rate and rhythm. GI: Abdomen soft and nontender.  No hepatomegaly. Vascular: No leg edema. Port-A-Cath without erythema.  Lab Results:  Lab Results  Component Value Date   WBC 4.3 03/12/2018   HGB 16.2 03/12/2018   HCT 49.7 03/12/2018   MCV 91.9 03/12/2018   PLT 180 03/12/2018   NEUTROABS 1.6 (L) 03/12/2018    Imaging:  No results found.  Medications: I have reviewed the patient's current medications.  Assessment/Plan: 1. Rectal cancer ? Distal rectal mass noted on colonoscopy 01/21/2018, biopsy confirmed invasive adenocarcinoma, cytokeratin 20 (patchy) and CDX 2+, cytokeratin 7 and TTF-1 negative ? CTs 01/29/2018-new left infrahilar mass, enlarged left hilar lymph node, mild subcarinal lymphadenopathy, confluent ill-defined lesion in area fibrosis at the posterior left lower lobe measuring 2 x 1.5 cm, rectal mass, perirectal lymph nodes ? MRI 01/28/2018-T3bN1tumor located at 6.6 cm from the internal anal sphincter, 2 mesorectal lymph nodes ? Hypermetabolic left infrahilar mass. Hypermetabolic nodule left lower lobe. Focal hypermetabolic activity within the rectum. ? Bronchoscopy 02/07/2018- no endobronchial lesions or abnormal secretions seen. Transbronchial brushings obtained from the  left lower lobe nodule. Significant enlargement of the station 7, station 10 L, station 11 L nodes. Needle biopsies were taken from station 7 and 11 L nodes. ? Pathology from the left lower lobe brushings, a level 7 lymph node returned positive for adenocarcinoma, positive for cytokeratin 7 and CDX2, negative for cytokeratin 20 and TTF-1. ? Cycle 1 FOLFOX 02/21/2018 ? Cycle 2 FOLFOX 03/13/2018, Udenyca added 2. Pulmonary fibrosis 3. Kidney stones 4. BPH 5. History of gastroesophageal reflux 6. Mild renal insufficiency 7. Port-A-Cath placement 02/14/2018 8. Neutropenia and thrombocytopenia secondary to chemotherapy, Udenyca added with cycle 2 and oxaliplatin dose reduced   Disposition: Mr. Horn appears stable.  He has completed 1 cycle of FOLFOX.  Cycle 2 was held last week due to mild neutropenia and thrombocytopenia.  The neutrophil count and platelet count have improved.  The plan is to proceed with cycle 2 tomorrow as scheduled.  Oxaliplatin has been dose reduced.  He will receive Udenyca on the day of pump discontinuation.  We will contact pathology to request additional testing on both the lung and rectal biopsies.  He will return for lab, follow-up and cycle 3 FOLFOX in 2 weeks.  He will contact the office in the interim with any problems.  Patient seen with Dr. Benay Spice.  25 minutes were spent face-to-face at today's visit with the majority of that time involved in counseling/coordination of care.    Austin Yu ANP/GNP-BC   03/12/2018  9:44 AM  This was a shared visit with Austin Yu.  The neutrophil count has recovered.  He will complete a cycle of FOLFOX today with G-CSF support.  We continue to be unsure  whether he has 2 primary cancers versus metastatic rectal cancer.  I decided to place Avastin therapy on hold while we continue with additional testing.  I will contact pathology with a request to obtain molecular testing on both the lung and rectal biopsy material.  The rectal  biopsy was insufficient for Foundation 1 testing.  Julieanne Manson, MD

## 2018-03-13 ENCOUNTER — Inpatient Hospital Stay: Payer: PPO

## 2018-03-13 VITALS — BP 149/84 | HR 93 | Temp 97.6°F | Resp 18

## 2018-03-13 DIAGNOSIS — Z5111 Encounter for antineoplastic chemotherapy: Secondary | ICD-10-CM | POA: Diagnosis not present

## 2018-03-13 DIAGNOSIS — C20 Malignant neoplasm of rectum: Secondary | ICD-10-CM | POA: Diagnosis not present

## 2018-03-13 DIAGNOSIS — Z95828 Presence of other vascular implants and grafts: Secondary | ICD-10-CM

## 2018-03-13 MED ORDER — FLUOROURACIL CHEMO INJECTION 2.5 GM/50ML
400.0000 mg/m2 | Freq: Once | INTRAVENOUS | Status: AC
  Administered 2018-03-13: 800 mg via INTRAVENOUS
  Filled 2018-03-13: qty 16

## 2018-03-13 MED ORDER — PALONOSETRON HCL INJECTION 0.25 MG/5ML
INTRAVENOUS | Status: AC
  Filled 2018-03-13: qty 5

## 2018-03-13 MED ORDER — DEXTROSE 5 % IV SOLN
Freq: Once | INTRAVENOUS | Status: AC
  Administered 2018-03-13: 09:00:00 via INTRAVENOUS
  Filled 2018-03-13: qty 250

## 2018-03-13 MED ORDER — SODIUM CHLORIDE 0.9 % IV SOLN
2400.0000 mg/m2 | INTRAVENOUS | Status: DC
  Administered 2018-03-13: 4850 mg via INTRAVENOUS
  Filled 2018-03-13: qty 97

## 2018-03-13 MED ORDER — OXALIPLATIN CHEMO INJECTION 100 MG/20ML
65.0000 mg/m2 | Freq: Once | INTRAVENOUS | Status: AC
  Administered 2018-03-13: 130 mg via INTRAVENOUS
  Filled 2018-03-13: qty 20

## 2018-03-13 MED ORDER — DEXAMETHASONE SODIUM PHOSPHATE 10 MG/ML IJ SOLN
INTRAMUSCULAR | Status: AC
  Filled 2018-03-13: qty 1

## 2018-03-13 MED ORDER — PALONOSETRON HCL INJECTION 0.25 MG/5ML
0.2500 mg | Freq: Once | INTRAVENOUS | Status: AC
  Administered 2018-03-13: 0.25 mg via INTRAVENOUS

## 2018-03-13 MED ORDER — DEXAMETHASONE SODIUM PHOSPHATE 10 MG/ML IJ SOLN
10.0000 mg | Freq: Once | INTRAMUSCULAR | Status: AC
  Administered 2018-03-13: 10 mg via INTRAVENOUS

## 2018-03-13 MED ORDER — LEUCOVORIN CALCIUM INJECTION 350 MG
400.0000 mg/m2 | Freq: Once | INTRAVENOUS | Status: AC
  Administered 2018-03-13: 808 mg via INTRAVENOUS
  Filled 2018-03-13: qty 40.4

## 2018-03-15 ENCOUNTER — Inpatient Hospital Stay: Payer: PPO

## 2018-03-15 ENCOUNTER — Ambulatory Visit: Payer: PPO

## 2018-03-15 VITALS — BP 148/78 | HR 92 | Temp 98.2°F | Resp 17

## 2018-03-15 DIAGNOSIS — Z5111 Encounter for antineoplastic chemotherapy: Secondary | ICD-10-CM | POA: Diagnosis not present

## 2018-03-15 DIAGNOSIS — C20 Malignant neoplasm of rectum: Secondary | ICD-10-CM

## 2018-03-15 MED ORDER — HEPARIN SOD (PORK) LOCK FLUSH 100 UNIT/ML IV SOLN
500.0000 [IU] | Freq: Once | INTRAVENOUS | Status: AC | PRN
  Administered 2018-03-15: 500 [IU]
  Filled 2018-03-15: qty 5

## 2018-03-15 MED ORDER — PEGFILGRASTIM-CBQV 6 MG/0.6ML ~~LOC~~ SOSY
6.0000 mg | PREFILLED_SYRINGE | Freq: Once | SUBCUTANEOUS | Status: AC
  Administered 2018-03-15: 6 mg via SUBCUTANEOUS

## 2018-03-15 MED ORDER — SODIUM CHLORIDE 0.9% FLUSH
10.0000 mL | INTRAVENOUS | Status: DC | PRN
  Administered 2018-03-15: 10 mL
  Filled 2018-03-15: qty 10

## 2018-03-15 NOTE — Patient Instructions (Signed)
Pegfilgrastim injection  What is this medicine?  PEGFILGRASTIM (PEG fil gra stim) is a long-acting granulocyte colony-stimulating factor that stimulates the growth of neutrophils, a type of white blood cell important in the body's fight against infection. It is used to reduce the incidence of fever and infection in patients with certain types of cancer who are receiving chemotherapy that affects the bone marrow, and to increase survival after being exposed to high doses of radiation.  This medicine may be used for other purposes; ask your health care provider or pharmacist if you have questions.  COMMON BRAND NAME(S): Fulphila, Neulasta, UDENYCA  What should I tell my health care provider before I take this medicine?  They need to know if you have any of these conditions:  -kidney disease  -latex allergy  -ongoing radiation therapy  -sickle cell disease  -skin reactions to acrylic adhesives (On-Body Injector only)  -an unusual or allergic reaction to pegfilgrastim, filgrastim, other medicines, foods, dyes, or preservatives  -pregnant or trying to get pregnant  -breast-feeding  How should I use this medicine?  This medicine is for injection under the skin. If you get this medicine at home, you will be taught how to prepare and give the pre-filled syringe or how to use the On-body Injector. Refer to the patient Instructions for Use for detailed instructions. Use exactly as directed. Tell your healthcare provider immediately if you suspect that the On-body Injector may not have performed as intended or if you suspect the use of the On-body Injector resulted in a missed or partial dose.  It is important that you put your used needles and syringes in a special sharps container. Do not put them in a trash can. If you do not have a sharps container, call your pharmacist or healthcare provider to get one.  Talk to your pediatrician regarding the use of this medicine in children. While this drug may be prescribed for  selected conditions, precautions do apply.  Overdosage: If you think you have taken too much of this medicine contact a poison control center or emergency room at once.  NOTE: This medicine is only for you. Do not share this medicine with others.  What if I miss a dose?  It is important not to miss your dose. Call your doctor or health care professional if you miss your dose. If you miss a dose due to an On-body Injector failure or leakage, a new dose should be administered as soon as possible using a single prefilled syringe for manual use.  What may interact with this medicine?  Interactions have not been studied.  Give your health care provider a list of all the medicines, herbs, non-prescription drugs, or dietary supplements you use. Also tell them if you smoke, drink alcohol, or use illegal drugs. Some items may interact with your medicine.  This list may not describe all possible interactions. Give your health care provider a list of all the medicines, herbs, non-prescription drugs, or dietary supplements you use. Also tell them if you smoke, drink alcohol, or use illegal drugs. Some items may interact with your medicine.  What should I watch for while using this medicine?  You may need blood work done while you are taking this medicine.  If you are going to need a MRI, CT scan, or other procedure, tell your doctor that you are using this medicine (On-Body Injector only).  What side effects may I notice from receiving this medicine?  Side effects that you should report to   your doctor or health care professional as soon as possible:  -allergic reactions like skin rash, itching or hives, swelling of the face, lips, or tongue  -back pain  -dizziness  -fever  -pain, redness, or irritation at site where injected  -pinpoint red spots on the skin  -red or dark-brown urine  -shortness of breath or breathing problems  -stomach or side pain, or pain at the shoulder  -swelling  -tiredness  -trouble passing urine or  change in the amount of urine  Side effects that usually do not require medical attention (report to your doctor or health care professional if they continue or are bothersome):  -bone pain  -muscle pain  This list may not describe all possible side effects. Call your doctor for medical advice about side effects. You may report side effects to FDA at 1-800-FDA-1088.  Where should I keep my medicine?  Keep out of the reach of children.  If you are using this medicine at home, you will be instructed on how to store it. Throw away any unused medicine after the expiration date on the label.  NOTE: This sheet is a summary. It may not cover all possible information. If you have questions about this medicine, talk to your doctor, pharmacist, or health care provider.   2019 Elsevier/Gold Standard (2017-04-02 16:57:08)

## 2018-03-16 ENCOUNTER — Ambulatory Visit: Payer: PPO

## 2018-03-21 ENCOUNTER — Other Ambulatory Visit: Payer: PPO

## 2018-03-21 ENCOUNTER — Ambulatory Visit: Payer: PPO | Admitting: Oncology

## 2018-03-21 ENCOUNTER — Ambulatory Visit: Payer: PPO

## 2018-03-21 ENCOUNTER — Encounter: Payer: PPO | Admitting: Nutrition

## 2018-03-22 DIAGNOSIS — C20 Malignant neoplasm of rectum: Secondary | ICD-10-CM | POA: Diagnosis not present

## 2018-03-24 ENCOUNTER — Other Ambulatory Visit: Payer: Self-pay | Admitting: Oncology

## 2018-03-26 ENCOUNTER — Other Ambulatory Visit (HOSPITAL_COMMUNITY)
Admission: RE | Admit: 2018-03-26 | Discharge: 2018-03-26 | Disposition: A | Discharge status: Home / Self Care | Payer: PPO | Source: Ambulatory Visit | Attending: Emergency Medicine | Admitting: Emergency Medicine

## 2018-03-26 DIAGNOSIS — C20 Malignant neoplasm of rectum: Secondary | ICD-10-CM | POA: Insufficient documentation

## 2018-03-28 ENCOUNTER — Other Ambulatory Visit: Payer: Self-pay

## 2018-03-28 ENCOUNTER — Inpatient Hospital Stay: Payer: PPO

## 2018-03-28 ENCOUNTER — Inpatient Hospital Stay (HOSPITAL_BASED_OUTPATIENT_CLINIC_OR_DEPARTMENT_OTHER): Payer: PPO | Admitting: Nurse Practitioner

## 2018-03-28 ENCOUNTER — Encounter: Payer: Self-pay | Admitting: Oncology

## 2018-03-28 ENCOUNTER — Encounter: Payer: PPO | Admitting: Nutrition

## 2018-03-28 ENCOUNTER — Inpatient Hospital Stay: Payer: PPO | Admitting: Nutrition

## 2018-03-28 VITALS — BP 121/69 | HR 120 | Temp 98.0°F | Resp 20 | Ht 67.0 in | Wt 186.0 lb

## 2018-03-28 VITALS — BP 147/90 | HR 101 | Resp 20

## 2018-03-28 DIAGNOSIS — N400 Enlarged prostate without lower urinary tract symptoms: Secondary | ICD-10-CM | POA: Diagnosis not present

## 2018-03-28 DIAGNOSIS — J8410 Pulmonary fibrosis, unspecified: Secondary | ICD-10-CM

## 2018-03-28 DIAGNOSIS — R000 Tachycardia, unspecified: Secondary | ICD-10-CM

## 2018-03-28 DIAGNOSIS — N289 Disorder of kidney and ureter, unspecified: Secondary | ICD-10-CM | POA: Diagnosis not present

## 2018-03-28 DIAGNOSIS — C20 Malignant neoplasm of rectum: Secondary | ICD-10-CM | POA: Diagnosis not present

## 2018-03-28 DIAGNOSIS — Z95828 Presence of other vascular implants and grafts: Secondary | ICD-10-CM

## 2018-03-28 DIAGNOSIS — Z5111 Encounter for antineoplastic chemotherapy: Secondary | ICD-10-CM | POA: Diagnosis not present

## 2018-03-28 LAB — CBC WITH DIFFERENTIAL (CANCER CENTER ONLY)
Abs Immature Granulocytes: 1.24 10*3/uL — ABNORMAL HIGH (ref 0.00–0.07)
Basophils Absolute: 0.1 10*3/uL (ref 0.0–0.1)
Basophils Relative: 1 %
Eosinophils Absolute: 0 10*3/uL (ref 0.0–0.5)
Eosinophils Relative: 0 %
HCT: 49.4 % (ref 39.0–52.0)
Hemoglobin: 16.2 g/dL (ref 13.0–17.0)
IMMATURE GRANULOCYTES: 4 %
Lymphocytes Relative: 8 %
Lymphs Abs: 2.2 10*3/uL (ref 0.7–4.0)
MCH: 30.1 pg (ref 26.0–34.0)
MCHC: 32.8 g/dL (ref 30.0–36.0)
MCV: 91.7 fL (ref 80.0–100.0)
MONOS PCT: 8 %
Monocytes Absolute: 2.2 10*3/uL — ABNORMAL HIGH (ref 0.1–1.0)
Neutro Abs: 22.6 10*3/uL — ABNORMAL HIGH (ref 1.7–7.7)
Neutrophils Relative %: 79 %
Platelet Count: 111 10*3/uL — ABNORMAL LOW (ref 150–400)
RBC: 5.39 MIL/uL (ref 4.22–5.81)
RDW: 14.7 % (ref 11.5–15.5)
WBC Count: 28.4 10*3/uL — ABNORMAL HIGH (ref 4.0–10.5)
nRBC: 0.1 % (ref 0.0–0.2)

## 2018-03-28 LAB — CMP (CANCER CENTER ONLY)
ALT: 32 U/L (ref 0–44)
AST: 33 U/L (ref 15–41)
Albumin: 3.4 g/dL — ABNORMAL LOW (ref 3.5–5.0)
Alkaline Phosphatase: 153 U/L — ABNORMAL HIGH (ref 38–126)
Anion gap: 15 (ref 5–15)
BILIRUBIN TOTAL: 0.6 mg/dL (ref 0.3–1.2)
BUN: 11 mg/dL (ref 8–23)
CO2: 21 mmol/L — ABNORMAL LOW (ref 22–32)
Calcium: 8.9 mg/dL (ref 8.9–10.3)
Chloride: 104 mmol/L (ref 98–111)
Creatinine: 1.29 mg/dL — ABNORMAL HIGH (ref 0.61–1.24)
GFR, Est AFR Am: >60 mL/min (ref >60)
GFR, Estimated: 55 mL/min — ABNORMAL LOW (ref >60)
Glucose, Bld: 128 mg/dL — ABNORMAL HIGH (ref 70–99)
Potassium: 3.7 mmol/L (ref 3.5–5.1)
Sodium: 140 mmol/L (ref 135–145)
TOTAL PROTEIN: 7.5 g/dL (ref 6.5–8.1)

## 2018-03-28 MED ORDER — HEPARIN SOD (PORK) LOCK FLUSH 100 UNIT/ML IV SOLN
500.0000 [IU] | Freq: Once | INTRAVENOUS | Status: DC | PRN
  Filled 2018-03-28: qty 5

## 2018-03-28 MED ORDER — SODIUM CHLORIDE 0.9% FLUSH
10.0000 mL | INTRAVENOUS | Status: DC | PRN
  Administered 2018-03-28: 10 mL via INTRAVENOUS
  Filled 2018-03-28: qty 10

## 2018-03-28 MED ORDER — FLUOROURACIL CHEMO INJECTION 2.5 GM/50ML
400.0000 mg/m2 | Freq: Once | INTRAVENOUS | Status: AC
  Administered 2018-03-28: 800 mg via INTRAVENOUS
  Filled 2018-03-28: qty 16

## 2018-03-28 MED ORDER — PALONOSETRON HCL INJECTION 0.25 MG/5ML
INTRAVENOUS | Status: AC
  Filled 2018-03-28: qty 5

## 2018-03-28 MED ORDER — DEXTROSE 5 % IV SOLN
Freq: Once | INTRAVENOUS | Status: AC
  Administered 2018-03-28: 11:00:00 via INTRAVENOUS
  Filled 2018-03-28: qty 250

## 2018-03-28 MED ORDER — DEXAMETHASONE SODIUM PHOSPHATE 10 MG/ML IJ SOLN
INTRAMUSCULAR | Status: AC
  Filled 2018-03-28: qty 1

## 2018-03-28 MED ORDER — PALONOSETRON HCL INJECTION 0.25 MG/5ML
0.2500 mg | Freq: Once | INTRAVENOUS | Status: AC
  Administered 2018-03-28: 0.25 mg via INTRAVENOUS

## 2018-03-28 MED ORDER — LEUCOVORIN CALCIUM INJECTION 350 MG
400.0000 mg/m2 | Freq: Once | INTRAVENOUS | Status: AC
  Administered 2018-03-28: 808 mg via INTRAVENOUS
  Filled 2018-03-28: qty 40.4

## 2018-03-28 MED ORDER — OXALIPLATIN CHEMO INJECTION 100 MG/20ML
65.0000 mg/m2 | Freq: Once | INTRAVENOUS | Status: AC
  Administered 2018-03-28: 130 mg via INTRAVENOUS
  Filled 2018-03-28: qty 20

## 2018-03-28 MED ORDER — SODIUM CHLORIDE 0.9 % IV SOLN
INTRAVENOUS | Status: AC
  Administered 2018-03-28: 09:00:00 via INTRAVENOUS
  Filled 2018-03-28 (×2): qty 250

## 2018-03-28 MED ORDER — SODIUM CHLORIDE 0.9% FLUSH
10.0000 mL | INTRAVENOUS | Status: DC | PRN
  Filled 2018-03-28: qty 10

## 2018-03-28 MED ORDER — DEXAMETHASONE SODIUM PHOSPHATE 10 MG/ML IJ SOLN
10.0000 mg | Freq: Once | INTRAMUSCULAR | Status: AC
  Administered 2018-03-28: 10 mg via INTRAVENOUS

## 2018-03-28 MED ORDER — SODIUM CHLORIDE 0.9 % IV SOLN
2400.0000 mg/m2 | INTRAVENOUS | Status: DC
  Administered 2018-03-28: 4850 mg via INTRAVENOUS
  Filled 2018-03-28: qty 97

## 2018-03-28 NOTE — Progress Notes (Signed)
Nutrition follow-up completed with patient during infusion for metastatic rectal cancer. Weight documented as 186 pounds March 19 decreased from 189.6 pounds February 4. Noted glucose 128, creatinine 1.29, and albumin 3.4. Patient reports his appetite is good and he is eating well. Reports he feels dehydrated. Denies nutrition impact symptoms.  Nutrition diagnosis: Food and nutrition related knowledge deficit improved.  Intervention: Educated patient to continue adequate calories and protein for weight maintenance. Reviewed strategies for increasing fluid intake to avoid dehydration in the future. Questions were answered.  Teach back method used.  Monitoring, evaluation, goals: Patient will tolerate adequate calories protein and fluids to minimize weight loss.  Next visit: To be seen as needed.  Patient has my contact information for questions.  **Disclaimer: This note was dictated with voice recognition software. Similar sounding words can inadvertently be transcribed and this note may contain transcription errors which may not have been corrected upon publication of note.**

## 2018-03-28 NOTE — Progress Notes (Signed)
[place] OFFICE PROGRESS NOTE   Diagnosis: Rectal cancer  INTERVAL HISTORY:   Austin Yu returns as scheduled.  He completed cycle 2 FOLFOX 03/13/2018.  He received Udenyca on the day of pump discontinuation.  He denies nausea/vomiting.  No mouth sores.  No diarrhea.  No significant cold sensitivity.  No numbness or tingling in the hands or feet today.  No change in baseline dyspnea and cough.  No fever.  No chest pain.  No leg swelling or calf pain.  He did not experience bone pain after a Udenyca.  He did note a headache for 2 days.  He is concerned that he may be dehydrated.  No lightheadedness or dizziness.  He does not feel that he drinks enough.  Objective:  Vital signs in last 24 hours:  Blood pressure 121/69, pulse (!) 120, temperature 98 F (36.7 C), temperature source Oral, resp. rate 20, height 5\' 7"  (1.702 m), weight 186 lb (84.4 kg), SpO2 92 %.    HEENT: No thrush or ulcers.  Mucous membranes are moist. Resp: Rales at both lung bases left greater than right.  No respiratory distress. Cardio: Regular, tachycardic. GI: Abdomen soft and nontender.  No hepatomegaly. Vascular: No leg edema. Neuro: Alert and oriented. Skin: Skin turgor is normal. Port-A-Cath without erythema.   Lab Results:  Lab Results  Component Value Date   WBC 28.4 (H) 03/28/2018   HGB 16.2 03/28/2018   HCT 49.4 03/28/2018   MCV 91.7 03/28/2018   PLT 111 (L) 03/28/2018   NEUTROABS 22.6 (H) 03/28/2018    Imaging:  No results found.  Medications: I have reviewed the patient's current medications.  Assessment/Plan: 1. Rectal cancer ? Distal rectal mass noted on colonoscopy 01/21/2018, biopsy confirmed invasive adenocarcinoma, cytokeratin 20 (patchy) and CDX 2+, cytokeratin 7 and TTF-1 negative ? CTs 01/29/2018-new left infrahilar mass, enlarged left hilar lymph node, mild subcarinal lymphadenopathy, confluent ill-defined lesion in area fibrosis at the posterior left lower lobe  measuring 2 x 1.5 cm, rectal mass, perirectal lymph nodes ? MRI 01/28/2018-T3bN1tumor located at 6.6 cm from the internal anal sphincter, 2 mesorectal lymph nodes ? Hypermetabolic left infrahilar mass. Hypermetabolic nodule left lower lobe. Focal hypermetabolic activity within the rectum. ? Bronchoscopy 02/07/2018-no endobronchial lesions or abnormal secretions seen. Transbronchial brushings obtained from the left lower lobe nodule. Significant enlargement of the station 7, station 10 L, station 11 L nodes. Needle biopsies were taken from station 7 and 11 L nodes. ? Pathology from the left lower lobe brushings, a level 7 lymph node returned positive for adenocarcinoma, positive for cytokeratin 7 and CDX2, negative for cytokeratin 20 and TTF-1. ? Cycle 1 FOLFOX 02/21/2018 ? Cycle 2 FOLFOX 03/13/2018, Udenyca added 2. Pulmonary fibrosis 3. Kidney stones 4. BPH 5. History of gastroesophageal reflux 6. Mild renal insufficiency 7. Port-A-Cath placement 02/14/2018 8. Neutropenia and thrombocytopenia secondary to chemotherapy, Udenyca added with cycle 2 and oxaliplatin dose reduced   Disposition: Austin Yu appears stable.  He has completed 2 cycles of FOLFOX.  Overall he seems to be tolerating the chemotherapy well.    He is tachycardic on exam today.  This may be related to dehydration.  We will give him 500 cc of normal saline over 1 hour and repeat vital signs.  If improved the plan is to proceed with cycle 3 FOLFOX today as scheduled.  He will not receive Udenyca with this cycle due to the elevated white count which is likely due to previous Udenyca.  He will  return for lab, follow-up and the next cycle of FOLFOX in 2 weeks.  We will adjust accordingly pending repeat vitals as outlined above.  Patient seen with Dr. Benay Spice.  25 minutes were spent face-to-face at today's visit with the majority of that time involved in counseling/coordination of care.    [name] ANP/GNP-BC   03/28/2018   9:09 AM  This was a shared visit with [name].  Austin Yu was interviewed and examined.  He had tachycardia on exam.  This improved with intravenous hydration in the chemotherapy room.  We have a low clinical suspicion for a pulmonary embolism.  He will complete cycle 3 FOLFOX today.  We are waiting on molecular testing on the rectal and lung biopsy specimens.  Julieanne Manson, MD

## 2018-03-28 NOTE — Progress Notes (Signed)
Ok to treat per Jacobo Forest, NP. Pulse now 107 down from 120

## 2018-03-28 NOTE — Progress Notes (Signed)
Order for Udenyca discontinued for this cycle per [name], NP note.  Pts WBC elevated today. Kennith Center, Pharm.D., CPP 03/28/2018@10 :54 AM

## 2018-03-28 NOTE — Patient Instructions (Addendum)
Coronavirus (COVID-19) Are you at risk? ° °Are you at risk for the Coronavirus (COVID-19)? ° °To be considered HIGH RISK for Coronavirus (COVID-19), you have to meet the following criteria: ° °• Traveled to [place], [place], [place], [place] or [place]; or in the [place] to [place], [place], [place], or [place]; and have fever, cough, and shortness of breath within the last 2 weeks of travel OR °• Been in close contact with a person diagnosed with COVID-19 within the last 2 weeks and have fever, cough, and shortness of breath °• IF YOU DO NOT MEET THESE CRITERIA, YOU ARE CONSIDERED LOW RISK FOR COVID-19. ° °What to do if you are HIGH RISK for COVID-19? ° °• If you are having a medical emergency, call 911. °• Seek medical care right away. Before you go to a doctor’s office, urgent care or emergency department, call ahead and tell them about your recent travel, contact with someone diagnosed with COVID-19, and your symptoms. You should receive instructions from your physician’s office regarding next steps of care.  °• When you arrive at healthcare provider, tell the healthcare staff immediately you have returned from visiting [place], [place], [place], [place] or [place]; or traveled in the [place] to [place], [place], [place], or [place]; in the last two weeks or you have been in close contact with a person diagnosed with COVID-19 in the last 2 weeks.   °• Tell the health care staff about your symptoms: fever, cough and shortness of breath. °• After you have been seen by a medical provider, you will be either: °o Tested for (COVID-19) and discharged home on quarantine except to seek medical care if symptoms worsen, and asked to  °- Stay home and avoid contact with others until you get your results (4-5 days)  °- Avoid travel on public transportation if possible (such as bus, train, or airplane) or °o Sent to the Emergency Department by EMS for evaluation, COVID-19 testing, and possible  admission depending on your condition and test results. ° °What to do if you are LOW RISK for COVID-19? ° °Reduce your risk of any infection by using the same precautions used for avoiding the common cold or flu:  °• Wash your hands often with soap and warm water for at least 20 seconds.  If soap and water are not readily available, use an alcohol-based hand sanitizer with at least 60% alcohol.  °• If coughing or sneezing, cover your mouth and nose by coughing or sneezing into the elbow areas of your shirt or coat, into a tissue or into your sleeve (not your hands). °• Avoid shaking hands with others and consider head nods or verbal greetings only. °• Avoid touching your eyes, nose, or mouth with unwashed hands.  °• Avoid close contact with people who are sick. °• Avoid places or events with large numbers of people in one location, like concerts or sporting events. °• Carefully consider travel plans you have or are making. °• If you are planning any travel outside or inside the [place], visit the CDC’s Travelers’ Health webpage for the latest health notices. °• If you have some symptoms but not all symptoms, continue to monitor at home and seek medical attention if your symptoms worsen. °• If you are having a medical emergency, call 911. ° ° °ADDITIONAL HEALTHCARE OPTIONS FOR PATIENTS ° °[facility] / e-Visit: [email or web address] °        °[facility]: [phone] ° °[facility]: [phone]                   [facility]: [phone]    Dehydration, Adult  Dehydration is when there is not enough fluid or water in your body. This happens when you lose more fluids than you take in. Dehydration can range from mild to very bad. It should be treated right away to keep it from getting very bad. Symptoms of mild dehydration may include:  Thirst.  Dry lips.  Slightly dry mouth.  Dry, warm skin.  Dizziness. Symptoms  of moderate dehydration may include:  Very dry mouth.  Muscle cramps.  Dark pee (urine). Pee may be the color of tea.  Your body making less pee.  Your eyes making fewer tears.  Heartbeat that is uneven or faster than normal (palpitations).  Headache.  Light-headedness, especially when you stand up from sitting.  Fainting (syncope). Symptoms of very bad dehydration may include:  Changes in skin, such as: ? Cold and clammy skin. ? Blotchy (mottled) or pale skin. ? Skin that does not quickly return to normal after being lightly pinched and let go (poor skin turgor).  Changes in body fluids, such as: ? Feeling very thirsty. ? Your eyes making fewer tears. ? Not sweating when body temperature is high, such as in hot weather. ? Your body making very little pee.  Changes in vital signs, such as: ? Weak pulse. ? Pulse that is more than 100 beats a minute when you are sitting still. ? Fast breathing. ? Low blood pressure.  Other changes, such as: ? Sunken eyes. ? Cold hands and feet. ? Confusion. ? Lack of energy (lethargy). ? Trouble waking up from sleep. ? Short-term weight loss. ? Unconsciousness. Follow these instructions at home:   If told by your doctor, drink an ORS: ? Make an ORS by using instructions on the package. ? Start by drinking small amounts, about  cup (120 mL) every 5-10 minutes. ? Slowly drink more until you have had the amount that your doctor said to have.  Drink enough clear fluid to keep your pee clear or pale yellow. If you were told to drink an ORS, finish the ORS first, then start slowly drinking clear fluids. Drink fluids such as: ? Water. Do not drink only water by itself. Doing that can make the salt (sodium) level in your body get too low (hyponatremia). ? Ice chips. ? Fruit juice that you have added water to (diluted). ? Low-calorie sports drinks.  Avoid: ? Alcohol. ? Drinks that have a lot of sugar. These include high-calorie  sports drinks, fruit juice that does not have water added, and soda. ? Caffeine. ? Foods that are greasy or have a lot of fat or sugar.  Take over-the-counter and prescription medicines only as told by your doctor.  Do not take salt tablets. Doing that can make the salt level in your body get too high (hypernatremia).  Eat foods that have minerals (electrolytes). Examples include bananas, oranges, potatoes, tomatoes, and spinach.  Keep all follow-up visits as told by your doctor. This is important. Contact a doctor if:  You have belly (abdominal) pain that: ? Gets worse. ? Stays in one area (localizes).  You have a rash.  You have a stiff neck.  You get angry or annoyed more easily than normal (irritability).  You are more sleepy than normal.  You have a harder time waking up than normal.  You feel: ?  Weak. ? Dizzy. ? Very thirsty.  You have peed (urinated) only a small amount of very dark pee during 6-8 hours. Get help right away if:  You have symptoms of very bad dehydration.  You cannot drink fluids without throwing up (vomiting).  Your symptoms get worse with treatment.  You have a fever.  You have a very bad headache.  You are throwing up or having watery poop (diarrhea) and it: ? Gets worse. ? Does not go away.  You have blood or something green (bile) in your throw-up.  You have blood in your poop (stool). This may cause poop to look black and tarry.  You have not peed in 6-8 hours.  You pass out (faint).  Your heart rate when you are sitting still is more than 100 beats a minute.  You have trouble breathing. This information is not intended to replace advice given to you by your health care provider. Make sure you discuss any questions you have with your health care provider. Document Released: 10/22/2008 Document Revised: 07/16/2015 Document Reviewed: 02/19/2015 Elsevier Interactive Patient Education  2019 [employer]  Dehydration,  Adult  Dehydration is when there is not enough fluid or water in your body. This happens when you lose more fluids than you take in. Dehydration can range from mild to very bad. It should be treated right away to keep it from getting very bad. Symptoms of mild dehydration may include:  Thirst.  Dry lips.  Slightly dry mouth.  Dry, warm skin.  Dizziness. Symptoms of moderate dehydration may include:  Very dry mouth.  Muscle cramps.  Dark pee (urine). Pee may be the color of tea.  Your body making less pee.  Your eyes making fewer tears.  Heartbeat that is uneven or faster than normal (palpitations).  Headache.  Light-headedness, especially when you stand up from sitting.  Fainting (syncope). Symptoms of very bad dehydration may include:  Changes in skin, such as: ? Cold and clammy skin. ? Blotchy (mottled) or pale skin. ? Skin that does not quickly return to normal after being lightly pinched and let go (poor skin turgor).  Changes in body fluids, such as: ? Feeling very thirsty. ? Your eyes making fewer tears. ? Not sweating when body temperature is high, such as in hot weather. ? Your body making very little pee.  Changes in vital signs, such as: ? Weak pulse. ? Pulse that is more than 100 beats a minute when you are sitting still. ? Fast breathing. ? Low blood pressure.  Other changes, such as: ? Sunken eyes. ? Cold hands and feet. ? Confusion. ? Lack of energy (lethargy). ? Trouble waking up from sleep. ? Short-term weight loss. ? Unconsciousness. Follow these instructions at home:   If told by your doctor, drink an ORS: ? Make an ORS by using instructions on the package. ? Start by drinking small amounts, about  cup (120 mL) every 5-10 minutes. ? Slowly drink more until you have had the amount that your doctor said to have.  Drink enough clear fluid to keep your pee clear or pale yellow. If you were told to drink an ORS, finish the ORS first,  then start slowly drinking clear fluids. Drink fluids such as: ? Water. Do not drink only water by itself. Doing that can make the salt (sodium) level in your body get too low (hyponatremia). ? Ice chips. ? Fruit juice that you have added water to (diluted). ? Low-calorie sports drinks.  Avoid: ?  Alcohol. ? Drinks that have a lot of sugar. These include high-calorie sports drinks, fruit juice that does not have water added, and soda. ? Caffeine. ? Foods that are greasy or have a lot of fat or sugar.  Take over-the-counter and prescription medicines only as told by your doctor.  Do not take salt tablets. Doing that can make the salt level in your body get too high (hypernatremia).  Eat foods that have minerals (electrolytes). Examples include bananas, oranges, potatoes, tomatoes, and spinach.  Keep all follow-up visits as told by your doctor. This is important. Contact a doctor if:  You have belly (abdominal) pain that: ? Gets worse. ? Stays in one area (localizes).  You have a rash.  You have a stiff neck.  You get angry or annoyed more easily than normal (irritability).  You are more sleepy than normal.  You have a harder time waking up than normal.  You feel: ? Weak. ? Dizzy. ? Very thirsty.  You have peed (urinated) only a small amount of very dark pee during 6-8 hours. Get help right away if:  You have symptoms of very bad dehydration.  You cannot drink fluids without throwing up (vomiting).  Your symptoms get worse with treatment.  You have a fever.  You have a very bad headache.  You are throwing up or having watery poop (diarrhea) and it: ? Gets worse. ? Does not go away.  You have blood or something green (bile) in your throw-up.  You have blood in your poop (stool). This may cause poop to look black and tarry.  You have not peed in 6-8 hours.  You pass out (faint).  Your heart rate when you are sitting still is more than 100 beats a  minute.  You have trouble breathing. This information is not intended to replace advice given to you by your health care provider. Make sure you discuss any questions you have with your health care provider. Document Released: 10/22/2008 Document Revised: 07/16/2015 Document Reviewed: 02/19/2015 Elsevier Interactive Patient Education  2019 [place] (COVID-19) Are you at risk?  Are you at risk for the Coronavirus (COVID-19)?  To be considered HIGH RISK for Coronavirus (COVID-19), you have to meet the following criteria:   Traveled to [place], [place], [place], [place] or [place]; or in the [place] to [place], [place], [place], or [place]; and have fever, cough, and shortness of breath within the last 2 weeks of travel OR  Been in close contact with a person diagnosed with COVID-19 within the last 2 weeks and have fever, cough, and shortness of breath  IF YOU DO NOT MEET THESE CRITERIA, YOU ARE CONSIDERED LOW RISK FOR COVID-19.  What to do if you are HIGH RISK for COVID-19?   If you are having a medical emergency, call 911.  Seek medical care right away. Before you go to a doctors office, urgent care or emergency department, call ahead and tell them about your recent travel, contact with someone diagnosed with COVID-19, and your symptoms. You should receive instructions from your physicians office regarding next steps of care.   When you arrive at healthcare provider, tell the healthcare staff immediately you have returned from visiting [place], [place], [place], [place] or [place]; or traveled in the [place] to [place], [place], [place], or [place]; in the last two weeks or you have been in close contact with a person diagnosed with COVID-19 in the last 2 weeks.  Tell the health care staff about your symptoms: fever, cough and shortness of breath.  After you have been seen by a medical provider, you will be either: o Tested for (COVID-19)  and discharged home on quarantine except to seek medical care if symptoms worsen, and asked to  - Stay home and avoid contact with others until you get your results (4-5 days)  - Avoid travel on public transportation if possible (such as bus, train, or airplane) or o Sent to the Emergency Department by EMS for evaluation, COVID-19 testing, and possible admission depending on your condition and test results.  What to do if you are LOW RISK for COVID-19?  Reduce your risk of any infection by using the same precautions used for avoiding the common cold or flu:   Wash your hands often with soap and warm water for at least 20 seconds.  If soap and water are not readily available, use an alcohol-based hand sanitizer with at least 60% alcohol.   If coughing or sneezing, cover your mouth and nose by coughing or sneezing into the elbow areas of your shirt or coat, into a tissue or into your sleeve (not your hands).  Avoid shaking hands with others and consider head nods or verbal greetings only.  Avoid touching your eyes, nose, or mouth with unwashed hands.   Avoid close contact with people who are sick.  Avoid places or events with large numbers of people in one location, like concerts or sporting events.  Carefully consider travel plans you have or are making.  If you are planning any travel outside or inside the [place], visit the [facility] webpage for the latest health notices.  If you have some symptoms but not all symptoms, continue to monitor at home and seek medical attention if your symptoms worsen.  If you are having a medical emergency, call 911.   [facility] / e-Visit: [email or web address]         [facility]: [facility]: [phone]                   [facility]: [phone]

## 2018-03-29 ENCOUNTER — Other Ambulatory Visit: Payer: Self-pay | Admitting: Oncology

## 2018-03-30 ENCOUNTER — Inpatient Hospital Stay: Payer: PPO

## 2018-03-30 ENCOUNTER — Other Ambulatory Visit: Payer: Self-pay

## 2018-03-30 VITALS — BP 116/67 | HR 127 | Temp 100.4°F | Resp 21

## 2018-03-30 DIAGNOSIS — Z5111 Encounter for antineoplastic chemotherapy: Secondary | ICD-10-CM | POA: Diagnosis not present

## 2018-03-30 DIAGNOSIS — C20 Malignant neoplasm of rectum: Secondary | ICD-10-CM

## 2018-03-30 MED ORDER — SODIUM CHLORIDE 0.9% FLUSH
10.0000 mL | INTRAVENOUS | Status: DC | PRN
  Administered 2018-03-30: 10 mL
  Filled 2018-03-30: qty 10

## 2018-03-30 MED ORDER — HEPARIN SOD (PORK) LOCK FLUSH 100 UNIT/ML IV SOLN
500.0000 [IU] | Freq: Once | INTRAVENOUS | Status: AC | PRN
  Administered 2018-03-30: 500 [IU]
  Filled 2018-03-30: qty 5

## 2018-03-30 NOTE — Patient Instructions (Signed)

## 2018-03-30 NOTE — Progress Notes (Signed)
Pt arrived for 5-FU pump D/C. Vitals taken and pt tachycardic and had a low grade fever. Otherwise non-symptomatic. On-call MD (Dr. Alvy Bimler) called for further instructions. Per Dr. Alvy Bimler: Based on patient's previous lab work and vital signs, instructed to tell patient to drink lots of fluids, rest, and only take Tylenol OTC if fever persists and patient is symptomatic (sweaty, flushed, chills, etc). Instructions communicated to patient who verbalized understanding and agreement. Instructed patient to call Dr. Gearldine Shown office Monday morning with an update (especially if symptoms persist/worsen) Mask and beverage provided to patient who then ambulated out of clinic without incident.   Will send message to Dr. Benay Spice and his desk nurse with update and to check on patient Monday if he forgets to call.   Vitals:   03/30/18 1235  BP: 116/67  Pulse: (!) 127  Resp: (!) 21  Temp: (!) 100.4 F (38 C)  TempSrc: Oral  SpO2: 93%

## 2018-04-01 ENCOUNTER — Encounter (HOSPITAL_COMMUNITY): Payer: Self-pay | Admitting: *Deleted

## 2018-04-01 ENCOUNTER — Other Ambulatory Visit: Payer: Self-pay

## 2018-04-01 ENCOUNTER — Telehealth: Payer: Self-pay | Admitting: Nurse Practitioner

## 2018-04-01 ENCOUNTER — Emergency Department (HOSPITAL_COMMUNITY): Payer: PPO

## 2018-04-01 ENCOUNTER — Other Ambulatory Visit (HOSPITAL_COMMUNITY)
Admission: RE | Admit: 2018-04-01 | Discharge: 2018-04-01 | Disposition: A | Discharge status: Home / Self Care | Payer: PPO | Source: Ambulatory Visit | Attending: Gastroenterology | Admitting: Gastroenterology

## 2018-04-01 ENCOUNTER — Emergency Department (HOSPITAL_COMMUNITY)
Admission: EM | Admit: 2018-04-01 | Discharge: 2018-04-02 | Disposition: A | Discharge status: Home / Self Care | Payer: PPO | Attending: Emergency Medicine | Admitting: Emergency Medicine

## 2018-04-01 DIAGNOSIS — R531 Weakness: Secondary | ICD-10-CM | POA: Diagnosis not present

## 2018-04-01 DIAGNOSIS — R0602 Shortness of breath: Secondary | ICD-10-CM | POA: Insufficient documentation

## 2018-04-01 DIAGNOSIS — Z03818 Encounter for observation for suspected exposure to other biological agents ruled out: Secondary | ICD-10-CM | POA: Diagnosis not present

## 2018-04-01 DIAGNOSIS — N200 Calculus of kidney: Secondary | ICD-10-CM | POA: Diagnosis not present

## 2018-04-01 DIAGNOSIS — Z85048 Personal history of other malignant neoplasm of rectum, rectosigmoid junction, and anus: Secondary | ICD-10-CM | POA: Insufficient documentation

## 2018-04-01 DIAGNOSIS — Z79899 Other long term (current) drug therapy: Secondary | ICD-10-CM | POA: Insufficient documentation

## 2018-04-01 DIAGNOSIS — R05 Cough: Secondary | ICD-10-CM | POA: Diagnosis not present

## 2018-04-01 DIAGNOSIS — N3001 Acute cystitis with hematuria: Secondary | ICD-10-CM | POA: Diagnosis not present

## 2018-04-01 DIAGNOSIS — R509 Fever, unspecified: Secondary | ICD-10-CM | POA: Diagnosis present

## 2018-04-01 LAB — CBC WITH DIFFERENTIAL/PLATELET
Abs Immature Granulocytes: 0.09 10*3/uL — ABNORMAL HIGH (ref 0.00–0.07)
BASOS PCT: 0 %
Basophils Absolute: 0.1 10*3/uL (ref 0.0–0.1)
Eosinophils Absolute: 0 10*3/uL (ref 0.0–0.5)
Eosinophils Relative: 0 %
HCT: 48.1 % (ref 39.0–52.0)
Hemoglobin: 15.9 g/dL (ref 13.0–17.0)
Immature Granulocytes: 1 %
Lymphocytes Relative: 8 %
Lymphs Abs: 1.1 10*3/uL (ref 0.7–4.0)
MCH: 30.4 pg (ref 26.0–34.0)
MCHC: 33.1 g/dL (ref 30.0–36.0)
MCV: 92 fL (ref 80.0–100.0)
Monocytes Absolute: 0.1 10*3/uL (ref 0.1–1.0)
Monocytes Relative: 1 %
Neutro Abs: 12.2 10*3/uL — ABNORMAL HIGH (ref 1.7–7.7)
Neutrophils Relative %: 90 %
PLATELETS: 122 10*3/uL — AB (ref 150–400)
RBC: 5.23 MIL/uL (ref 4.22–5.81)
RDW: 14.8 % (ref 11.5–15.5)
WBC: 13.6 10*3/uL — ABNORMAL HIGH (ref 4.0–10.5)
nRBC: 0 % (ref 0.0–0.2)

## 2018-04-01 LAB — COMPREHENSIVE METABOLIC PANEL
ALBUMIN: 3.2 g/dL — AB (ref 3.5–5.0)
ALT: 16 U/L (ref 0–44)
AST: 23 U/L (ref 15–41)
Alkaline Phosphatase: 88 U/L (ref 38–126)
Anion gap: 12 (ref 5–15)
BUN: 28 mg/dL — ABNORMAL HIGH (ref 8–23)
CO2: 20 mmol/L — AB (ref 22–32)
Calcium: 8.5 mg/dL — ABNORMAL LOW (ref 8.9–10.3)
Chloride: 100 mmol/L (ref 98–111)
Creatinine, Ser: 1.41 mg/dL — ABNORMAL HIGH (ref 0.61–1.24)
GFR calc Af Amer: 58 mL/min — ABNORMAL LOW (ref >60)
GFR calc non Af Amer: 50 mL/min — ABNORMAL LOW (ref >60)
Glucose, Bld: 127 mg/dL — ABNORMAL HIGH (ref 70–99)
Potassium: 3.9 mmol/L (ref 3.5–5.1)
Sodium: 132 mmol/L — ABNORMAL LOW (ref 135–145)
Total Bilirubin: 1.3 mg/dL — ABNORMAL HIGH (ref 0.3–1.2)
Total Protein: 7.6 g/dL (ref 6.5–8.1)

## 2018-04-01 LAB — LACTIC ACID, PLASMA: Lactic Acid, Venous: 1.3 mmol/L (ref 0.5–1.9)

## 2018-04-01 MED ORDER — SODIUM CHLORIDE 0.9 % IV BOLUS
1000.0000 mL | Freq: Once | INTRAVENOUS | Status: AC
  Administered 2018-04-02: 1000 mL via INTRAVENOUS

## 2018-04-01 MED ORDER — ACETAMINOPHEN 500 MG PO TABS
1000.0000 mg | ORAL_TABLET | Freq: Once | ORAL | Status: AC
  Administered 2018-04-02: 1000 mg via ORAL
  Filled 2018-04-01: qty 2

## 2018-04-01 NOTE — ED Provider Notes (Signed)
[facility] DEPT Provider Note   CSN: [id number] Arrival date & time: 04/01/18  2222    History   Chief Complaint Chief Complaint  Patient presents with   Fever    HPI Austin Yu is a 73 y.o. male with a history of rectal cancer, idiopathic postinflammatory pulmonary fibrosis, and nephrolithiasis who presents to the emergency department who presents to the emergency department with a chief complaint of fever.  The patient endorses fever, T-max at home 100.9, and generalized weakness since yesterday.  He reports associated cough and shortness of breath, but reports these are not worse than his baseline.  He denies abdominal pain, dizziness, headache, rash, nausea, vomiting, diarrhea, back pain, flank pain, sore throat, or urinary symptoms.  He reports he takes 2 tablets of Tylenol at 15:00. He is unsure of how many milligrams the tablets were.   He reports that he spoke with his oncologist yesterday after having a low-grade fever during an office visit on 3/21.  He was advised to stay home with home isolation for 14 days or until 3 consecutive days without fever without use of antipyretics.  He reports that he was told to go to the ER if his temperature worsened, but he cannot remember the number that he was told.  He reports a history of obstructive stones that have required surgical removal.  He reports some previous obstructive stones have been painless while others caused severe flank pain.  He denies recent travel.  No sick contacts.  No recent surgery or immobilization.  No history of DVT or PE.   Per chart review, last dose of Udenyca (neulasta) was 03/15/18.      The history is provided by the patient. No language interpreter was used.    Past Medical History:  Diagnosis Date   Abnormal chest sounds    BPH (benign prostatic hypertrophy)    Cancer (HCC)    rectum cancer; dx 01/21/2018 by Dr. Loletha Carrow by colonoscopy   Complication of  anesthesia    ISSUE W/ EXTUBATION 05-18-2015 IN [facility] -- REFER TO ANESTHESIA NOTE''S   Elevated PSA    GERD (gastroesophageal reflux disease)    History of airway aspiration    History of kidney stones    Hyperlipidemia    Hyperplasia of prostate without lower urinary tract symptoms (LUTS)    Left ureteral stone    Lung nodule    Nephrolithiasis    Postinflammatory pulmonary fibrosis (HCC)    PULMOLOGIST-  DR ZWCH-  IDIOPATHIC    Pulmonary fibrosis ([place])    Strawberry hemangioma of skin    Upper airway cough syndrome    PULMOLOGIST-  DR ENID    Patient Active Problem List   Diagnosis Date Noted   Rectal cancer ([place]) 02/12/2018   Goals of care, counseling/discussion 02/12/2018   Abnormal findings on diagnostic imaging of lung 02/04/2018   IPF (idiopathic pulmonary fibrosis) ([place]) 02/04/2018   Positive colorectal cancer screening using Cologuard test    Upper airway cough syndrome 02/02/2015   Postinflammatory pulmonary fibrosis ([place]) 02/01/2015    Past Surgical History:  Procedure Laterality Date   BIOPSY  01/21/2018   Procedure: BIOPSY;  Surgeon: Doran Stabler, MD;  Location: [facility];  Service: Gastroenterology;;   COLONOSCOPY WITH PROPOFOL N/A 01/21/2018   Procedure: COLONOSCOPY WITH PROPOFOL;  Surgeon: Doran Stabler, MD;  Location: [facility];  Service: Gastroenterology;  Laterality: N/A;   CYSTOSCOPY WITH HOLMIUM LASER LITHOTRIPSY Left 06/01/2015  Procedure: CYSTOSCOPY WITH HOLMIUM LASER LITHOTRIPSY;  Surgeon: Irine Seal, MD;  Location: [facility];  Service: Urology;  Laterality: Left;   CYSTOSCOPY WITH URETEROSCOPY, STONE BASKETRY AND STENT PLACEMENT Left 05/18/2015   Procedure: CYSTOSCOPY WITH STENT PLACEMENT AND RETROGRADE PYELOGRAM;  Surgeon: Irine Seal, MD;  Location: [facility];  Service: Urology;  Laterality: Left;   CYSTOSCOPY WITH URETEROSCOPY, STONE BASKETRY AND STENT PLACEMENT Left 06/01/2015   Procedure:  CYSTOSCOPY WITH URETEROSCOPY, STONE BASKETRY AND STENT PLACEMENT;  Surgeon: Irine Seal, MD;  Location[facility];  Service: Urology;  Laterality: Left;   EXTRACORPOREAL SHOCK WAVE LITHOTRIPSY  2007   IR IMAGING GUIDED PORT INSERTION  02/14/2018   TONSILLECTOMY  age 11-4   VIDEO BRONCHOSCOPY WITH ENDOBRONCHIAL NAVIGATION Left 02/07/2018   Procedure: VIDEO BRONCHOSCOPY WITH ENDOBRONCHIAL NAVIGATION;  Surgeon: Collene Gobble, MD;  Location: [facility];  Service: Thoracic;  Laterality: Left;   VIDEO BRONCHOSCOPY WITH ENDOBRONCHIAL ULTRASOUND Left 02/07/2018   Procedure: VIDEO BRONCHOSCOPY WITH ENDOBRONCHIAL ULTRASOUND;  Surgeon: Collene Gobble, MD;  Location: [facility];  Service: Thoracic;  Laterality: Left;        Home Medications    Prior to Admission medications   Medication Sig Start Date End Date Taking? Authorizing Provider  acetaminophen (TYLENOL) 325 MG tablet Take 650 mg by mouth every 6 (six) hours as needed for moderate pain.   Yes [provider]  famotidine (PEPCID) 20 MG tablet Take 1 tablet (20 mg total) by mouth at bedtime. 08/01/17  Yes Tanda Rockers, MD  lidocaine-prilocaine (EMLA) cream Apply to Port-A-Cath 1 to 2 hours prior to use 02/12/18  Yes Owens Shark, NP  pantoprazole (PROTONIX) 40 MG tablet TAKE 1 TABLET BY MOUTH EVERY MORNING( 30 TO 60 MINUTES BEFORE FIRST MEAL OF THE DAY) Patient taking differently: Take 40 mg by mouth daily.  10/01/17  Yes Tanda Rockers, MD  tamsulosin (FLOMAX) 0.4 MG CAPS capsule Take 0.4 mg by mouth at bedtime.  05/07/15  Yes [provider]  Nintedanib (OFEV) 150 MG CAPS Take 150 mg by mouth 2 (two) times daily.  12/14/16   [provider]  prochlorperazine (COMPAZINE) 10 MG tablet Take 1 tablet (10 mg total) by mouth every 6 (six) hours as needed for nausea or vomiting. 02/12/18   Owens Shark, NP    Family History Family History  Problem Relation Age of Onset   Heart failure Father        CHF    Healthy Brother    Healthy Daughter    Healthy Daughter     Social History Social History   Tobacco Use   Smoking status: Never Smoker   Smokeless tobacco: Never Used  Substance Use Topics   Alcohol use: Yes    Alcohol/week: 0.0 standard drinks    Comment: occasional   Drug use: No     Allergies   Patient has no known allergies.   Review of Systems Review of Systems  Constitutional: Positive for fever. Negative for appetite change and chills.  Respiratory: Positive for cough (chronic, not worse than baseline) and shortness of breath (chronic, not worse than baseline). Negative for wheezing.   Cardiovascular: Negative for chest pain, palpitations and leg swelling.  Gastrointestinal: Negative for abdominal pain, constipation, diarrhea, nausea and vomiting.  Genitourinary: Negative for discharge, dysuria, flank pain, frequency, hematuria, penile pain, scrotal swelling, testicular pain and urgency.  Musculoskeletal: Negative for arthralgias, back pain, myalgias, neck pain and neck stiffness.  Skin:  Negative for rash.  Allergic/Immunologic: Negative for immunocompromised state.  Neurological: Positive for weakness (generalized). Negative for dizziness, syncope, numbness and headaches.  Psychiatric/Behavioral: Negative for confusion.     Physical Exam Updated Vital Signs BP 101/82    Pulse 90    Temp 97.8 F (36.6 C) (Oral)    Resp 19    SpO2 92%   Physical Exam Vitals signs and nursing note reviewed.  Constitutional:      Appearance: He is well-developed. He is not ill-appearing or toxic-appearing.  HENT:     Head: Normocephalic.     Right Ear: Tympanic membrane, ear canal and external ear normal.     Left Ear: Tympanic membrane, ear canal and external ear normal.     Nose: Nose normal.     Mouth/Throat:     Mouth: Mucous membranes are moist.  Eyes:     Extraocular Movements: Extraocular movements intact.     Conjunctiva/sclera: Conjunctivae normal.      Pupils: Pupils are equal, round, and reactive to light.  Neck:     Musculoskeletal: Normal range of motion and neck supple. No neck rigidity or muscular tenderness.  Cardiovascular:     Rate and Rhythm: Regular rhythm. Tachycardia present.     Heart sounds: No murmur.  Pulmonary:     Effort: Pulmonary effort is normal.     Comments: Crackles in the left lung base.  Lungs are otherwise clear to auscultation bilaterally.  No accessory muscle use, retractions, or increased work of breathing. No tachypnea.  Abdominal:     General: There is no distension.     Palpations: Abdomen is soft. There is no mass.     Tenderness: There is no abdominal tenderness. There is no right CVA tenderness, left CVA tenderness, guarding or rebound.     Hernia: No hernia is present.     Comments: Abdomen is nontender.  No rebound or guarding.  Soft, nondistended.  No CVA tenderness bilaterally.  Musculoskeletal:        General: No tenderness.     Right lower leg: No edema.     Left lower leg: No edema.  Lymphadenopathy:     Cervical: No cervical adenopathy.  Skin:    General: Skin is warm and dry.  Neurological:     Mental Status: He is alert.  Psychiatric:        Behavior: Behavior normal.      ED Treatments / Results  Labs (all labs ordered are listed, but only abnormal results are displayed) Labs Reviewed  COMPREHENSIVE METABOLIC PANEL - Abnormal; Notable for the following components:      Result Value   Sodium 132 (*)    CO2 20 (*)    Glucose, Bld 127 (*)    BUN 28 (*)    Creatinine, Ser 1.41 (*)    Calcium 8.5 (*)    Albumin 3.2 (*)    Total Bilirubin 1.3 (*)    GFR calc non Af Amer 50 (*)    GFR calc Af Amer 58 (*)    All other components within normal limits  CBC WITH DIFFERENTIAL/PLATELET - Abnormal; Notable for the following components:   WBC 13.6 (*)    Platelets 122 (*)    Neutro Abs 12.2 (*)    Abs Immature Granulocytes 0.09 (*)    All other components within normal limits    URINALYSIS, ROUTINE W REFLEX MICROSCOPIC - Abnormal; Notable for the following components:   APPearance HAZY (*)  Hgb urine dipstick LARGE (*)    Protein, ur 30 (*)    Nitrite POSITIVE (*)    Leukocytes,Ua MODERATE (*)    WBC, UA >50 (*)    Bacteria, UA MANY (*)    All other components within normal limits  CULTURE, BLOOD (ROUTINE X 2)  CULTURE, BLOOD (ROUTINE X 2)  URINE CULTURE  LACTIC ACID, PLASMA  INFLUENZA PANEL BY PCR (TYPE A & B)    EKG EKG Interpretation  Date/Time:  Monday April 01 2018 22:55:10 EDT Ventricular Rate:  114 PR Interval:    QRS Duration: 93 QT Interval:  308 QTC Calculation: 425 R Axis:   -48 Text Interpretation:  Sinus tachycardia Left atrial enlargement LAD, consider left anterior fascicular block RSR' in V1 or V2, right VCD or RVH No old tracing to compare Confirmed by Daleen Bo [phone]) on 04/01/2018 11:00:13 PM   Radiology Dg Chest Portable 1 View  Result Date: 04/01/2018 CLINICAL DATA:  Cough and fever for 2 days. EXAM: PORTABLE CHEST 1 VIEW COMPARISON:  Radiograph 02/07/2018, chest CT 02/05/2018 FINDINGS: Right chest port with tip in the lower low lung volumes. Unchanged heart size and mediastinal contours. Right hilar prominence may be secondary to prominent mediastinal fat as seen on prior CT. Interstitial lung disease with basilar and peripheral predominance, most prominent on the left, unchanged from prior exam. SVC. Known left lower lobe lung nodule not seen radiographically. No acute airspace disease. No large pleural effusion or pneumothorax. IMPRESSION: Chronic lung disease without acute findings. Electronically Signed   By: Keith Rake M.D.   On: 04/01/2018 23:27   Ct Renal Stone Study  Result Date: 04/02/2018 CLINICAL DATA:  Hematuria. Fever for 2 days. Patient with history of rectal cancer and left lower lobe nodule. EXAM: CT ABDOMEN AND PELVIS WITHOUT CONTRAST TECHNIQUE: Multidetector CT imaging of the abdomen and pelvis was  performed following the standard protocol without IV contrast. COMPARISON:  Chest CT 02/05/2018, PET-CT 02/01/2018. Contrast-enhanced abdominal CT 01/29/2018 FINDINGS: Lower chest: Pulmonary fibrosis. Enlarged left hilar node is partially included, not well evaluated in the absence of IV contrast. Diffusely increased density in the posterior left lower lobe in the region of nodule those hypermetabolic on PET, nodule is no longer seen. There is associated bronchiectasis. Hepatobiliary: Multiple gallstones within partially distended gallbladder. No pericholecystic inflammation. Mild hepatic steatosis. No focal hepatic lesion on noncontrast exam. Pancreas: No ductal dilatation or inflammation. Spleen: Normal in size with lobular contours. Adrenals/Urinary Tract: No adrenal nodule. Multiple stones in the right kidney including large stones in the upper pole calyx measuring up to 17 mm. No hydronephrosis or ureteral calculi. Previous stone in the distal right ureter is no longer seen. Renal cysts better characterized on prior contrast-enhanced exam. Multiple stones throughout the left kidney, stone or clustered stones measuring 9 mm anteriorly in the interpolar region. No hydronephrosis or ureteral calculi. Renal cysts better characterized on prior contrast-enhanced exam. The urinary bladder is physiologically distended. No bladder wall thickening or bladder stone. No urethral stones visualized. Stomach/Bowel: Soft tissue density in the rectum corresponds to known malignancy. No obstruction or surrounding inflammation. Colonic diverticulosis most prominent in the distal colon without diverticulitis. Moderate colonic stool burden. Appendix not confidently visualized, no evidence appendicitis. Vascular/Lymphatic: Aortic atherosclerosis without aneurysm. Small perirectal node measuring up to 4 mm, unchanged allowing for differences in caliper placement. No new or progressive adenopathy. Reproductive: Enlarged prostate gland  spanning 5 cm. Other: No free air or ascites. Tiny fat containing umbilical hernia. Musculoskeletal:  There are no acute or suspicious osseous abnormalities. Prominent Schmorl's node superior endplate of L4 again seen. IMPRESSION: 1. Bilateral nonobstructing renal calculi. No hydronephrosis or obstructive uropathy. Previous distal right ureteral stone is no longer seen. 2. Soft tissue density in the rectum consistent with known malignancy. Small perirectal node is unchanged allowing for differences in technique. 3. Previous left lower lobe pulmonary nodule is not as well seen, there is diffuse increased parenchymal density in the left lower lobe in the region of nodule with associated bronchiectasis. This may be post treatment related change versus superimposed infection. Interstitial lung disease again seen. 4. Incidental findings of cholelithiasis, colonic diverticulosis, hepatic steatosis, and enlarged prostate gland. Electronically Signed   By: Keith Rake M.D.   On: 04/02/2018 03:41    Procedures Procedures (including critical care time)  Medications Ordered in ED Medications  acetaminophen (TYLENOL) tablet 1,000 mg (1,000 mg Oral Given 04/02/18 0016)  sodium chloride 0.9 % bolus 1,000 mL (0 mLs Intravenous Stopped 04/02/18 0135)  fosfomycin (MONUROL) packet 3 g (3 g Oral Given 04/02/18 0421)  heparin lock flush 100 unit/mL (500 Units Intracatheter Given 04/02/18 0429)     Initial Impression / Assessment and Plan / ED Course  I have reviewed the triage vital signs and the nursing notes.  Pertinent labs & imaging results that were available during my care of the patient were reviewed by me and considered in my medical decision making (see chart for details).        73 year old male history of rectal cancer, idiopathic postinflammatory pulmonary fibrosis, and nephrolithiasis presenting from home with fever and generalized weakness, onset yesterday.  He took 2 tablets of Tylenol at 1500.   He reports a history of a chronic cough and shortness of breath secondary to pulmonary fibrosis, but symptoms are not worse than baseline. The patient was seen and evaluated along with Dr. Eulis Foster, attending physician.   Rectal temp 103.3 on arrival with HR in the 120s.  Oxygen saturation of 83% was noted when the patient initially arrived, but staff reports his saturations improved to 90-92% with repositioning.  After evaluating the patient numerous times, he was not noted to be hypoxic while in the lateral decubitus position, supine, or prone throughout the remainder of his ED visit.   Patient had labs at his oncologist office on 3/19 and was noted to have a leukocytosis of 28.4.  He has not had a dose of Neulasta since 3/6.  Repeat leukocytosis today of 13.6 with significant improvement in his elevated neutrophil count to 12.2.  Chest x-ray is unchanged from previous.  Influenza panel is negative.  Creatinine is mildly elevated at 1.41 with a BUN to creatinine ratio of ~20:1, which I suspect is secondary to mild dehydration.  He was given an IV fluid bolus and 1 g of Tylenol with resolution of fever and tachycardia.  UA is concerning for infection.  Urine culture sent.  The patient has a history of nephrolithiasis and reports of previous stones that have required surgical removal.  He notes that some previous episodes of obstructing stones have been painless.  Given crystals in his urine along with fever, I think it is reasonable to check a CT stone study to assess for obstructive uropathy.  CT stone study is negative for obstructive stone.  CT scan does note diffuse increased parenchymal density in the left lower lobe in the region of a nodule with associated bronchiectasis that may be post treatment change versus superimposed infection.  Since  the patient is not reporting any worsening pulmonary symptoms, suspect this may be a posttreatment change since the patient's urine is infectious and could be the  source of his fever and generalized weakness.  Previous urine culture from 2017 only grew 60,000 colonies of enterococcus.  Back with JC, pharmacist, who recommends treating the patient with a single 3g dose of fosfomycin to cover for E. coli and enterococcus.  Fosfomycin has been given in the ER.  I have a low suspicion for pneumonia, bronchiectasis flare, obstructive uropathy, PE, COVID-19, influenza, cholecystitis at this time.   The patient has remained afebrile and without tachycardia.  He was counseled on appropriate Tylenol dosing at home.  He reports that he is feeling much better. Strict return precautions given.  He is hemodynamically stable and in no acute distress.  Safe for discharge with outpatient follow-up at this time   Final Clinical Impressions(s) / ED Diagnoses   Final diagnoses:  Acute cystitis with hematuria    ED Discharge Orders    None       Sheilyn Boehlke A, PA-C 04/02/18 0450    Daleen Bo, MD 04/04/18 1520

## 2018-04-01 NOTE — ED Notes (Signed)
X-ray at bedside

## 2018-04-01 NOTE — ED Triage Notes (Addendum)
Fever x 2 days. Reports hx of pulmonary fibrosis cough and SOB has not changed. Last chemo treatment Thursday. No meds for fever PTA. No recent travel, denies exposure  Pt brought back to treatment room via wheelchair and assisted to bed. Pt initial saturations in the 80's on room air. Pt reports he has hx of pulmonary fibrosis, does not wear oxygen. After repositioning and deep breathing, saturations improved to 90-92% on RA.

## 2018-04-01 NOTE — Telephone Encounter (Signed)
Austin Yu had a low-grade fever when he was in the office 03/30/2018 for pump discontinuation.  I talked with him today.  He reports maximum temperature yesterday 100.9 degrees.  He took Tylenol and his temperature normalized.  Baseline cough is slightly worse. He denies significant shortness of breath.  He understands our recommendation is for him to stay at home with home isolation for 14 days or until 3 consecutive days without fever without use of antipyretics and improvement in respiratory symptoms.  He understands to call the office back if signs/symptoms worsen.  He further understands to call 911 if his condition deteriorates.

## 2018-04-01 NOTE — ED Provider Notes (Signed)
  Face-to-face evaluation   History: He presents for evaluation of fever and cough, persistent, for 2 days.  Last chemotherapy 5 days ago.  He does not feel like he has had fever.  He was able to eat and drink some today but somewhat less than usual.  He is being treated with chemotherapy, currently for rectal cancer.  No risk factors for Covid-19.  Physical exam: Awake and alert, calm.  Mouth is moist without lesions.  Heart tachycardic without murmur.  Lungs with bilateral rales, and few rhonchi left-sided.  There is no increased work of breathing.  MDM-clinical evidence for acute infection, suspect pneumonia, versus exacerbation of pulmonary fibrosis.  Medical screening examination/treatment/procedure(s) were conducted as a shared visit with non-physician practitioner(s) and myself.  I personally evaluated the patient during the encounter    Daleen Bo, MD 04/04/18 1521

## 2018-04-02 ENCOUNTER — Telehealth: Payer: Self-pay

## 2018-04-02 ENCOUNTER — Emergency Department (HOSPITAL_COMMUNITY): Payer: PPO

## 2018-04-02 DIAGNOSIS — N200 Calculus of kidney: Secondary | ICD-10-CM | POA: Diagnosis not present

## 2018-04-02 LAB — URINALYSIS, ROUTINE W REFLEX MICROSCOPIC
Bilirubin Urine: NEGATIVE
Glucose, UA: NEGATIVE mg/dL
Ketones, ur: NEGATIVE mg/dL
Nitrite: POSITIVE — AB
Protein, ur: 30 mg/dL — AB
Specific Gravity, Urine: 1.019 (ref 1.005–1.030)
WBC, UA: >50 WBC/hpf — ABNORMAL HIGH (ref 0–5)
pH: 6 (ref 5.0–8.0)

## 2018-04-02 LAB — INFLUENZA PANEL BY PCR (TYPE A & B)
INFLAPCR: NEGATIVE
Influenza B By PCR: NEGATIVE

## 2018-04-02 MED ORDER — HEPARIN SOD (PORK) LOCK FLUSH 100 UNIT/ML IV SOLN
500.0000 [IU] | Freq: Once | INTRAVENOUS | Status: AC
  Administered 2018-04-02: 500 [IU]
  Filled 2018-04-02: qty 5

## 2018-04-02 MED ORDER — FOSFOMYCIN TROMETHAMINE 3 G PO PACK
3.0000 g | PACK | Freq: Once | ORAL | Status: AC
  Administered 2018-04-02: 3 g via ORAL
  Filled 2018-04-02: qty 3

## 2018-04-02 NOTE — Telephone Encounter (Signed)
TC to Mr. Bernhard Per Leander Rams NP spoke with Austin Yu. About low grade fever, and cough, Austin Yu. Stated that he went to the ER yesterday because his fever was 100.9 Austin Yu. Stated he has a UTI which he was given 1 dose of fosfomycin at the ER. Austin Yu. States he does not have a fever but still feels like he has something going on with his throat. Informed him to keep a watch of his temperature to stay inside and if things should get worse to call us for further instruction. Austin Yu. Verbalized understanding

## 2018-04-02 NOTE — ED Notes (Signed)
Patient ambulated to RR without assistance and provided urine sample.

## 2018-04-02 NOTE — ED Notes (Signed)
Patient transported back from CT via stretcher.

## 2018-04-02 NOTE — ED Notes (Signed)
Patient transported to CT via stretcher.

## 2018-04-02 NOTE — Discharge Instructions (Signed)
Thank you for allowing me to care for you today in the Emergency Department.   Your urine was consistent with infection today.  Your urine has been sent for culture to the lab.  You were treated for this infection with fosfomycin in the ER.   To treat your fever at home, you can take 1000 mg of Tylenol once every 8 hours or 650 mg of Tylenol once every 6 hours. You should not take more than 4000 mg of Tylenol from all sources in a 24-hour period.   Please keep your follow-up appointment with your oncologist.  Return to the emergency department if you develop a high fever (>100.5 F) despite taking Tylenol as described above, if you develop persistent vomiting, worsening shortness of breath, chest pain, if you pass out or if you fingers or lips turn blue, or if you develop other new, concerning symptoms.

## 2018-04-03 ENCOUNTER — Telehealth: Payer: Self-pay | Admitting: *Deleted

## 2018-04-03 NOTE — Telephone Encounter (Signed)
Called patient to follow up on his status since call yesterday. His maximum temp has been 99.4. Having no respiratory symptoms other that voice is hoarse. Throat does not hurt. No urinary symptoms. Informed him that he will be called back if his blood cultures or repeat urine culture shows anything that requires further action.

## 2018-04-04 ENCOUNTER — Encounter (HOSPITAL_COMMUNITY): Payer: Self-pay | Admitting: *Deleted

## 2018-04-04 ENCOUNTER — Emergency Department (HOSPITAL_COMMUNITY): Payer: PPO

## 2018-04-04 ENCOUNTER — Telehealth: Payer: Self-pay | Admitting: *Deleted

## 2018-04-04 ENCOUNTER — Emergency Department (HOSPITAL_COMMUNITY)
Admission: EM | Admit: 2018-04-04 | Discharge: 2018-04-04 | Disposition: A | Discharge status: Home / Self Care | Payer: PPO | Attending: Emergency Medicine | Admitting: Emergency Medicine

## 2018-04-04 ENCOUNTER — Other Ambulatory Visit: Payer: Self-pay

## 2018-04-04 DIAGNOSIS — R509 Fever, unspecified: Secondary | ICD-10-CM | POA: Diagnosis not present

## 2018-04-04 DIAGNOSIS — Z8639 Personal history of other endocrine, nutritional and metabolic disease: Secondary | ICD-10-CM | POA: Diagnosis not present

## 2018-04-04 DIAGNOSIS — N3000 Acute cystitis without hematuria: Secondary | ICD-10-CM

## 2018-04-04 DIAGNOSIS — Z85048 Personal history of other malignant neoplasm of rectum, rectosigmoid junction, and anus: Secondary | ICD-10-CM | POA: Diagnosis not present

## 2018-04-04 DIAGNOSIS — Z79899 Other long term (current) drug therapy: Secondary | ICD-10-CM | POA: Insufficient documentation

## 2018-04-04 DIAGNOSIS — R05 Cough: Secondary | ICD-10-CM | POA: Diagnosis not present

## 2018-04-04 DIAGNOSIS — R000 Tachycardia, unspecified: Secondary | ICD-10-CM | POA: Diagnosis not present

## 2018-04-04 LAB — COMPREHENSIVE METABOLIC PANEL
ALK PHOS: 71 U/L (ref 38–126)
ALT: 16 U/L (ref 0–44)
AST: 28 U/L (ref 15–41)
Albumin: 2.9 g/dL — ABNORMAL LOW (ref 3.5–5.0)
Anion gap: 9 (ref 5–15)
BILIRUBIN TOTAL: 1 mg/dL (ref 0.3–1.2)
BUN: 26 mg/dL — ABNORMAL HIGH (ref 8–23)
CALCIUM: 8.4 mg/dL — AB (ref 8.9–10.3)
CO2: 22 mmol/L (ref 22–32)
Chloride: 102 mmol/L (ref 98–111)
Creatinine, Ser: 1.27 mg/dL — ABNORMAL HIGH (ref 0.61–1.24)
GFR calc Af Amer: >60 mL/min (ref >60)
GFR calc non Af Amer: 56 mL/min — ABNORMAL LOW (ref >60)
Glucose, Bld: 135 mg/dL — ABNORMAL HIGH (ref 70–99)
Potassium: 4.1 mmol/L (ref 3.5–5.1)
Sodium: 133 mmol/L — ABNORMAL LOW (ref 135–145)
TOTAL PROTEIN: 7.4 g/dL (ref 6.5–8.1)

## 2018-04-04 LAB — CBC WITH DIFFERENTIAL/PLATELET
Abs Immature Granulocytes: 0.02 10*3/uL (ref 0.00–0.07)
Basophils Absolute: 0.1 10*3/uL (ref 0.0–0.1)
Basophils Relative: 1 %
Eosinophils Absolute: 0 10*3/uL (ref 0.0–0.5)
Eosinophils Relative: 1 %
HEMATOCRIT: 41.2 % (ref 39.0–52.0)
Hemoglobin: 13.8 g/dL (ref 13.0–17.0)
Immature Granulocytes: 0 %
Lymphocytes Relative: 22 %
Lymphs Abs: 1 10*3/uL (ref 0.7–4.0)
MCH: 30.7 pg (ref 26.0–34.0)
MCHC: 33.5 g/dL (ref 30.0–36.0)
MCV: 91.8 fL (ref 80.0–100.0)
Monocytes Absolute: 0.2 10*3/uL (ref 0.1–1.0)
Monocytes Relative: 4 %
NEUTROS PCT: 72 %
Neutro Abs: 3.4 10*3/uL (ref 1.7–7.7)
Platelets: 119 10*3/uL — ABNORMAL LOW (ref 150–400)
RBC: 4.49 MIL/uL (ref 4.22–5.81)
RDW: 14.8 % (ref 11.5–15.5)
WBC: 4.8 10*3/uL (ref 4.0–10.5)
nRBC: 0 % (ref 0.0–0.2)

## 2018-04-04 LAB — PROTIME-INR
INR: 1.2 (ref 0.8–1.2)
Prothrombin Time: 15.1 seconds (ref 11.4–15.2)

## 2018-04-04 LAB — LACTIC ACID, PLASMA: Lactic Acid, Venous: 1.1 mmol/L (ref 0.5–1.9)

## 2018-04-04 MED ORDER — LACTATED RINGERS IV BOLUS
1500.0000 mL | Freq: Once | INTRAVENOUS | Status: AC
  Administered 2018-04-04: 1500 mL via INTRAVENOUS

## 2018-04-04 MED ORDER — VANCOMYCIN HCL 10 G IV SOLR
1750.0000 mg | Freq: Once | INTRAVENOUS | Status: AC
  Administered 2018-04-04: 1750 mg via INTRAVENOUS
  Filled 2018-04-04: qty 1750

## 2018-04-04 MED ORDER — SODIUM CHLORIDE 0.9% FLUSH
3.0000 mL | Freq: Once | INTRAVENOUS | Status: AC
  Administered 2018-04-04: 3 mL via INTRAVENOUS

## 2018-04-04 MED ORDER — SODIUM CHLORIDE 0.9 % IV SOLN
2.0000 g | Freq: Once | INTRAVENOUS | Status: AC
  Administered 2018-04-04: 2 g via INTRAVENOUS
  Filled 2018-04-04: qty 2

## 2018-04-04 MED ORDER — CEPHALEXIN 500 MG PO CAPS: 500.0000 mg | ORAL_CAPSULE | Freq: Two times a day (BID) | ORAL | 0 refills | Status: DC

## 2018-04-04 NOTE — Telephone Encounter (Addendum)
"  [name] daughter [name] [phone]).    Is there another antibiotic my dad may use?    Taking 650 mg Tylenol every eight hours.  Can he use Ibuprofen alternating between Tylenol?    Went to ED Sunday with fever after receiving chemotherapy last week.  Waiting for ED to call results but told he has UTI.  Received medicine but still has fever.    Tylenol at 10:00 pm last evening but temperature = 101.0  F at 6:00 am so took more Tylenol.  Temperature checked during call; T = 96.9 at 9:49 am.  May not be taking in 64 oz water but he's drinking because he'[s urinating a lot.  Says his urine color is yellow.    Fever going on several days now; something else may be going on.  Has occasional cough and runny nose."   Reviewed COVID-19 risk information.  Conveyed Influenza A & B negative along with blood cultures negative to date.

## 2018-04-04 NOTE — ED Notes (Addendum)
Pt aware that urine sample needed. Unable to provide at this time

## 2018-04-04 NOTE — Discharge Instructions (Signed)
Make sure you are drinking plenty of fluids.  You should return to the emergency room if you develop pain, fever, inability to catch her breath.

## 2018-04-04 NOTE — ED Notes (Signed)
EKG given to EDP,Plunkett,MD., for review.

## 2018-04-04 NOTE — Progress Notes (Signed)
A consult was received from an ED physician for Vancomycin, cefepime per pharmacy dosing.  The patient's profile has been reviewed for ht/wt/allergies/indication/available labs.   A one time order has been placed for Vancomycin 1.75 gm iv x1, and Cefepime 2gm iv x1.  Further antibiotics/pharmacy consults should be ordered by admitting physician if indicated.                       Thank you, Nani Skillern Crowford 04/04/2018  10:25 PM

## 2018-04-04 NOTE — ED Triage Notes (Signed)
Pt seen on Monday night here for same symptoms, fever and cough, congested cough, sats 85% in triage on room air, afebrile after taking Tylenol at home, prior to Tylenol Temp 100.9

## 2018-04-04 NOTE — Telephone Encounter (Signed)
Notified daughter that Dr. Benay Spice and [name], NP reviewed his urine culture results and feel most likely the antibiotic he received in the hospital will cover these two bacteria. If he develops fever again of 101, has increased shortness of breath or cough, he needs to be taken to the emergency room. Will follow up on the sensitivity results tomorrow and call them with update. She reports his last temp at 3 pm was 99.7 and this is with last Tylenol dose at 0600. Suggested they continue to push oral fluids and get him to eat as much as he can. Some of his feeling weak and tired are related to the chemo as well. Daughter expressed feeling helpless and became tearful. Provided emotional support and promised she will be called tomorrow. Also encourage them to limit visitors to the home.

## 2018-04-04 NOTE — ED Notes (Addendum)
Pt verbalized discharge instructions and follow up care. Alert and ambulatory. IVs removed. Daughter is driving pt home

## 2018-04-04 NOTE — ED Provider Notes (Addendum)
[facility] DEPT Provider Note   CSN: [id number] Arrival date & time: 04/04/18  2042    History   Chief Complaint Chief Complaint  Patient presents with  . Fever  . Cough  . Cancer    HPI Austin Yu is a 73 y.o. male.     Patient is a 73 year old male with a history of pulmonary fibrosis who does not require oxygen, recently diagnosed rectal cancer just completed his third round of chemotherapy 7 days ago and recurrent renal stones presenting today with persistent fever.  Patient states 5 days ago he developed a fever that has persisted.  It has been 99-101 for the last 5 days.  He has chronic cough and shortness of breath but feels like it is slightly worse.  He is coughing up some mucus but denies any hemoptysis.  He is still been eating and drinking as much as he can but states he probably could be eating and drinking more.  He has had a hoarse voice but denies sore throat.  He has had no abdominal pain, vomiting.  No rectal pain or bloody stools.  Patient was seen in the emergency room 2 days ago.  At that time he was found to have urine that was concerning for infection and cultures grew back enterococcus with susceptibilities still pending.  He was given a dose of fosfomycin while in the emergency room.  He states however since going home he continues to have fever and just does not know what is going on.  He denies any dysuria abdominal or back pain at this time.  He states his doctor did a swab to check for viruses today but he is still waiting on the results.  His biggest complaint is of feeling generally weak and tired.  The history is provided by the patient.  Fever  Max temp prior to arrival:  101 Temp source:  Oral Severity:  Moderate Onset quality:  Gradual Duration:  5 days Timing:  Constant Progression:  Waxing and waning Chronicity:  New Relieved by:  Acetaminophen Worsened by:  Nothing Ineffective treatments:  None tried  Associated symptoms: cough   Associated symptoms: no chest pain, no confusion, no congestion, no diarrhea, no dysuria, no headaches, no nausea, no rash, no rhinorrhea, no sore throat and no vomiting   Risk factors: hx of cancer   Cough  Associated symptoms: fever   Associated symptoms: no chest pain, no headaches, no rash, no rhinorrhea and no sore throat     Past Medical History:  Diagnosis Date  . Abnormal chest sounds   . BPH (benign prostatic hypertrophy)   . Cancer [facility])    rectum cancer; dx 01/21/2018 by Dr. Loletha Carrow by colonoscopy  . Complication of anesthesia    ISSUE W/ EXTUBATION 05-18-2015 IN [facility] -- REFER TO ANESTHESIA NOTE''S  . Elevated PSA   . GERD (gastroesophageal reflux disease)   . History of airway aspiration   . History of kidney stones   . Hyperlipidemia   . Hyperplasia of prostate without lower urinary tract symptoms (LUTS)   . Left ureteral stone   . Lung nodule   . Nephrolithiasis   . Postinflammatory pulmonary fibrosis (H[facility])    PULMOLOGIST-  DR OZDG-  IDIOPATHIC   . Pulmonary fibrosis ([place])   . Strawberry hemangioma of skin   . Upper airway cough syndrome    PULMOLOGIST-  DR UYQI    Patient Active Problem List   Diagnosis Date Noted  .  Rectal cancer ([place]) 02/12/2018  . Goals of care, counseling/discussion 02/12/2018  . Abnormal findings on diagnostic imaging of lung 02/04/2018  . IPF (idiopathic pulmonary fibrosis) ([place]) 02/04/2018  . Positive colorectal cancer screening using Cologuard test   . Upper airway cough syndrome 02/02/2015  . Postinflammatory pulmonary fibrosis ([place]) 02/01/2015    Past Surgical History:  Procedure Laterality Date  . BIOPSY  01/21/2018   Procedure: BIOPSY;  Surgeon: Doran Stabler, MD;  Location: [facility];  Service: Gastroenterology;;  . COLONOSCOPY WITH PROPOFOL N/A 01/21/2018   Procedure: COLONOSCOPY WITH PROPOFOL;  Surgeon: Doran Stabler, MD;  Location: [facility];  Service: Gastroenterology;   Laterality: N/A;  . CYSTOSCOPY WITH HOLMIUM LASER LITHOTRIPSY Left 06/01/2015   Procedure: CYSTOSCOPY WITH HOLMIUM LASER LITHOTRIPSY;  Surgeon: Irine Seal, MD;  Location: [facility];  Service: Urology;  Laterality: Left;  . CYSTOSCOPY WITH URETEROSCOPY, STONE BASKETRY AND STENT PLACEMENT Left 05/18/2015   Procedure: CYSTOSCOPY WITH STENT PLACEMENT AND RETROGRADE PYELOGRAM;  Surgeon: Irine Seal, MD;  Location: [facility];  Service: Urology;  Laterality: Left;  . CYSTOSCOPY WITH URETEROSCOPY, STONE BASKETRY AND STENT PLACEMENT Left 06/01/2015   Procedure: CYSTOSCOPY WITH URETEROSCOPY, STONE BASKETRY AND STENT PLACEMENT;  Surgeon: Irine Seal, MD;  Location: [facility];  Service: Urology;  Laterality: Left;  . EXTRACORPOREAL SHOCK WAVE LITHOTRIPSY  2007  . IR IMAGING GUIDED PORT INSERTION  02/14/2018  . TONSILLECTOMY  age 60-4  . VIDEO BRONCHOSCOPY WITH ENDOBRONCHIAL NAVIGATION Left 02/07/2018   Procedure: VIDEO BRONCHOSCOPY WITH ENDOBRONCHIAL NAVIGATION;  Surgeon: Collene Gobble, MD;  Location: [facility];  Service: Thoracic;  Laterality: Left;  Marland Kitchen VIDEO BRONCHOSCOPY WITH ENDOBRONCHIAL ULTRASOUND Left 02/07/2018   Procedure: VIDEO BRONCHOSCOPY WITH ENDOBRONCHIAL ULTRASOUND;  Surgeon: Collene Gobble, MD;  Location: [facility];  Service: Thoracic;  Laterality: Left;        Home Medications    Prior to Admission medications   Medication Sig Start Date End Date Taking? Authorizing Provider  acetaminophen (TYLENOL) 325 MG tablet Take 650 mg by mouth every 6 (six) hours as needed for moderate pain.    [provider]  famotidine (PEPCID) 20 MG tablet Take 1 tablet (20 mg total) by mouth at bedtime. 08/01/17   Tanda Rockers, MD  lidocaine-prilocaine (EMLA) cream Apply to Port-A-Cath 1 to 2 hours prior to use 02/12/18   Owens Shark, NP  Nintedanib (OFEV) 150 MG CAPS Take 150 mg by mouth 2 (two) times daily.  12/14/16   [provider]  pantoprazole (PROTONIX) 40 MG tablet  TAKE 1 TABLET BY MOUTH EVERY MORNING( 30 TO 60 MINUTES BEFORE FIRST MEAL OF THE DAY) Patient taking differently: Take 40 mg by mouth daily.  10/01/17   Tanda Rockers, MD  prochlorperazine (COMPAZINE) 10 MG tablet Take 1 tablet (10 mg total) by mouth every 6 (six) hours as needed for nausea or vomiting. 02/12/18   Owens Shark, NP  tamsulosin (FLOMAX) 0.4 MG CAPS capsule Take 0.4 mg by mouth at bedtime.  05/07/15   [provider]    Family History Family History  Problem Relation Age of Onset  . Heart failure Father        CHF  . Healthy Brother   . Healthy Daughter   . Healthy Daughter     Social History Social History   Tobacco Use  . Smoking status: Never Smoker  . Smokeless tobacco: Never Used  Substance Use Topics  . Alcohol  use: Yes    Alcohol/week: 0.0 standard drinks    Comment: occasional  . Drug use: No     Allergies   Patient has no known allergies.   Review of Systems Review of Systems  Constitutional: Positive for fever.  HENT: Negative for congestion, rhinorrhea and sore throat.   Respiratory: Positive for cough.   Cardiovascular: Negative for chest pain.  Gastrointestinal: Negative for diarrhea, nausea and vomiting.  Genitourinary: Negative for dysuria.  Skin: Negative for rash.  Neurological: Negative for headaches.  Psychiatric/Behavioral: Negative for confusion.  All other systems reviewed and are negative.    Physical Exam Updated Vital Signs BP 126/75 (BP Location: Left Arm)   Pulse (!) 123   Temp 98.3 F (36.8 C) (Oral)   Resp 16   Ht 5\' 7"  (1.702 m)   Wt 83.9 kg   SpO2 (!) 87% Comment: PN Patty D. at bedside  BMI 28.97 kg/m   Physical Exam Vitals signs and nursing note reviewed.  Constitutional:      General: He is not in acute distress.    Appearance: He is well-developed.  HENT:     Head: Normocephalic and atraumatic.     Right Ear: Tympanic membrane normal.     Mouth/Throat:     Mouth: Mucous membranes are dry.   Eyes:     Conjunctiva/sclera: Conjunctivae normal.     Pupils: Pupils are equal, round, and reactive to light.  Neck:     Musculoskeletal: Normal range of motion and neck supple.  Cardiovascular:     Rate and Rhythm: Regular rhythm. Tachycardia present.     Heart sounds: No murmur.  Pulmonary:     Effort: Pulmonary effort is normal. No respiratory distress.     Breath sounds: Normal breath sounds. No wheezing or rales.     Comments: Fine crackles in bilateral lung fields most prominent at the bases Abdominal:     General: There is no distension.     Palpations: Abdomen is soft.     Tenderness: There is no abdominal tenderness. There is no guarding or rebound.  Musculoskeletal: Normal range of motion.        General: No tenderness.     Right lower leg: No edema.     Left lower leg: No edema.  Skin:    General: Skin is warm and dry.     Capillary Refill: Capillary refill takes 2 to 3 seconds.     Findings: No erythema or rash.  Neurological:     General: No focal deficit present.     Mental Status: He is alert and oriented to person, place, and time. Mental status is at baseline.  Psychiatric:        Mood and Affect: Mood normal.        Behavior: Behavior normal.        Thought Content: Thought content normal.      ED Treatments / Results  Labs (all labs ordered are listed, but only abnormal results are displayed) Labs Reviewed  COMPREHENSIVE METABOLIC PANEL - Abnormal; Notable for the following components:      Result Value   Sodium 133 (*)    Glucose, Bld 135 (*)    BUN 26 (*)    Creatinine, Ser 1.27 (*)    Calcium 8.4 (*)    Albumin 2.9 (*)    GFR calc non Af Amer 56 (*)    All other components within normal limits  CBC WITH DIFFERENTIAL/PLATELET - Abnormal; Notable for  the following components:   Platelets 119 (*)    All other components within normal limits  CULTURE, BLOOD (ROUTINE X 2)  CULTURE, BLOOD (ROUTINE X 2)  LACTIC ACID, PLASMA  PROTIME-INR  LACTIC  ACID, PLASMA  URINALYSIS, ROUTINE W REFLEX MICROSCOPIC    EKG EKG Interpretation  Date/Time:  Thursday April 04 2018 21:13:15 EDT Ventricular Rate:  114 PR Interval:    QRS Duration: 89 QT Interval:  317 QTC Calculation: 437 R Axis:   -18 Text Interpretation:  Sinus tachycardia Consider left atrial enlargement Borderline left axis deviation Abnormal R-wave progression, late transition No significant change since last tracing Confirmed by Blanchie Dessert [phone]) on 04/04/2018 9:30:51 PM   Radiology Dg Chest 2 View  Result Date: 04/04/2018 CLINICAL DATA:  Cough, pulmonary fibrosis. Patient being treated for rectal cancer diagnosed January, 2020. EXAM: CHEST - 2 VIEW COMPARISON:  Chest CT 02/05/2018, CXR 04/01/2018 and 02/07/2018 FINDINGS: Stable cardiomegaly. Nonaneurysmal aorta. Port catheter tip terminates in the right SVC-RA juncture. Chronic interstitial prominence is noted along the lingula, left lower lobe and right upper lobe compatible with known pulmonary fibrosis. Faint ground-glass opacities are noted at the left base right upper lobe. No effusion or pneumothorax. IMPRESSION: Superimposed on chronic interstitial lung disease/pulmonary fibrosis are faint areas of ground-glass opacity which may represent atypical pneumonia possibly viral. It should be noted however that the patient did have ground-glass opacities at least in the left lower lobe on prior chest CT from January and findings could be related to interstitial possibly UIP given the fibrosis. Electronically Signed   By: Ashley Royalty M.D.   On: 04/04/2018 21:35    Procedures Procedures (including critical care time)  Medications Ordered in ED Medications  sodium chloride flush (NS) 0.9 % injection 3 mL (has no administration in time range)  lactated ringers bolus 1,500 mL (has no administration in time range)     Initial Impression / Assessment and Plan / ED Course  I have reviewed the triage vital signs and the  nursing notes.  Pertinent labs & imaging results that were available during my care of the patient were reviewed by me and considered in my medical decision making (see chart for details).       Elderly male presenting today with 5 days of persistent fever in the setting of currently being on chemotherapy.  Temperatures have been between 99-101.  He has pulmonary fibrosis and always has cough and shortness of breath but states it is slightly worse and he is coughing up mucus.  There is no one else sick in his home and he is only been at his doctor's appointments and at home.  He has no GI symptoms but during ER evaluation 2 days ago he was found to have a UTI and was covered with fosfomycin.  Urine culture did grow Enterobacter but susceptibilities are pending.  However patient states he is still not getting better and talked with his doctor today.  States he had a swab done for viruses but does not know the result.  He was flu negative during his last visit.  He is in no acute distress on exam and oxygen saturation of 93% on room air while at rest.  He is tachycardic from 1 10-1 20.  Concern for dehydration.  Sepsis labs ordered.  Patient's chest x-ray shows superimposed on chronic interstitial lung disease with faint areas of groundglass opacity which may represent atypical pneumonia some were seen on prior CT in January so could be  chronic.  Urine culture returning with Enterobacter pharmacy recommended covering with Vanco and cefepime.  Patient given IV fluids 1.5 L.  Lactate was within normal limits.  CMP at baseline.  WBC wnl.  Blood cultures from 2 days ago are negative.  Patient is feeling better after IV fluids.  O2 sats remained greater than 90%.  Tachycardia is improved.  Discussed with the hospitalist however he states there is not clear criteria for admission tonight.  As patient does not have a lactic acid, white count or significantly abnormal vital signs.  Chest x-ray appears to be more  chronic in nature.  Patient is not requiring oxygen in the emergency room.  Vanc was stopped but he did receive cefepime.  Will discharge home with Keflex which patient is comfortable with.  Cautioned that he return immediately if he develops shortness of breath or worsening symptoms.  Recommended calling his oncologist in the morning for further instruction.  Final Clinical Impressions(s) / ED Diagnoses   Final diagnoses:  Fever, unspecified fever cause  Acute cystitis without hematuria    ED Discharge Orders         Ordered    cephALEXin (KEFLEX) 500 MG capsule  2 times daily     04/04/18 2327           Blanchie Dessert, MD 04/04/18 7014    Blanchie Dessert, MD 04/04/18 [phone]

## 2018-04-04 NOTE — ED Notes (Signed)
Patient transported to X-ray 

## 2018-04-04 NOTE — ED Notes (Signed)
2 sets of blood culture sent to labs as well as save tubes

## 2018-04-05 ENCOUNTER — Telehealth: Payer: Self-pay | Admitting: *Deleted

## 2018-04-05 LAB — URINE CULTURE: Culture: >=100000 — AB

## 2018-04-05 NOTE — Telephone Encounter (Signed)
Spoke with daughter and briefly with patient, who was still hoarse. The highest his temp has been today is 99.0. Dr. Benay Spice has reviewed the sensitivities on his urine culture and says to D/C Keflex. If he develops fever again, may start him on Levaquin. Instructed her to call on call line for return fever of 101 or more, increased shortness of breath.

## 2018-04-07 ENCOUNTER — Other Ambulatory Visit: Payer: Self-pay | Admitting: Oncology

## 2018-04-07 ENCOUNTER — Telehealth: Payer: Self-pay | Admitting: Emergency Medicine

## 2018-04-07 LAB — CULTURE, BLOOD (ROUTINE X 2)
Culture: NO GROWTH
Culture: NO GROWTH
Special Requests: ADEQUATE

## 2018-04-07 NOTE — Telephone Encounter (Signed)
Post ED Visit - Positive Culture Follow-up: Unsuccessful Patient Follow-up  Culture assessed and recommendations reviewed by:  []  Elenor Quinones, Pharm.D. []  Heide Guile, Pharm.D., BCPS AQ-ID []  Parks Neptune, Pharm.D., BCPS []  Alycia Rossetti, Pharm.D., BCPS []  [place], Pharm.D., BCPS, AAHIVP []  Legrand Como, Pharm.D., BCPS, AAHIVP []  Wynell Balloon, PharmD [x]  Reuel Boom, PharmD, BCPS  Positive urine culture  [x]  Patient discharged without antimicrobial prescription and treatment is now indicated []  Organism is resistant to prescribed ED discharge antimicrobial []  Patient with positive blood cultures   Unable to contact patient after 3 attempts, letter will be sent to address on file  Georgina Peer 04/07/2018, 5:03 PM

## 2018-04-09 LAB — CULTURE, BLOOD (ROUTINE X 2)
Culture: NO GROWTH
Culture: NO GROWTH
Special Requests: ADEQUATE

## 2018-04-10 ENCOUNTER — Other Ambulatory Visit: Payer: Self-pay

## 2018-04-10 DIAGNOSIS — C20 Malignant neoplasm of rectum: Secondary | ICD-10-CM

## 2018-04-11 ENCOUNTER — Ambulatory Visit: Payer: PPO

## 2018-04-11 ENCOUNTER — Ambulatory Visit: Payer: PPO | Admitting: Nurse Practitioner

## 2018-04-11 ENCOUNTER — Other Ambulatory Visit: Payer: PPO

## 2018-04-11 ENCOUNTER — Telehealth: Payer: Self-pay | Admitting: Nurse Practitioner

## 2018-04-11 NOTE — Telephone Encounter (Signed)
R/s appt per sch message - pt aware of appts on 4/9

## 2018-04-13 ENCOUNTER — Ambulatory Visit: Payer: PPO

## 2018-04-16 ENCOUNTER — Ambulatory Visit (INDEPENDENT_AMBULATORY_CARE_PROVIDER_SITE_OTHER): Payer: PPO | Admitting: Cardiology

## 2018-04-16 ENCOUNTER — Encounter: Payer: Self-pay | Admitting: Cardiology

## 2018-04-16 ENCOUNTER — Other Ambulatory Visit: Payer: Self-pay

## 2018-04-16 VITALS — BP 100/62 | HR 128 | Ht 67.0 in | Wt 180.0 lb

## 2018-04-16 DIAGNOSIS — C20 Malignant neoplasm of rectum: Secondary | ICD-10-CM | POA: Diagnosis not present

## 2018-04-16 DIAGNOSIS — Z79899 Other long term (current) drug therapy: Secondary | ICD-10-CM | POA: Diagnosis not present

## 2018-04-16 DIAGNOSIS — E782 Mixed hyperlipidemia: Secondary | ICD-10-CM | POA: Diagnosis not present

## 2018-04-16 DIAGNOSIS — Z5189 Encounter for other specified aftercare: Secondary | ICD-10-CM | POA: Diagnosis not present

## 2018-04-16 DIAGNOSIS — J84112 Idiopathic pulmonary fibrosis: Secondary | ICD-10-CM

## 2018-04-16 DIAGNOSIS — R000 Tachycardia, unspecified: Secondary | ICD-10-CM | POA: Diagnosis not present

## 2018-04-16 DIAGNOSIS — Q825 Congenital non-neoplastic nevus: Secondary | ICD-10-CM | POA: Diagnosis not present

## 2018-04-16 DIAGNOSIS — M109 Gout, unspecified: Secondary | ICD-10-CM | POA: Diagnosis not present

## 2018-04-16 DIAGNOSIS — I517 Cardiomegaly: Secondary | ICD-10-CM | POA: Diagnosis not present

## 2018-04-16 DIAGNOSIS — R0989 Other specified symptoms and signs involving the circulatory and respiratory systems: Secondary | ICD-10-CM | POA: Diagnosis not present

## 2018-04-16 DIAGNOSIS — N400 Enlarged prostate without lower urinary tract symptoms: Secondary | ICD-10-CM | POA: Diagnosis not present

## 2018-04-16 DIAGNOSIS — K219 Gastro-esophageal reflux disease without esophagitis: Secondary | ICD-10-CM | POA: Diagnosis not present

## 2018-04-16 DIAGNOSIS — C7800 Secondary malignant neoplasm of unspecified lung: Secondary | ICD-10-CM | POA: Diagnosis not present

## 2018-04-16 DIAGNOSIS — N183 Chronic kidney disease, stage 3 (moderate): Secondary | ICD-10-CM | POA: Diagnosis not present

## 2018-04-16 DIAGNOSIS — N200 Calculus of kidney: Secondary | ICD-10-CM | POA: Diagnosis not present

## 2018-04-16 NOTE — Progress Notes (Signed)
Virtual Visit via Video Note   Subjective:   Austin Yu, male    DOB: [birth year], 73 y.o.   MRN: [id number]   I connected with the patient on 04/16/18 by a video enabled telemedicine application and verified that I am speaking with the correct person using two identifiers.     I discussed the limitations of evaluation and management by telemedicine and the availability of in person appointments. The patient expressed understanding and agreed to proceed.   This visit type was conducted due to national recommendations for restrictions regarding the COVID-19 Pandemic (e.g. social distancing).  This format is felt to be most appropriate for this patient at this time.  All issues noted in this document were discussed and addressed.  No physical exam was performed (except for noted visual exam findings with Tele health visits).  The patient has consented to conduct a Tele health visit and understands insurance will be billed.     Chief complaint:  Tachycardia   HPI  73 y.o. Caucasian male with with pulmonary fibrosis, rectal cancer on FOLFOX chemotherapy, nephrolithiasis, referred for evaluation of tachycardia.    Patient was recently seen in the ED with recent fever and was treated for UTI. Patient was seen by his PCP today and was noted to have tachycardia. He was thus referred to [place] for further evaluation.  Patient states that his resting heart rate at baseline is 80-90 bpm.  However, for last 3 weeks, it has persisted greater than 110 bpm.  Patient lives alone.  He has had pulmonary fibrosis since 2017, for which he follows Dr. Floy Sabina at [facility].  He is currently not on any pulmonary fibrosis specific therapy.  He has had stable cough with mild sputum production with no recent change.  He has stable, mild exertional dyspnea.  He is able to climb 20 stairs to his apartment without much difficulty.  He is currently undergoing chemotherapy for rectal cancer, and has 2 more cycles of FOLFOX  left in his treatment.    Past Medical History:  Diagnosis Date  . Abnormal chest sounds   . BPH (benign prostatic hypertrophy)   . Cancer [facility])    rectum cancer; dx 01/21/2018 by Dr. Loletha Carrow by colonoscopy  . Complication of anesthesia    ISSUE W/ EXTUBATION 05-18-2015 IN [facility] -- REFER TO ANESTHESIA NOTE''S  . Elevated PSA   . GERD (gastroesophageal reflux disease)   . History of airway aspiration   . History of kidney stones   . Hyperlipidemia   . Hyperplasia of prostate without lower urinary tract symptoms (LUTS)   . Left ureteral stone   . Lung nodule   . Nephrolithiasis   . Postinflammatory pulmonary fibrosis (HCC)    PULMOLOGIST-  DR YWVP-  IDIOPATHIC   . Pulmonary fibrosis ([place])   . Strawberry hemangioma of skin   . Upper airway cough syndrome    PULMOLOGIST-  DR XTGG     Past Surgical History:  Procedure Laterality Date  . BIOPSY  01/21/2018   Procedure: BIOPSY;  Surgeon: Doran Stabler, MD;  Location: [facility];  Service: Gastroenterology;;  . COLONOSCOPY WITH PROPOFOL N/A 01/21/2018   Procedure: COLONOSCOPY WITH PROPOFOL;  Surgeon: Doran Stabler, MD;  Location: [facility];  Service: Gastroenterology;  Laterality: N/A;  . CYSTOSCOPY WITH HOLMIUM LASER LITHOTRIPSY Left 06/01/2015   Procedure: CYSTOSCOPY WITH HOLMIUM LASER LITHOTRIPSY;  Surgeon: Irine Seal, MD;  Location[facility];  Service: Urology;  Laterality: Left;  .  CYSTOSCOPY WITH URETEROSCOPY, STONE BASKETRY AND STENT PLACEMENT Left 05/18/2015   Procedure: CYSTOSCOPY WITH STENT PLACEMENT AND RETROGRADE PYELOGRAM;  Surgeon: Irine Seal, MD;  Location: [facility];  Service: Urology;  Laterality: Left;  . CYSTOSCOPY WITH URETEROSCOPY, STONE BASKETRY AND STENT PLACEMENT Left 06/01/2015   Procedure: CYSTOSCOPY WITH URETEROSCOPY, STONE BASKETRY AND STENT PLACEMENT;  Surgeon: Irine Seal, MD;  Location: [facility];  Service: Urology;  Laterality: Left;  . EXTRACORPOREAL SHOCK WAVE  LITHOTRIPSY  2007  . IR IMAGING GUIDED PORT INSERTION  02/14/2018  . TONSILLECTOMY  age [age]  . VIDEO BRONCHOSCOPY WITH ENDOBRONCHIAL NAVIGATION Left 02/07/2018   Procedure: VIDEO BRONCHOSCOPY WITH ENDOBRONCHIAL NAVIGATION;  Surgeon: Collene Gobble, MD;  Location: [facility];  Service: Thoracic;  Laterality: Left;  Marland Kitchen VIDEO BRONCHOSCOPY WITH ENDOBRONCHIAL ULTRASOUND Left 02/07/2018   Procedure: VIDEO BRONCHOSCOPY WITH ENDOBRONCHIAL ULTRASOUND;  Surgeon: Collene Gobble, MD;  Location: [facility];  Service: Thoracic;  Laterality: Left;     Social History   Socioeconomic History  . Marital status: Widowed    Spouse name: Not on file  . Number of children: 2  . Years of education: Not on file  . Highest education level: Not on file  Occupational History  . Occupation: Retired  Scientific laboratory technician  . Financial resource strain: Not on file  . Food insecurity:    Worry: Not on file    Inability: Not on file  . Transportation needs:    Medical: No    Non-medical: No  Tobacco Use  . Smoking status: Never Smoker  . Smokeless tobacco: Never Used  Substance and Sexual Activity  . Alcohol use: Yes    Alcohol/week: 0.0 standard drinks    Comment: occasional  . Drug use: No  . Sexual activity: Not on file  Lifestyle  . Physical activity:    Days per week: Not on file    Minutes per session: Not on file  . Stress: Not on file  Relationships  . Social connections:    Talks on phone: Not on file    Gets together: Not on file    Attends religious service: Not on file    Active member of club or organization: Not on file    Attends meetings of clubs or organizations: Not on file    Relationship status: Not on file  . Intimate partner violence:    Fear of current or ex partner: Not on file    Emotionally abused: Not on file    Physically abused: Not on file    Forced sexual activity: Not on file  Other Topics Concern  . Not on file  Social History Narrative  . Not on file     Current Outpatient  Medications on File Prior to Visit  Medication Sig Dispense Refill  . acetaminophen (TYLENOL) 325 MG tablet Take 650 mg by mouth every 6 (six) hours as needed for moderate pain.    . cephALEXin (KEFLEX) 500 MG capsule Take 1 capsule (500 mg total) by mouth 2 (two) times daily. 20 capsule 0  . famotidine (PEPCID) 20 MG tablet Take 1 tablet (20 mg total) by mouth at bedtime. 90 tablet 3  . lidocaine-prilocaine (EMLA) cream Apply to Port-A-Cath 1 to 2 hours prior to use 30 g 2  . pantoprazole (PROTONIX) 40 MG tablet TAKE 1 TABLET BY MOUTH EVERY MORNING( 30 TO 60 MINUTES BEFORE FIRST MEAL OF THE DAY) (Patient taking differently: Take 40 mg by mouth daily. ) 30 tablet  5  . prochlorperazine (COMPAZINE) 10 MG tablet Take 1 tablet (10 mg total) by mouth every 6 (six) hours as needed for nausea or vomiting. 30 tablet 2  . tamsulosin (FLOMAX) 0.4 MG CAPS capsule Take 0.4 mg by mouth at bedtime.      No current facility-administered medications on file prior to visit.     Cardiovascular studies:  EKG 04/16/2018: Sinus tachycardia 114 bpm. Borderline left axis deviation, poor R wave progression.  CXR 04/04/2018: Superimposed on chronic interstitial lung disease/pulmonary fibrosis are faint areas of ground-glass opacity which may represent atypical pneumonia possibly viral. It should be noted however that the patient did have ground-glass opacities at least in the left lower lobe on prior chest CT from January and findings could be related to interstitial possibly UIP given the fibrosis.  EKG 04/16/2018: Sinus tachycardia 114 bpm. Borderline left axis deviation, poor R wave progression.  Recent labs: 04/10/2018: Glucose 92. BUN/Cr 18/1.1. eGFR 67. Na/K 144/4.2 WBC 8.6. H/H 16/49. MCV 96. Platelets 213. Rest of the CMP normal.   Results for POOKELA, SELLIN" (MRN [id number]) as of 04/16/2018 12:17  Ref. Range 04/04/2018 21:23  COMPREHENSIVE METABOLIC PANEL Unknown Rpt (A)  Sodium Latest Ref  Range: 135 - 145 mmol/L 133 (L)  Potassium Latest Ref Range: 3.5 - 5.1 mmol/L 4.1  Chloride Latest Ref Range: 98 - 111 mmol/L 102  CO2 Latest Ref Range: 22 - 32 mmol/L 22  Glucose Latest Ref Range: 70 - 99 mg/dL 135 (H)  BUN Latest Ref Range: 8 - 23 mg/dL 26 (H)  Creatinine Latest Ref Range: 0.61 - 1.24 mg/dL 1.27 (H)  Calcium Latest Ref Range: 8.9 - 10.3 mg/dL 8.4 (L)  Anion gap Latest Ref Range: 5 - 15  9  Alkaline Phosphatase Latest Ref Range: 38 - 126 U/L 71  Albumin Latest Ref Range: 3.5 - 5.0 g/dL 2.9 (L)  AST Latest Ref Range: 15 - 41 U/L 28  ALT Latest Ref Range: 0 - 44 U/L 16  Total Protein Latest Ref Range: 6.5 - 8.1 g/dL 7.4  Total Bilirubin Latest Ref Range: 0.3 - 1.2 mg/dL 1.0  GFR, Est Non African American Latest Ref Range: >60 mL/min 56 (L)  GFR, Est African American Latest Ref Range: >60 mL/min >60  Lactic Acid, Venous Latest Ref Range: 0.5 - 1.9 mmol/L 1.1   Results for KAMERYN, TISDEL" (MRN [id number]) as of 04/16/2018 12:17  Ref. Range 04/04/2018 21:23  WBC Latest Ref Range: 4.0 - 10.5 K/uL 4.8  RBC Latest Ref Range: 4.22 - 5.81 MIL/uL 4.49  Hemoglobin Latest Ref Range: 13.0 - 17.0 g/dL 13.8  HCT Latest Ref Range: 39.0 - 52.0 % 41.2  MCV Latest Ref Range: 80.0 - 100.0 fL 91.8  MCH Latest Ref Range: 26.0 - 34.0 pg 30.7  MCHC Latest Ref Range: 30.0 - 36.0 g/dL 33.5  RDW Latest Ref Range: 11.5 - 15.5 % 14.8  Platelets Latest Ref Range: 150 - 400 K/uL 119 (L)  nRBC Latest Ref Range: 0.0 - 0.2 % 0.0    Review of Systems  Constitution: Negative for decreased appetite, malaise/fatigue, weight gain and weight loss.  HENT: Negative for congestion.   Eyes: Negative for visual disturbance.  Cardiovascular: Positive for palpitations. Negative for chest pain, dyspnea on exertion (Mild, stable), leg swelling and syncope.  Respiratory: Positive for shortness of breath.   Endocrine: Negative for cold intolerance.  Hematologic/Lymphatic: Does not bruise/bleed easily.   Skin: Negative for itching and rash.  Musculoskeletal:  Negative for myalgias.  Gastrointestinal: Negative for abdominal pain, nausea and vomiting.  Genitourinary: Negative for dysuria.  Neurological: Negative for dizziness and weakness.  Psychiatric/Behavioral: The patient is not nervous/anxious.   All other systems reviewed and are negative.        Vitals:   04/16/18 1311  BP: 100/62  Pulse: (!) 128  SpO2: 92%   Noted at PCP office on 04/16/2018 11:07 AM  Observation/findings during video visit   Objective:   Physical Exam  Constitutional: He is oriented to person, place, and time. He appears well-developed and well-nourished. No distress.  Neck: No JVD present.  Pulmonary/Chest: Effort normal.  Musculoskeletal:        General: No edema.  Neurological: He is alert and oriented to person, place, and time.  Psychiatric: He has a normal mood and affect.  Nursing note and vitals reviewed.       Assessment & Recommendations:   73 y.o. Caucasian male with with pulmonary fibrosis, rectal cancer on FOLFOX chemotherapy, nephrolithiasis, referred for evaluation of tachycardia.    1. Tachycardia EKG performed at PCP office today shows sinus tachycardia.  Although he does not have any other new symptoms, his sinus tachycardia has persisted for 3 weeks, even after recovery from his recent UTI. While the differential is wide, given that patient is on FOLFOX chemotherapy for his rectal cancer, it is important to evaluate his LVEF to rule out cardiotoxicity.  This could potentially have implications on his upcoming chemotherapy sessions. I will obtain echocardiogram on 04/18/2018. We will ensure to minimize patient's exposure to any other patients/staff during this visit and emphasize safety precautions.   2. Rectal cancer ([place]) On Folfox chemotherapy.  3. IPF (idiopathic pulmonary fibrosis) (HCC) At baseline.    Nigel Mormon, MD [facility] [place] Pager:  [phone] Office: [phone] If no answer Cell [phone]

## 2018-04-18 ENCOUNTER — Inpatient Hospital Stay (HOSPITAL_BASED_OUTPATIENT_CLINIC_OR_DEPARTMENT_OTHER): Payer: PPO | Admitting: Nurse Practitioner

## 2018-04-18 ENCOUNTER — Inpatient Hospital Stay: Payer: PPO

## 2018-04-18 ENCOUNTER — Telehealth: Payer: Self-pay | Admitting: *Deleted

## 2018-04-18 ENCOUNTER — Other Ambulatory Visit: Payer: Self-pay

## 2018-04-18 ENCOUNTER — Inpatient Hospital Stay: Payer: PPO | Attending: Nurse Practitioner

## 2018-04-18 ENCOUNTER — Encounter: Payer: Self-pay | Admitting: Nurse Practitioner

## 2018-04-18 ENCOUNTER — Telehealth: Payer: Self-pay | Admitting: Nurse Practitioner

## 2018-04-18 VITALS — BP 124/77 | HR 110 | Temp 97.8°F | Resp 18 | Ht 67.0 in | Wt 181.6 lb

## 2018-04-18 VITALS — HR 90

## 2018-04-18 DIAGNOSIS — C20 Malignant neoplasm of rectum: Secondary | ICD-10-CM

## 2018-04-18 DIAGNOSIS — R000 Tachycardia, unspecified: Secondary | ICD-10-CM

## 2018-04-18 DIAGNOSIS — R918 Other nonspecific abnormal finding of lung field: Secondary | ICD-10-CM

## 2018-04-18 DIAGNOSIS — Z5189 Encounter for other specified aftercare: Secondary | ICD-10-CM | POA: Insufficient documentation

## 2018-04-18 DIAGNOSIS — Z95828 Presence of other vascular implants and grafts: Secondary | ICD-10-CM | POA: Insufficient documentation

## 2018-04-18 DIAGNOSIS — Z5111 Encounter for antineoplastic chemotherapy: Secondary | ICD-10-CM | POA: Diagnosis not present

## 2018-04-18 LAB — CBC WITH DIFFERENTIAL (CANCER CENTER ONLY)
Abs Immature Granulocytes: 0.17 10*3/uL — ABNORMAL HIGH (ref 0.00–0.07)
Basophils Absolute: 0.1 10*3/uL (ref 0.0–0.1)
Basophils Relative: 0 %
Eosinophils Absolute: 0 10*3/uL (ref 0.0–0.5)
Eosinophils Relative: 0 %
HCT: 43.4 % (ref 39.0–52.0)
Hemoglobin: 14.1 g/dL (ref 13.0–17.0)
Immature Granulocytes: 1 %
Lymphocytes Relative: 12 %
Lymphs Abs: 2.4 10*3/uL (ref 0.7–4.0)
MCH: 30.6 pg (ref 26.0–34.0)
MCHC: 32.5 g/dL (ref 30.0–36.0)
MCV: 94.1 fL (ref 80.0–100.0)
Monocytes Absolute: 1.4 10*3/uL — ABNORMAL HIGH (ref 0.1–1.0)
Monocytes Relative: 7 %
Neutro Abs: 16.4 10*3/uL — ABNORMAL HIGH (ref 1.7–7.7)
Neutrophils Relative %: 80 %
Platelet Count: 312 10*3/uL (ref 150–400)
RBC: 4.61 MIL/uL (ref 4.22–5.81)
RDW: 16.5 % — ABNORMAL HIGH (ref 11.5–15.5)
WBC Count: 20.4 10*3/uL — ABNORMAL HIGH (ref 4.0–10.5)
nRBC: 0 % (ref 0.0–0.2)

## 2018-04-18 LAB — CMP (CANCER CENTER ONLY)
ALT: 19 U/L (ref 0–44)
AST: 25 U/L (ref 15–41)
Albumin: 2.9 g/dL — ABNORMAL LOW (ref 3.5–5.0)
Alkaline Phosphatase: 80 U/L (ref 38–126)
Anion gap: 13 (ref 5–15)
BUN: 18 mg/dL (ref 8–23)
CO2: 22 mmol/L (ref 22–32)
Calcium: 9.1 mg/dL (ref 8.9–10.3)
Chloride: 103 mmol/L (ref 98–111)
Creatinine: 1.31 mg/dL — ABNORMAL HIGH (ref 0.61–1.24)
GFR, Est AFR Am: >60 mL/min (ref >60)
GFR, Estimated: 54 mL/min — ABNORMAL LOW (ref >60)
Glucose, Bld: 163 mg/dL — ABNORMAL HIGH (ref 70–99)
Potassium: 4.2 mmol/L (ref 3.5–5.1)
Sodium: 138 mmol/L (ref 135–145)
Total Bilirubin: 0.3 mg/dL (ref 0.3–1.2)
Total Protein: 8.1 g/dL (ref 6.5–8.1)

## 2018-04-18 MED ORDER — SODIUM CHLORIDE 0.9% FLUSH
10.0000 mL | Freq: Once | INTRAVENOUS | Status: AC
  Administered 2018-04-18: 10 mL
  Filled 2018-04-18: qty 10

## 2018-04-18 MED ORDER — FLUOROURACIL CHEMO INJECTION 2.5 GM/50ML
400.0000 mg/m2 | Freq: Once | INTRAVENOUS | Status: AC
  Administered 2018-04-18: 15:00:00 800 mg via INTRAVENOUS
  Filled 2018-04-18: qty 16

## 2018-04-18 MED ORDER — SODIUM CHLORIDE 0.9 % IV SOLN
2400.0000 mg/m2 | INTRAVENOUS | Status: DC
  Administered 2018-04-18: 4850 mg via INTRAVENOUS
  Filled 2018-04-18: qty 97

## 2018-04-18 MED ORDER — DEXTROSE 5 % IV SOLN
Freq: Once | INTRAVENOUS | Status: AC
  Filled 2018-04-18: qty 250

## 2018-04-18 MED ORDER — LEUCOVORIN CALCIUM INJECTION 350 MG
400.0000 mg/m2 | Freq: Once | INTRAVENOUS | Status: AC
  Administered 2018-04-18: 13:00:00 808 mg via INTRAVENOUS
  Filled 2018-04-18: qty 40.4

## 2018-04-18 MED ORDER — DEXTROSE 5 % IV SOLN
Freq: Once | INTRAVENOUS | Status: AC
  Administered 2018-04-18: 12:00:00 via INTRAVENOUS
  Filled 2018-04-18: qty 250

## 2018-04-18 NOTE — Telephone Encounter (Signed)
Informed patient of CT scan at [facility] tomorrow at 0845/0900 and he is to have liquids only for 4 hours prior. He will need to come to [facility] at 0800 to have his pump stopped and flushed and then return after scan to have it restarted. Can't do scan with chemo infusing. He understands and agrees.

## 2018-04-18 NOTE — Telephone Encounter (Signed)
Scheduled appt per 4/9 los. °

## 2018-04-18 NOTE — Patient Instructions (Signed)
Coronavirus (COVID-19) Are you at risk?  Are you at risk for the Coronavirus (COVID-19)?  To be considered HIGH RISK for Coronavirus (COVID-19), you have to meet the following criteria:  . Traveled to [place], [place], [place], [place] or [place]; or in the [place] to [place], [place], [place], or [place]; and have fever, cough, and shortness of breath within the last 2 weeks of travel OR . Been in close contact with a person diagnosed with COVID-19 within the last 2 weeks and have fever, cough, and shortness of breath . IF YOU DO NOT MEET THESE CRITERIA, YOU ARE CONSIDERED LOW RISK FOR COVID-19.  What to do if you are HIGH RISK for COVID-19?  . If you are having a medical emergency, call 911. . Seek medical care right away. Before you go to a doctor's office, urgent care or emergency department, call ahead and tell them about your recent travel, contact with someone diagnosed with COVID-19, and your symptoms. You should receive instructions from your physician's office regarding next steps of care.  . When you arrive at healthcare provider, tell the healthcare staff immediately you have returned from visiting [place], [place], [place], [place] or [place]; or traveled in the [place] to [place], [place], [place], or [place]; in the last two weeks or you have been in close contact with a person diagnosed with COVID-19 in the last 2 weeks.   . Tell the health care staff about your symptoms: fever, cough and shortness of breath. . After you have been seen by a medical provider, you will be either: o Tested for (COVID-19) and discharged home on quarantine except to seek medical care if symptoms worsen, and asked to  - Stay home and avoid contact with others until you get your results (4-5 days)  - Avoid travel on public transportation if possible (such as bus, train, or airplane) or o Sent to the Emergency Department by EMS for evaluation, COVID-19 testing, and possible  admission depending on your condition and test results.  What to do if you are LOW RISK for COVID-19?  Reduce your risk of any infection by using the same precautions used for avoiding the common cold or flu:  . Wash your hands often with soap and warm water for at least 20 seconds.  If soap and water are not readily available, use an alcohol-based hand sanitizer with at least 60% alcohol.  . If coughing or sneezing, cover your mouth and nose by coughing or sneezing into the elbow areas of your shirt or coat, into a tissue or into your sleeve (not your hands). . Avoid shaking hands with others and consider head nods or verbal greetings only. . Avoid touching your eyes, nose, or mouth with unwashed hands.  . Avoid close contact with people who are sick. . Avoid places or events with large numbers of people in one location, like concerts or sporting events. . Carefully consider travel plans you have or are making. . If you are planning any travel outside or inside the [place], visit the CDC's Travelers' Health webpage for the latest health notices. . If you have some symptoms but not all symptoms, continue to monitor at home and seek medical attention if your symptoms worsen. . If you are having a medical emergency, call 911.   ADDITIONAL HEALTHCARE OPTIONS FOR PATIENTS  [place] Tele[facility] / e-Visit: [email or web address]         [facility]: [phone]  [place]   Urgent Care: [place]: [place] Discharge Instructions for Patients Receiving Chemotherapy  Today you received the following chemotherapy agents: Leucovorin and Fluorouracil (Adrucil, 5-FU)  To help prevent nausea and vomiting after your treatment, we encourage you to take your nausea medication as directed.   If you develop nausea and vomiting that is not controlled by your nausea  medication, call the clinic.   BELOW ARE SYMPTOMS THAT SHOULD BE REPORTED IMMEDIATELY:  *FEVER GREATER THAN 100.5 F  *CHILLS WITH OR WITHOUT FEVER  NAUSEA AND VOMITING THAT IS NOT CONTROLLED WITH YOUR NAUSEA MEDICATION  *UNUSUAL SHORTNESS OF BREATH  *UNUSUAL BRUISING OR BLEEDING  TENDERNESS IN MOUTH AND THROAT WITH OR WITHOUT PRESENCE OF ULCERS  *URINARY PROBLEMS  *BOWEL PROBLEMS  UNUSUAL RASH Items with * indicate a potential emergency and should be followed up as soon as possible.  Feel free to call the clinic should you have any questions or concerns. The clinic phone number is [phone].  Please show the [place] at check-in to the Emergency Department and triage nurse.

## 2018-04-18 NOTE — Progress Notes (Addendum)
[name] OFFICE PROGRESS NOTE   Diagnosis: Rectal cancer  INTERVAL HISTORY:   Austin Yu returns for follow-up.  He completed cycle 3 FOLFOX 03/28/2018.  He was seen in the emergency department on 04/01/2018 with fever and cough.  Chest x-ray showed chronic lung disease without acute findings.  Influenza panel was negative.  Urine appeared infected.  He received a dose of fosfomycin.  Urine culture returned positive for Staphylococcus epidermidis and Enterococcus faecalis.  He was seen in the emergency department again on 04/04/2018 with persistent fever, cough and shortness of breath.  Chest x-ray showed superimposed on chronic interstitial lung disease/pulmonary fibrosis faint areas of groundglass opacity possibly representing atypical pneumonia possibly viral.  Chest x-ray findings felt to be more chronic.  He was discharged home.  Blood cultures on 04/01/2018 and 04/04/2018 were negative.  He reports no fever for at least 14 days.  No chills.  Dyspnea and cough at baseline.  He reports a good appetite.  No nausea or vomiting.  No diarrhea.  No urinary symptoms.  No numbness or tingling in the hands or feet.  He denies chest pain.  He reports recent pain and swelling involving the right ankle.  He saw his PCP yesterday and reports being diagnosed with gout.  He is currently on prednisone.  He saw cardiology earlier this week regarding persistent tachycardia.  He has been referred for a 2D echo.  Objective:  Vital signs in last 24 hours:  Blood pressure 124/77, pulse (!) 110, temperature 97.8 F (36.6 C), temperature source Oral, resp. rate 18, height 5\' 7"  (1.702 m), weight 181 lb 9.6 oz (82.4 kg), SpO2 95 %.    Resp: Rales at the lower one half of both lung fields left louder than right.  No respiratory distress. Cardio: Regular, mild tachycardia. GI: No hepatomegaly. Vascular: No leg edema.  Calves soft and nontender. Musculoskeletal: Right ankle is edematous. Neuro:  Vibratory sense mildly decreased over the fingertips per tuning fork exam. Skin: Palms without erythema. Port-A-Cath without erythema.   Lab Results:  Lab Results  Component Value Date   WBC 20.4 (H) 04/18/2018   HGB 14.1 04/18/2018   HCT 43.4 04/18/2018   MCV 94.1 04/18/2018   PLT 312 04/18/2018   NEUTROABS 16.4 (H) 04/18/2018    Imaging:  No results found.  Medications: I have reviewed the patient's current medications.  Assessment/Plan: 1. Rectal cancer ? Distal rectal mass noted on colonoscopy 01/21/2018, biopsy confirmed invasive adenocarcinoma, cytokeratin 20 (patchy) and CDX 2+, cytokeratin 7 and TTF-1 negative ? CTs 01/29/2018-new left infrahilar mass, enlarged left hilar lymph node, mild subcarinal lymphadenopathy, confluent ill-defined lesion in area fibrosis at the posterior left lower lobe measuring 2 x 1.5 cm, rectal mass, perirectal lymph nodes ? MRI 01/28/2018-[id number] located at 6.6 cm from the internal anal sphincter, 2 mesorectal lymph nodes ? Hypermetabolic left infrahilar mass. Hypermetabolic nodule left lower lobe. Focal hypermetabolic activity within the rectum. ? Bronchoscopy 02/07/2018-no endobronchial lesions or abnormal secretions seen. Transbronchial brushings obtained from the left lower lobe nodule. Significant enlargement of the station 7, station 10 L, station 11 L nodes. Needle biopsies were taken from station 7 and 11 L nodes. ? Pathology from the left lower lobe brushings, a level 7 lymph node returned positive for adenocarcinoma, positive for cytokeratin 7 and CDX2, negative for cytokeratin 20 and TTF-1. ? Cycle 1 FOLFOX 02/21/2018 ? Cycle 2 FOLFOX 03/13/2018,Udenyca added ? Cycle 3 FOLFOX 03/28/2018 ? Cycle 4 FOLFOX 04/18/2018 (oxaliplatin held) 2.  Pulmonary fibrosis 3. Kidney stones 4. BPH 5. History of gastroesophageal reflux 6. Mild renal insufficiency 7. Port-A-Cath placement 02/14/2018 8. Neutropenia and thrombocytopenia secondary to  chemotherapy, Udenyca added with cycle 2 and oxaliplatin dose reduced 9. Tachycardia  Disposition: Austin Yu appears stable.  He has completed 3 cycles of FOLFOX.  Plan to proceed with cycle 4 today as scheduled.  Oxaliplatin will be held.  He will not require Udenyca with this cycle.  We reviewed the CBC from today.  Counts are adequate for treatment.  The white count is elevated.  He was started on a course of steroids yesterday for gout.  Following cycle 3 he was seen in the emergency department on 2 occasions for fever.  He was found to have a urinary tract infection, culture positive, treated with fosfomycin.  He has been afebrile for about 2 weeks.    He has persistent tachycardia of unclear etiology.  He was evaluated by cardiology earlier this week.  He is scheduled for a 2D echo.  We decided to hold Oxaliplatin today due to the persistent tachycardia.  There are rare reports of pneumonitis related to oxaliplatin.  We also discussed the possibility of a pulmonary embolus as the cause of tachycardia.  We are referring him for a chest CT to rule out PE.  He will return for lab, follow-up and chemotherapy in 2 weeks.  He will contact the office in the interim with any problems.  Patient seen with Dr. Benay Spice.  25 minutes were spent face-to-face at today's visit with the majority of that time involved in counseling/coordination of care.   [name] ANP/GNP-BC   04/18/2018  10:01 AM This was a shared visit with [name].  Austin Yu was interviewed and examined.  He has adenocarcinoma the rectum and adenocarcinoma involving a lung mass.  It remains unclear whether he has 2 separate primary cancers.  He is being treated with FOLFOX chemotherapy.  We are awaiting additional molecular testing from the lung mass and rectal tumor.  Austin Yu had a febrile illness several weeks ago.  He was diagnosed with a urinary tract infection, but it is unclear whether this explains his fever.  The  fever resolved after several days.  He reports no change in baseline exertional dyspnea.  He has been noted to have persistent tachycardia.  He has been referred to cardiology as scheduled for an echocardiogram today.  I decided to discontinue oxaliplatin from the chemotherapy regimen today while waiting on further evaluation of the tachycardia.  He could have uncommon oxalic platinum related pneumonitis.  I discussed the case with pulmonary medicine.  Austin Yu will be referred for a CT chest to rule out pulmonary embolism.  Julieanne Manson, MD

## 2018-04-19 ENCOUNTER — Telehealth: Payer: Self-pay | Admitting: Nurse Practitioner

## 2018-04-19 ENCOUNTER — Ambulatory Visit (HOSPITAL_COMMUNITY)
Admission: RE | Admit: 2018-04-19 | Discharge: 2018-04-19 | Disposition: A | Discharge status: Home / Self Care | Payer: PPO | Source: Ambulatory Visit | Attending: Nurse Practitioner | Admitting: Nurse Practitioner

## 2018-04-19 ENCOUNTER — Encounter (HOSPITAL_COMMUNITY): Payer: Self-pay

## 2018-04-19 ENCOUNTER — Other Ambulatory Visit: Payer: Self-pay | Admitting: Internal Medicine

## 2018-04-19 DIAGNOSIS — C20 Malignant neoplasm of rectum: Secondary | ICD-10-CM | POA: Insufficient documentation

## 2018-04-19 DIAGNOSIS — R05 Cough: Secondary | ICD-10-CM | POA: Diagnosis not present

## 2018-04-19 DIAGNOSIS — R509 Fever, unspecified: Secondary | ICD-10-CM | POA: Diagnosis not present

## 2018-04-19 DIAGNOSIS — R000 Tachycardia, unspecified: Secondary | ICD-10-CM | POA: Diagnosis not present

## 2018-04-19 MED ORDER — SODIUM CHLORIDE (PF) 0.9 % IJ SOLN
INTRAMUSCULAR | Status: AC
  Filled 2018-04-19: qty 50

## 2018-04-19 MED ORDER — IOHEXOL 350 MG/ML SOLN
100.0000 mL | Freq: Once | INTRAVENOUS | Status: AC | PRN
  Administered 2018-04-19: 09:00:00 79 mL via INTRAVENOUS

## 2018-04-19 NOTE — Telephone Encounter (Signed)
I notified Austin Yu the chest CT from earlier today is negative for PE.

## 2018-04-20 ENCOUNTER — Other Ambulatory Visit: Payer: Self-pay

## 2018-04-20 ENCOUNTER — Inpatient Hospital Stay: Payer: PPO

## 2018-04-20 VITALS — BP 144/77 | HR 101 | Temp 97.9°F | Resp 18

## 2018-04-20 DIAGNOSIS — C20 Malignant neoplasm of rectum: Secondary | ICD-10-CM

## 2018-04-20 MED ORDER — HEPARIN SOD (PORK) LOCK FLUSH 100 UNIT/ML IV SOLN
500.0000 [IU] | Freq: Once | INTRAVENOUS | Status: DC | PRN
  Filled 2018-04-20: qty 5

## 2018-04-20 MED ORDER — SODIUM CHLORIDE 0.9% FLUSH
10.0000 mL | INTRAVENOUS | Status: DC | PRN
  Filled 2018-04-20: qty 10

## 2018-04-22 DIAGNOSIS — C20 Malignant neoplasm of rectum: Secondary | ICD-10-CM | POA: Diagnosis not present

## 2018-04-23 ENCOUNTER — Other Ambulatory Visit: Payer: Self-pay

## 2018-04-23 ENCOUNTER — Ambulatory Visit (INDEPENDENT_AMBULATORY_CARE_PROVIDER_SITE_OTHER): Payer: PPO

## 2018-04-23 DIAGNOSIS — R000 Tachycardia, unspecified: Secondary | ICD-10-CM | POA: Diagnosis not present

## 2018-04-24 DIAGNOSIS — J8410 Pulmonary fibrosis, unspecified: Secondary | ICD-10-CM | POA: Diagnosis not present

## 2018-04-24 DIAGNOSIS — M109 Gout, unspecified: Secondary | ICD-10-CM | POA: Diagnosis not present

## 2018-04-24 DIAGNOSIS — C20 Malignant neoplasm of rectum: Secondary | ICD-10-CM | POA: Diagnosis not present

## 2018-04-24 DIAGNOSIS — N200 Calculus of kidney: Secondary | ICD-10-CM | POA: Diagnosis not present

## 2018-04-24 DIAGNOSIS — N289 Disorder of kidney and ureter, unspecified: Secondary | ICD-10-CM | POA: Diagnosis not present

## 2018-05-02 ENCOUNTER — Telehealth: Payer: Self-pay | Admitting: Nurse Practitioner

## 2018-05-02 ENCOUNTER — Inpatient Hospital Stay: Payer: PPO

## 2018-05-02 ENCOUNTER — Inpatient Hospital Stay (HOSPITAL_BASED_OUTPATIENT_CLINIC_OR_DEPARTMENT_OTHER): Payer: PPO | Admitting: Nurse Practitioner

## 2018-05-02 ENCOUNTER — Other Ambulatory Visit: Payer: Self-pay

## 2018-05-02 ENCOUNTER — Encounter: Payer: Self-pay | Admitting: Nurse Practitioner

## 2018-05-02 VITALS — BP 124/71 | HR 100 | Temp 97.7°F | Resp 18 | Ht 67.0 in | Wt 182.4 lb

## 2018-05-02 DIAGNOSIS — Z79899 Other long term (current) drug therapy: Secondary | ICD-10-CM | POA: Diagnosis not present

## 2018-05-02 DIAGNOSIS — J8410 Pulmonary fibrosis, unspecified: Secondary | ICD-10-CM

## 2018-05-02 DIAGNOSIS — Z95828 Presence of other vascular implants and grafts: Secondary | ICD-10-CM

## 2018-05-02 DIAGNOSIS — Z5111 Encounter for antineoplastic chemotherapy: Secondary | ICD-10-CM | POA: Diagnosis not present

## 2018-05-02 DIAGNOSIS — C20 Malignant neoplasm of rectum: Secondary | ICD-10-CM

## 2018-05-02 DIAGNOSIS — N289 Disorder of kidney and ureter, unspecified: Secondary | ICD-10-CM

## 2018-05-02 DIAGNOSIS — R000 Tachycardia, unspecified: Secondary | ICD-10-CM | POA: Diagnosis not present

## 2018-05-02 LAB — CMP (CANCER CENTER ONLY)
ALT: 21 U/L (ref 0–44)
AST: 21 U/L (ref 15–41)
Albumin: 2.9 g/dL — ABNORMAL LOW (ref 3.5–5.0)
Alkaline Phosphatase: 69 U/L (ref 38–126)
Anion gap: 10 (ref 5–15)
BUN: 21 mg/dL (ref 8–23)
CO2: 23 mmol/L (ref 22–32)
Calcium: 9 mg/dL (ref 8.9–10.3)
Chloride: 105 mmol/L (ref 98–111)
Creatinine: 1.3 mg/dL — ABNORMAL HIGH (ref 0.61–1.24)
GFR, Est AFR Am: >60 mL/min (ref >60)
GFR, Estimated: 55 mL/min — ABNORMAL LOW (ref >60)
Glucose, Bld: 137 mg/dL — ABNORMAL HIGH (ref 70–99)
Potassium: 4.2 mmol/L (ref 3.5–5.1)
Sodium: 138 mmol/L (ref 135–145)
Total Bilirubin: 0.6 mg/dL (ref 0.3–1.2)
Total Protein: 7.4 g/dL (ref 6.5–8.1)

## 2018-05-02 LAB — CBC WITH DIFFERENTIAL (CANCER CENTER ONLY)
Abs Immature Granulocytes: 0.02 10*3/uL (ref 0.00–0.07)
Basophils Absolute: 0.1 10*3/uL (ref 0.0–0.1)
Basophils Relative: 1 %
Eosinophils Absolute: 0.3 10*3/uL (ref 0.0–0.5)
Eosinophils Relative: 4 %
HCT: 44 % (ref 39.0–52.0)
Hemoglobin: 14.3 g/dL (ref 13.0–17.0)
Immature Granulocytes: 0 %
Lymphocytes Relative: 22 %
Lymphs Abs: 1.9 10*3/uL (ref 0.7–4.0)
MCH: 30.9 pg (ref 26.0–34.0)
MCHC: 32.5 g/dL (ref 30.0–36.0)
MCV: 95 fL (ref 80.0–100.0)
Monocytes Absolute: 0.7 10*3/uL (ref 0.1–1.0)
Monocytes Relative: 9 %
Neutro Abs: 5.4 10*3/uL (ref 1.7–7.7)
Neutrophils Relative %: 64 %
Platelet Count: 121 10*3/uL — ABNORMAL LOW (ref 150–400)
RBC: 4.63 MIL/uL (ref 4.22–5.81)
RDW: 16.8 % — ABNORMAL HIGH (ref 11.5–15.5)
WBC Count: 8.4 10*3/uL (ref 4.0–10.5)
nRBC: 0 % (ref 0.0–0.2)

## 2018-05-02 MED ORDER — DEXAMETHASONE SODIUM PHOSPHATE 10 MG/ML IJ SOLN
INTRAMUSCULAR | Status: AC
  Filled 2018-05-02: qty 1

## 2018-05-02 MED ORDER — SODIUM CHLORIDE 0.9% FLUSH
10.0000 mL | INTRAVENOUS | Status: DC | PRN
  Filled 2018-05-02: qty 10

## 2018-05-02 MED ORDER — DEXTROSE 5 % IV SOLN
Freq: Once | INTRAVENOUS | Status: AC
  Administered 2018-05-02: 11:00:00 via INTRAVENOUS
  Filled 2018-05-02: qty 250

## 2018-05-02 MED ORDER — SODIUM CHLORIDE 0.9% FLUSH
10.0000 mL | Freq: Once | INTRAVENOUS | Status: AC
  Administered 2018-05-02: 10 mL
  Filled 2018-05-02: qty 10

## 2018-05-02 MED ORDER — PALONOSETRON HCL INJECTION 0.25 MG/5ML
INTRAVENOUS | Status: AC
  Filled 2018-05-02: qty 5

## 2018-05-02 MED ORDER — PALONOSETRON HCL INJECTION 0.25 MG/5ML
0.2500 mg | Freq: Once | INTRAVENOUS | Status: AC
  Administered 2018-05-02: 0.25 mg via INTRAVENOUS

## 2018-05-02 MED ORDER — SODIUM CHLORIDE 0.9 % IV SOLN
2400.0000 mg/m2 | INTRAVENOUS | Status: DC
  Administered 2018-05-02: 15:00:00 4850 mg via INTRAVENOUS
  Filled 2018-05-02: qty 97

## 2018-05-02 MED ORDER — LEUCOVORIN CALCIUM INJECTION 350 MG
400.0000 mg/m2 | Freq: Once | INTRAVENOUS | Status: AC
  Administered 2018-05-02: 808 mg via INTRAVENOUS
  Filled 2018-05-02: qty 40.4

## 2018-05-02 MED ORDER — DEXAMETHASONE SODIUM PHOSPHATE 10 MG/ML IJ SOLN
10.0000 mg | Freq: Once | INTRAMUSCULAR | Status: AC
  Administered 2018-05-02: 10 mg via INTRAVENOUS

## 2018-05-02 MED ORDER — FLUOROURACIL CHEMO INJECTION 2.5 GM/50ML
400.0000 mg/m2 | Freq: Once | INTRAVENOUS | Status: AC
  Administered 2018-05-02: 14:00:00 800 mg via INTRAVENOUS
  Filled 2018-05-02: qty 16

## 2018-05-02 MED ORDER — OXALIPLATIN CHEMO INJECTION 100 MG/20ML
65.0000 mg/m2 | Freq: Once | INTRAVENOUS | Status: AC
  Administered 2018-05-02: 130 mg via INTRAVENOUS
  Filled 2018-05-02: qty 20

## 2018-05-02 NOTE — Telephone Encounter (Signed)
Scheduled appt per 4/23 los. °

## 2018-05-02 NOTE — Progress Notes (Addendum)
Austin Yu OFFICE PROGRESS NOTE   Diagnosis: Rectal cancer  INTERVAL HISTORY:   Mr. Sturtevant returns as scheduled.  He completed cycle 4 FOLFOX 04/18/2018.  Oxaliplatin was held.  He denies nausea/vomiting.  No mouth sores.  No diarrhea.  No hand or foot pain or redness.  He denies bleeding.  Stable baseline dyspnea on exertion.  No significant cough.  No fever.  Objective:  Vital signs in last 24 hours:  Blood pressure 124/71, pulse (!) 106, temperature 97.7 F (36.5 C), temperature source Oral, resp. rate 18, height 5' 7" (1.702 m), weight 182 lb 6.4 oz (82.7 kg), SpO2 96 %.    HEENT: No thrush or ulcers. Resp: Respirations even, unlabored. Vascular: No leg edema.  Calves soft and nontender.  Skin: Palms without erythema. Port-A-Cath without erythema.  Lab Results:  Lab Results  Component Value Date   WBC 8.4 05/02/2018   HGB 14.3 05/02/2018   HCT 44.0 05/02/2018   MCV 95.0 05/02/2018   PLT 121 (L) 05/02/2018   NEUTROABS 5.4 05/02/2018    Imaging:  No results found.  Medications: I have reviewed the patient's current medications.  Assessment/Plan: 1. Rectal cancer ? Distal rectal mass noted on colonoscopy 01/21/2018, biopsy confirmed invasive adenocarcinoma, cytokeratin 20 (patchy) and CDX 2+, cytokeratin 7 and TTF-1 negative. PIK3CA, APC, TP53 detected. ? CTs 01/29/2018-new left infrahilar mass, enlarged left hilar lymph node, mild subcarinal lymphadenopathy, confluent ill-defined lesion in area fibrosis at the posterior left lower lobe measuring 2 x 1.5 cm, rectal mass, perirectal lymph nodes ? MRI 01/28/2018-T3bN1tumor located at 6.6 cm from the internal anal sphincter, 2 mesorectal lymph nodes ? Hypermetabolic left infrahilar mass. Hypermetabolic nodule left lower lobe. Focal hypermetabolic activity within the rectum. ? Bronchoscopy 02/07/2018-no endobronchial lesions or abnormal secretions seen. Transbronchial brushings obtained from the left  lower lobe nodule. Significant enlargement of the station 7, station 10 L, station 11 L nodes. Needle biopsies were taken from station 7 and 11 L nodes. ? Pathology from the left lower lobe brushings, a level 7 lymph node returned positive for adenocarcinoma, positive for cytokeratin 7 and CDX2, negative for cytokeratin 20 and TTF-1.  Fine-needle aspiration EBUS, 7, B with malignant cells consistent with adenocarcinoma; CCND1 (amp) detected. ? Cycle 1 FOLFOX 02/21/2018 ? Cycle 2 FOLFOX 03/13/2018,Udenyca added ? Cycle 3 FOLFOX 03/28/2018 ? Cycle 4 FOLFOX 04/18/2018 (oxaliplatin held) ? CT chest 04/19/2018- left infrahilar node previously measured 2.6 x 3.4, current 1.8 x 2.0.  11 mm paraesophageal lymph node, previously 14 mm. ? Cycle 5 FOLFOX 05/02/2018 2. Pulmonary fibrosis 3. Kidney stones 4. BPH 5. History of gastroesophageal reflux 6. Mild renal insufficiency 7. Port-A-Cath placement 02/14/2018 8. Neutropenia and thrombocytopenia secondary to chemotherapy, Udenyca added with cycle 2 and oxaliplatin dose reduced 9. Tachycardia-chest CT 04/19/2018 negative for pulmonary embolus; underlying fibrotic lung disease again noted; slight increase patchy groundglass attenuation right upper lobe.  Echocardiogram 04/23/2018- calculated EF 64%.  Tachycardia improved 05/02/2018  Disposition: Mr. Tavenner appears stable.  He has completed 4 cycles of FOLFOX.  He overall is tolerating treatment well.  Chest CT after cycle 4 showed left infrahilar and paraesophageal lymph nodes are smaller.  Plan to proceed with cycle 5 today as scheduled.  He will undergo restaging CTs after cycle 6.  We reviewed the CBC from today.  Counts are adequate for treatment.  He will return for lab, follow-up and cycle 6 FOLFOX in 2 weeks.  He will contact the office in the interim with any  problems.  Patient seen with Dr. Benay Spice.  25 minutes were spent face-to-face at today's visit with the majority of that time involved in  counseling/coordination of care.    [name] ANP/GNP-BC   05/02/2018  9:47 AM This was a shared visit with [name].  Mr. Abarca appears well.  We reviewed the chest CT results with him.  He appears to have had an infection when he presented with a fever last month.  The plan is to resume oxaliplatin chemotherapy today.  We reviewed the results of repeat molecular testing.  The lung and rectal tumors have a different mutation pattern suggesting the possibility of separate primary tumors.  The plan is to schedule restaging CTs after cycle 6 FOLFOX and then decide on local therapies.  Julieanne Manson, MD

## 2018-05-04 ENCOUNTER — Other Ambulatory Visit: Payer: Self-pay

## 2018-05-04 ENCOUNTER — Inpatient Hospital Stay: Payer: PPO

## 2018-05-04 VITALS — BP 98/64 | HR 92 | Temp 97.7°F | Resp 18

## 2018-05-04 DIAGNOSIS — Z5111 Encounter for antineoplastic chemotherapy: Secondary | ICD-10-CM | POA: Diagnosis not present

## 2018-05-04 MED ORDER — PEGFILGRASTIM-CBQV 6 MG/0.6ML ~~LOC~~ SOSY
6.0000 mg | PREFILLED_SYRINGE | Freq: Once | SUBCUTANEOUS | Status: AC
  Administered 2018-05-04: 6 mg via SUBCUTANEOUS

## 2018-05-04 MED ORDER — HEPARIN SOD (PORK) LOCK FLUSH 100 UNIT/ML IV SOLN
500.0000 [IU] | Freq: Once | INTRAVENOUS | Status: AC | PRN
  Administered 2018-05-04: 500 [IU]
  Filled 2018-05-04: qty 5

## 2018-05-04 MED ORDER — PEGFILGRASTIM-CBQV 6 MG/0.6ML ~~LOC~~ SOSY
PREFILLED_SYRINGE | SUBCUTANEOUS | Status: AC
  Filled 2018-05-04: qty 0.6

## 2018-05-04 MED ORDER — SODIUM CHLORIDE 0.9% FLUSH
10.0000 mL | INTRAVENOUS | Status: DC | PRN
  Administered 2018-05-04: 10 mL
  Filled 2018-05-04: qty 10

## 2018-05-07 ENCOUNTER — Encounter: Payer: Self-pay | Admitting: Cardiology

## 2018-05-07 ENCOUNTER — Ambulatory Visit: Payer: PPO | Admitting: Cardiology

## 2018-05-07 ENCOUNTER — Other Ambulatory Visit: Payer: Self-pay

## 2018-05-07 VITALS — BP 108/67 | HR 82 | Ht 67.0 in | Wt 183.0 lb

## 2018-05-07 DIAGNOSIS — R000 Tachycardia, unspecified: Secondary | ICD-10-CM | POA: Diagnosis not present

## 2018-05-07 NOTE — Progress Notes (Signed)
Virtual Visit via Video Note   Subjective:   Austin Yu, male    DOB: 1945/05/27, 73 y.o.   MRN: [id number]   I connected with the patient on 05/07/18 by a video enabled telemedicine application and verified that I am speaking with the correct person using two identifiers.     I discussed the limitations of evaluation and management by telemedicine and the availability of in person appointments. The patient expressed understanding and agreed to proceed.   This visit type was conducted due to national recommendations for restrictions regarding the COVID-19 Pandemic (e.g. social distancing).  This format is felt to be most appropriate for this patient at this time.  All issues noted in this document were discussed and addressed.  No physical exam was performed (except for noted visual exam findings with Tele health visits).  The patient has consented to conduct a Tele health visit and understands insurance will be billed.     Chief complaint:  Tachycardia   HPI  73 y.o. Caucasian male with with pulmonary fibrosis, rectal cancer on FOLFOX chemotherapy, nephrolithiasis, referred for evaluation of tachycardia.    As a part of persistent tachycardia workup, I obtained an echocardiogram that showed structurally normal heart with normal EF.   His heart rate has improved and back to normal.   Past Medical History:  Diagnosis Date  . Abnormal chest sounds   . BPH (benign prostatic hypertrophy)   . Cancer [facility])    rectum cancer; dx 01/21/2018 by Dr. Loletha Carrow by colonoscopy  . Complication of anesthesia    ISSUE W/ EXTUBATION 05-18-2015 IN [facility] -- REFER TO ANESTHESIA NOTE''S  . Elevated PSA   . GERD (gastroesophageal reflux disease)   . History of airway aspiration   . History of kidney stones   . Hyperlipidemia   . Hyperplasia of prostate without lower urinary tract symptoms (LUTS)   . Left ureteral stone   . Lung nodule   . Nephrolithiasis   . Postinflammatory pulmonary fibrosis  (HCC)    PULMOLOGIST-  DR ERDE-  IDIOPATHIC   . Pulmonary fibrosis ([place])   . Strawberry hemangioma of skin   . Upper airway cough syndrome    PULMOLOGIST-  DR YCXK     Past Surgical History:  Procedure Laterality Date  . BIOPSY  01/21/2018   Procedure: BIOPSY;  Surgeon: Doran Stabler, MD;  Location: [facility];  Service: Gastroenterology;;  . COLONOSCOPY WITH PROPOFOL N/A 01/21/2018   Procedure: COLONOSCOPY WITH PROPOFOL;  Surgeon: Doran Stabler, MD;  Location: [facility];  Service: Gastroenterology;  Laterality: N/A;  . CYSTOSCOPY WITH HOLMIUM LASER LITHOTRIPSY Left 06/01/2015   Procedure: CYSTOSCOPY WITH HOLMIUM LASER LITHOTRIPSY;  Surgeon: Irine Seal, MD;  Location: [facility];  Service: Urology;  Laterality: Left;  . CYSTOSCOPY WITH URETEROSCOPY, STONE BASKETRY AND STENT PLACEMENT Left 05/18/2015   Procedure: CYSTOSCOPY WITH STENT PLACEMENT AND RETROGRADE PYELOGRAM;  Surgeon: Irine Seal, MD;  Location: [facility];  Service: Urology;  Laterality: Left;  . CYSTOSCOPY WITH URETEROSCOPY, STONE BASKETRY AND STENT PLACEMENT Left 06/01/2015   Procedure: CYSTOSCOPY WITH URETEROSCOPY, STONE BASKETRY AND STENT PLACEMENT;  Surgeon: Irine Seal, MD;  Location[facility];  Service: Urology;  Laterality: Left;  . EXTRACORPOREAL SHOCK WAVE LITHOTRIPSY  2007  . IR IMAGING GUIDED PORT INSERTION  02/14/2018  . TONSILLECTOMY  age 42-4  . VIDEO BRONCHOSCOPY WITH ENDOBRONCHIAL NAVIGATION Left 02/07/2018   Procedure: VIDEO BRONCHOSCOPY WITH ENDOBRONCHIAL NAVIGATION;  Surgeon: Lamonte Sakai,  Rose Fillers, MD;  Location: [place];  Service: Thoracic;  Laterality: Left;  Marland Kitchen VIDEO BRONCHOSCOPY WITH ENDOBRONCHIAL ULTRASOUND Left 02/07/2018   Procedure: VIDEO BRONCHOSCOPY WITH ENDOBRONCHIAL ULTRASOUND;  Surgeon: Collene Gobble, MD;  Location: [facility];  Service: Thoracic;  Laterality: Left;     Social History   Socioeconomic History  . Marital status: Widowed    Spouse name: Not on file  .  Number of children: 2  . Years of education: Not on file  . Highest education level: Not on file  Occupational History  . Occupation: Retired  Scientific laboratory technician  . Financial resource strain: Not on file  . Food insecurity:    Worry: Not on file    Inability: Not on file  . Transportation needs:    Medical: No    Non-medical: No  Tobacco Use  . Smoking status: Never Smoker  . Smokeless tobacco: Never Used  Substance and Sexual Activity  . Alcohol use: Yes    Alcohol/week: 0.0 standard drinks    Comment: occasional  . Drug use: No  . Sexual activity: Not on file  Lifestyle  . Physical activity:    Days per week: Not on file    Minutes per session: Not on file  . Stress: Not on file  Relationships  . Social connections:    Talks on phone: Not on file    Gets together: Not on file    Attends religious service: Not on file    Active member of club or organization: Not on file    Attends meetings of clubs or organizations: Not on file    Relationship status: Not on file  . Intimate partner violence:    Fear of current or ex partner: Not on file    Emotionally abused: Not on file    Physically abused: Not on file    Forced sexual activity: Not on file  Other Topics Concern  . Not on file  Social History Narrative  . Not on file     Current Outpatient Medications on File Prior to Visit  Medication Sig Dispense Refill  . acetaminophen (TYLENOL) 325 MG tablet Take 650 mg by mouth every 6 (six) hours as needed for moderate pain.    . famotidine (PEPCID) 20 MG tablet Take 1 tablet (20 mg total) by mouth at bedtime. 90 tablet 3  . lidocaine-prilocaine (EMLA) cream Apply to Port-A-Cath 1 to 2 hours prior to use 30 g 2  . pantoprazole (PROTONIX) 40 MG tablet Take 1 tablet (40 mg total) by mouth daily. 30 tablet 5  . prochlorperazine (COMPAZINE) 10 MG tablet Take 1 tablet (10 mg total) by mouth every 6 (six) hours as needed for nausea or vomiting. 30 tablet 2  . tamsulosin (FLOMAX)  0.4 MG CAPS capsule Take 0.4 mg by mouth at bedtime.      No current facility-administered medications on file prior to visit.     Cardiovascular studies:  EKG 04/16/2018: Sinus tachycardia 114 bpm. Borderline left axis deviation, poor R wave progression.  CXR 04/04/2018: Superimposed on chronic interstitial lung disease/pulmonary fibrosis are faint areas of ground-glass opacity which may represent atypical pneumonia possibly viral. It should be noted however that the patient did have ground-glass opacities at least in the left lower lobe on prior chest CT from January and findings could be related to interstitial possibly UIP given the fibrosis.  EKG 04/16/2018: Sinus tachycardia 114 bpm. Borderline left axis deviation, poor R wave progression.  Recent labs: 04/10/2018:  Glucose 92. BUN/Cr 18/1.1. eGFR 67. Na/K 144/4.2 WBC 8.6. H/H 16/49. MCV 96. Platelets 213. Rest of the CMP normal.   Results for ABDIAZIZ, KLAHN" (MRN [id number]) as of 04/16/2018 12:17  Ref. Range 04/04/2018 21:23  COMPREHENSIVE METABOLIC PANEL Unknown Rpt (A)  Sodium Latest Ref Range: 135 - 145 mmol/L 133 (L)  Potassium Latest Ref Range: 3.5 - 5.1 mmol/L 4.1  Chloride Latest Ref Range: 98 - 111 mmol/L 102  CO2 Latest Ref Range: 22 - 32 mmol/L 22  Glucose Latest Ref Range: 70 - 99 mg/dL 135 (H)  BUN Latest Ref Range: 8 - 23 mg/dL 26 (H)  Creatinine Latest Ref Range: 0.61 - 1.24 mg/dL 1.27 (H)  Calcium Latest Ref Range: 8.9 - 10.3 mg/dL 8.4 (L)  Anion gap Latest Ref Range: 5 - 15  9  Alkaline Phosphatase Latest Ref Range: 38 - 126 U/L 71  Albumin Latest Ref Range: 3.5 - 5.0 g/dL 2.9 (L)  AST Latest Ref Range: 15 - 41 U/L 28  ALT Latest Ref Range: 0 - 44 U/L 16  Total Protein Latest Ref Range: 6.5 - 8.1 g/dL 7.4  Total Bilirubin Latest Ref Range: 0.3 - 1.2 mg/dL 1.0  GFR, Est Non African American Latest Ref Range: >60 mL/min 56 (L)  GFR, Est African American Latest Ref Range: >60 mL/min >60  Lactic  Acid, Venous Latest Ref Range: 0.5 - 1.9 mmol/L 1.1   Results for KEYONDRE, HEPBURN" (MRN [id number]) as of 04/16/2018 12:17  Ref. Range 04/04/2018 21:23  WBC Latest Ref Range: 4.0 - 10.5 K/uL 4.8  RBC Latest Ref Range: 4.22 - 5.81 MIL/uL 4.49  Hemoglobin Latest Ref Range: 13.0 - 17.0 g/dL 13.8  HCT Latest Ref Range: 39.0 - 52.0 % 41.2  MCV Latest Ref Range: 80.0 - 100.0 fL 91.8  MCH Latest Ref Range: 26.0 - 34.0 pg 30.7  MCHC Latest Ref Range: 30.0 - 36.0 g/dL 33.5  RDW Latest Ref Range: 11.5 - 15.5 % 14.8  Platelets Latest Ref Range: 150 - 400 K/uL 119 (L)  nRBC Latest Ref Range: 0.0 - 0.2 % 0.0    Review of Systems  Constitution: Negative for decreased appetite, malaise/fatigue, weight gain and weight loss.  HENT: Negative for congestion.   Eyes: Negative for visual disturbance.  Cardiovascular: Positive for palpitations. Negative for chest pain, dyspnea on exertion (Mild, stable), leg swelling and syncope.  Respiratory: Positive for shortness of breath.   Endocrine: Negative for cold intolerance.  Hematologic/Lymphatic: Does not bruise/bleed easily.  Skin: Negative for itching and rash.  Musculoskeletal: Negative for myalgias.  Gastrointestinal: Negative for abdominal pain, nausea and vomiting.  Genitourinary: Negative for dysuria.  Neurological: Negative for dizziness and weakness.  Psychiatric/Behavioral: The patient is not nervous/anxious.   All other systems reviewed and are negative.        Vitals:   05/07/18 1334  BP: 108/67  Pulse: 82    Observation/findings during video visit   Objective:   Physical Exam  Constitutional: He is oriented to person, place, and time. He appears well-developed and well-nourished. No distress.  Neck: No JVD present.  Pulmonary/Chest: Effort normal.  Musculoskeletal:        General: No edema.  Neurological: He is alert and oriented to person, place, and time.  Psychiatric: He has a normal mood and affect.  Nursing note and  vitals reviewed.       Assessment & Recommendations:   73 y.o. Caucasian male with with pulmonary fibrosis, rectal cancer  on FOLFOX chemotherapy, nephrolithiasis, referred for evaluation of tachycardia.    1. Tachycardia Resolved. Echocardiogram is reassuring and rules out cardiomyopathy  2. Rectal cancer ([place]) On Folfox chemotherapy.  3. IPF (idiopathic pulmonary fibrosis) (HCC) At baseline.  I will see him on as needed basis.  Nigel Mormon, MD [facility] Pager: [phone] Office: [phone] If no answer Cell [phone]

## 2018-05-12 ENCOUNTER — Other Ambulatory Visit: Payer: Self-pay | Admitting: Oncology

## 2018-05-15 ENCOUNTER — Other Ambulatory Visit: Payer: Self-pay

## 2018-05-15 DIAGNOSIS — C20 Malignant neoplasm of rectum: Secondary | ICD-10-CM

## 2018-05-16 ENCOUNTER — Other Ambulatory Visit: Payer: Self-pay

## 2018-05-16 ENCOUNTER — Inpatient Hospital Stay: Payer: PPO | Attending: Nurse Practitioner

## 2018-05-16 ENCOUNTER — Telehealth: Payer: Self-pay | Admitting: Oncology

## 2018-05-16 ENCOUNTER — Inpatient Hospital Stay (HOSPITAL_BASED_OUTPATIENT_CLINIC_OR_DEPARTMENT_OTHER): Payer: PPO | Admitting: Oncology

## 2018-05-16 ENCOUNTER — Inpatient Hospital Stay: Payer: PPO

## 2018-05-16 VITALS — BP 121/75 | HR 111 | Temp 98.2°F | Resp 18 | Ht 67.0 in | Wt 181.7 lb

## 2018-05-16 VITALS — HR 99

## 2018-05-16 DIAGNOSIS — Z79899 Other long term (current) drug therapy: Secondary | ICD-10-CM

## 2018-05-16 DIAGNOSIS — N289 Disorder of kidney and ureter, unspecified: Secondary | ICD-10-CM | POA: Diagnosis not present

## 2018-05-16 DIAGNOSIS — C20 Malignant neoplasm of rectum: Secondary | ICD-10-CM

## 2018-05-16 DIAGNOSIS — R0600 Dyspnea, unspecified: Secondary | ICD-10-CM | POA: Diagnosis not present

## 2018-05-16 DIAGNOSIS — Z5189 Encounter for other specified aftercare: Secondary | ICD-10-CM | POA: Diagnosis not present

## 2018-05-16 DIAGNOSIS — D6959 Other secondary thrombocytopenia: Secondary | ICD-10-CM | POA: Diagnosis not present

## 2018-05-16 DIAGNOSIS — J8410 Pulmonary fibrosis, unspecified: Secondary | ICD-10-CM | POA: Diagnosis not present

## 2018-05-16 DIAGNOSIS — Z5111 Encounter for antineoplastic chemotherapy: Secondary | ICD-10-CM | POA: Diagnosis not present

## 2018-05-16 DIAGNOSIS — Z95828 Presence of other vascular implants and grafts: Secondary | ICD-10-CM

## 2018-05-16 LAB — CBC WITH DIFFERENTIAL (CANCER CENTER ONLY)
Abs Immature Granulocytes: 0.2 10*3/uL — ABNORMAL HIGH (ref 0.00–0.07)
Basophils Absolute: 0 10*3/uL (ref 0.0–0.1)
Basophils Relative: 1 %
Eosinophils Absolute: 0.1 10*3/uL (ref 0.0–0.5)
Eosinophils Relative: 1 %
HCT: 41.9 % (ref 39.0–52.0)
Hemoglobin: 13.8 g/dL (ref 13.0–17.0)
Immature Granulocytes: 2 %
Lymphocytes Relative: 29 %
Lymphs Abs: 2.5 10*3/uL (ref 0.7–4.0)
MCH: 32 pg (ref 26.0–34.0)
MCHC: 32.9 g/dL (ref 30.0–36.0)
MCV: 97.2 fL (ref 80.0–100.0)
Monocytes Absolute: 0.7 10*3/uL (ref 0.1–1.0)
Monocytes Relative: 8 %
Neutro Abs: 5.1 10*3/uL (ref 1.7–7.7)
Neutrophils Relative %: 59 %
Platelet Count: 88 10*3/uL — ABNORMAL LOW (ref 150–400)
RBC: 4.31 MIL/uL (ref 4.22–5.81)
RDW: 17.2 % — ABNORMAL HIGH (ref 11.5–15.5)
WBC Count: 8.7 10*3/uL (ref 4.0–10.5)
nRBC: 0 % (ref 0.0–0.2)

## 2018-05-16 LAB — CMP (CANCER CENTER ONLY)
ALT: 26 U/L (ref 0–44)
AST: 27 U/L (ref 15–41)
Albumin: 3.1 g/dL — ABNORMAL LOW (ref 3.5–5.0)
Alkaline Phosphatase: 105 U/L (ref 38–126)
Anion gap: 11 (ref 5–15)
BUN: 16 mg/dL (ref 8–23)
CO2: 23 mmol/L (ref 22–32)
Calcium: 8.9 mg/dL (ref 8.9–10.3)
Chloride: 106 mmol/L (ref 98–111)
Creatinine: 1.17 mg/dL (ref 0.61–1.24)
GFR, Est AFR Am: >60 mL/min (ref >60)
GFR, Estimated: >60 mL/min (ref >60)
Glucose, Bld: 134 mg/dL — ABNORMAL HIGH (ref 70–99)
Potassium: 3.7 mmol/L (ref 3.5–5.1)
Sodium: 140 mmol/L (ref 135–145)
Total Bilirubin: 0.3 mg/dL (ref 0.3–1.2)
Total Protein: 7 g/dL (ref 6.5–8.1)

## 2018-05-16 MED ORDER — DEXAMETHASONE SODIUM PHOSPHATE 10 MG/ML IJ SOLN
INTRAMUSCULAR | Status: AC
  Filled 2018-05-16: qty 1

## 2018-05-16 MED ORDER — FLUOROURACIL CHEMO INJECTION 2.5 GM/50ML
400.0000 mg/m2 | Freq: Once | INTRAVENOUS | Status: AC
  Administered 2018-05-16: 13:00:00 800 mg via INTRAVENOUS
  Filled 2018-05-16: qty 16

## 2018-05-16 MED ORDER — SODIUM CHLORIDE 0.9% FLUSH
10.0000 mL | INTRAVENOUS | Status: DC | PRN
  Filled 2018-05-16: qty 10

## 2018-05-16 MED ORDER — DEXTROSE 5 % IV SOLN
Freq: Once | INTRAVENOUS | Status: AC
  Administered 2018-05-16: 10:00:00 via INTRAVENOUS
  Filled 2018-05-16: qty 250

## 2018-05-16 MED ORDER — SODIUM CHLORIDE 0.9% FLUSH
10.0000 mL | Freq: Once | INTRAVENOUS | Status: AC
  Administered 2018-05-16: 10 mL
  Filled 2018-05-16: qty 10

## 2018-05-16 MED ORDER — PALONOSETRON HCL INJECTION 0.25 MG/5ML
INTRAVENOUS | Status: AC
  Filled 2018-05-16: qty 5

## 2018-05-16 MED ORDER — SODIUM CHLORIDE 0.9 % IV SOLN
2400.0000 mg/m2 | INTRAVENOUS | Status: DC
  Administered 2018-05-16: 4850 mg via INTRAVENOUS
  Filled 2018-05-16: qty 97

## 2018-05-16 MED ORDER — HEPARIN SOD (PORK) LOCK FLUSH 100 UNIT/ML IV SOLN
500.0000 [IU] | Freq: Once | INTRAVENOUS | Status: DC | PRN
  Filled 2018-05-16: qty 5

## 2018-05-16 MED ORDER — PALONOSETRON HCL INJECTION 0.25 MG/5ML
0.2500 mg | Freq: Once | INTRAVENOUS | Status: AC
  Administered 2018-05-16: 0.25 mg via INTRAVENOUS

## 2018-05-16 MED ORDER — LEUCOVORIN CALCIUM INJECTION 350 MG
400.0000 mg/m2 | Freq: Once | INTRAVENOUS | Status: AC
  Administered 2018-05-16: 808 mg via INTRAVENOUS
  Filled 2018-05-16: qty 40.4

## 2018-05-16 MED ORDER — OXALIPLATIN CHEMO INJECTION 100 MG/20ML
65.0000 mg/m2 | Freq: Once | INTRAVENOUS | Status: AC
  Administered 2018-05-16: 130 mg via INTRAVENOUS
  Filled 2018-05-16: qty 20

## 2018-05-16 MED ORDER — DEXAMETHASONE SODIUM PHOSPHATE 10 MG/ML IJ SOLN
10.0000 mg | Freq: Once | INTRAMUSCULAR | Status: AC
  Administered 2018-05-16: 10 mg via INTRAVENOUS

## 2018-05-16 NOTE — Patient Instructions (Signed)

## 2018-05-16 NOTE — Telephone Encounter (Signed)
Scheduled appt per 5/7 los.  Added treatment in the book for approval for 5/20 or 5/21.

## 2018-05-16 NOTE — Progress Notes (Signed)
[place] OFFICE PROGRESS NOTE   Diagnosis: Rectal cancer  INTERVAL HISTORY:   Austin Yu returns as scheduled.  He completed a another cycle of FOLFOX 05/02/2018.  He had cold sensitivity for 2 to 3 days following chemotherapy.  This has resolved.  No neuropathy symptoms are present.  No nausea/vomiting, mouth sores, or diarrhea.  No fever.  Stable exertional dyspnea.  No new complaint.  Objective:  Vital signs in last 24 hours:  Blood pressure 121/75, pulse (!) 111, temperature 98.2 F (36.8 C), temperature source Oral, resp. rate 18, height _0  (1.702 m), weight 181 lb 11.2 oz (82.4 kg), SpO2 95 %.    HEENT: No thrush or ulcers Resp: Diffuse inspiratory rales, most prominent at the lower posterior lung fields, no respiratory distress Cardio: Regular rate and rhythm Vascular: No leg edema  Skin: Palms without erythema  Portacath/PICC-without erythema  Lab Results:  Lab Results  Component Value Date   WBC 8.7 05/16/2018   HGB 13.8 05/16/2018   HCT 41.9 05/16/2018   MCV 97.2 05/16/2018   PLT 88 (L) 05/16/2018   NEUTROABS 5.1 05/16/2018    CMP  Lab Results  Component Value Date   NA 138 05/02/2018   K 4.2 05/02/2018   CL 105 05/02/2018   CO2 23 05/02/2018   GLUCOSE 137 (H) 05/02/2018   BUN 21 05/02/2018   CREATININE 1.30 (H) 05/02/2018   CALCIUM 9.0 05/02/2018   PROT 7.4 05/02/2018   ALBUMIN 2.9 (L) 05/02/2018   AST 21 05/02/2018   ALT 21 05/02/2018   ALKPHOS 69 05/02/2018   BILITOT 0.6 05/02/2018   GFRNONAA 55 (L) 05/02/2018   GFRAA >60 05/02/2018    Lab Results  Component Value Date   CEA1 21.75 (H) 02/13/2018     Medications: I have reviewed the patient's current medications.   Assessment/Plan: 1. Rectal cancer ? Distal rectal mass noted on colonoscopy 01/21/2018, biopsy confirmed invasive adenocarcinoma, cytokeratin 20 (patchy) and CDX 2+, cytokeratin 7 and TTF-1 negative. PIK3CA, APC, TP53 detected. ? CTs 01/29/2018-new left  infrahilar mass, enlarged left hilar lymph node, mild subcarinal lymphadenopathy, confluent ill-defined lesion in area fibrosis at the posterior left lower lobe measuring 2 x 1.5 cm, rectal mass, perirectal lymph nodes ? MRI 01/28/2018-T3bN1tumor located at 6.6 cm from the internal anal sphincter, 2 mesorectal lymph nodes ? Hypermetabolic left infrahilar mass. Hypermetabolic nodule left lower lobe. Focal hypermetabolic activity within the rectum. ? Bronchoscopy 02/07/2018-no endobronchial lesions or abnormal secretions seen. Transbronchial brushings obtained from the left lower lobe nodule. Significant enlargement of the station 7, station 10 L, station 11 L nodes. Needle biopsies were taken from station 7 and 11 L nodes. ? Pathology from the left lower lobe brushings, a level 7 lymph node returned positive for adenocarcinoma, positive for cytokeratin 7 and CDX2, negative for cytokeratin 20 and TTF-1.  Fine-needle aspiration EBUS, 7, B with malignant cells consistent with adenocarcinoma; CCND1 (amp) detected. ? Cycle 1 FOLFOX 02/21/2018 ? Cycle 2 FOLFOX 03/13/2018,Udenyca added ? Cycle 3 FOLFOX 03/28/2018 ? Cycle 4 FOLFOX 04/18/2018 (oxaliplatin held) ? CT chest 04/19/2018- left infrahilar node previously measured 2.6 x 3.4, current 1.8 x 2.0.  11 mm paraesophageal lymph node, previously 14 mm. ? Cycle 5 FOLFOX 05/02/2018 ? Cycle 6 FOLFOX 05/16/2018 2. Pulmonary fibrosis 3. Kidney stones 4. BPH 5. History of gastroesophageal reflux 6. Mild renal insufficiency 7. Port-A-Cath placement 02/14/2018 8. Neutropenia and thrombocytopenia secondary to chemotherapy, Udenyca added with cycle 2 and oxaliplatin dose reduced 9.  Tachycardia-chest CT 04/19/2018 negative for pulmonary embolus; underlying fibrotic lung disease again noted; slight increase patchy groundglass attenuation right upper lobe.  Echocardiogram 04/23/2018- calculated EF 64%.  Tachycardia improved 05/02/2018    Disposition: Austin Yu appears  unchanged.  He will complete another cycle of FOLFOX today.  He has mild thrombocytopenia.  He will contact us for spontaneous bleeding or bruising.  The oxaliplatin has been dose reduced.  He will undergo restaging CTs after this cycle.  I will present his case at the GI tumor conference to decide on continuing neoadjuvant therapy for rectal cancer and management of the lung tumor.  Betsy Coder, MD  05/16/2018  9:11 AM

## 2018-05-16 NOTE — Progress Notes (Signed)
Per Dr. Stevphen Meuse to treat w/platelet count 88

## 2018-05-16 NOTE — Progress Notes (Signed)
Continue same Oxaliplatin dose today despite thrombocytopenia.   Hardie Pulley, PharmD, BCPS, BCOP

## 2018-05-18 ENCOUNTER — Other Ambulatory Visit: Payer: Self-pay

## 2018-05-18 ENCOUNTER — Inpatient Hospital Stay: Payer: PPO

## 2018-05-18 VITALS — BP 103/73 | HR 109 | Temp 98.9°F | Resp 16

## 2018-05-18 DIAGNOSIS — Z5111 Encounter for antineoplastic chemotherapy: Secondary | ICD-10-CM | POA: Diagnosis not present

## 2018-05-18 DIAGNOSIS — C20 Malignant neoplasm of rectum: Secondary | ICD-10-CM

## 2018-05-18 MED ORDER — PEGFILGRASTIM-CBQV 6 MG/0.6ML ~~LOC~~ SOSY
PREFILLED_SYRINGE | SUBCUTANEOUS | Status: AC
  Filled 2018-05-18: qty 0.6

## 2018-05-18 MED ORDER — PEGFILGRASTIM-CBQV 6 MG/0.6ML ~~LOC~~ SOSY
6.0000 mg | PREFILLED_SYRINGE | Freq: Once | SUBCUTANEOUS | Status: AC
  Administered 2018-05-18: 11:00:00 6 mg via SUBCUTANEOUS

## 2018-05-18 MED ORDER — SODIUM CHLORIDE 0.9% FLUSH
10.0000 mL | INTRAVENOUS | Status: DC | PRN
  Administered 2018-05-18: 10 mL
  Filled 2018-05-18: qty 10

## 2018-05-18 MED ORDER — HEPARIN SOD (PORK) LOCK FLUSH 100 UNIT/ML IV SOLN
500.0000 [IU] | Freq: Once | INTRAVENOUS | Status: AC | PRN
  Administered 2018-05-18: 500 [IU]
  Filled 2018-05-18: qty 5

## 2018-05-21 ENCOUNTER — Telehealth: Payer: Self-pay | Admitting: Oncology

## 2018-05-21 NOTE — Telephone Encounter (Signed)
Called to inform patient that the treatment appt has been added and patient is okay with decoupling his appts.  Patient aware of appt date and time.

## 2018-05-26 ENCOUNTER — Other Ambulatory Visit: Payer: Self-pay | Admitting: Oncology

## 2018-05-28 ENCOUNTER — Other Ambulatory Visit: Payer: Self-pay

## 2018-05-28 ENCOUNTER — Ambulatory Visit (HOSPITAL_COMMUNITY)
Admission: RE | Admit: 2018-05-28 | Discharge: 2018-05-28 | Disposition: A | Discharge status: Home / Self Care | Payer: PPO | Source: Ambulatory Visit | Attending: Oncology | Admitting: Oncology

## 2018-05-28 ENCOUNTER — Encounter (HOSPITAL_COMMUNITY): Payer: Self-pay

## 2018-05-28 DIAGNOSIS — R911 Solitary pulmonary nodule: Secondary | ICD-10-CM | POA: Diagnosis not present

## 2018-05-28 DIAGNOSIS — C20 Malignant neoplasm of rectum: Secondary | ICD-10-CM | POA: Diagnosis not present

## 2018-05-28 MED ORDER — IOHEXOL 300 MG/ML  SOLN
100.0000 mL | Freq: Once | INTRAMUSCULAR | Status: AC | PRN
  Administered 2018-05-28: 12:00:00 100 mL via INTRAVENOUS

## 2018-05-28 MED ORDER — SODIUM CHLORIDE (PF) 0.9 % IJ SOLN
INTRAMUSCULAR | Status: AC
  Filled 2018-05-28: qty 50

## 2018-05-29 ENCOUNTER — Inpatient Hospital Stay (HOSPITAL_BASED_OUTPATIENT_CLINIC_OR_DEPARTMENT_OTHER): Payer: PPO | Admitting: Oncology

## 2018-05-29 ENCOUNTER — Ambulatory Visit
Admission: RE | Admit: 2018-05-29 | Discharge: 2018-05-29 | Disposition: A | Discharge status: Home / Self Care | Payer: PPO | Source: Ambulatory Visit

## 2018-05-29 ENCOUNTER — Inpatient Hospital Stay: Payer: PPO

## 2018-05-29 ENCOUNTER — Other Ambulatory Visit: Payer: Self-pay

## 2018-05-29 ENCOUNTER — Telehealth: Payer: Self-pay | Admitting: *Deleted

## 2018-05-29 ENCOUNTER — Encounter: Payer: Self-pay | Admitting: Radiation Oncology

## 2018-05-29 ENCOUNTER — Telehealth: Payer: Self-pay | Admitting: Oncology

## 2018-05-29 ENCOUNTER — Ambulatory Visit
Admission: RE | Admit: 2018-05-29 | Discharge: 2018-05-29 | Disposition: A | Discharge status: Home / Self Care | Payer: PPO | Source: Ambulatory Visit | Attending: Radiation Oncology | Admitting: Radiation Oncology

## 2018-05-29 VITALS — Ht 67.0 in | Wt 182.0 lb

## 2018-05-29 VITALS — BP 98/67 | HR 125 | Temp 97.4°F | Resp 18 | Ht 67.0 in | Wt 182.0 lb

## 2018-05-29 DIAGNOSIS — Z95828 Presence of other vascular implants and grafts: Secondary | ICD-10-CM

## 2018-05-29 DIAGNOSIS — C20 Malignant neoplasm of rectum: Secondary | ICD-10-CM

## 2018-05-29 DIAGNOSIS — J8410 Pulmonary fibrosis, unspecified: Secondary | ICD-10-CM

## 2018-05-29 DIAGNOSIS — C3490 Malignant neoplasm of unspecified part of unspecified bronchus or lung: Secondary | ICD-10-CM

## 2018-05-29 DIAGNOSIS — N289 Disorder of kidney and ureter, unspecified: Secondary | ICD-10-CM

## 2018-05-29 DIAGNOSIS — Z79899 Other long term (current) drug therapy: Secondary | ICD-10-CM

## 2018-05-29 DIAGNOSIS — C342 Malignant neoplasm of middle lobe, bronchus or lung: Secondary | ICD-10-CM | POA: Diagnosis not present

## 2018-05-29 DIAGNOSIS — R0600 Dyspnea, unspecified: Secondary | ICD-10-CM | POA: Diagnosis not present

## 2018-05-29 DIAGNOSIS — Z5111 Encounter for antineoplastic chemotherapy: Secondary | ICD-10-CM | POA: Diagnosis not present

## 2018-05-29 DIAGNOSIS — J984 Other disorders of lung: Secondary | ICD-10-CM | POA: Diagnosis not present

## 2018-05-29 DIAGNOSIS — N200 Calculus of kidney: Secondary | ICD-10-CM

## 2018-05-29 LAB — CBC WITH DIFFERENTIAL (CANCER CENTER ONLY)
Abs Immature Granulocytes: 0.46 10*3/uL — ABNORMAL HIGH (ref 0.00–0.07)
Basophils Absolute: 0 10*3/uL (ref 0.0–0.1)
Basophils Relative: 0 %
Eosinophils Absolute: 0.1 10*3/uL (ref 0.0–0.5)
Eosinophils Relative: 1 %
HCT: 40.6 % (ref 39.0–52.0)
Hemoglobin: 13.5 g/dL (ref 13.0–17.0)
Immature Granulocytes: 5 %
Lymphocytes Relative: 25 %
Lymphs Abs: 2.5 10*3/uL (ref 0.7–4.0)
MCH: 32.1 pg (ref 26.0–34.0)
MCHC: 33.3 g/dL (ref 30.0–36.0)
MCV: 96.4 fL (ref 80.0–100.0)
Monocytes Absolute: 1 10*3/uL (ref 0.1–1.0)
Monocytes Relative: 10 %
Neutro Abs: 5.8 10*3/uL (ref 1.7–7.7)
Neutrophils Relative %: 59 %
Platelet Count: 69 10*3/uL — ABNORMAL LOW (ref 150–400)
RBC: 4.21 MIL/uL — ABNORMAL LOW (ref 4.22–5.81)
RDW: 17.4 % — ABNORMAL HIGH (ref 11.5–15.5)
WBC Count: 9.8 10*3/uL (ref 4.0–10.5)
nRBC: 0.2 % (ref 0.0–0.2)

## 2018-05-29 LAB — CMP (CANCER CENTER ONLY)
ALT: 30 U/L (ref 0–44)
AST: 29 U/L (ref 15–41)
Albumin: 3.1 g/dL — ABNORMAL LOW (ref 3.5–5.0)
Alkaline Phosphatase: 135 U/L — ABNORMAL HIGH (ref 38–126)
Anion gap: 12 (ref 5–15)
BUN: 11 mg/dL (ref 8–23)
CO2: 23 mmol/L (ref 22–32)
Calcium: 9.3 mg/dL (ref 8.9–10.3)
Chloride: 106 mmol/L (ref 98–111)
Creatinine: 1.33 mg/dL — ABNORMAL HIGH (ref 0.61–1.24)
GFR, Est AFR Am: >60 mL/min (ref >60)
GFR, Estimated: 53 mL/min — ABNORMAL LOW (ref >60)
Glucose, Bld: 158 mg/dL — ABNORMAL HIGH (ref 70–99)
Potassium: 3.6 mmol/L (ref 3.5–5.1)
Sodium: 141 mmol/L (ref 135–145)
Total Bilirubin: 0.4 mg/dL (ref 0.3–1.2)
Total Protein: 6.9 g/dL (ref 6.5–8.1)

## 2018-05-29 LAB — CEA (IN HOUSE-CHCC): CEA (CHCC-In House): 29.28 ng/mL — ABNORMAL HIGH (ref 0.00–5.00)

## 2018-05-29 MED ORDER — HEPARIN SOD (PORK) LOCK FLUSH 100 UNIT/ML IV SOLN
500.0000 [IU] | Freq: Once | INTRAVENOUS | Status: AC
  Administered 2018-05-29: 500 [IU]
  Filled 2018-05-29: qty 5

## 2018-05-29 MED ORDER — SODIUM CHLORIDE 0.9% FLUSH
10.0000 mL | Freq: Once | INTRAVENOUS | Status: AC
  Administered 2018-05-29: 10 mL
  Filled 2018-05-29: qty 10

## 2018-05-29 NOTE — Telephone Encounter (Signed)
Per Dr. Benay Spice: Patient has right kidney stone and stone in right ureter w/partial right hydronephrosis per CT scan. Patient notified and confirmed his urologist is Dr. Jeffie Pollock at [facility]. Urgent referral placed per Dr. Benay Spice and sent high priority scheduling message to work on referral.

## 2018-05-29 NOTE — Patient Instructions (Signed)

## 2018-05-29 NOTE — Progress Notes (Signed)
[facility] OFFICE PROGRESS NOTE   Diagnosis: Rectal cancer, non-small cell lung cancer  INTERVAL HISTORY:   [name] returns for a scheduled visit.  He completed another cycle of FOLFOX on 05/16/2018.  He tolerated the chemotherapy well.  No nausea/vomiting, mouth sores, or diarrhea.  Stable dyspnea.  No new complaint.  Objective:  Vital signs in last 24 hours:  Blood pressure 98/67, pulse (!) 125, temperature (!) 97.4 F (36.3 C), temperature source Oral, resp. rate 18, height '5\' 7"'  (1.702 m), weight 182 lb (82.6 kg), SpO2 95 %.   Physical examination-not performed today secondary to distancing with the COVID pandemic  Portacath/PICC-without erythema  Lab Results:  Lab Results  Component Value Date   WBC 9.8 05/29/2018   HGB 13.5 05/29/2018   HCT 40.6 05/29/2018   MCV 96.4 05/29/2018   PLT 69 (L) 05/29/2018   NEUTROABS 5.8 05/29/2018    CMP  Lab Results  Component Value Date   NA 141 05/29/2018   K 3.6 05/29/2018   CL 106 05/29/2018   CO2 23 05/29/2018   GLUCOSE 158 (H) 05/29/2018   BUN 11 05/29/2018   CREATININE 1.33 (H) 05/29/2018   CALCIUM 9.3 05/29/2018   PROT 6.9 05/29/2018   ALBUMIN 3.1 (L) 05/29/2018   AST 29 05/29/2018   ALT 30 05/29/2018   ALKPHOS 135 (H) 05/29/2018   BILITOT 0.4 05/29/2018   GFRNONAA 53 (L) 05/29/2018   GFRAA >60 05/29/2018    Lab Results  Component Value Date   CEA1 29.28 (H) 05/29/2018     Imaging:  Ct Chest W Contrast  Result Date: 05/29/2018 CLINICAL DATA:  Rectal cancer diagnosed in January. Ongoing chemotherapy EXAM: CT CHEST, ABDOMEN, AND PELVIS WITH CONTRAST TECHNIQUE: Multidetector CT imaging of the chest, abdomen and pelvis was performed following the standard protocol during bolus administration of intravenous contrast. CONTRAST:  163m OMNIPAQUE IOHEXOL 300 MG/ML  SOLN COMPARISON:  04/19/2018 chest CT. 04/02/2018 abdominopelvic CT. PET of 02/01/2018. FINDINGS: CT CHEST FINDINGS Cardiovascular:  Right Port-A-Cath tip at high right atrium. Suspicion of thrombus within the right jugular vein including on image 2/2. Normal caliber of the aorta and branch vessels. Mild cardiomegaly, without pericardial effusion. No central pulmonary embolism, on this non-dedicated study. Mediastinum/Nodes: No supraclavicular adenopathy. Low left periesophageal node measures 10 mm on image 28/2 versus 11 mm on 04/19/2018. A node within the azygoesophageal recess measures 10 mm on image 28/2 versus 1.4 cm on the prior. Left infrahilar nodal mass measures 1.6 x 2.1 cm on image 31/2. Compare 2.0 x 2.1 cm on the prior (when remeasured). Small to moderate hiatal hernia. Lungs/Pleura: No pleural fluid. Interstitial lung disease, with basilar and subpleural predominant reticulation, architectural distortion, and traction bronchiectasis/bronchiolectasis. 1.5 by 1.2 cm left lower lobe pulmonary nodule on image 103/4. This is within an area of focal pulmonary fibrosis. Compare 1.4 x 1.3 cm on the 04/19/2018 CT (when remeasured). Musculoskeletal: No acute osseous abnormality. CT ABDOMEN PELVIS FINDINGS Hepatobiliary: Mild hepatic steatosis, without focal liver lesion. Gallstones of up to 2.0 cm without acute cholecystitis or biliary duct dilatation. Pancreas: Normal, without mass or ductal dilatation. Spleen: Normal in size, without focal abnormality. Adrenals/Urinary Tract: Normal adrenal glands. Moderate right renal cortical thinning. Interpolar minimally complex right renal cyst of 1.4 cm. Bilateral too small to characterize renal lesions. Upper pole left renal cyst or minimally complex cyst of 1.7 cm. Multiple bilateral renal collecting system calculi. A stone at the right ureteropelvic junction is new, including at 1.7  by 1.6 cm cm on image 70/2. This results in moderate right-sided caliectasis. Minimal right hydroureter more distally, with a distal 9 mm ureteric stone on image 105/2. This is new. Normal urinary bladder.  Stomach/Bowel: Normal remainder of the stomach. Eccentric right rectal wall thickening is similar, including at 1.3 cm on image 116/2. No obstruction. Scattered colonic diverticula. Normal terminal ileum and appendix. Normal small bowel. Vascular/Lymphatic: Aortic and branch vessel atherosclerosis. No abdominal adenopathy. The right perirectal node is decreased in size, 2-3 mm on image 115/2. 4 mm on 04/02/2018. Reproductive: Mild prostatomegaly. Other: No significant free fluid. No evidence of omental or peritoneal disease. Musculoskeletal: Degenerate disc disease, primarily at L3-4. IMPRESSION: CT CHEST IMPRESSION 1. Since the prior chest CT of 04/19/2018, the left lower lobe pulmonary nodule is similar. Left infrahilar and mediastinal adenopathy is decreased, as detailed above. 2. Interstitial lung disease, most consistent with usual interstitial pneumonitis. 3. Suspicion of right jugular vein thrombus. Consider confirmation with dedicated ultrasound. 4.  Aortic Atherosclerosis (ICD10-I70.0). 5. Small to moderate hiatal hernia. CT ABDOMEN AND PELVIS IMPRESSION 1. Since the CT of the abdomen and pelvis of 04/02/2018, the right sided rectal wall thickening/mass is similar. A perirectal node is minimally decreased in size. 2. No new or progressive disease within the abdomen or pelvis. 3. Stones at the right ureteropelvic junction and distal right ureter are new, causing hydroureteronephrosis. 4. Cholelithiasis. 5. Hepatic steatosis. 6.  Aortic Atherosclerosis (ICD10-I70.0). These results will be called to the ordering clinician or representative by the Radiologist Assistant, and communication documented in the PACS or zVision Dashboard. Electronically Signed   By: Abigail Miyamoto M.D.   On: 05/29/2018 11:57   Ct Abdomen Pelvis W Contrast  Result Date: 05/29/2018 CLINICAL DATA:  Rectal cancer diagnosed in January. Ongoing chemotherapy EXAM: CT CHEST, ABDOMEN, AND PELVIS WITH CONTRAST TECHNIQUE: Multidetector CT  imaging of the chest, abdomen and pelvis was performed following the standard protocol during bolus administration of intravenous contrast. CONTRAST:  119m OMNIPAQUE IOHEXOL 300 MG/ML  SOLN COMPARISON:  04/19/2018 chest CT. 04/02/2018 abdominopelvic CT. PET of 02/01/2018. FINDINGS: CT CHEST FINDINGS Cardiovascular: Right Port-A-Cath tip at high right atrium. Suspicion of thrombus within the right jugular vein including on image 2/2. Normal caliber of the aorta and branch vessels. Mild cardiomegaly, without pericardial effusion. No central pulmonary embolism, on this non-dedicated study. Mediastinum/Nodes: No supraclavicular adenopathy. Low left periesophageal node measures 10 mm on image 28/2 versus 11 mm on 04/19/2018. A node within the azygoesophageal recess measures 10 mm on image 28/2 versus 1.4 cm on the prior. Left infrahilar nodal mass measures 1.6 x 2.1 cm on image 31/2. Compare 2.0 x 2.1 cm on the prior (when remeasured). Small to moderate hiatal hernia. Lungs/Pleura: No pleural fluid. Interstitial lung disease, with basilar and subpleural predominant reticulation, architectural distortion, and traction bronchiectasis/bronchiolectasis. 1.5 by 1.2 cm left lower lobe pulmonary nodule on image 103/4. This is within an area of focal pulmonary fibrosis. Compare 1.4 x 1.3 cm on the 04/19/2018 CT (when remeasured). Musculoskeletal: No acute osseous abnormality. CT ABDOMEN PELVIS FINDINGS Hepatobiliary: Mild hepatic steatosis, without focal liver lesion. Gallstones of up to 2.0 cm without acute cholecystitis or biliary duct dilatation. Pancreas: Normal, without mass or ductal dilatation. Spleen: Normal in size, without focal abnormality. Adrenals/Urinary Tract: Normal adrenal glands. Moderate right renal cortical thinning. Interpolar minimally complex right renal cyst of 1.4 cm. Bilateral too small to characterize renal lesions. Upper pole left renal cyst or minimally complex cyst of 1.7 cm. Multiple bilateral  renal collecting system calculi. A stone at the right ureteropelvic junction is new, including at 1.7 by 1.6 cm cm on image 70/2. This results in moderate right-sided caliectasis. Minimal right hydroureter more distally, with a distal 9 mm ureteric stone on image 105/2. This is new. Normal urinary bladder. Stomach/Bowel: Normal remainder of the stomach. Eccentric right rectal wall thickening is similar, including at 1.3 cm on image 116/2. No obstruction. Scattered colonic diverticula. Normal terminal ileum and appendix. Normal small bowel. Vascular/Lymphatic: Aortic and branch vessel atherosclerosis. No abdominal adenopathy. The right perirectal node is decreased in size, 2-3 mm on image 115/2. 4 mm on 04/02/2018. Reproductive: Mild prostatomegaly. Other: No significant free fluid. No evidence of omental or peritoneal disease. Musculoskeletal: Degenerate disc disease, primarily at L3-4. IMPRESSION: CT CHEST IMPRESSION 1. Since the prior chest CT of 04/19/2018, the left lower lobe pulmonary nodule is similar. Left infrahilar and mediastinal adenopathy is decreased, as detailed above. 2. Interstitial lung disease, most consistent with usual interstitial pneumonitis. 3. Suspicion of right jugular vein thrombus. Consider confirmation with dedicated ultrasound. 4.  Aortic Atherosclerosis (ICD10-I70.0). 5. Small to moderate hiatal hernia. CT ABDOMEN AND PELVIS IMPRESSION 1. Since the CT of the abdomen and pelvis of 04/02/2018, the right sided rectal wall thickening/mass is similar. A perirectal node is minimally decreased in size. 2. No new or progressive disease within the abdomen or pelvis. 3. Stones at the right ureteropelvic junction and distal right ureter are new, causing hydroureteronephrosis. 4. Cholelithiasis. 5. Hepatic steatosis. 6.  Aortic Atherosclerosis (ICD10-I70.0). These results will be called to the ordering clinician or representative by the Radiologist Assistant, and communication documented in the  PACS or zVision Dashboard. Electronically Signed   By: Abigail Miyamoto M.D.   On: 05/29/2018 11:57    Medications: I have reviewed the patient's current medications.   Assessment/Plan: 1. Rectal cancer ? Distal rectal mass noted on colonoscopy 01/21/2018, biopsy confirmed invasive adenocarcinoma, cytokeratin 20 (patchy) and CDX 2+, cytokeratin 7 and TTF-1 negative. PIK3CA, APC, TP53 detected. ? CTs 01/29/2018-new left infrahilar mass, enlarged left hilar lymph node, mild subcarinal lymphadenopathy, confluent ill-defined lesion in area fibrosis at the posterior left lower lobe measuring 2 x 1.5 cm, rectal mass, perirectal lymph nodes ? MRI 01/28/2018-[id number] located at 6.6 cm from the internal anal sphincter, 2 mesorectal lymph nodes ? Hypermetabolic left infrahilar mass. Hypermetabolic nodule left lower lobe. Focal hypermetabolic activity within the rectum. ? Bronchoscopy 02/07/2018-no endobronchial lesions or abnormal secretions seen. Transbronchial brushings obtained from the left lower lobe nodule. Significant enlargement of the station 7, station 10 L, station 11 L nodes. Needle biopsies were taken from station 7 and 11 L nodes. ? Pathology from the left lower lobe brushings, a level 7 lymph node returned positive for adenocarcinoma, positive for cytokeratin 7 and CDX2, negative for cytokeratin 20 and TTF-1.  Fine-needle aspiration EBUS, 7, B with malignant cells consistent with adenocarcinoma; CCND1 (amp) detected. ? Cycle 1 FOLFOX 02/21/2018 ? Cycle 2 FOLFOX 03/13/2018,Udenyca added ? Cycle 3 FOLFOX 03/28/2018 ? Cycle 4 FOLFOX 04/18/2018 (oxaliplatin held) ? CT chest 04/19/2018- left infrahilar node previously measured 2.6 x 3.4, current 1.8 x 2.0.  11 mm paraesophageal lymph node, previously 14 mm. ? Cycle 5 FOLFOX 05/02/2018 ? Cycle 6 FOLFOX 05/16/2018 ? CT chest, abdomen, and pelvis on 05/28/2018- stable left lower lobe nodule, decreased left infrahilar mediastinal adenopathy, suspicion of  right jugular vein thrombus, decreased perirectal lymph node, stable rectal wall thickening, new right ureteropelvic and distal right ureter stones with hydroureteronephrosis  2. Pulmonary fibrosis 3. Kidney stones-new stones with right hydroureteronephrosis on CT 05/27/2018 4. BPH 5. History of gastroesophageal reflux 6. Mild renal insufficiency 7. Port-A-Cath placement 02/14/2018 8. Neutropenia and thrombocytopenia secondary to chemotherapy, Udenyca added with cycle 2 and oxaliplatin dose reduced 9. Tachycardia-chest CT 04/19/2018 negative for pulmonary embolus; underlying fibrotic lung disease again noted; slight increase patchy groundglass attenuation right upper lobe.  Echocardiogram 04/23/2018- calculated EF 64%.  Tachycardia improved 05/02/2018   Disposition: To me Mr. Weick has completed 6 cycles of FOLFOX.  He has tolerated the chemotherapy well.  The restaging CT reveals improvement in chest lymphadenopathy and perirectal lymphadenopathy.  No evidence of disease progression.  I suspect Mr. Dyar has distinct lung and rectal primaries.  The molecular profile from the rectal and lung biopsies is different.  His case was presented at the GI tumor conference today.  The plan is to proceed with Xeloda and radiation for the rectal cancer.  We will then address the lung cancer, likely concurrent chemotherapy and radiation.  We will make a referral to a colorectal surgeon and thoracic surgery.  I contacted radiation oncology.  The plan is to initiate rectal radiation on 06/10/2018.  He will receive concurrent capecitabine.  I reviewed potential toxicities associated with capecitabine including the chance for mucositis, diarrhea, hematologic toxicity, hand/foot syndrome, sun sensitivity, and hyperpigmentation.  He agrees to proceed.  Mr. Pickar will be scheduled for office visit on 06/24/2018.  I will refer him to urology for evaluation of the renal stones.  Betsy Coder, MD  05/29/2018  3:11  PM

## 2018-05-29 NOTE — Telephone Encounter (Signed)
Scheduled appt per 5/20 los. ° °Patient aware of appt date and time. °

## 2018-05-29 NOTE — Patient Instructions (Signed)
Provided patient with reading information on Xeloda. Informed him that oral chemo specialist, Evelena Peat PharmD will be calling him.

## 2018-05-29 NOTE — Progress Notes (Addendum)
Radiation Oncology         ([phone]) [phone] ________________________________  [facility] Consultation - Conducted via telephone due to current COVID-19 concerns for limiting patient exposure  I spoke with the patient to conduct this consult visit via telephone to spare the patient unnecessary potential exposure in the healthcare setting during the current COVID-19 pandemic. The patient was notified in advance and was offered a [place] meeting to allow for face to face communication but unfortunately reported that they did not have the appropriate resources/technology to support such a visit and instead preferred to proceed with a telephone consult. ________________________________  Name: Austin Yu        MRN: [id number]  Date of Service: 05/29/2018 DOB: 09-11-1945  WI:OXBDZ, Thayer Jew, MD  Ladell Pier, MD     REFERRING PHYSICIAN: Ladell Pier, MD   DIAGNOSIS: The encounter diagnosis was Rectal cancer [facility]).   HISTORY OF PRESENT ILLNESS: Austin Yu is a 73 y.o. male originally seen at the request of Dr. Loletha Carrow for a newly diagnosed rectal cancer. The patient has a history of IPF seen at [facility] by Pulmonary and with [facility]. He remains on OFEV and has been stable with his IPF per report since 2014. He recently saw his PCP and completed a cologard test that was positive and he was referred to Dr. Loletha Carrow. He proceeded with colonoscopy on 01/21/2018 and a mass in the rectum was palpated and biopsied and revealed adenocarcinoma. An MRI pelvis on 01/28/2018 revealed T3N1b disease. He also underwent CT CAP on 01/29/2018  that revealed concerns for an ill defined opacity in the area of fibrosis in the LLL measuring 2 x 1.5 cm, and new left infrahilar adenopathy measuring 3.4 x 2.6 cm, and left hilar node measuring 1.7 cm, and mild subcarinal adenopathy the largest measuring 1.4 cm. These are new since his scan in March 2019. PET imaging on 02/01/2018 revealed hypermetabolism in the  left infrahilar nodal mass, and in the mass in the LLL. His rectum was also hypermetabolic without hypermetabolic nodes. He underwent Bronch with EBUS and final pathology revealed adenocarcinoma. He began FOLFOX for 6 cycles. In April his [facility] 1 testing confirmed a difference in the molecular make up of the two tumors, favoring a synchronous stage III rectal cancer and a stage III NSCLC. He was discussed in conference this morning, and plans were to proceed with local therapy to the rectum with chemoRT. He has yet to meet with a CRS, but his case has been discussed with Dr. Dema Severin.     PREVIOUS RADIATION THERAPY: No   PAST MEDICAL HISTORY:  Past Medical History:  Diagnosis Date   Abnormal chest sounds    BPH (benign prostatic hypertrophy)    Cancer (HCC)    rectum cancer; dx 01/21/2018 by Dr. Loletha Carrow by colonoscopy   Complication of anesthesia    ISSUE W/ EXTUBATION 05-18-2015 IN [facility] -- REFER TO ANESTHESIA NOTE''S   Elevated PSA    GERD (gastroesophageal reflux disease)    History of airway aspiration    History of kidney stones    Hyperlipidemia    Hyperplasia of prostate without lower urinary tract symptoms (LUTS)    Left ureteral stone    Lung nodule    Nephrolithiasis    Postinflammatory pulmonary fibrosis (HCC)    PULMOLOGIST-  DR HGDJ-  IDIOPATHIC    Pulmonary fibrosis (HCC)    Strawberry hemangioma of skin    Upper airway cough syndrome    PULMOLOGIST-  DR Melvyn Novas       PAST SURGICAL HISTORY: Past Surgical History:  Procedure Laterality Date   BIOPSY  01/21/2018   Procedure: BIOPSY;  Surgeon: Doran Stabler, MD;  Location: [facility];  Service: Gastroenterology;;   COLONOSCOPY WITH PROPOFOL N/A 01/21/2018   Procedure: COLONOSCOPY WITH PROPOFOL;  Surgeon: Doran Stabler, MD;  Location: [facility];  Service: Gastroenterology;  Laterality: N/A;   CYSTOSCOPY WITH HOLMIUM LASER LITHOTRIPSY Left 06/01/2015   Procedure: CYSTOSCOPY WITH HOLMIUM  LASER LITHOTRIPSY;  Surgeon: Irine Seal, MD;  Location: [facility];  Service: Urology;  Laterality: Left;   CYSTOSCOPY WITH URETEROSCOPY, STONE BASKETRY AND STENT PLACEMENT Left 05/18/2015   Procedure: CYSTOSCOPY WITH STENT PLACEMENT AND RETROGRADE PYELOGRAM;  Surgeon: Irine Seal, MD;  Location: [facility];  Service: Urology;  Laterality: Left;   CYSTOSCOPY WITH URETEROSCOPY, STONE BASKETRY AND STENT PLACEMENT Left 06/01/2015   Procedure: CYSTOSCOPY WITH URETEROSCOPY, STONE BASKETRY AND STENT PLACEMENT;  Surgeon: Irine Seal, MD;  Location: [facility];  Service: Urology;  Laterality: Left;   EXTRACORPOREAL SHOCK WAVE LITHOTRIPSY  2007   IR IMAGING GUIDED PORT INSERTION  02/14/2018   TONSILLECTOMY  age [age]   VIDEO BRONCHOSCOPY WITH ENDOBRONCHIAL NAVIGATION Left 02/07/2018   Procedure: VIDEO BRONCHOSCOPY WITH ENDOBRONCHIAL NAVIGATION;  Surgeon: Collene Gobble, MD;  Location: [facility];  Service: Thoracic;  Laterality: Left;   VIDEO BRONCHOSCOPY WITH ENDOBRONCHIAL ULTRASOUND Left 02/07/2018   Procedure: VIDEO BRONCHOSCOPY WITH ENDOBRONCHIAL ULTRASOUND;  Surgeon: Collene Gobble, MD;  Location: [facility];  Service: Thoracic;  Laterality: Left;     FAMILY HISTORY:  Family History  Problem Relation Age of Onset   Heart failure Father        CHF   Healthy Brother    Healthy Daughter    Healthy Daughter      SOCIAL HISTORY:  reports that he has never smoked. He has never used smokeless tobacco. He reports current alcohol use. He reports that he does not use drugs. The patient is widowed and his wife died about 20 years ago from ovarian cancer. He has young grandchildren and is very active in their lives. 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    ALLERGIES: Patient has no known allergies.   MEDICATIONS:  Current Outpatient Medications  Medication Sig Dispense Refill   acetaminophen (TYLENOL) 325 MG  tablet Take 650 mg by mouth every 6 (six) hours as needed for moderate pain.     famotidine (PEPCID) 20 MG tablet Take 1 tablet (20 mg total) by mouth at bedtime. 90 tablet 3   lidocaine-prilocaine (EMLA) cream Apply to Port-A-Cath 1 to 2 hours prior to use 30 g 2   pantoprazole (PROTONIX) 40 MG tablet Take 1 tablet (40 mg total) by mouth daily. 30 tablet 5   prochlorperazine (COMPAZINE) 10 MG tablet Take 1 tablet (10 mg total) by mouth every 6 (six) hours as needed for nausea or vomiting. 30 tablet 2   tamsulosin (FLOMAX) 0.4 MG CAPS capsule Take 0.4 mg by mouth at bedtime.      No current facility-administered medications for this encounter.      REVIEW OF SYSTEMS: On review of systems the patient reports he's doing well without significant difficulties with breathing, or bowel function. He denies any hemoptysis or rectal bleeding. No other complaints are verbalized.     PHYSICAL EXAM:  Unable to assess due to encounter  type.   ECOG =0  0 - Asymptomatic (Fully active, able to carry on all predisease activities without restriction)  1 - Symptomatic but completely ambulatory (Restricted in physically strenuous activity but ambulatory and able to carry out work of a light or sedentary nature. For example, light housework, office work)  2 - Symptomatic, <50% in bed during the day (Ambulatory and capable of all self care but unable to carry out any work activities. Up and about more than 50% of waking hours)  3 - Symptomatic, >50% in bed, but not bedbound (Capable of only limited self-care, confined to bed or chair 50% or more of waking hours)  4 - Bedbound (Completely disabled. Cannot carry on any self-care. Totally confined to bed or chair)  5 - Death   Eustace Pen MM, Creech RH, Tormey DC, et al. [phone]). "Toxicity and response criteria of the [facility] Group". [place] Oncol. 5 (6): 649-55    LABORATORY DATA:  Lab Results  Component Value Date   WBC 9.8  05/29/2018   HGB 13.5 05/29/2018   HCT 40.6 05/29/2018   MCV 96.4 05/29/2018   PLT 69 (L) 05/29/2018   Lab Results  Component Value Date   NA 141 05/29/2018   K 3.6 05/29/2018   CL 106 05/29/2018   CO2 23 05/29/2018   Lab Results  Component Value Date   ALT 30 05/29/2018   AST 29 05/29/2018   ALKPHOS 135 (H) 05/29/2018   BILITOT 0.4 05/29/2018      RADIOGRAPHY: Ct Chest W Contrast  Result Date: 05/29/2018 CLINICAL DATA:  Rectal cancer diagnosed in January. Ongoing chemotherapy EXAM: CT CHEST, ABDOMEN, AND PELVIS WITH CONTRAST TECHNIQUE: Multidetector CT imaging of the chest, abdomen and pelvis was performed following the standard protocol during bolus administration of intravenous contrast. CONTRAST:  134mL OMNIPAQUE IOHEXOL 300 MG/ML  SOLN COMPARISON:  04/19/2018 chest CT. 04/02/2018 abdominopelvic CT. PET of 02/01/2018. FINDINGS: CT CHEST FINDINGS Cardiovascular: Right Port-A-Cath tip at high right atrium. Suspicion of thrombus within the right jugular vein including on image 2/2. Normal caliber of the aorta and branch vessels. Mild cardiomegaly, without pericardial effusion. No central pulmonary embolism, on this non-dedicated study. Mediastinum/Nodes: No supraclavicular adenopathy. Low left periesophageal node measures 10 mm on image 28/2 versus 11 mm on 04/19/2018. A node within the azygoesophageal recess measures 10 mm on image 28/2 versus 1.4 cm on the prior. Left infrahilar nodal mass measures 1.6 x 2.1 cm on image 31/2. Compare 2.0 x 2.1 cm on the prior (when remeasured). Small to moderate hiatal hernia. Lungs/Pleura: No pleural fluid. Interstitial lung disease, with basilar and subpleural predominant reticulation, architectural distortion, and traction bronchiectasis/bronchiolectasis. 1.5 by 1.2 cm left lower lobe pulmonary nodule on image 103/4. This is within an area of focal pulmonary fibrosis. Compare 1.4 x 1.3 cm on the 04/19/2018 CT (when remeasured). Musculoskeletal: No acute  osseous abnormality. CT ABDOMEN PELVIS FINDINGS Hepatobiliary: Mild hepatic steatosis, without focal liver lesion. Gallstones of up to 2.0 cm without acute cholecystitis or biliary duct dilatation. Pancreas: Normal, without mass or ductal dilatation. Spleen: Normal in size, without focal abnormality. Adrenals/Urinary Tract: Normal adrenal glands. Moderate right renal cortical thinning. Interpolar minimally complex right renal cyst of 1.4 cm. Bilateral too small to characterize renal lesions. Upper pole left renal cyst or minimally complex cyst of 1.7 cm. Multiple bilateral renal collecting system calculi. A stone at the right ureteropelvic junction is new, including at 1.7 by 1.6 cm cm on image 70/2. This results  in moderate right-sided caliectasis. Minimal right hydroureter more distally, with a distal 9 mm ureteric stone on image 105/2. This is new. Normal urinary bladder. Stomach/Bowel: Normal remainder of the stomach. Eccentric right rectal wall thickening is similar, including at 1.3 cm on image 116/2. No obstruction. Scattered colonic diverticula. Normal terminal ileum and appendix. Normal small bowel. Vascular/Lymphatic: Aortic and branch vessel atherosclerosis. No abdominal adenopathy. The right perirectal node is decreased in size, 2-3 mm on image 115/2. 4 mm on 04/02/2018. Reproductive: Mild prostatomegaly. Other: No significant free fluid. No evidence of omental or peritoneal disease. Musculoskeletal: Degenerate disc disease, primarily at L3-4. IMPRESSION: CT CHEST IMPRESSION 1. Since the prior chest CT of 04/19/2018, the left lower lobe pulmonary nodule is similar. Left infrahilar and mediastinal adenopathy is decreased, as detailed above. 2. Interstitial lung disease, most consistent with usual interstitial pneumonitis. 3. Suspicion of right jugular vein thrombus. Consider confirmation with dedicated ultrasound. 4.  Aortic Atherosclerosis (ICD10-I70.0). 5. Small to moderate hiatal hernia. CT ABDOMEN AND  PELVIS IMPRESSION 1. Since the CT of the abdomen and pelvis of 04/02/2018, the right sided rectal wall thickening/mass is similar. A perirectal node is minimally decreased in size. 2. No new or progressive disease within the abdomen or pelvis. 3. Stones at the right ureteropelvic junction and distal right ureter are new, causing hydroureteronephrosis. 4. Cholelithiasis. 5. Hepatic steatosis. 6.  Aortic Atherosclerosis (ICD10-I70.0). These results will be called to the ordering clinician or representative by the Radiologist Assistant, and communication documented in the PACS or zVision Dashboard. Electronically Signed   By: Abigail Miyamoto M.D.   On: 05/29/2018 11:57   Ct Abdomen Pelvis W Contrast  Result Date: 05/29/2018 CLINICAL DATA:  Rectal cancer diagnosed in January. Ongoing chemotherapy EXAM: CT CHEST, ABDOMEN, AND PELVIS WITH CONTRAST TECHNIQUE: Multidetector CT imaging of the chest, abdomen and pelvis was performed following the standard protocol during bolus administration of intravenous contrast. CONTRAST:  137mL OMNIPAQUE IOHEXOL 300 MG/ML  SOLN COMPARISON:  04/19/2018 chest CT. 04/02/2018 abdominopelvic CT. PET of 02/01/2018. FINDINGS: CT CHEST FINDINGS Cardiovascular: Right Port-A-Cath tip at high right atrium. Suspicion of thrombus within the right jugular vein including on image 2/2. Normal caliber of the aorta and branch vessels. Mild cardiomegaly, without pericardial effusion. No central pulmonary embolism, on this non-dedicated study. Mediastinum/Nodes: No supraclavicular adenopathy. Low left periesophageal node measures 10 mm on image 28/2 versus 11 mm on 04/19/2018. A node within the azygoesophageal recess measures 10 mm on image 28/2 versus 1.4 cm on the prior. Left infrahilar nodal mass measures 1.6 x 2.1 cm on image 31/2. Compare 2.0 x 2.1 cm on the prior (when remeasured). Small to moderate hiatal hernia. Lungs/Pleura: No pleural fluid. Interstitial lung disease, with basilar and subpleural  predominant reticulation, architectural distortion, and traction bronchiectasis/bronchiolectasis. 1.5 by 1.2 cm left lower lobe pulmonary nodule on image 103/4. This is within an area of focal pulmonary fibrosis. Compare 1.4 x 1.3 cm on the 04/19/2018 CT (when remeasured). Musculoskeletal: No acute osseous abnormality. CT ABDOMEN PELVIS FINDINGS Hepatobiliary: Mild hepatic steatosis, without focal liver lesion. Gallstones of up to 2.0 cm without acute cholecystitis or biliary duct dilatation. Pancreas: Normal, without mass or ductal dilatation. Spleen: Normal in size, without focal abnormality. Adrenals/Urinary Tract: Normal adrenal glands. Moderate right renal cortical thinning. Interpolar minimally complex right renal cyst of 1.4 cm. Bilateral too small to characterize renal lesions. Upper pole left renal cyst or minimally complex cyst of 1.7 cm. Multiple bilateral renal collecting system calculi. A stone at the right  ureteropelvic junction is new, including at 1.7 by 1.6 cm cm on image 70/2. This results in moderate right-sided caliectasis. Minimal right hydroureter more distally, with a distal 9 mm ureteric stone on image 105/2. This is new. Normal urinary bladder. Stomach/Bowel: Normal remainder of the stomach. Eccentric right rectal wall thickening is similar, including at 1.3 cm on image 116/2. No obstruction. Scattered colonic diverticula. Normal terminal ileum and appendix. Normal small bowel. Vascular/Lymphatic: Aortic and branch vessel atherosclerosis. No abdominal adenopathy. The right perirectal node is decreased in size, 2-3 mm on image 115/2. 4 mm on 04/02/2018. Reproductive: Mild prostatomegaly. Other: No significant free fluid. No evidence of omental or peritoneal disease. Musculoskeletal: Degenerate disc disease, primarily at L3-4. IMPRESSION: CT CHEST IMPRESSION 1. Since the prior chest CT of 04/19/2018, the left lower lobe pulmonary nodule is similar. Left infrahilar and mediastinal adenopathy is  decreased, as detailed above. 2. Interstitial lung disease, most consistent with usual interstitial pneumonitis. 3. Suspicion of right jugular vein thrombus. Consider confirmation with dedicated ultrasound. 4.  Aortic Atherosclerosis (ICD10-I70.0). 5. Small to moderate hiatal hernia. CT ABDOMEN AND PELVIS IMPRESSION 1. Since the CT of the abdomen and pelvis of 04/02/2018, the right sided rectal wall thickening/mass is similar. A perirectal node is minimally decreased in size. 2. No new or progressive disease within the abdomen or pelvis. 3. Stones at the right ureteropelvic junction and distal right ureter are new, causing hydroureteronephrosis. 4. Cholelithiasis. 5. Hepatic steatosis. 6.  Aortic Atherosclerosis (ICD10-I70.0). These results will be called to the ordering clinician or representative by the Radiologist Assistant, and communication documented in the PACS or zVision Dashboard. Electronically Signed   By: Abigail Miyamoto M.D.   On: 05/29/2018 11:57       IMPRESSION/PLAN: 1. Stage IIIB, cT3N1bMx adenocarcinoma of the rectum. The patient has completed his 6 cycles of FOLFOX. His case was discussed in conference this morning and he is ready to proceed with chemoRT followed by surgery. He is being referred to Dr. Dema Severin in [place]. His lung cancer plays a role in the timing of this and we will follow up with all involved including pulmonary to plan out the appropriate time to give chemoRT for his lung disease. We spent time today reviewing the rationale for ChemoRT to treat his rectal cancer over 5 1/2 weeks. We reviewed the risks, benefits, short, and long term effects of therapy and the patient is ready to proceed. He will return tomorrow for simulation and will sign consent for treatment at that time.  2. Stage III NSCLC, adenocarcinoma of the left infrahilar region. As above, the recommendations from conference were to proceed with treatment of the rectal cancer first, followed by chemoRT to the left lung  and regional nodes, and possibly do this prior to surgery to resect his rectal cancer. He is in agreement and we reviewed the risks, benefits, short, and long term effects of treatment and he is interested in therapy at the appropriate interval.  3. Interstitial Lung Disease. We reviewed the risks of radiotherapy as it pertains to the lungs as we would anticipate radiotherapy to the chest. We will continue to work with pulmonary medicine as well throughout the process.    Given current concerns for patient exposure during the COVID-19 pandemic, this encounter was conducted via telephone.  The patient has given verbal consent for this type of encounter. The time spent during this encounter was 25 minutes and 50% of that time was spent in the coordination of his care. The attendants  for this meeting include Shona Simpson, [facility] and [name]  During the encounter, Shona Simpson [facility] was located at [facility] Radiation Oncology Department.  [name]  was located at home.     Carola Rhine, PAC

## 2018-05-29 NOTE — Progress Notes (Signed)
GI Location of Tumor / Histology: Rectal Cancer  Austin Yu presented to his PCP for routine colonoscopy, had positive cologuard test.  ? Distal rectal mass noted on colonoscopy 01/21/2018, biopsy confirmed invasive adenocarcinoma, cytokeratin 20 (patchy) and CDX 2+, cytokeratin 7 and TTF-1 negative. PIK3CA, APC, TP53 detected.  ? CTs 01/29/2018-new left infrahilar mass, enlarged left hilar lymph node, mild subcarinal lymphadenopathy, confluent ill-defined lesion in area fibrosis at the posterior left lower lobe measuring 2 x 1.5 cm, rectal mass, perirectal lymph nodes  ? MRI 01/28/2018-T3bN1tumor located at 6.6 cm from the internal anal sphincter, 2 mesorectal lymph nodes  ? Hypermetabolic left infrahilar mass. Hypermetabolic nodule left lower lobe. Focal hypermetabolic activity within the rectum.  Biopsy of Rectum 01/21/2018   Past/Anticipated interventions by medical oncology, if any:  Dr. Benay Spice 05/16/2018 Austin Yu appears unchanged.  He will complete another cycle of FOLFOX today. -He will undergo restaging CT's after this cycle.  I will present his case at the GI tumor conference to decide on continuing neoadjuvant therapy for rectal cancer and management of the lung tumor.  Weight changes, if any: No  Bowel/Bladder complaints, if any: No  Nausea / Vomiting, if any: No  Pain issues, if any:  No  Any blood per rectum: None    SAFETY ISSUES:  Prior radiation? No  Pacemaker/ICD? No  Possible current pregnancy? N/A Is the patient on methotrexate? No  Current Complaints/Details:

## 2018-05-30 ENCOUNTER — Other Ambulatory Visit: Payer: Self-pay

## 2018-05-30 ENCOUNTER — Other Ambulatory Visit: Payer: Self-pay | Admitting: Radiation Oncology

## 2018-05-30 ENCOUNTER — Telehealth: Payer: Self-pay | Admitting: Pharmacist

## 2018-05-30 ENCOUNTER — Ambulatory Visit
Admission: RE | Admit: 2018-05-30 | Discharge: 2018-05-30 | Disposition: A | Discharge status: Home / Self Care | Payer: PPO | Source: Ambulatory Visit | Attending: Radiation Oncology | Admitting: Radiation Oncology

## 2018-05-30 ENCOUNTER — Ambulatory Visit: Payer: PPO

## 2018-05-30 DIAGNOSIS — C20 Malignant neoplasm of rectum: Secondary | ICD-10-CM | POA: Diagnosis not present

## 2018-05-30 DIAGNOSIS — Z510 Encounter for antineoplastic radiation therapy: Secondary | ICD-10-CM | POA: Diagnosis not present

## 2018-05-30 DIAGNOSIS — C3490 Malignant neoplasm of unspecified part of unspecified bronchus or lung: Secondary | ICD-10-CM

## 2018-05-30 MED ORDER — CAPECITABINE 500 MG PO TABS: ORAL_TABLET | ORAL | 0 refills | Status: DC

## 2018-05-30 NOTE — Telephone Encounter (Addendum)
Oral Oncology Pharmacist Encounter  Received new prescription for Xeloda (capecitabine) for the neoadjuvant treatment of metastatic rectal cancer in conjunction with radiation, planned duration 5 1/2 weeks of concurrent chemoradiation.  Original diagnosis in Jan 2020 Patient was noted with T3N1 rectal mass plus lung adenopathy and a lung mass Patient has received FOLFOX x 6 cycles He is now under evaluation to receive concurrent radiation with capecitabine sensitization for a 5 1/2 week course (28 treatment days over 38 calendar days) Radiation is planned to be administered 06/10/18-07/18/18  Xeloda prescription is written for ~880 mg/m2 (1500mg ) by mouth 2 times daily on radiation days only  Labs from 05/29/18 assessed, OK for treatment initiation.  SCr=1.33, est CrCl ~ 59 mL/min Manufacturer recommends dose reduction with CrCl < 50 mL/min   Noted platelet count at 69k, will continue to be monitored  Current medication list in Epic reviewed, moderate DDI with Xeloda identified:  Category C interaction with Xeloda and pantoprazole: conflicting retrospective data with one analysis showing decrease in OS and PFS when Xeloda was used for adjuvant treatment of gastric cancer concurrently with PPI, another study showing no effect on important efficacy outcomes. Patient will be screened for ability to discontinue PPI therapy. If unable to d/c PPI, no change to treatment is indicated.  Prescription has been e-scribed to the [facility] for benefits analysis and approval.  Oral Oncology Clinic will continue to follow for insurance authorization, copayment issues, initial counseling and start date.  Johny Drilling, PharmD, BCPS, BCOP  05/30/2018 10:03 AM Oral Oncology Clinic [phone]

## 2018-05-30 NOTE — Progress Notes (Signed)
  Radiation Oncology         [phone]) [phone] ________________________________  Name: Austin Yu MRN: [id number]  Date: 05/30/2018  DOB: 1945-09-24  Optical Surface Tracking Plan:  Since intensity modulated radiotherapy (IMRT) and 3D conformal radiation treatment methods are predicated on accurate and precise positioning for treatment, intrafraction motion monitoring is medically necessary to ensure accurate and safe treatment delivery.  The ability to quantify intrafraction motion without excessive ionizing radiation dose can only be performed with optical surface tracking. Accordingly, surface imaging offers the opportunity to obtain 3D measurements of patient position throughout IMRT and 3D treatments without excessive radiation exposure.  I am ordering optical surface tracking for this patient's upcoming course of radiotherapy. ________________________________  Kyung Rudd, MD 05/30/2018 1:21 PM    Reference:   Particia Jasper, et al. Surface imaging-based analysis of intrafraction motion for breast radiotherapy patients.Journal of [place], n. 6, nov. 2014. ISSN [id number].   Available at: <http://www.jacmp.org/index.php/jacmp/article/view/4957>.

## 2018-05-30 NOTE — Addendum Note (Signed)
Encounter addended by: Hayden Pedro, PA-C on: 05/30/2018 6:21 AM  Actions taken: Follow-up modified, LOS modified

## 2018-05-30 NOTE — Progress Notes (Signed)
  Radiation Oncology         ([phone]) [phone] ________________________________  Name: Austin Yu MRN: [id number]  Date: 05/30/2018  DOB: 20-Aug-1945   SIMULATION AND TREATMENT PLANNING NOTE  DIAGNOSIS:     ICD-10-CM   1. Rectal cancer ([place]) C20      The patient presented for simulation for the patient's upcoming course of radiation for the diagnosis of rectal cancer. The patient was placed in a supine position. A customized vac-lock bag was constructed to aid in patient immobilization on. This complex treatment device will be used on a daily basis during the treatment. In this fashion a CT scan was obtained through the pelvic region and the isocenter was placed near midline within the pelvis. Surface markings were placed.  The patient's imaging was loaded into the radiation treatment planning system. The patient will initially be planned to receive a course of radiation to a dose of 45 Gy. This will be accomplished in 25 fractions at 1.8 gray per fraction. This initial treatment will correspond to a 3-D conformal technique. The target has been contoured in addition to the rectum, bladder and femoral heads. Dose volume histograms of each of these structures have been requested and these will be carefully reviewed as part of the 3-D conformal treatment planning process. To accomplish this initial treatment, 4 customized blocks have been designed for this purpose. Each of these 4 complex treatment devices will be used on a daily basis during the initial course of the treatment. It is anticipated that the patient will then receive a boost for an additional 5.4 Gy. The anticipated total dose therefore will be 50.4 Gy.    Special treatment procedure The patient will receive chemotherapy during the course of radiation treatment. The patient may experience increased or overlapping toxicity due to this combined-modality approach and the patient will be monitored for such problems. This may include extra  lab work as necessary. This therefore constitutes a special treatment procedure.    ________________________________  Jodelle Gross, MD, PhD

## 2018-05-31 ENCOUNTER — Other Ambulatory Visit: Payer: Self-pay | Admitting: Nurse Practitioner

## 2018-05-31 ENCOUNTER — Telehealth: Payer: Self-pay | Admitting: Nurse Practitioner

## 2018-05-31 ENCOUNTER — Telehealth: Payer: Self-pay | Admitting: Medical Oncology

## 2018-05-31 DIAGNOSIS — C20 Malignant neoplasm of rectum: Secondary | ICD-10-CM

## 2018-05-31 DIAGNOSIS — Z95828 Presence of other vascular implants and grafts: Secondary | ICD-10-CM

## 2018-05-31 NOTE — Telephone Encounter (Signed)
Pt stated he has a urology appt May 27th with Dr Denton Lank PA , Fritz Pickerel .

## 2018-05-31 NOTE — Telephone Encounter (Signed)
I contacted Mr. Null regarding possible right jugular thrombus on the recent chest CT.  He denies pain/swelling.  Order entered for venous Doppler right arm/neck 06/04/2018.  He understands to contact the office prior to that if he develops any concerning symptoms.

## 2018-06-04 ENCOUNTER — Other Ambulatory Visit: Payer: Self-pay

## 2018-06-04 ENCOUNTER — Ambulatory Visit (HOSPITAL_COMMUNITY)
Admission: RE | Admit: 2018-06-04 | Discharge: 2018-06-04 | Disposition: A | Discharge status: Home / Self Care | Payer: PPO | Source: Ambulatory Visit | Attending: Nurse Practitioner | Admitting: Nurse Practitioner

## 2018-06-04 ENCOUNTER — Telehealth: Payer: Self-pay | Admitting: *Deleted

## 2018-06-04 DIAGNOSIS — Z95828 Presence of other vascular implants and grafts: Secondary | ICD-10-CM | POA: Diagnosis not present

## 2018-06-04 DIAGNOSIS — C20 Malignant neoplasm of rectum: Secondary | ICD-10-CM

## 2018-06-04 DIAGNOSIS — Z510 Encounter for antineoplastic radiation therapy: Secondary | ICD-10-CM | POA: Diagnosis not present

## 2018-06-04 NOTE — Telephone Encounter (Signed)
Oral Chemotherapy Pharmacist Encounter   I spoke with patient for overview of: Xeloda (capecitabine) for the neoadjuvant treatment of metastatic rectal cancer in conjunction with radiation, planned duration 5 1/2 weeks of concurrent chemoradiation.   Counseled patient on administration, dosing, side effects, monitoring, drug-food interactions, safe handling, storage, and disposal.  Patient will take Xeloda 500mg  tablets, 3 tablets (1500mg ) by mouth in AM and 3 tabs (1500mg ) by mouth in PM, within 30 minutes of finishing meals, on days of radiation only.  Xeloda and radiation start date: 06/10/2018  Adverse effects of Xeloda include but are not limited to: fatigue, decreased blood counts, GI upset, diarrhea, mouth sores, and hand-foot syndrome.  Patient has anti-emetic on hand and knows to take it if nausea develops.   Patient will obtain anti diarrheal and alert the office of 4 or more loose stools above baseline.   Reviewed with patient importance of keeping a medication schedule and plan for any missed doses.  Medication reconciliation performed and medication/allergy list updated.  Insurance authorization for Xeloda was not required at this time. Test claim at the pharmacy revealed copayment $0 for entire course of Xeloda (#168 tablets). This will ship from the [facility] on 06/05/18 to deliver to patient's home on 06/06/18. Patient understands to wait until 06/10/2018 to take his 1st dose.  All questions answered.  Mr. Whack voiced understanding and appreciation.   Patient knows to call the office with questions or concerns.  Johny Drilling, PharmD, BCPS, BCOP  06/04/2018  11:55 AM Oral Oncology [facility] [phone]

## 2018-06-04 NOTE — Progress Notes (Signed)
Upper extremity venous has been completed.   Preliminary results in CV Proc.   Abram Sander 06/04/2018 11:17 AM

## 2018-06-04 NOTE — Telephone Encounter (Addendum)
Called to inquire about the doppler of right arm/neck that was ordered for today. Scheduled with vascular lab for today at 1045/1100. Patient notified of appointment and to register with admissions department. @ 11:15--Notified by vascular lab that he is positive for blood clot in right jugular vein. Per Dr. Benay Spice, send him home and we will start oral anti-coagulation.

## 2018-06-05 ENCOUNTER — Telehealth: Payer: Self-pay | Admitting: *Deleted

## 2018-06-05 ENCOUNTER — Other Ambulatory Visit: Payer: Self-pay | Admitting: Urology

## 2018-06-05 DIAGNOSIS — N202 Calculus of kidney with calculus of ureter: Secondary | ICD-10-CM | POA: Diagnosis not present

## 2018-06-05 DIAGNOSIS — N200 Calculus of kidney: Secondary | ICD-10-CM | POA: Diagnosis not present

## 2018-06-05 DIAGNOSIS — N401 Enlarged prostate with lower urinary tract symptoms: Secondary | ICD-10-CM | POA: Diagnosis not present

## 2018-06-05 DIAGNOSIS — R350 Frequency of micturition: Secondary | ICD-10-CM | POA: Diagnosis not present

## 2018-06-05 MED ORDER — APIXABAN 2.5 MG PO TABS: 2.5000 mg | ORAL_TABLET | Freq: Two times a day (BID) | ORAL | 2 refills | Status: DC

## 2018-06-05 MED FILL — CAPECITABINE 500 MG TABS: 500 | 38 days supply | Qty: 168 | Fill #0

## 2018-06-05 NOTE — Telephone Encounter (Signed)
Notified patient that MD wants him to begin Eliquis 2.5 mg bid for his DVT in jugular vein. Instructed him to stop drug and call MD for any rectal bleeding. Be sure to inform Dr. Dema Severin that he is now on a blood thinner. He has appointment there on 06/07/18.

## 2018-06-05 NOTE — Telephone Encounter (Signed)
Oral Oncology Patient Advocate Encounter  Confirmed with [facility] that Xeloda was shipped on 5/27 with a $0 copay.   [place] [facility] [place] Phone [phone] Fax [phone] 06/05/2018   3:30 PM

## 2018-06-05 NOTE — Patient Instructions (Addendum)
YOU ARE REQUIRED TO BE TESTED FOR COVID-19 PRIOR TO YOUR SURGERY . YOUR TEST MUST BE COMPLETED ON Thursday  Jun 06, 2018. TESTING IS LOCATED AT [facility] ENTRANCE FROM 9:00AM - 3:00PM. FAILURE TO COMPLETE TESTING MAY RESULT IN CANCELLATION OF YOUR SURGERY.                  KASHUS KARLEN  06/05/2018    Your procedure is scheduled on: 06-10-2018     Report to [facility] Main  Entrance     Report to admitting at 1:55PM      Call this number if you have problems the morning of surgery [phone]   Remember: Do not eat foodAfter Midnight. BRUSH YOUR TEETH MORNING OF SURGERY AND RINSE YOUR MOUTH OUT, NO CHEWING GUM CANDY OR MINTS.     Take these medicines the morning of surgery with A SIP OF WATER: pantoprazole                                 You may not have any metal on your body including hair pins and              piercings  Do not wear jewelry, make-up, lotions, powders or perfumes, deodorant                       Men may shave face and neck.   Do not bring valuables to the hospital. [place].  Contacts, dentures or bridgework may not be worn into surgery.  Leave suitcase in the car. After surgery it may be brought to your room.     Patients discharged the day of surgery will not be allowed to drive home. IF YOU ARE HAVING SURGERY AND GOING HOME THE SAME DAY, YOU MUST HAVE AN ADULT TO DRIVE YOU HOME AND BE WITH YOU FOR 24 HOURS. YOU MAY GO HOME BY TAXI OR UBER OR ORTHERWISE, BUT AN ADULT MUST ACCOMPANY YOU HOME AND STAY WITH YOU FOR 24 HOURS.  Name and phone number of your driver:  Special Instructions: N/A              Please read over the following fact sheets you were given: _____________________________________________________________________             [facility] - Preparing for Surgery Before surgery, you can play an important role.  Because skin is not sterile, your skin  needs to be as free of germs as possible.  You can reduce the number of germs on your skin by washing with CHG (chlorahexidine gluconate) soap before surgery.  CHG is an antiseptic cleaner which kills germs and bonds with the skin to continue killing germs even after washing. Please DO NOT use if you have an allergy to CHG or antibacterial soaps.  If your skin becomes reddened/irritated stop using the CHG and inform your nurse when you arrive at Short Stay. Do not shave (including legs and underarms) for at least 48 hours prior to the first CHG shower.  You may shave your face/neck. Please follow these instructions carefully:  1.  Shower with CHG Soap the night before surgery and the  morning of Surgery.  2.  If you choose to wash your hair, wash your hair first as usual  with your  normal  shampoo.  3.  After you shampoo, rinse your hair and body thoroughly to remove the  shampoo.                           4.  Use CHG as you would any other liquid soap.  You can apply chg directly  to the skin and wash                       Gently with a scrungie or clean washcloth.  5.  Apply the CHG Soap to your body ONLY FROM THE NECK DOWN.   Do not use on face/ open                           Wound or open sores. Avoid contact with eyes, ears mouth and genitals (private parts).                       Wash face,  Genitals (private parts) with your normal soap.             6.  Wash thoroughly, paying special attention to the area where your surgery  will be performed.  7.  Thoroughly rinse your body with warm water from the neck down.  8.  DO NOT shower/wash with your normal soap after using and rinsing off  the CHG Soap.                9.  Pat yourself dry with a clean towel.            10.  Wear clean pajamas.            11.  Place clean sheets on your bed the night of your first shower and do not  sleep with pets. Day of Surgery : Do not apply any lotions/deodorants the morning of surgery.  Please wear clean  clothes to the hospital/surgery center.  FAILURE TO FOLLOW THESE INSTRUCTIONS MAY RESULT IN THE CANCELLATION OF YOUR SURGERY PATIENT SIGNATURE_________________________________  NURSE SIGNATURE__________________________________  ________________________________________________________________________   YOU MAY HAVE CLEAR LIQUIDS FROM MIDNIGHT UNTIL 9:00AM. NOTHING BY MOUTH AFTER 9:00AM!   CLEAR LIQUID DIET   Foods Allowed                                                                     Foods Excluded  Coffee and tea, regular and decaf                             liquids that you cannot  Plain Jell-O in any flavor                                             see through such as: Fruit ices (not with fruit pulp)  milk, soups, orange juice  Iced Popsicles                                    All solid food Carbonated beverages, regular and diet                                    Cranberry, grape and apple juices Sports drinks like Gatorade Lightly seasoned clear broth or consume(fat free) Sugar, honey syrup  Sample Menu Breakfast                                Lunch                                     Supper Cranberry juice                    Beef broth                            Chicken broth Jell-O                                     Grape juice                           Apple juice Coffee or tea                        Jell-O                                      Popsicle                                                Coffee or tea                        Coffee or tea  _____________________________________________________________________

## 2018-06-06 ENCOUNTER — Encounter (HOSPITAL_COMMUNITY): Payer: Self-pay

## 2018-06-06 ENCOUNTER — Other Ambulatory Visit (HOSPITAL_COMMUNITY)
Admission: RE | Admit: 2018-06-06 | Discharge: 2018-06-06 | Disposition: A | Discharge status: Home / Self Care | Payer: PPO | Source: Ambulatory Visit | Attending: Urology | Admitting: Urology

## 2018-06-06 ENCOUNTER — Encounter (HOSPITAL_COMMUNITY)
Admission: RE | Admit: 2018-06-06 | Discharge: 2018-06-06 | Disposition: A | Discharge status: Home / Self Care | Payer: PPO | Source: Ambulatory Visit | Attending: Urology | Admitting: Urology

## 2018-06-06 ENCOUNTER — Other Ambulatory Visit: Payer: Self-pay

## 2018-06-06 DIAGNOSIS — N201 Calculus of ureter: Secondary | ICD-10-CM | POA: Insufficient documentation

## 2018-06-06 DIAGNOSIS — Z1159 Encounter for screening for other viral diseases: Secondary | ICD-10-CM | POA: Diagnosis not present

## 2018-06-06 DIAGNOSIS — Z01812 Encounter for preprocedural laboratory examination: Secondary | ICD-10-CM | POA: Diagnosis not present

## 2018-06-06 HISTORY — DX: Malignant neoplasm of rectum: C20

## 2018-06-06 HISTORY — DX: Tachycardia, unspecified: R00.0

## 2018-06-06 HISTORY — DX: Acute embolism and thrombosis of unspecified deep veins of unspecified lower extremity: I82.409

## 2018-06-06 HISTORY — DX: Dyspnea, unspecified: R06.00

## 2018-06-06 LAB — CBC
HCT: 42.8 % (ref 39.0–52.0)
Hemoglobin: 13.7 g/dL (ref 13.0–17.0)
MCH: 32.4 pg (ref 26.0–34.0)
MCHC: 32 g/dL (ref 30.0–36.0)
MCV: 101.2 fL — ABNORMAL HIGH (ref 80.0–100.0)
Platelets: 227 10*3/uL (ref 150–400)
RBC: 4.23 MIL/uL (ref 4.22–5.81)
RDW: 17.1 % — ABNORMAL HIGH (ref 11.5–15.5)
WBC: 12.1 10*3/uL — ABNORMAL HIGH (ref 4.0–10.5)
nRBC: 0 % (ref 0.0–0.2)

## 2018-06-06 LAB — BASIC METABOLIC PANEL
Anion gap: 13 (ref 5–15)
BUN: 19 mg/dL (ref 8–23)
CO2: 22 mmol/L (ref 22–32)
Calcium: 9 mg/dL (ref 8.9–10.3)
Chloride: 103 mmol/L (ref 98–111)
Creatinine, Ser: 1.45 mg/dL — ABNORMAL HIGH (ref 0.61–1.24)
GFR calc Af Amer: 56 mL/min — ABNORMAL LOW (ref >60)
GFR calc non Af Amer: 48 mL/min — ABNORMAL LOW (ref >60)
Glucose, Bld: 101 mg/dL — ABNORMAL HIGH (ref 70–99)
Potassium: 3.9 mmol/L (ref 3.5–5.1)
Sodium: 138 mmol/L (ref 135–145)

## 2018-06-06 NOTE — Progress Notes (Addendum)
Pre-op appt progress nurse porgress note   On eliquis for newly discovered right jugular dvt . Patient last taken dose was this morning. Per patient , was instructed by Dr Jeffie Pollock to continue  Eliquis for surgery    hx of sinus tach . HR  92 at pre-op. Patient informed nurse that  His oncologist told him that the increased heart rate was a side effect of his chemo drugs. Reports HR is slowly getting down to normal since completed of chemo. Patient denies acute cardiac sx today. Baseline sob d/t pulmonary fibrosis.

## 2018-06-07 LAB — NOVEL CORONAVIRUS, NAA (HOSP ORDER, SEND-OUT TO REF LAB; TAT 18-24 HRS): SARS-CoV-2, NAA: NOT DETECTED

## 2018-06-07 NOTE — Progress Notes (Signed)
SPOKE W/  Patient via phone     SCREENING SYMPTOMS OF COVID 19:   COUGH--no  RUNNY NOSE--- no  SORE THROAT---no  NASAL CONGESTION----no  SNEEZING----no  SHORTNESS OF BREATH---no  DIFFICULTY BREATHING---no  TEMP >100.0 -----no  UNEXPLAINED BODY ACHES------no  CHILLS -------- no  HEADACHES ---------no  LOSS OF SMELL/ TASTE --------no    HAVE YOU OR ANY FAMILY MEMBER TRAVELLED PAST 14 DAYS OUT OF THE   COUNTY---no STATE----no COUNTRY----no  HAVE YOU OR ANY FAMILY MEMBER BEEN EXPOSED TO ANYONE WITH COVID 19? no

## 2018-06-07 NOTE — Anesthesia Preprocedure Evaluation (Addendum)
Anesthesia Evaluation  Patient identified by MRN, date of birth, ID band Patient awake    Reviewed: Allergy & Precautions, NPO status , Patient's Chart, lab work & pertinent test results  History of Anesthesia Complications (+) history of anesthetic complications  Airway Mallampati: II  TM Distance: >3 FB Neck ROM: Full    Dental no notable dental hx. (+) Teeth Intact   Pulmonary shortness of breath,  Hx of Pulm fibrosis   Pulmonary exam normal breath sounds clear to auscultation       Cardiovascular Exercise Tolerance: Good + DVT  negative cardio ROS Normal cardiovascular exam Rhythm:Regular Rate:Normal     Neuro/Psych negative neurological ROS  negative psych ROS   GI/Hepatic GERD  ,  Endo/Other    Renal/GU Renal disease     Musculoskeletal   Abdominal   Peds  Hematology   Anesthesia Other Findings Hx of Metastatic rectal ca  Reproductive/Obstetrics                            Lab Results  Component Value Date   CREATININE 1.45 (H) 06/06/2018   BUN 19 06/06/2018   NA 138 06/06/2018   K 3.9 06/06/2018   CL 103 06/06/2018   CO2 22 06/06/2018    Lab Results  Component Value Date   WBC 12.1 (H) 06/06/2018   HGB 13.7 06/06/2018   HCT 42.8 06/06/2018   MCV 101.2 (H) 06/06/2018   PLT 227 06/06/2018    Anesthesia Physical Anesthesia Plan  ASA: III  Anesthesia Plan: General   Post-op Pain Management:    Induction: Intravenous  PONV Risk Score and Plan: Treatment may vary due to age or medical condition  Airway Management Planned: LMA  Additional Equipment:   Intra-op Plan:   Post-operative Plan:   Informed Consent: I have reviewed the patients History and Physical, chart, labs and discussed the procedure including the risks, benefits and alternatives for the proposed anesthesia with the patient or authorized representative who has indicated his/her understanding  and acceptance.     Dental advisory given  Plan Discussed with:   Anesthesia Plan Comments: (See PAT note 06/06/2018, Konrad Felix, PA-C)      Anesthesia Quick Evaluation

## 2018-06-07 NOTE — Progress Notes (Signed)
Anesthesia Chart Review   Case:  [id number] Date/Time:  06/10/18 1544   Procedure:  CYSTOSCOPY WITH RIGHT RETROGRADE PYELOGRAM, URETEROSCOPY HOLMIUM LASER AND STENT PLACEMENT (Right )   Anesthesia type:  Choice   Pre-op diagnosis:  RIGHT PROXIMAL AND DISTAL URETERAL STONE   Location:  [facility] ROOM 05 / [facility]   Surgeon:  Irine Seal, MD      DISCUSSION: 73 yo never smoker with h/o postinflammatory pulmonary fibrosis (follwed by Dr. Melvyn Novas, chronic cough, currently at baseline), HLD, right internal jugular DVT (on Eliquis), GERD, BPH, metastatic rectal cancer (currently on chemotherapy), right proximal and distal ureteral stone scheduled for above procedure 06/10/18 with Dr. Irine Seal.   Pt recently started on Eliquis due to acute deep vein thrombosis involving the right internal jugular on vascular US 06/04/2018.  He reports he has been advised by Dr. Jeffie Pollock not to hold Eliquis for procedure.  Followed by Dr. Betsy Coder.   Last seen by cardiologist, Dr. Vernell Leep, 05/07/2018 due to tachycardia which had resolved.  Per Dr. Bonney Roussel note echo with structurally normal heart with normal EF 04/23/2018- calculated EF 64%..  Advised to follow up prn.   Per Anesthesia note by Karoline Caldwell, PA-C prior to last procedure:  Review of anesthesia records shows pt had a difficult extubation 05/18/2015: "Mr. Mccartt had a rocky emergence from general anesthesia with coughing and refusal to breathe regularly. CXR obtained in PACU is without acute changes. Chronic interstitial fibrotic changes noted. He is now without complaint and oxygen saturation is 92-93% on room air. Breathing unlabored. He has been given an incentive spirometer. Discussed events with Mr. Kyne. He understands his pulmonary condition and will seek care if any new symptoms."  He again had GA on 06/01/2015 and per anesthesia post note: "Patient with pulmonary fibrosis, strong cough reflex and hyperactive upper airway. Tolerated deep general  anesthesia with intermittent coughing and breath holds through out. Would consider pre treatment with albuterol neb (even without wheezing) and nebulized lidocaine to decrease airway hyperactivity and upper airway reflexes."  S/p GA 02/07/2018 with no anesthesia complications noted.   VS: BP 114/73   Pulse 92   Resp 16   Ht 5\' 7"  (1.702 m)   Wt 81.3 kg   SpO2 95%   BMI 28.06 kg/m   PROVIDERS: Deland Pretty, MD is PCP   Vernell Leep, MD is Cardiologist   Betsy Coder, MD is Oncologist LABS: Labs reviewed: Acceptable for surgery. (all labs ordered are listed, but only abnormal results are displayed)  Labs Reviewed  BASIC METABOLIC PANEL - Abnormal; Notable for the following components:      Result Value   Glucose, Bld 101 (*)    Creatinine, Ser 1.45 (*)    GFR calc non Af Amer 48 (*)    GFR calc Af Amer 56 (*)    All other components within normal limits  CBC - Abnormal; Notable for the following components:   WBC 12.1 (*)    MCV 101.2 (*)    RDW 17.1 (*)    All other components within normal limits     IMAGES: CT Chest and Abdomen 05/28/2018 IMPRESSION: CT CHEST IMPRESSION  1. Since the prior chest CT of 04/19/2018, the left lower lobe pulmonary nodule is similar. Left infrahilar and mediastinal adenopathy is decreased, as detailed above. 2. Interstitial lung disease, most consistent with usual interstitial pneumonitis. 3. Suspicion of right jugular vein thrombus. Consider confirmation with dedicated ultrasound. 4.  Aortic Atherosclerosis (ICD10-I70.0). 5. Small  to moderate hiatal hernia.  CT ABDOMEN AND PELVIS IMPRESSION  1. Since the CT of the abdomen and pelvis of 04/02/2018, the right sided rectal wall thickening/mass is similar. A perirectal node is minimally decreased in size. 2. No new or progressive disease within the abdomen or pelvis. 3. Stones at the right ureteropelvic junction and distal right ureter are new, causing  hydroureteronephrosis. 4. Cholelithiasis. 5. Hepatic steatosis. 6.  Aortic Atherosclerosis (ICD10-I70.0).  06/04/2018 VAS [place]  Summary:   Right: No evidence of superficial vein thrombosis in the upper extremity. Findings consistent with acute deep vein thrombosis involving the right internal jugular veins.   Left: No evidence of thrombosis in the subclavian.  EKG: 04/04/2018 Rate 114 bpm Sinus tachycardia Consider left atrial enlargement Borderline left axis deviation Abnormal R-wave progression, late transition No significant change since last tracing  CV: Echocardiogram 04/23/2018: Left ventricle cavity is normal in size. Mild concentric hypertrophy of the left ventricle. Normal global wall motion. Doppler evidence of grade I (impaired) diastolic dysfunction, normal LAP. Calculated EF 64%. Mild (Grade I) aortic regurgitation. Inadequate TR jet to estimate pulmonary artery systolic pressure. Normal right atrial pressure. Past Medical History:  Diagnosis Date  . Abnormal chest sounds   . BPH (benign prostatic hypertrophy)   . Cancer [facility])    rectum cancer; dx 01/21/2018 by Dr. Loletha Carrow by colonoscopy  . Complication of anesthesia    ISSUE W/ EXTUBATION 05-18-2015 IN [facility] -- REFER TO ANESTHESIA NOTE''S;    . DVT (deep venous thrombosis) (H[facility])    right jugular vein. now on eliquis   . Dyspnea    ongoing d/t hx of pulmonary fibrosis   . Elevated PSA   . GERD (gastroesophageal reflux disease)   . History of airway aspiration   . History of kidney stones   . Hyperlipidemia   . Hyperplasia of prostate without lower urinary tract symptoms (LUTS)   . Left ureteral stone   . Lung nodule   . Nephrolithiasis   . Postinflammatory pulmonary fibrosis (HCC)    PULMOLOGIST-  DR CHYI-  IDIOPATHIC   . Pulmonary fibrosis ([place])   . Rectal cancer ([place])   . Sinus tachycardia by electrocardiogram    states he was told this was caused by chemo drugs; now compelted chemo, reports no acute  cardiac symtoms,   . Strawberry hemangioma of skin   . Upper airway cough syndrome    PULMOLOGIST-  DR FOYD    Past Surgical History:  Procedure Laterality Date  . BIOPSY  01/21/2018   Procedure: BIOPSY;  Surgeon: Doran Stabler, MD;  Location: [facility];  Service: Gastroenterology;;  . COLONOSCOPY WITH PROPOFOL N/A 01/21/2018   Procedure: COLONOSCOPY WITH PROPOFOL;  Surgeon: Doran Stabler, MD;  Location: [facility];  Service: Gastroenterology;  Laterality: N/A;  . CYSTOSCOPY WITH HOLMIUM LASER LITHOTRIPSY Left 06/01/2015   Procedure: CYSTOSCOPY WITH HOLMIUM LASER LITHOTRIPSY;  Surgeon: Irine Seal, MD;  Location: [facility];  Service: Urology;  Laterality: Left;  . CYSTOSCOPY WITH URETEROSCOPY, STONE BASKETRY AND STENT PLACEMENT Left 05/18/2015   Procedure: CYSTOSCOPY WITH STENT PLACEMENT AND RETROGRADE PYELOGRAM;  Surgeon: Irine Seal, MD;  Location[facility];  Service: Urology;  Laterality: Left;  . CYSTOSCOPY WITH URETEROSCOPY, STONE BASKETRY AND STENT PLACEMENT Left 06/01/2015   Procedure: CYSTOSCOPY WITH URETEROSCOPY, STONE BASKETRY AND STENT PLACEMENT;  Surgeon: Irine Seal, MD;  Location[facility];  Service: Urology;  Laterality: Left;  . EXTRACORPOREAL SHOCK WAVE LITHOTRIPSY  2007  .  IR IMAGING GUIDED PORT INSERTION  02/14/2018  . TONSILLECTOMY  age 37-4  . VIDEO BRONCHOSCOPY WITH ENDOBRONCHIAL NAVIGATION Left 02/07/2018   Procedure: VIDEO BRONCHOSCOPY WITH ENDOBRONCHIAL NAVIGATION;  Surgeon: Collene Gobble, MD;  Location: [facility];  Service: Thoracic;  Laterality: Left;  Marland Kitchen VIDEO BRONCHOSCOPY WITH ENDOBRONCHIAL ULTRASOUND Left 02/07/2018   Procedure: VIDEO BRONCHOSCOPY WITH ENDOBRONCHIAL ULTRASOUND;  Surgeon: Collene Gobble, MD;  Location: [facility];  Service: Thoracic;  Laterality: Left;    MEDICATIONS: . apixaban (ELIQUIS) 2.5 MG TABS tablet  . capecitabine (XELODA) 500 MG tablet  . famotidine (PEPCID) 20 MG tablet  . lidocaine-prilocaine (EMLA)  cream  . pantoprazole (PROTONIX) 40 MG tablet  . prochlorperazine (COMPAZINE) 10 MG tablet  . tamsulosin (FLOMAX) 0.4 MG CAPS capsule   No current facility-administered medications for this encounter.     Maia Plan [facility] Pre-Surgical Testing [phone] 06/07/18 3:48 PM

## 2018-06-09 ENCOUNTER — Other Ambulatory Visit: Payer: Self-pay | Admitting: Oncology

## 2018-06-10 ENCOUNTER — Ambulatory Visit
Admission: RE | Admit: 2018-06-10 | Discharge: 2018-06-10 | Disposition: A | Discharge status: Home / Self Care | Payer: PPO | Source: Ambulatory Visit | Attending: Radiation Oncology | Admitting: Radiation Oncology

## 2018-06-10 ENCOUNTER — Ambulatory Visit (HOSPITAL_COMMUNITY): Payer: PPO | Admitting: Physician Assistant

## 2018-06-10 ENCOUNTER — Inpatient Hospital Stay: Payer: PPO | Attending: Oncology

## 2018-06-10 ENCOUNTER — Ambulatory Visit (HOSPITAL_COMMUNITY): Payer: PPO

## 2018-06-10 ENCOUNTER — Other Ambulatory Visit: Payer: Self-pay

## 2018-06-10 ENCOUNTER — Encounter (HOSPITAL_COMMUNITY)
Admission: RE | Disposition: A | Discharge status: Home / Self Care | Payer: Self-pay | Source: Home / Self Care | Attending: Urology

## 2018-06-10 ENCOUNTER — Encounter (HOSPITAL_COMMUNITY): Payer: Self-pay

## 2018-06-10 ENCOUNTER — Ambulatory Visit (HOSPITAL_COMMUNITY)
Admission: RE | Admit: 2018-06-10 | Discharge: 2018-06-11 | Disposition: A | Discharge status: Home / Self Care | Payer: PPO | Attending: Urology | Admitting: Urology

## 2018-06-10 DIAGNOSIS — Z510 Encounter for antineoplastic radiation therapy: Secondary | ICD-10-CM | POA: Diagnosis not present

## 2018-06-10 DIAGNOSIS — C20 Malignant neoplasm of rectum: Secondary | ICD-10-CM | POA: Diagnosis not present

## 2018-06-10 DIAGNOSIS — Z79899 Other long term (current) drug therapy: Secondary | ICD-10-CM | POA: Insufficient documentation

## 2018-06-10 DIAGNOSIS — R3914 Feeling of incomplete bladder emptying: Secondary | ICD-10-CM | POA: Diagnosis not present

## 2018-06-10 DIAGNOSIS — Z9221 Personal history of antineoplastic chemotherapy: Secondary | ICD-10-CM | POA: Insufficient documentation

## 2018-06-10 DIAGNOSIS — R972 Elevated prostate specific antigen [PSA]: Secondary | ICD-10-CM | POA: Diagnosis not present

## 2018-06-10 DIAGNOSIS — N289 Disorder of kidney and ureter, unspecified: Secondary | ICD-10-CM | POA: Diagnosis not present

## 2018-06-10 DIAGNOSIS — Z7901 Long term (current) use of anticoagulants: Secondary | ICD-10-CM | POA: Insufficient documentation

## 2018-06-10 DIAGNOSIS — N200 Calculus of kidney: Secondary | ICD-10-CM

## 2018-06-10 DIAGNOSIS — R351 Nocturia: Secondary | ICD-10-CM | POA: Diagnosis not present

## 2018-06-10 DIAGNOSIS — N401 Enlarged prostate with lower urinary tract symptoms: Secondary | ICD-10-CM | POA: Insufficient documentation

## 2018-06-10 DIAGNOSIS — N202 Calculus of kidney with calculus of ureter: Secondary | ICD-10-CM | POA: Diagnosis not present

## 2018-06-10 DIAGNOSIS — J8410 Pulmonary fibrosis, unspecified: Secondary | ICD-10-CM | POA: Diagnosis not present

## 2018-06-10 DIAGNOSIS — C3432 Malignant neoplasm of lower lobe, left bronchus or lung: Secondary | ICD-10-CM | POA: Diagnosis not present

## 2018-06-10 DIAGNOSIS — I82C11 Acute embolism and thrombosis of right internal jugular vein: Secondary | ICD-10-CM | POA: Insufficient documentation

## 2018-06-10 DIAGNOSIS — D696 Thrombocytopenia, unspecified: Secondary | ICD-10-CM | POA: Diagnosis not present

## 2018-06-10 DIAGNOSIS — C3490 Malignant neoplasm of unspecified part of unspecified bronchus or lung: Secondary | ICD-10-CM | POA: Insufficient documentation

## 2018-06-10 DIAGNOSIS — R000 Tachycardia, unspecified: Secondary | ICD-10-CM | POA: Diagnosis not present

## 2018-06-10 DIAGNOSIS — N3030 Trigonitis without hematuria: Secondary | ICD-10-CM | POA: Insufficient documentation

## 2018-06-10 DIAGNOSIS — K219 Gastro-esophageal reflux disease without esophagitis: Secondary | ICD-10-CM | POA: Diagnosis not present

## 2018-06-10 DIAGNOSIS — Z86718 Personal history of other venous thrombosis and embolism: Secondary | ICD-10-CM | POA: Insufficient documentation

## 2018-06-10 DIAGNOSIS — J84112 Idiopathic pulmonary fibrosis: Secondary | ICD-10-CM | POA: Diagnosis not present

## 2018-06-10 HISTORY — PX: CYSTOSCOPY WITH RETROGRADE PYELOGRAM, URETEROSCOPY AND STENT PLACEMENT: SHX5789

## 2018-06-10 LAB — CBC WITH DIFFERENTIAL (CANCER CENTER ONLY)
Abs Immature Granulocytes: 0.04 10*3/uL (ref 0.00–0.07)
Basophils Absolute: 0.2 10*3/uL — ABNORMAL HIGH (ref 0.0–0.1)
Basophils Relative: 2 %
Eosinophils Absolute: 0.1 10*3/uL (ref 0.0–0.5)
Eosinophils Relative: 1 %
HCT: 43.5 % (ref 39.0–52.0)
Hemoglobin: 14 g/dL (ref 13.0–17.0)
Immature Granulocytes: 0 %
Lymphocytes Relative: 20 %
Lymphs Abs: 2 10*3/uL (ref 0.7–4.0)
MCH: 32 pg (ref 26.0–34.0)
MCHC: 32.2 g/dL (ref 30.0–36.0)
MCV: 99.5 fL (ref 80.0–100.0)
Monocytes Absolute: 1 10*3/uL (ref 0.1–1.0)
Monocytes Relative: 10 %
Neutro Abs: 6.9 10*3/uL (ref 1.7–7.7)
Neutrophils Relative %: 67 %
Platelet Count: 282 10*3/uL (ref 150–400)
RBC: 4.37 MIL/uL (ref 4.22–5.81)
RDW: 16.3 % — ABNORMAL HIGH (ref 11.5–15.5)
WBC Count: 10.2 10*3/uL (ref 4.0–10.5)
nRBC: 0 % (ref 0.0–0.2)

## 2018-06-10 SURGERY — CYSTOURETEROSCOPY, WITH RETROGRADE PYELOGRAM AND STENT INSERTION
Anesthesia: General | Site: Ureter | Laterality: Right

## 2018-06-10 MED ORDER — MORPHINE SULFATE (PF) 2 MG/ML IV SOLN: 1.0000 mg | INTRAVENOUS | Status: DC | PRN

## 2018-06-10 MED ORDER — DEXAMETHASONE SODIUM PHOSPHATE 10 MG/ML IJ SOLN
INTRAMUSCULAR | Status: AC
  Filled 2018-06-10: qty 1

## 2018-06-10 MED ORDER — SODIUM CHLORIDE 0.9 % IV SOLN
2.0000 g | Freq: Every day | INTRAVENOUS | Status: DC
  Administered 2018-06-10: 2 g via INTRAVENOUS
  Filled 2018-06-10: qty 2

## 2018-06-10 MED ORDER — PHENYLEPHRINE 40 MCG/ML (10ML) SYRINGE FOR IV PUSH (FOR BLOOD PRESSURE SUPPORT)
PREFILLED_SYRINGE | INTRAVENOUS | Status: AC
  Filled 2018-06-10: qty 10

## 2018-06-10 MED ORDER — DEXAMETHASONE SODIUM PHOSPHATE 4 MG/ML IJ SOLN
INTRAMUSCULAR | Status: DC | PRN
  Administered 2018-06-10: 5 mg via INTRAVENOUS

## 2018-06-10 MED ORDER — LIDOCAINE 2% (20 MG/ML) 5 ML SYRINGE
INTRAMUSCULAR | Status: DC | PRN
  Administered 2018-06-10: 100 mg via INTRAVENOUS

## 2018-06-10 MED ORDER — FENTANYL CITRATE (PF) 100 MCG/2ML IJ SOLN
INTRAMUSCULAR | Status: DC | PRN
  Administered 2018-06-10 (×2): 50 ug via INTRAVENOUS
  Administered 2018-06-10 (×2): 25 ug via INTRAVENOUS
  Administered 2018-06-10: 50 ug via INTRAVENOUS

## 2018-06-10 MED ORDER — FAMOTIDINE 20 MG PO TABS
20.0000 mg | ORAL_TABLET | Freq: Every day | ORAL | Status: DC
  Administered 2018-06-10: 20 mg via ORAL
  Filled 2018-06-10: qty 1

## 2018-06-10 MED ORDER — ALBUTEROL SULFATE (2.5 MG/3ML) 0.083% IN NEBU: 2.5000 mg | INHALATION_SOLUTION | Freq: Once | RESPIRATORY_TRACT | Status: DC

## 2018-06-10 MED ORDER — ONDANSETRON HCL 4 MG/2ML IJ SOLN
INTRAMUSCULAR | Status: AC
  Filled 2018-06-10: qty 2

## 2018-06-10 MED ORDER — TAMSULOSIN HCL 0.4 MG PO CAPS
0.4000 mg | ORAL_CAPSULE | Freq: Every day | ORAL | Status: DC
  Administered 2018-06-10: 0.4 mg via ORAL
  Filled 2018-06-10: qty 1

## 2018-06-10 MED ORDER — SENNA 8.6 MG PO TABS
1.0000 | ORAL_TABLET | Freq: Two times a day (BID) | ORAL | Status: DC
  Administered 2018-06-11: 8.6 mg via ORAL
  Filled 2018-06-10: qty 1

## 2018-06-10 MED ORDER — FENTANYL CITRATE (PF) 100 MCG/2ML IJ SOLN
INTRAMUSCULAR | Status: AC
  Filled 2018-06-10: qty 2

## 2018-06-10 MED ORDER — DOCUSATE SODIUM 100 MG PO CAPS
100.0000 mg | ORAL_CAPSULE | Freq: Two times a day (BID) | ORAL | Status: DC
  Administered 2018-06-11: 100 mg via ORAL
  Filled 2018-06-10: qty 1

## 2018-06-10 MED ORDER — DEXTROSE-NACL 5-0.45 % IV SOLN
INTRAVENOUS | Status: AC
  Administered 2018-06-10: 18:00:00 via INTRAVENOUS

## 2018-06-10 MED ORDER — PANTOPRAZOLE SODIUM 40 MG PO TBEC
40.0000 mg | DELAYED_RELEASE_TABLET | Freq: Every day | ORAL | Status: DC
  Administered 2018-06-11: 40 mg via ORAL
  Filled 2018-06-10: qty 1

## 2018-06-10 MED ORDER — PROPOFOL 10 MG/ML IV BOLUS
INTRAVENOUS | Status: AC
  Filled 2018-06-10: qty 20

## 2018-06-10 MED ORDER — BACITRACIN-NEOMYCIN-POLYMYXIN 400-5-5000 EX OINT: 1.0000 "application " | TOPICAL_OINTMENT | Freq: Three times a day (TID) | CUTANEOUS | Status: DC | PRN

## 2018-06-10 MED ORDER — PHENYLEPHRINE 40 MCG/ML (10ML) SYRINGE FOR IV PUSH (FOR BLOOD PRESSURE SUPPORT)
PREFILLED_SYRINGE | INTRAVENOUS | Status: DC | PRN
  Administered 2018-06-10 (×4): 80 ug via INTRAVENOUS

## 2018-06-10 MED ORDER — LACTATED RINGERS IV SOLN
INTRAVENOUS | Status: DC
  Administered 2018-06-10: 15:00:00 via INTRAVENOUS

## 2018-06-10 MED ORDER — APIXABAN 2.5 MG PO TABS
2.5000 mg | ORAL_TABLET | Freq: Two times a day (BID) | ORAL | Status: DC
  Administered 2018-06-10 – 2018-06-11 (×2): 2.5 mg via ORAL
  Filled 2018-06-10 (×2): qty 1

## 2018-06-10 MED ORDER — EPHEDRINE 5 MG/ML INJ
INTRAVENOUS | Status: AC
  Filled 2018-06-10: qty 10

## 2018-06-10 MED ORDER — CEFAZOLIN SODIUM-DEXTROSE 2-4 GM/100ML-% IV SOLN
2.0000 g | INTRAVENOUS | Status: AC
  Administered 2018-06-10: 2 g via INTRAVENOUS

## 2018-06-10 MED ORDER — SODIUM CHLORIDE 0.9 % IR SOLN
Status: DC | PRN
  Administered 2018-06-10: 3000 mL

## 2018-06-10 MED ORDER — CEFAZOLIN SODIUM-DEXTROSE 2-4 GM/100ML-% IV SOLN
INTRAVENOUS | Status: AC
  Filled 2018-06-10: qty 100

## 2018-06-10 MED ORDER — OXYCODONE HCL 5 MG PO TABS: 5.0000 mg | ORAL_TABLET | ORAL | Status: DC | PRN

## 2018-06-10 MED ORDER — ALBUTEROL SULFATE (2.5 MG/3ML) 0.083% IN NEBU
INHALATION_SOLUTION | RESPIRATORY_TRACT | Status: AC
  Administered 2018-06-10: 2.5 mg
  Filled 2018-06-10: qty 3

## 2018-06-10 MED ORDER — PROCHLORPERAZINE MALEATE 10 MG PO TABS: 10.0000 mg | ORAL_TABLET | Freq: Four times a day (QID) | ORAL | Status: DC | PRN

## 2018-06-10 MED ORDER — PROPOFOL 10 MG/ML IV BOLUS
INTRAVENOUS | Status: DC | PRN
  Administered 2018-06-10: 180 mg via INTRAVENOUS

## 2018-06-10 MED ORDER — ACETAMINOPHEN 500 MG PO TABS
1000.0000 mg | ORAL_TABLET | Freq: Four times a day (QID) | ORAL | Status: DC
  Administered 2018-06-10: 1000 mg via ORAL
  Filled 2018-06-10: qty 2

## 2018-06-10 MED ORDER — ONDANSETRON HCL 4 MG/2ML IJ SOLN
INTRAMUSCULAR | Status: DC | PRN
  Administered 2018-06-10: 4 mg via INTRAVENOUS

## 2018-06-10 MED ORDER — EPHEDRINE SULFATE-NACL 50-0.9 MG/10ML-% IV SOSY
PREFILLED_SYRINGE | INTRAVENOUS | Status: DC | PRN
  Administered 2018-06-10 (×2): 5 mg via INTRAVENOUS

## 2018-06-10 SURGICAL SUPPLY — 25 items
BAG URO CATCHER STRL LF (MISCELLANEOUS) ×3 IMPLANT
BASKET STONE NCOMPASS (UROLOGICAL SUPPLIES) IMPLANT
CATH URET 5FR 28IN OPEN ENDED (CATHETERS) IMPLANT
CATH URET DUAL LUMEN 6-10FR 50 (CATHETERS) ×3 IMPLANT
CLOTH BEACON ORANGE TIMEOUT ST (SAFETY) ×3 IMPLANT
COVER WAND RF STERILE (DRAPES) IMPLANT
EXTRACTOR STONE NITINOL NGAGE (UROLOGICAL SUPPLIES) ×5 IMPLANT
FIBER LASER FLEXIVA 1000 (UROLOGICAL SUPPLIES) IMPLANT
FIBER LASER FLEXIVA 365 (UROLOGICAL SUPPLIES) ×2 IMPLANT
FIBER LASER FLEXIVA 550 (UROLOGICAL SUPPLIES) IMPLANT
FIBER LASER TRAC TIP (UROLOGICAL SUPPLIES) IMPLANT
GLOVE SURG SS PI 8.0 STRL IVOR (GLOVE) IMPLANT
GOWN STRL REUS W/TWL XL LVL3 (GOWN DISPOSABLE) ×3 IMPLANT
GUIDEWIRE STR DUAL SENSOR (WIRE) ×3 IMPLANT
IV NS 1000ML (IV SOLUTION) ×3
IV NS 1000ML BAXH (IV SOLUTION) ×1 IMPLANT
IV NS IRRIG 3000ML ARTHROMATIC (IV SOLUTION) ×3 IMPLANT
KIT TURNOVER KIT A (KITS) IMPLANT
MANIFOLD NEPTUNE II (INSTRUMENTS) ×3 IMPLANT
PACK CYSTO (CUSTOM PROCEDURE TRAY) ×3 IMPLANT
SHEATH URETERAL 12FRX35CM (MISCELLANEOUS) IMPLANT
STENT URET 6FRX26 CONTOUR (STENTS) ×2 IMPLANT
TUBING CONNECTING 10 (TUBING) ×2 IMPLANT
TUBING CONNECTING 10' (TUBING) ×1
TUBING UROLOGY SET (TUBING) ×3 IMPLANT

## 2018-06-10 NOTE — Anesthesia Procedure Notes (Signed)
Procedure Name: LMA Insertion Date/Time: 06/10/2018 4:13 PM Performed by: Claudia Desanctis, CRNA Pre-anesthesia Checklist: Emergency Drugs available, Patient identified, Suction available and Patient being monitored Patient Re-evaluated:Patient Re-evaluated prior to induction Oxygen Delivery Method: Circle system utilized Preoxygenation: Pre-oxygenation with 100% oxygen Induction Type: IV induction Ventilation: Mask ventilation without difficulty LMA: LMA inserted and LMA with gastric port inserted LMA Size: 4.0 Number of attempts: 1 Placement Confirmation: positive ETCO2 and breath sounds checked- equal and bilateral Tube secured with: Tape Dental Injury: Teeth and Oropharynx as per pre-operative assessment

## 2018-06-10 NOTE — Anesthesia Postprocedure Evaluation (Signed)
Anesthesia Post Note  Patient: Austin Yu  Procedure(s) Performed: CYSTOSCOPY WITH RIGHT RETROGRADE PYELOGRAM, URETEROSCOPY HOLMIUM LASER AND STENT PLACEMENT basket extraction stone (Right Ureter)     Patient location during evaluation: PACU Anesthesia Type: General Level of consciousness: awake and alert Pain management: pain level controlled Vital Signs Assessment: post-procedure vital signs reviewed and stable Respiratory status: spontaneous breathing, nonlabored ventilation, respiratory function stable and patient connected to nasal cannula oxygen Cardiovascular status: blood pressure returned to baseline and stable Postop Assessment: no apparent nausea or vomiting Anesthetic complications: no    Last Vitals:  Vitals:   06/10/18 1812 06/10/18 2001  BP: 121/84 121/83  Pulse: 90 87  Resp: 18 16  Temp: 36.5 C 36.6 C  SpO2: 98% 100%    Last Pain:  Vitals:   06/10/18 2001  TempSrc: Oral  PainSc:                  Barnet [place]

## 2018-06-10 NOTE — Op Note (Signed)
Procedure: 1.  Cystoscopy with right retrograde pyelogram and interpretation. 2.  Right ureteroscopic stone extraction with holmium laser application and insertion of right double-J stent.  Preop diagnosis: 1.  Right distal ureteral stone. 2.  Right UPJ stone. 3.  Right renal stone.  Postop diagnosis: Same.  With extensive follicular cystitis.  Surgeon: Dr. Irine Seal.  Resident: Dr. Arminda Resides.  Anesthesia: General.  Specimen: Stone fragments and right renal pelvic culture.  Drain: 6 [place] by 26 cm right double-J stent without tether and 16 French Foley catheter.  EBL: None.  Complications: None.  Indications: Austin Yu is a 73 year old male who was found on recent imaging to have a 9 mm right distal ureteral stone with obstruction and a 16 mm right UPJ stone also with obstruction.  There was an upper pole renal stone as well.  He had a urine culture on 5/29 that grew multiple species and Bactrim was called in for the patient.  He is to undergo for stage of a probable stage ureteroscopy with laser and stone extraction.  He has a complicated medical history with rectal and lung cancer and had a recent diagnosis of the DVT in the jugular vein and is on Eliquis which she could not stop for the procedure.  Procedure: He was given 2 g of Ancef.  He was taken to the operating room where general anesthetic was induced.  He was placed in lithotomy position and fitted with PAS hose.  His perineum and genitalia were prepped with Betadine solution and he was draped in usual sterile fashion.  Cystoscopy was then performed using the 23 [place] scope and 30 degree lens.  Examination revealed a normal urethra.  The external sphincter was intact.  The prostatic urethra was approximately 3 cm in length with primarily bilobar hyperplasia.  Inspection of the bladder revealed diffuse changes consistent with follicular cystitis and the urine was slightly cloudy.  Ureteral orifice ease were unremarkable.  There  was a small Hutch diverticulum on the left.  The left retrograde pyelogram was performed using a 6 [place] open-ended catheter and Omnipaque.  The left retrograde pyelogram demonstrated a normal caliber ureter to the stone in the mid portion of the distal ureter.  The stone appeared impacted but contrast did flow into the ureter above the stone where there was more significant dilation.  A Glidewire was then used to negotiate the open-ended catheter by the stone.  Additional contrast was instilled demonstrating a filling defect in the proximal ureter consistent with the known 16 mm stone.  There was some contrast that passed beyond the stone into a dilated renal collecting system.  With the use of the Glidewire to aid passage, the open-ended catheter was advanced into the kidney.  Urine was aspirated for culture but it appeared quite clear.  A sensor wire was then advanced to the kidney through the open-ended catheter which was then removed.  The 6.5 French semirigid ureteroscope was then advanced alongside the wire without the need for dilation.  The stone was visualized in the distal ureter and engaged with a 365 m holmium laser fiber with the power settings on 0.6 J and 6 Hz.  The stone broke readily into manageable fragments which were then removed with the engage basket.  At this point ureteroscope was removed and the cystoscope was reinserted over the wire.  A 6 French by 26 cm contour double-J stent was advanced to the kidney under fluoroscopic guidance over the wire without difficulty.  The wire was  removed, leaving a good coil in the kidney and a good coil in the bladder.  The stone fragments were then removed from the bladder and the cystoscope was removed.  A 16 French Foley catheter was inserted and the balloon was inflated with 10 mL of sterile fluid.  The catheter was placed to straight drainage.  He was taken down from lithotomy position, his anesthetic was reversed and he was moved recovery  room in stable condition.  I did not attempt to treat the UPJ and renal stones because of the inflammatory changes in the bladder and also the consistency of the stone in the distal ureter was most suggestive of struvite.  He will be kept overnight for observation under antibiotic coverage with plan to remove the Foley in the morning.  There were no complications.

## 2018-06-10 NOTE — Transfer of Care (Signed)
Immediate Anesthesia Transfer of Care Note  Patient: Austin Yu  Procedure(s) Performed: CYSTOSCOPY WITH RIGHT RETROGRADE PYELOGRAM, URETEROSCOPY HOLMIUM LASER AND STENT PLACEMENT basket extraction stone (Right Ureter)  Patient Location: PACU  Anesthesia Type:General  Level of Consciousness: awake, alert , oriented and patient cooperative  Airway & Oxygen Therapy: Patient Spontanous Breathing and Patient connected to face mask  Post-op Assessment: Report given to RN and Post -op Vital signs reviewed and stable  Post vital signs: Reviewed and stable  Last Vitals:  Vitals Value Taken Time  BP 114/80 06/10/2018  5:04 PM  Temp 36.4 C 06/10/2018  5:04 PM  Pulse 110 06/10/2018  5:06 PM  Resp 19 06/10/2018  5:06 PM  SpO2 100 % 06/10/2018  5:06 PM  Vitals shown include unvalidated device data.  Last Pain:  Vitals:   06/10/18 1704  TempSrc:   PainSc: Asleep         Complications: No apparent anesthesia complications

## 2018-06-10 NOTE — Interval H&P Note (Signed)
History and Physical Interval Note:    06/10/2018 3:59 PM  Austin Yu  has presented today for surgery, with the diagnosis of RIGHT PROXIMAL AND DISTAL URETERAL STONE.  The various methods of treatment have been discussed with the patient and family. After consideration of risks, benefits and other options for treatment, the patient has consented to  Procedure(s): CYSTOSCOPY WITH RIGHT RETROGRADE PYELOGRAM, URETEROSCOPY HOLMIUM LASER AND STENT PLACEMENT (Right) as a surgical intervention.  The patient's history has been reviewed, patient examined, no change in status, stable for surgery.  I have reviewed the patient's chart and labs.  Questions were answered to the patient's satisfaction.     Irine Seal

## 2018-06-10 NOTE — H&P (Signed)
CC/HPI: 06/05/2018: Prior history of BPH with lower urinary tract symptoms managed with tamsulosin, UTI, nephrolithiasis, and elevated PSA.   Unfortunately the patient has had quite a tumultuous year in regards to medical history since our last evaluation. Diagnosed with stage IIIB, [id number] adenocarcinoma of the rectum in January of this year. He has already completed 6 cycles of chemotherapy with plans for possible tumor resection and additional chemo/radiation treatment in the near future. At the same time as his rectal cancer diagnosis, stage III lung cancer also diagnosed in the left lower lobe with lymph node involvement. This is being aggressively followed as well. Lastly patient was diagnosed within the past week with a DVT of right jugular vein and has been started on Eliquis was 2.5 mg twice a day.   Reason for follow-up today extends from recent CT of the chest/abdomen/pelvis revealing an approximately 6.6 x 9 mm distal right ureteral calculi as well as a larger 11.5 x17.35mm calculus in the right UPJ with associated obstructive signs. The patient has not had any right-sided pain or discomfort, gross hematuria, painful urination. Denies any interval stone material passed since last urological evaluation. Denies any associated or recent fever/chills, he is tolerating a normal diet. He has some mild SOB and underlying cough but no significant dyspnea with exertion. Denies chest pain or discomfort, light headedness or dizziness.   He continues on tamsulosin for obstructive voiding symptoms and when remembering to take it he has good force of stream with non-bothersome daytime frequency and stable nocturia.     ALLERGIES: No Allergies    MEDICATIONS: Tamsulosin Hcl 0.4 mg capsule 1 unspec PO Q HS  Famotidine 20 mg tablet Oral  Pantoprazole Sodium 40 mg tablet, delayed release Oral     GU PSH: Cystoscopy Insert Stent - 2017 Ureteroscopic laser litho - 2017 Ureteroscopic stone removal -  2017      PSH Notes: Cystoscopy With Ureteroscopy With Removal Of Calculus, Cystoscopy Ureteroscopy Lithotripsy Incl Insert Indwelling Ureter Stent, Cystoscopy With Insertion Of Ureteral Stent Left, No Surgical Problems, rectal cancer, lung cancer     Radiation Notes: Starts radiation 05/2018 for cancer   NON-GU PSH: None   GU PMH: History of urolithiasis - 05/23/2017 Elevated PSA, He has an elevated PSA with a benign but enlarged prostate. I am going to have him return this afternoon with a UA for culture because of his prior history of UTI's. If there is evidence of infection, even if it is multiple species, I will get him a month of antibiotics and repeat the PSA. I have requested additional PSA's from Dr. Shelia Media for comparison and if this is an isolated elevation, I will probably get a confirmatory level before considering a biopsy and I may want to get an MRIP of the prostate to assess his risk before doing a biopsy particular in light of his history of pulmonary fibrosis. - 04/11/2017 Personal Hx Urinary Tract Infections, He had a UTI at his last visit and was unable to get a UA today. He will return tomorrow to give a specimen for UA and culture. - 11/27/2016 Renal and ureteral calculus - 2017 Renal calculus, Nephrolithiasis - 2017 Ureteral calculus, Calculus of distal left ureter - 2017 BPH w/LUTS, Benign prostatic hypertrophy (BPH) with incomplete bladder emptying - 2017 Urinary Tract Inf, Unspec site, Pyuria - 2017 Other microscopic hematuria, Microscopic hematuria - 2017    NON-GU PMH: Encounter for general adult medical examination without abnormal findings, Encounter for preventive health examination - 2017 Personal  history of other diseases of the digestive system, History of esophageal reflux - 2017 Pulmonary fibrosis, unspecified    FAMILY HISTORY: Congestive Heart Failure - Runs In Family Death of family member - Runs In Family No pertinent family history - Runs In Family    SOCIAL HISTORY: Marital Status: Single Preferred Language: English; Ethnicity: Not Hispanic Or Latino; Race: White Current Smoking Status: Patient has never smoked.   Tobacco Use Assessment Completed: Used Tobacco in last 30 days?     Notes: Alcohol use, No caffeine use, Single, Number of children, Never a smoker   REVIEW OF SYSTEMS:    GU Review Male:   Patient denies frequent urination, hard to postpone urination, burning/ pain with urination, get up at night to urinate, leakage of urine, stream starts and stops, trouble starting your stream, have to strain to urinate , erection problems, and penile pain.  Gastrointestinal (Upper):   Patient denies nausea, vomiting, and indigestion/ heartburn.  Gastrointestinal (Lower):   Patient denies diarrhea and constipation.  Constitutional:   Patient denies fever, night sweats, weight loss, and fatigue.  Skin:   Patient denies skin rash/ lesion and itching.  Eyes:   Patient denies blurred vision and double vision.  Ears/ Nose/ Throat:   Patient denies sore throat and sinus problems.  Hematologic/Lymphatic:   Patient denies swollen glands and easy bruising.  Cardiovascular:   Patient denies leg swelling and chest pains.  Respiratory:   Patient reports cough and shortness of breath.   Endocrine:   Patient denies excessive thirst.  Musculoskeletal:   Patient denies back pain and joint pain.  Neurological:   Patient denies headaches and dizziness.  Psychologic:   Patient denies depression and anxiety.   VITAL SIGNS:      06/05/2018 10:27 AM  Weight 182 lb / 82.55 kg  BP 108/73 mmHg  Pulse 99 /min  Temperature 98.2 F / 36.7 C   MULTI-SYSTEM PHYSICAL EXAMINATION:    Constitutional: Well-nourished. No physical deformities. Normally developed. Good grooming.  Neck: Neck symmetrical, not swollen. Normal tracheal position.  Respiratory: Normal breath sounds. No labored breathing, no use of accessory muscles.   Cardiovascular: Regular rate and  rhythm. No murmur, no gallop. Normal temperature, normal extremity pulses, no swelling, no varicosities.   Skin: No paleness, no jaundice, no cyanosis. No lesion, no ulcer, no rash.  Neurologic / Psychiatric: Oriented to time, oriented to place, oriented to person. No depression, no anxiety, no agitation.  Gastrointestinal: No mass, no tenderness, no rigidity, non obese abdomen. No CVA, flank, lower abdominal tenderness.  Musculoskeletal: Normal gait and station of head and neck.     PAST DATA REVIEWED:  Source Of History:  Patient, Medical Record Summary  Records Review:   Previous Doctor Records, Previous Hospital Records, Previous Patient Records  Urine Test Review:   Urinalysis  X-Ray Review: C.T. Chest/ Abd/Pelvis: Reviewed Films. Reviewed Report. Discussed With Patient.     05/16/17 07/13/05  PSA  Total PSA 3.11 ng/mL 2.07     06/05/18  Urinalysis  Urine Appearance Turbid   Urine Color Yellow   Urine Glucose Neg mg/dL  Urine Bilirubin Neg mg/dL  Urine Ketones Neg mg/dL  Urine Specific Gravity 1.015   Urine Blood 2+ ery/uL  Urine pH 7.5   Urine Protein 1+ mg/dL  Urine Urobilinogen 0.2 mg/dL  Urine Nitrites Neg   Urine Leukocyte Esterase 3+ leu/uL  Urine WBC/hpf >60/hpf   Urine RBC/hpf 10 - 20/hpf   Urine Epithelial Cells NS (Not Seen)  Urine Bacteria Many (>50/hpf)   Urine Mucous Not Present   Urine Yeast NS (Not Seen)   Urine Trichomonas Not Present   Urine Cystals NS (Not Seen)   Urine Casts NS (Not Seen)   Urine Sperm Not Present   Notes:                     Creatinine on 5/20 was 1.33   PROCEDURES:          Urinalysis w/Scope Dipstick Dipstick Cont'd Micro  Color: Yellow Bilirubin: Neg mg/dL WBC/hpf: >60/hpf  Appearance: Turbid Ketones: Neg mg/dL RBC/hpf: 10 - 20/hpf  Specific Gravity: 1.015 Blood: 2+ ery/uL Bacteria: Many (>50/hpf)  pH: 7.5 Protein: 1+ mg/dL Cystals: NS (Not Seen)  Glucose: Neg mg/dL Urobilinogen: 0.2 mg/dL Casts: NS (Not Seen)     Nitrites: Neg Trichomonas: Not Present    Leukocyte Esterase: 3+ leu/uL Mucous: Not Present      Epithelial Cells: NS (Not Seen)      Yeast: NS (Not Seen)      Sperm: Not Present    ASSESSMENT:      ICD-10 Details  1 GU:   Renal and ureteral calculus - N20.2 Right  2   BPH w/LUTS - N40.1 Stable   PLAN:           Orders Labs Urine Culture          Schedule Return Visit/Planned Activity: Next Available Appointment - Schedule Surgery          Document Letter(s):  Created for Patient: Clinical Summary         Notes:   Recent CT imaging reviewed with the patient's urologist. He has an obstructing right UPJ stone as well as smaller and somewhat less obstructive stone noted in the right distal ureter. Renal function preserved and patient grossly asymptomatic. He is tolerating chemotherapy for metastatic rectal cancer well and remains grossly asymptomatic from the newly diagnosed obstructing ureteral calculi. He will continue tamsulosin in the interval and findings this does well and maintaining good stream as well as limiting bothersome frequency/nocturia.   Unfortunately patient has just started anticoagulation therapy for newly diagnosed DVT of the right jugular vein. This will have to be taken in consideration for upcoming procedure. Staged ureteroscopy recommended with treatment of the distal stone first with placement of ureteral stent with follow-up procedure to treat the more proximal calculi at a later date.  We discussed ureteroscopic stone manipulation with basketing and laser-lithotripsy in detail. We discussed risks including bleeding, infection, damage to kidney / ureter bladder, rarely loss of kidney. We discussed anesthetic risks and rare but serious surgical complications including DVT, PE, MI, and mortality. We specifically addressed that in 5-10% of cases a staged approach is required with stenting followed by re-attempt ureteroscopy if anatomy unfavorable. The patient voiced  understanding and wises to proceed.  RTC instructions given for worsening LUTS, pain/discomfort, f/c, interval stone material passage.           Next Appointment:      Next Appointment: 06/17/2018 02:00 PM    Appointment Type: Postoperative Appointment    Location: [facility] [phone]    Provider: Irine Seal, M.D.    Reason for Visit: POST OP

## 2018-06-11 ENCOUNTER — Other Ambulatory Visit: Payer: Self-pay

## 2018-06-11 ENCOUNTER — Encounter (HOSPITAL_COMMUNITY): Payer: Self-pay | Admitting: Urology

## 2018-06-11 ENCOUNTER — Ambulatory Visit
Admission: RE | Admit: 2018-06-11 | Discharge: 2018-06-11 | Disposition: A | Discharge status: Home / Self Care | Payer: PPO | Source: Ambulatory Visit | Attending: Radiation Oncology | Admitting: Radiation Oncology

## 2018-06-11 DIAGNOSIS — N209 Urinary calculus, unspecified: Secondary | ICD-10-CM | POA: Diagnosis not present

## 2018-06-11 DIAGNOSIS — C20 Malignant neoplasm of rectum: Secondary | ICD-10-CM | POA: Diagnosis not present

## 2018-06-11 DIAGNOSIS — N202 Calculus of kidney with calculus of ureter: Secondary | ICD-10-CM | POA: Diagnosis not present

## 2018-06-11 DIAGNOSIS — Z510 Encounter for antineoplastic radiation therapy: Secondary | ICD-10-CM | POA: Diagnosis not present

## 2018-06-11 LAB — HEMOGLOBIN AND HEMATOCRIT, BLOOD
HCT: 38.5 % — ABNORMAL LOW (ref 39.0–52.0)
Hemoglobin: 12.2 g/dL — ABNORMAL LOW (ref 13.0–17.0)

## 2018-06-11 LAB — BASIC METABOLIC PANEL
Anion gap: 8 (ref 5–15)
BUN: 13 mg/dL (ref 8–23)
CO2: 21 mmol/L — ABNORMAL LOW (ref 22–32)
Calcium: 8.7 mg/dL — ABNORMAL LOW (ref 8.9–10.3)
Chloride: 106 mmol/L (ref 98–111)
Creatinine, Ser: 1.24 mg/dL (ref 0.61–1.24)
GFR calc Af Amer: >60 mL/min (ref >60)
GFR calc non Af Amer: 58 mL/min — ABNORMAL LOW (ref >60)
Glucose, Bld: 192 mg/dL — ABNORMAL HIGH (ref 70–99)
Potassium: 4.5 mmol/L (ref 3.5–5.1)
Sodium: 135 mmol/L (ref 135–145)

## 2018-06-11 MED ORDER — SULFAMETHOXAZOLE-TRIMETHOPRIM 400-80 MG PO TABS: 2.0000 | ORAL_TABLET | Freq: Two times a day (BID) | ORAL | 0 refills | Status: AC

## 2018-06-11 NOTE — Discharge Summary (Addendum)
[facility] Discharge Summary  Admit date: 06/10/2018  Discharge date and time: 06/11/18   Discharge to: Home  Discharge Service: Urology  Discharge Attending Physician:  Irine Seal, MD  Discharge  Diagnoses: Nephrolithiasis  Secondary Diagnosis: Active Problems:   Nephrolithiasis   OR Procedures: Procedure(s): CYSTOSCOPY WITH RIGHT RETROGRADE PYELOGRAM, URETEROSCOPY HOLMIUM LASER AND STENT PLACEMENT basket extraction stone 06/10/2018   Ancillary Procedures: None   Discharge Day Services: The patient was seen and examined by the Urology team both in the morning and immediately prior to discharge.  Vital signs and laboratory values were stable and within normal limits.  The physical exam was benign and unchanged and all surgical wounds were examined.  Discharge instructions were explained and all questions answered.  Subjective  No acute events overnight. Pain Controlled. No fever or chills.  Objective Patient Vitals for the past 8 hrs:  BP Temp Temp src Pulse Resp SpO2  06/11/18 0534 98/70 97.6 F (36.4 C) Oral 70 16 98 %  06/11/18 0029 108/66 97.6 F (36.4 C) - 78 20 100 %   Total I/O In: 1265.8 [P.O.:477; I.V.:688.8; IV Piggyback:100] Out: 950 [Urine:950]  General Appearance:        No acute distress Lungs:                       Normal work of breathing on room air Heart:                                Regular rate and rhythm Abdomen:                         Soft, non-tender, non-distended Extremities:                      Warm and well perfused GU: urine clear yellow with some sediment in tubing   Hospital Course:  The patient underwent cystoscopy with right ureteral stone extraction with laser lithotripsy and right ureteral stent placement on 06/10/2018.  The patient tolerated the procedure well, was extubated in the OR, and afterwards was taken to the PACU for routine post-surgical care. When stable the patient was transferred to the floor.   The patient did well  postoperatively. The patient's diet was slowly advanced and at the time of discharge was tolerating a diet. The patient was discharged home 1 Day Post-Op, at which point was tolerating a regular solid diet, have adequate pain control with P.O. pain medication, and could ambulate without difficulty. His foley was removed prior to discharge, but due to his radiation appointment, he did not undergo a formal trial of void. He was instructed to call the urology clinic this afternoon if he has issues voiding. The patient will follow up with [place] for post op check.   Condition at Discharge: Improved  Discharge Medications:  Allergies as of 06/11/2018   No Known Allergies     Medication List    TAKE these medications   apixaban 2.5 MG Tabs tablet Commonly known as:  ELIQUIS Take 1 tablet (2.5 mg total) by mouth 2 (two) times daily.   capecitabine 500 MG tablet Commonly known as:  Xeloda Take 3 tablets (1500mg ) by mouth 2 times daily, immediately after AM & PM meals. Take on radiation days only, M-F   famotidine 20 MG tablet Commonly known as:  Pepcid Take 1 tablet (20 mg  total) by mouth at bedtime.   lidocaine-prilocaine cream Commonly known as:  EMLA Apply to Port-A-Cath 1 to 2 hours prior to use   pantoprazole 40 MG tablet Commonly known as:  PROTONIX Take 1 tablet (40 mg total) by mouth daily.   prochlorperazine 10 MG tablet Commonly known as:  COMPAZINE Take 1 tablet (10 mg total) by mouth every 6 (six) hours as needed for nausea or vomiting.   sulfamethoxazole-trimethoprim 400-80 MG tablet Commonly known as:  BACTRIM Take 2 tablets by mouth 2 (two) times daily for 14 days.   tamsulosin 0.4 MG Caps capsule Commonly known as:  FLOMAX Take 0.4 mg by mouth at bedtime.

## 2018-06-12 ENCOUNTER — Other Ambulatory Visit: Payer: Self-pay

## 2018-06-12 ENCOUNTER — Ambulatory Visit
Admission: RE | Admit: 2018-06-12 | Discharge: 2018-06-12 | Disposition: A | Discharge status: Home / Self Care | Payer: PPO | Source: Ambulatory Visit | Attending: Radiation Oncology | Admitting: Radiation Oncology

## 2018-06-12 DIAGNOSIS — C20 Malignant neoplasm of rectum: Secondary | ICD-10-CM | POA: Diagnosis not present

## 2018-06-12 DIAGNOSIS — Z510 Encounter for antineoplastic radiation therapy: Secondary | ICD-10-CM | POA: Diagnosis not present

## 2018-06-13 ENCOUNTER — Other Ambulatory Visit: Payer: Self-pay

## 2018-06-13 ENCOUNTER — Ambulatory Visit
Admission: RE | Admit: 2018-06-13 | Discharge: 2018-06-13 | Disposition: A | Discharge status: Home / Self Care | Payer: PPO | Source: Ambulatory Visit | Attending: Radiation Oncology | Admitting: Radiation Oncology

## 2018-06-13 DIAGNOSIS — Z510 Encounter for antineoplastic radiation therapy: Secondary | ICD-10-CM | POA: Diagnosis not present

## 2018-06-13 DIAGNOSIS — C20 Malignant neoplasm of rectum: Secondary | ICD-10-CM | POA: Diagnosis not present

## 2018-06-13 LAB — URINE CULTURE: Culture: 50000 — AB

## 2018-06-14 ENCOUNTER — Ambulatory Visit
Admission: RE | Admit: 2018-06-14 | Discharge: 2018-06-14 | Disposition: A | Discharge status: Home / Self Care | Payer: PPO | Source: Ambulatory Visit | Attending: Radiation Oncology | Admitting: Radiation Oncology

## 2018-06-14 ENCOUNTER — Other Ambulatory Visit: Payer: Self-pay

## 2018-06-14 DIAGNOSIS — C20 Malignant neoplasm of rectum: Secondary | ICD-10-CM | POA: Diagnosis not present

## 2018-06-14 DIAGNOSIS — Z510 Encounter for antineoplastic radiation therapy: Secondary | ICD-10-CM | POA: Diagnosis not present

## 2018-06-17 ENCOUNTER — Other Ambulatory Visit: Payer: Self-pay

## 2018-06-17 ENCOUNTER — Ambulatory Visit
Admission: RE | Admit: 2018-06-17 | Discharge: 2018-06-17 | Disposition: A | Discharge status: Home / Self Care | Payer: PPO | Source: Ambulatory Visit | Attending: Radiation Oncology | Admitting: Radiation Oncology

## 2018-06-17 DIAGNOSIS — N3021 Other chronic cystitis with hematuria: Secondary | ICD-10-CM | POA: Diagnosis not present

## 2018-06-17 DIAGNOSIS — Z510 Encounter for antineoplastic radiation therapy: Secondary | ICD-10-CM | POA: Diagnosis not present

## 2018-06-17 DIAGNOSIS — C20 Malignant neoplasm of rectum: Secondary | ICD-10-CM | POA: Diagnosis not present

## 2018-06-17 DIAGNOSIS — N200 Calculus of kidney: Secondary | ICD-10-CM | POA: Diagnosis not present

## 2018-06-18 ENCOUNTER — Other Ambulatory Visit: Payer: Self-pay

## 2018-06-18 ENCOUNTER — Institutional Professional Consult (permissible substitution): Payer: PPO | Admitting: Thoracic Surgery (Cardiothoracic Vascular Surgery)

## 2018-06-18 ENCOUNTER — Encounter: Payer: Self-pay | Admitting: Thoracic Surgery (Cardiothoracic Vascular Surgery)

## 2018-06-18 ENCOUNTER — Ambulatory Visit
Admission: RE | Admit: 2018-06-18 | Discharge: 2018-06-18 | Disposition: A | Discharge status: Home / Self Care | Payer: PPO | Source: Ambulatory Visit | Attending: Radiation Oncology | Admitting: Radiation Oncology

## 2018-06-18 VITALS — BP 107/72 | HR 91 | Temp 96.3°F | Resp 16 | Ht 67.0 in | Wt 179.0 lb

## 2018-06-18 DIAGNOSIS — J849 Interstitial pulmonary disease, unspecified: Secondary | ICD-10-CM

## 2018-06-18 DIAGNOSIS — R911 Solitary pulmonary nodule: Secondary | ICD-10-CM | POA: Diagnosis not present

## 2018-06-18 DIAGNOSIS — C20 Malignant neoplasm of rectum: Secondary | ICD-10-CM

## 2018-06-18 DIAGNOSIS — Z510 Encounter for antineoplastic radiation therapy: Secondary | ICD-10-CM | POA: Diagnosis not present

## 2018-06-18 NOTE — Progress Notes (Signed)
PCP is Deland Pretty, MD Referring Provider is Ladell Pier, MD  Chief Complaint  Patient presents with  . Lung Lesion    LLLobe.Marland KitchenMarland KitchenCTA CHEST 4/10/CTA C/A/P 5/19 /PET in January...laast PFT 06/06/16...EBUS per Dr. Malvin Johns 02/07/18    HPI: Austin Yu is sent for consultation regarding stage IIIa lung cancer.  Austin Yu is a 73 year old man with a history of pulmonary fibrosis, reflux, colon cancer, lung cancer, hyperlipidemia, and DVT.  He was found to have colon cancer back in January.  As part of his work-up he had a PET/CT.  He was also found to have a hypermetabolic lung nodule.  Bronchoscopy showed adenocarcinoma.  Needle aspirations of level 7 node were positive as well.  Initially this was thought to possibly be metastatic, but ultimately was determined to be a lung primary.  He has completed chemotherapy for his rectal cancer and has started radiation.  Plan is for resection ultimately.  He does get some shortness of breath with walking but is able to go up a flight of stairs without difficulty.  He says he was diagnosed with pulmonary fibrosis about for 5 years ago.  He is a never smoker.  Zubrod Score: At the time of surgery this patient's most appropriate activity status/level should be described as: []     0    Normal activity, no symptoms [x]     1    Restricted in physical strenuous activity but ambulatory, able to do out light work []     2    Ambulatory and capable of self care, unable to do work activities, up and about >50 % of waking hours                              []     3    Only limited self care, in bed greater than 50% of waking hours []     4    Completely disabled, no self care, confined to bed or chair []     5    Moribund  Past Medical History:  Diagnosis Date  . Abnormal chest sounds   . BPH (benign prostatic hypertrophy)   . Cancer [facility])    rectum cancer; dx 01/21/2018 by Dr. Loletha Carrow by colonoscopy  . Complication of anesthesia    ISSUE W/ EXTUBATION  05-18-2015 IN [facility] -- REFER TO ANESTHESIA NOTE''S;    . DVT (deep venous thrombosis) (HCC)    right jugular vein. now on eliquis   . Dyspnea    ongoing d/t hx of pulmonary fibrosis   . Elevated PSA   . GERD (gastroesophageal reflux disease)   . History of airway aspiration   . History of kidney stones   . Hyperlipidemia   . Hyperplasia of prostate without lower urinary tract symptoms (LUTS)   . Left ureteral stone   . Lung nodule   . Nephrolithiasis   . Postinflammatory pulmonary fibrosis (HCC)    PULMOLOGIST-  DR UJWJ-  IDIOPATHIC   . Pulmonary fibrosis ([place])   . Rectal cancer ([place])   . Sinus tachycardia by electrocardiogram    states he was told this was caused by chemo drugs; now compelted chemo, reports no acute cardiac symtoms,   . Strawberry hemangioma of skin   . Upper airway cough syndrome    PULMOLOGIST-  DR XBJY    Past Surgical History:  Procedure Laterality Date  . BIOPSY  01/21/2018   Procedure: BIOPSY;  Surgeon: Loletha Carrow,  Kirke Corin, MD;  Location: [facility];  Service: Gastroenterology;;  . COLONOSCOPY WITH PROPOFOL N/A 01/21/2018   Procedure: COLONOSCOPY WITH PROPOFOL;  Surgeon: Doran Stabler, MD;  Location: [facility];  Service: Gastroenterology;  Laterality: N/A;  . CYSTOSCOPY WITH HOLMIUM LASER LITHOTRIPSY Left 06/01/2015   Procedure: CYSTOSCOPY WITH HOLMIUM LASER LITHOTRIPSY;  Surgeon: Irine Seal, MD;  Location: [facility];  Service: Urology;  Laterality: Left;  . CYSTOSCOPY WITH RETROGRADE PYELOGRAM, URETEROSCOPY AND STENT PLACEMENT Right 06/10/2018   Procedure: CYSTOSCOPY WITH RIGHT RETROGRADE PYELOGRAM, URETEROSCOPY HOLMIUM LASER AND STENT PLACEMENT basket extraction stone;  Surgeon: Irine Seal, MD;  Location: [facility];  Service: Urology;  Laterality: Right;  . CYSTOSCOPY WITH URETEROSCOPY, STONE BASKETRY AND STENT PLACEMENT Left 05/18/2015   Procedure: CYSTOSCOPY WITH STENT PLACEMENT AND RETROGRADE PYELOGRAM;  Surgeon: Irine Seal, MD;   Location: [facility];  Service: Urology;  Laterality: Left;  . CYSTOSCOPY WITH URETEROSCOPY, STONE BASKETRY AND STENT PLACEMENT Left 06/01/2015   Procedure: CYSTOSCOPY WITH URETEROSCOPY, STONE BASKETRY AND STENT PLACEMENT;  Surgeon: Irine Seal, MD;  Location: [facility];  Service: Urology;  Laterality: Left;  . EXTRACORPOREAL SHOCK WAVE LITHOTRIPSY  2007  . IR IMAGING GUIDED PORT INSERTION  02/14/2018  . TONSILLECTOMY  age [age]  . VIDEO BRONCHOSCOPY WITH ENDOBRONCHIAL NAVIGATION Left 02/07/2018   Procedure: VIDEO BRONCHOSCOPY WITH ENDOBRONCHIAL NAVIGATION;  Surgeon: Collene Gobble, MD;  Location: [place];  Service: Thoracic;  Laterality: Left;  Marland Kitchen VIDEO BRONCHOSCOPY WITH ENDOBRONCHIAL ULTRASOUND Left 02/07/2018   Procedure: VIDEO BRONCHOSCOPY WITH ENDOBRONCHIAL ULTRASOUND;  Surgeon: Collene Gobble, MD;  Location: [facility];  Service: Thoracic;  Laterality: Left;    Family History  Problem Relation Age of Onset  . Heart failure Father        CHF  . Healthy Brother   . Healthy Daughter   . Healthy Daughter     Social History Social History   Tobacco Use  . Smoking status: Never Smoker  . Smokeless tobacco: Never Used  Substance Use Topics  . Alcohol use: Not Currently    Alcohol/week: 0.0 standard drinks  . Drug use: No    Current Outpatient Medications  Medication Sig Dispense Refill  . apixaban (ELIQUIS) 2.5 MG TABS tablet Take 1 tablet (2.5 mg total) by mouth 2 (two) times daily. 60 tablet 2  . capecitabine (XELODA) 500 MG tablet Take 3 tablets (1500mg ) by mouth 2 times daily, immediately after AM & PM meals. Take on radiation days only, M-F 168 tablet 0  . famotidine (PEPCID) 20 MG tablet Take 1 tablet (20 mg total) by mouth at bedtime. 90 tablet 3  . pantoprazole (PROTONIX) 40 MG tablet Take 1 tablet (40 mg total) by mouth daily. 30 tablet 5  . prochlorperazine (COMPAZINE) 10 MG tablet Take 1 tablet (10 mg total) by mouth every 6 (six) hours as needed for nausea or  vomiting. 30 tablet 2  . sulfamethoxazole-trimethoprim (BACTRIM) 400-80 MG tablet Take 2 tablets by mouth 2 (two) times daily for 14 days. 56 tablet 0  . tamsulosin (FLOMAX) 0.4 MG CAPS capsule Take 0.4 mg by mouth at bedtime.      No current facility-administered medications for this visit.     No Known Allergies  Review of Systems  Constitutional: Positive for unexpected weight change (Lost 5 pounds in 3 months). Negative for activity change.  HENT: Negative for trouble swallowing and voice change.   Eyes: Negative for visual disturbance.  Respiratory: Positive for  cough and shortness of breath. Negative for wheezing.   Cardiovascular: Negative for chest pain.  Gastrointestinal: Positive for abdominal pain (Reflux). Negative for nausea.  Genitourinary: Negative for dysuria.       Kidney stones  Musculoskeletal: Negative for arthralgias and myalgias.  Neurological: Negative for syncope and weakness.    BP 107/72 (BP Location: Right Arm, Patient Position: Sitting, Cuff Size: Normal)   Pulse 91   Temp (!) 96.3 F (35.7 C) (Skin)   Resp 16   Ht 5\' 7"  (1.702 m)   Wt 179 lb (81.2 kg)   SpO2 91% Comment: RA  BMI 28.04 kg/m  Physical Exam Vitals signs reviewed.  Constitutional:      General: He is not in acute distress.    Appearance: Normal appearance. He is not ill-appearing.  HENT:     Head: Normocephalic and atraumatic.     Comments: Wearing surgical mask Cardiovascular:     Rate and Rhythm: Normal rate and regular rhythm.     Heart sounds: No murmur.  Pulmonary:     Effort: Pulmonary effort is normal. No respiratory distress.     Breath sounds: Rales (Bilaterally) present. No wheezing.  Abdominal:     General: There is no distension.     Palpations: Abdomen is soft.     Tenderness: There is no abdominal tenderness.  Musculoskeletal:        General: No swelling.  Lymphadenopathy:     Cervical: No cervical adenopathy.  Skin:    General: Skin is warm and dry.   Neurological:     General: No focal deficit present.     Mental Status: He is alert and oriented to person, place, and time.     Diagnostic Tests: CT CHEST, ABDOMEN, AND PELVIS WITH CONTRAST  TECHNIQUE: Multidetector CT imaging of the chest, abdomen and pelvis was performed following the standard protocol during bolus administration of intravenous contrast.  CONTRAST:  12mL OMNIPAQUE IOHEXOL 300 MG/ML  SOLN  COMPARISON:  04/19/2018 chest CT. 04/02/2018 abdominopelvic CT. PET of 02/01/2018.  FINDINGS: CT CHEST FINDINGS  Cardiovascular: Right Port-A-Cath tip at high right atrium.  Suspicion of thrombus within the right jugular vein including on image 2/2. Normal caliber of the aorta and branch vessels. Mild cardiomegaly, without pericardial effusion. No central pulmonary embolism, on this non-dedicated study.  Mediastinum/Nodes: No supraclavicular adenopathy. Low left periesophageal node measures 10 mm on image 28/2 versus 11 mm on 04/19/2018.  A node within the azygoesophageal recess measures 10 mm on image 28/2 versus 1.4 cm on the prior.  Left infrahilar nodal mass measures 1.6 x 2.1 cm on image 31/2. Compare 2.0 x 2.1 cm on the prior (when remeasured).  Small to moderate hiatal hernia.  Lungs/Pleura: No pleural fluid. Interstitial lung disease, with basilar and subpleural predominant reticulation, architectural distortion, and traction bronchiectasis/bronchiolectasis.  1.5 by 1.2 cm left lower lobe pulmonary nodule on image 103/4. This is within an area of focal pulmonary fibrosis. Compare 1.4 x 1.3 cm on the 04/19/2018 CT (when remeasured).  Musculoskeletal: No acute osseous abnormality.  CT ABDOMEN PELVIS FINDINGS  Hepatobiliary: Mild hepatic steatosis, without focal liver lesion. Gallstones of up to 2.0 cm without acute cholecystitis or biliary duct dilatation.  Pancreas: Normal, without mass or ductal dilatation.  Spleen: Normal in  size, without focal abnormality.  Adrenals/Urinary Tract: Normal adrenal glands. Moderate right renal cortical thinning. Interpolar minimally complex right renal cyst of 1.4 cm. Bilateral too small to characterize renal lesions. Upper pole left renal cyst  or minimally complex cyst of 1.7 cm.  Multiple bilateral renal collecting system calculi. A stone at the right ureteropelvic junction is new, including at 1.7 by 1.6 cm cm on image 70/2. This results in moderate right-sided caliectasis.  Minimal right hydroureter more distally, with a distal 9 mm ureteric stone on image 105/2. This is new. Normal urinary bladder.  Stomach/Bowel: Normal remainder of the stomach. Eccentric right rectal wall thickening is similar, including at 1.3 cm on image 116/2. No obstruction.  Scattered colonic diverticula. Normal terminal ileum and appendix. Normal small bowel.  Vascular/Lymphatic: Aortic and branch vessel atherosclerosis. No abdominal adenopathy.  The right perirectal node is decreased in size, 2-3 mm on image 115/2. 4 mm on 04/02/2018.  Reproductive: Mild prostatomegaly.  Other: No significant free fluid. No evidence of omental or peritoneal disease.  Musculoskeletal: Degenerate disc disease, primarily at L3-4.  IMPRESSION: CT CHEST IMPRESSION  1. Since the prior chest CT of 04/19/2018, the left lower lobe pulmonary nodule is similar. Left infrahilar and mediastinal adenopathy is decreased, as detailed above. 2. Interstitial lung disease, most consistent with usual interstitial pneumonitis. 3. Suspicion of right jugular vein thrombus. Consider confirmation with dedicated ultrasound. 4.  Aortic Atherosclerosis (ICD10-I70.0). 5. Small to moderate hiatal hernia.  CT ABDOMEN AND PELVIS IMPRESSION  1. Since the CT of the abdomen and pelvis of 04/02/2018, the right sided rectal wall thickening/mass is similar. A perirectal node is minimally decreased in size. 2. No  new or progressive disease within the abdomen or pelvis. 3. Stones at the right ureteropelvic junction and distal right ureter are new, causing hydroureteronephrosis. 4. Cholelithiasis. 5. Hepatic steatosis. 6.  Aortic Atherosclerosis (ICD10-I70.0).  These results will be called to the ordering clinician or representative by the Radiologist Assistant, and communication documented in the PACS or zVision Dashboard.   Electronically Signed   By: Abigail Miyamoto M.D.   On: 05/29/2018 11:57   I personally reviewed the CT and PET/CT images and concur with the findings noted above  Impression: Austin Yu is a 73 year old man with simultaneous rectal cancer and stage IIIa adenocarcinoma of the lung.  He also has a history of pulmonary fibrosis, reflux, and DVT.  He was found to have a lung cancer during his work-up of his rectal cancer.  He currently is being treated with radiation and chemotherapy for his rectal cancer before planned surgical resection.  Back in February or March he had bronchoscopy and endobronchial ultrasound which showed adenocarcinoma of the lung with involvement of level 7 lymph nodes.  His CT shows severe pulmonary fibrosis.  His FEV1 2 years ago was 1.9 (65% of predicted) but his diffusion capacity was only 44% of predicted.  Reviewing his films he would require a pneumonectomy for complete resection.  That is generally poor idea and stage IIIa disease.  In his case I do not think he would be a candidate for that given his impaired diffusion capacity I think he would have an unacceptable quality of life if he were to survive the operation.   Plan: Recommend treating stage IIIa lung cancer with definitive chemoradiation  Melrose Nakayama, MD [facility] [phone]

## 2018-06-19 ENCOUNTER — Other Ambulatory Visit: Payer: Self-pay

## 2018-06-19 ENCOUNTER — Encounter: Payer: PPO | Admitting: Thoracic Surgery (Cardiothoracic Vascular Surgery)

## 2018-06-19 ENCOUNTER — Ambulatory Visit
Admission: RE | Admit: 2018-06-19 | Discharge: 2018-06-19 | Disposition: A | Discharge status: Home / Self Care | Payer: PPO | Source: Ambulatory Visit | Attending: Radiation Oncology | Admitting: Radiation Oncology

## 2018-06-19 DIAGNOSIS — Z510 Encounter for antineoplastic radiation therapy: Secondary | ICD-10-CM | POA: Diagnosis not present

## 2018-06-19 DIAGNOSIS — C20 Malignant neoplasm of rectum: Secondary | ICD-10-CM | POA: Diagnosis not present

## 2018-06-20 ENCOUNTER — Ambulatory Visit
Admission: RE | Admit: 2018-06-20 | Discharge: 2018-06-20 | Disposition: A | Discharge status: Home / Self Care | Payer: PPO | Source: Ambulatory Visit | Attending: Radiation Oncology | Admitting: Radiation Oncology

## 2018-06-20 ENCOUNTER — Other Ambulatory Visit: Payer: Self-pay

## 2018-06-20 DIAGNOSIS — C20 Malignant neoplasm of rectum: Secondary | ICD-10-CM | POA: Diagnosis not present

## 2018-06-20 DIAGNOSIS — Z510 Encounter for antineoplastic radiation therapy: Secondary | ICD-10-CM | POA: Diagnosis not present

## 2018-06-20 LAB — CALCULI, WITH PHOTOGRAPH (CLINICAL LAB)
Calcium Oxalate Monohydrate: 10 %
Carbonate Apatite: 40 %
Mg NH4 PO4 (Struvite): 50 %
Weight Calculi: 81 mg

## 2018-06-21 ENCOUNTER — Other Ambulatory Visit: Payer: Self-pay | Admitting: *Deleted

## 2018-06-21 ENCOUNTER — Other Ambulatory Visit: Payer: Self-pay

## 2018-06-21 ENCOUNTER — Ambulatory Visit
Admission: RE | Admit: 2018-06-21 | Discharge: 2018-06-21 | Disposition: A | Discharge status: Home / Self Care | Payer: PPO | Source: Ambulatory Visit | Attending: Radiation Oncology | Admitting: Radiation Oncology

## 2018-06-21 DIAGNOSIS — C20 Malignant neoplasm of rectum: Secondary | ICD-10-CM | POA: Diagnosis not present

## 2018-06-21 DIAGNOSIS — Z510 Encounter for antineoplastic radiation therapy: Secondary | ICD-10-CM | POA: Diagnosis not present

## 2018-06-24 ENCOUNTER — Inpatient Hospital Stay: Payer: PPO

## 2018-06-24 ENCOUNTER — Ambulatory Visit
Admission: RE | Admit: 2018-06-24 | Discharge: 2018-06-24 | Disposition: A | Discharge status: Home / Self Care | Payer: PPO | Source: Ambulatory Visit | Attending: Radiation Oncology | Admitting: Radiation Oncology

## 2018-06-24 ENCOUNTER — Other Ambulatory Visit: Payer: Self-pay

## 2018-06-24 ENCOUNTER — Inpatient Hospital Stay (HOSPITAL_BASED_OUTPATIENT_CLINIC_OR_DEPARTMENT_OTHER): Payer: PPO | Admitting: Nurse Practitioner

## 2018-06-24 ENCOUNTER — Encounter: Payer: Self-pay | Admitting: Nurse Practitioner

## 2018-06-24 VITALS — BP 129/77 | HR 78 | Temp 98.2°F | Resp 18 | Ht 67.0 in | Wt 179.2 lb

## 2018-06-24 DIAGNOSIS — D696 Thrombocytopenia, unspecified: Secondary | ICD-10-CM | POA: Diagnosis not present

## 2018-06-24 DIAGNOSIS — I82C11 Acute embolism and thrombosis of right internal jugular vein: Secondary | ICD-10-CM | POA: Diagnosis not present

## 2018-06-24 DIAGNOSIS — C3490 Malignant neoplasm of unspecified part of unspecified bronchus or lung: Secondary | ICD-10-CM | POA: Diagnosis not present

## 2018-06-24 DIAGNOSIS — C20 Malignant neoplasm of rectum: Secondary | ICD-10-CM | POA: Diagnosis not present

## 2018-06-24 DIAGNOSIS — N289 Disorder of kidney and ureter, unspecified: Secondary | ICD-10-CM

## 2018-06-24 DIAGNOSIS — Z79899 Other long term (current) drug therapy: Secondary | ICD-10-CM | POA: Diagnosis not present

## 2018-06-24 DIAGNOSIS — J8410 Pulmonary fibrosis, unspecified: Secondary | ICD-10-CM

## 2018-06-24 DIAGNOSIS — Z7901 Long term (current) use of anticoagulants: Secondary | ICD-10-CM | POA: Diagnosis not present

## 2018-06-24 DIAGNOSIS — N200 Calculus of kidney: Secondary | ICD-10-CM

## 2018-06-24 DIAGNOSIS — Z95828 Presence of other vascular implants and grafts: Secondary | ICD-10-CM

## 2018-06-24 DIAGNOSIS — Z510 Encounter for antineoplastic radiation therapy: Secondary | ICD-10-CM | POA: Diagnosis not present

## 2018-06-24 LAB — CBC WITH DIFFERENTIAL (CANCER CENTER ONLY)
Abs Immature Granulocytes: 0.04 10*3/uL (ref 0.00–0.07)
Basophils Absolute: 0 10*3/uL (ref 0.0–0.1)
Basophils Relative: 0 %
Eosinophils Absolute: 0.3 10*3/uL (ref 0.0–0.5)
Eosinophils Relative: 4 %
HCT: 36.6 % — ABNORMAL LOW (ref 39.0–52.0)
Hemoglobin: 12 g/dL — ABNORMAL LOW (ref 13.0–17.0)
Immature Granulocytes: 1 %
Lymphocytes Relative: 11 %
Lymphs Abs: 0.9 10*3/uL (ref 0.7–4.0)
MCH: 33 pg (ref 26.0–34.0)
MCHC: 32.8 g/dL (ref 30.0–36.0)
MCV: 100.5 fL — ABNORMAL HIGH (ref 80.0–100.0)
Monocytes Absolute: 0.5 10*3/uL (ref 0.1–1.0)
Monocytes Relative: 6 %
Neutro Abs: 6 10*3/uL (ref 1.7–7.7)
Neutrophils Relative %: 78 %
Platelet Count: 135 10*3/uL — ABNORMAL LOW (ref 150–400)
RBC: 3.64 MIL/uL — ABNORMAL LOW (ref 4.22–5.81)
RDW: 15.2 % (ref 11.5–15.5)
WBC Count: 7.7 10*3/uL (ref 4.0–10.5)
nRBC: 0 % (ref 0.0–0.2)

## 2018-06-24 MED ORDER — HEPARIN SOD (PORK) LOCK FLUSH 100 UNIT/ML IV SOLN
500.0000 [IU] | Freq: Once | INTRAVENOUS | Status: AC
  Administered 2018-06-24: 500 [IU]
  Filled 2018-06-24: qty 5

## 2018-06-24 MED ORDER — SODIUM CHLORIDE 0.9% FLUSH
10.0000 mL | Freq: Once | INTRAVENOUS | Status: AC
  Administered 2018-06-24: 10 mL
  Filled 2018-06-24: qty 10

## 2018-06-24 NOTE — Progress Notes (Signed)
[place] OFFICE PROGRESS NOTE   Diagnosis: Rectal cancer, non-small cell lung cancer  INTERVAL HISTORY:   Austin Yu returns as scheduled.  He began radiation/Xeloda 06/10/2018.  He denies nausea/vomiting.  No mouth sores.  No diarrhea.  No hand or foot pain or redness.  He denies bleeding.  No right neck pain or swelling.  Objective:  Vital signs in last 24 hours:  Blood pressure 129/77, pulse 78, temperature 98.2 F (36.8 C), temperature source Oral, resp. rate 18, height _0  (1.702 m), weight 179 lb 3.2 oz (81.3 kg), SpO2 94 %.    HEENT: No thrush or ulcers. GI: Abdomen soft and nontender.  No hepatomegaly. Vascular: No leg edema. Skin: Palms without erythema. Port-A-Cath without erythema.   Lab Results:  Lab Results  Component Value Date   WBC 7.7 06/24/2018   HGB 12.0 (L) 06/24/2018   HCT 36.6 (L) 06/24/2018   MCV 100.5 (H) 06/24/2018   PLT 135 (L) 06/24/2018   NEUTROABS 6.0 06/24/2018    Imaging:  No results found.  Medications: I have reviewed the patient's current medications.  Assessment/Plan: 1. Rectal cancer ? Distal rectal mass noted on colonoscopy 01/21/2018, biopsy confirmed invasive adenocarcinoma, cytokeratin 20 (patchy) and CDX 2+, cytokeratin 7 and TTF-1 negative. PIK3CA, APC, TP53 detected. ? CTs 01/29/2018-new left infrahilar mass, enlarged left hilar lymph node, mild subcarinal lymphadenopathy, confluent ill-defined lesion in area fibrosis at the posterior left lower lobe measuring 2 x 1.5 cm, rectal mass, perirectal lymph nodes ? MRI 01/28/2018-[id number] located at 6.6 cm from the internal anal sphincter, 2 mesorectal lymph nodes ? Hypermetabolic left infrahilar mass. Hypermetabolic nodule left lower lobe. Focal hypermetabolic activity within the rectum. ? Bronchoscopy 02/07/2018-no endobronchial lesions or abnormal secretions seen. Transbronchial brushings obtained from the left lower lobe nodule. Significant enlargement  of the station 7, station 10 L, station 11 L nodes. Needle biopsies were taken from station 7 and 11 L nodes. ? Pathology from the left lower lobe brushings, a level 7 lymph node returned positive for adenocarcinoma, positive for cytokeratin 7 and CDX2, negative for cytokeratin 20 and TTF-1.  Fine-needle aspiration EBUS, 7, B with malignant cells consistent with adenocarcinoma; CCND1 (amp) detected. ? Cycle 1 FOLFOX 02/21/2018 ? Cycle 2 FOLFOX 03/13/2018,Udenyca added ? Cycle 3 FOLFOX 03/28/2018 ? Cycle 4 FOLFOX 04/18/2018 (oxaliplatin held) ? CT chest 04/19/2018- left infrahilar node previously measured 2.6 x 3.4, current 1.8 x 2.0.  11 mm paraesophageal lymph node, previously 14 mm. ? Cycle 5 FOLFOX 05/02/2018 ? Cycle 6 FOLFOX 05/16/2018 ? CT chest, abdomen, and pelvis on 05/28/2018- stable left lower lobe nodule, decreased left infrahilar mediastinal adenopathy, suspicion of right jugular vein thrombus, decreased perirectal lymph node, stable rectal wall thickening, new right ureteropelvic and distal right ureter stones with hydroureteronephrosis ? Radiation/Xeloda 06/10/2018 2. Pulmonary fibrosis 3. Kidney stones-new stones with right hydroureteronephrosis on CT 05/27/2018 4. BPH 5. History of gastroesophageal reflux 6. Mild renal insufficiency 7. Port-A-Cath placement 02/14/2018 8. Neutropenia and thrombocytopenia secondary to chemotherapy, Udenyca added with cycle 2 and oxaliplatin dose reduced 9. Tachycardia-chest CT 04/19/2018 negative for pulmonary embolus; underlying fibrotic lung disease again noted; slight increase patchy groundglass attenuation right upper lobe.  Echocardiogram 04/23/2018- calculated EF 64%.  Tachycardia improved 05/02/2018 10. Cystoscopy/right ureteroscopic stone extraction 06/10/2018 11. 06/04/2018 right upper extremity venous Doppler- acute deep vein thrombosis involving the right internal jugular veins.  Eliquis initiated   Disposition: Austin Yu appears stable.  He continues  radiation/Xeloda.  Overall he seems to  be tolerating the Xeloda well.   We reviewed the CBC from today.  He has mild thrombocytopenia.  Plan to repeat CBC in 1 week.  He understands to contact the office with bleeding.  He will return for lab and follow-up on 07/17/2018.  He will contact the office in the interim as outlined above or with any problems.  Plan reviewed with Dr. Benay Spice.    [name] ANP/GNP-BC   06/24/2018  11:12 AM

## 2018-06-25 ENCOUNTER — Ambulatory Visit
Admission: RE | Admit: 2018-06-25 | Discharge: 2018-06-25 | Disposition: A | Discharge status: Home / Self Care | Payer: PPO | Source: Ambulatory Visit | Attending: Radiation Oncology | Admitting: Radiation Oncology

## 2018-06-25 ENCOUNTER — Other Ambulatory Visit: Payer: Self-pay

## 2018-06-25 DIAGNOSIS — C20 Malignant neoplasm of rectum: Secondary | ICD-10-CM | POA: Diagnosis not present

## 2018-06-25 DIAGNOSIS — Z510 Encounter for antineoplastic radiation therapy: Secondary | ICD-10-CM | POA: Diagnosis not present

## 2018-06-26 ENCOUNTER — Ambulatory Visit
Admission: RE | Admit: 2018-06-26 | Discharge: 2018-06-26 | Disposition: A | Discharge status: Home / Self Care | Payer: PPO | Source: Ambulatory Visit | Attending: Radiation Oncology | Admitting: Radiation Oncology

## 2018-06-26 ENCOUNTER — Other Ambulatory Visit: Payer: Self-pay

## 2018-06-26 ENCOUNTER — Telehealth: Payer: Self-pay | Admitting: Oncology

## 2018-06-26 DIAGNOSIS — Z510 Encounter for antineoplastic radiation therapy: Secondary | ICD-10-CM | POA: Diagnosis not present

## 2018-06-26 DIAGNOSIS — C3490 Malignant neoplasm of unspecified part of unspecified bronchus or lung: Secondary | ICD-10-CM | POA: Diagnosis not present

## 2018-06-26 DIAGNOSIS — C20 Malignant neoplasm of rectum: Secondary | ICD-10-CM | POA: Diagnosis not present

## 2018-06-26 NOTE — Telephone Encounter (Signed)
Scheduled per los. Called and left msg

## 2018-06-27 ENCOUNTER — Other Ambulatory Visit: Payer: Self-pay

## 2018-06-27 ENCOUNTER — Ambulatory Visit
Admission: RE | Admit: 2018-06-27 | Discharge: 2018-06-27 | Disposition: A | Discharge status: Home / Self Care | Payer: PPO | Source: Ambulatory Visit | Attending: Radiation Oncology | Admitting: Radiation Oncology

## 2018-06-27 DIAGNOSIS — C20 Malignant neoplasm of rectum: Secondary | ICD-10-CM | POA: Diagnosis not present

## 2018-06-27 DIAGNOSIS — Z510 Encounter for antineoplastic radiation therapy: Secondary | ICD-10-CM | POA: Diagnosis not present

## 2018-06-28 ENCOUNTER — Other Ambulatory Visit: Payer: Self-pay

## 2018-06-28 ENCOUNTER — Ambulatory Visit
Admission: RE | Admit: 2018-06-28 | Discharge: 2018-06-28 | Disposition: A | Discharge status: Home / Self Care | Payer: PPO | Source: Ambulatory Visit | Attending: Radiation Oncology | Admitting: Radiation Oncology

## 2018-06-28 DIAGNOSIS — Z510 Encounter for antineoplastic radiation therapy: Secondary | ICD-10-CM | POA: Diagnosis not present

## 2018-06-28 DIAGNOSIS — C20 Malignant neoplasm of rectum: Secondary | ICD-10-CM | POA: Diagnosis not present

## 2018-07-01 ENCOUNTER — Inpatient Hospital Stay: Payer: PPO

## 2018-07-01 ENCOUNTER — Ambulatory Visit
Admission: RE | Admit: 2018-07-01 | Discharge: 2018-07-01 | Disposition: A | Discharge status: Home / Self Care | Payer: PPO | Source: Ambulatory Visit | Attending: Radiation Oncology | Admitting: Radiation Oncology

## 2018-07-01 ENCOUNTER — Other Ambulatory Visit: Payer: Self-pay

## 2018-07-01 DIAGNOSIS — Z510 Encounter for antineoplastic radiation therapy: Secondary | ICD-10-CM | POA: Diagnosis not present

## 2018-07-01 DIAGNOSIS — C20 Malignant neoplasm of rectum: Secondary | ICD-10-CM

## 2018-07-01 DIAGNOSIS — Z95828 Presence of other vascular implants and grafts: Secondary | ICD-10-CM

## 2018-07-01 LAB — CBC WITH DIFFERENTIAL (CANCER CENTER ONLY)
Abs Immature Granulocytes: 0.03 10*3/uL (ref 0.00–0.07)
Basophils Absolute: 0 10*3/uL (ref 0.0–0.1)
Basophils Relative: 1 %
Eosinophils Absolute: 0.5 10*3/uL (ref 0.0–0.5)
Eosinophils Relative: 8 %
HCT: 33.8 % — ABNORMAL LOW (ref 39.0–52.0)
Hemoglobin: 11.3 g/dL — ABNORMAL LOW (ref 13.0–17.0)
Immature Granulocytes: 1 %
Lymphocytes Relative: 17 %
Lymphs Abs: 1 10*3/uL (ref 0.7–4.0)
MCH: 34.2 pg — ABNORMAL HIGH (ref 26.0–34.0)
MCHC: 33.4 g/dL (ref 30.0–36.0)
MCV: 102.4 fL — ABNORMAL HIGH (ref 80.0–100.0)
Monocytes Absolute: 0.4 10*3/uL (ref 0.1–1.0)
Monocytes Relative: 7 %
Neutro Abs: 3.7 10*3/uL (ref 1.7–7.7)
Neutrophils Relative %: 66 %
Platelet Count: 122 10*3/uL — ABNORMAL LOW (ref 150–400)
RBC: 3.3 MIL/uL — ABNORMAL LOW (ref 4.22–5.81)
RDW: 16.7 % — ABNORMAL HIGH (ref 11.5–15.5)
WBC Count: 5.5 10*3/uL (ref 4.0–10.5)
nRBC: 0 % (ref 0.0–0.2)

## 2018-07-01 MED ORDER — HEPARIN SOD (PORK) LOCK FLUSH 100 UNIT/ML IV SOLN
500.0000 [IU] | Freq: Once | INTRAVENOUS | Status: AC
  Administered 2018-07-01: 500 [IU]
  Filled 2018-07-01: qty 5

## 2018-07-01 MED ORDER — SODIUM CHLORIDE 0.9% FLUSH
10.0000 mL | Freq: Once | INTRAVENOUS | Status: AC
  Administered 2018-07-01: 10 mL
  Filled 2018-07-01: qty 10

## 2018-07-02 ENCOUNTER — Other Ambulatory Visit: Payer: Self-pay

## 2018-07-02 ENCOUNTER — Ambulatory Visit
Admission: RE | Admit: 2018-07-02 | Discharge: 2018-07-02 | Disposition: A | Discharge status: Home / Self Care | Payer: PPO | Source: Ambulatory Visit | Attending: Radiation Oncology | Admitting: Radiation Oncology

## 2018-07-02 DIAGNOSIS — C20 Malignant neoplasm of rectum: Secondary | ICD-10-CM | POA: Diagnosis not present

## 2018-07-02 DIAGNOSIS — Z510 Encounter for antineoplastic radiation therapy: Secondary | ICD-10-CM | POA: Diagnosis not present

## 2018-07-03 ENCOUNTER — Other Ambulatory Visit: Payer: Self-pay

## 2018-07-03 ENCOUNTER — Ambulatory Visit
Admission: RE | Admit: 2018-07-03 | Discharge: 2018-07-03 | Disposition: A | Discharge status: Home / Self Care | Payer: PPO | Source: Ambulatory Visit | Attending: Radiation Oncology | Admitting: Radiation Oncology

## 2018-07-03 ENCOUNTER — Telehealth: Payer: Self-pay | Admitting: *Deleted

## 2018-07-03 DIAGNOSIS — C20 Malignant neoplasm of rectum: Secondary | ICD-10-CM | POA: Diagnosis not present

## 2018-07-03 DIAGNOSIS — Z510 Encounter for antineoplastic radiation therapy: Secondary | ICD-10-CM | POA: Diagnosis not present

## 2018-07-03 NOTE — Telephone Encounter (Signed)
Medical records faxed to [facility] - Release [phone]

## 2018-07-04 ENCOUNTER — Ambulatory Visit
Admission: RE | Admit: 2018-07-04 | Discharge: 2018-07-04 | Disposition: A | Discharge status: Home / Self Care | Payer: PPO | Source: Ambulatory Visit | Attending: Radiation Oncology | Admitting: Radiation Oncology

## 2018-07-04 ENCOUNTER — Other Ambulatory Visit: Payer: Self-pay

## 2018-07-04 DIAGNOSIS — Z510 Encounter for antineoplastic radiation therapy: Secondary | ICD-10-CM | POA: Diagnosis not present

## 2018-07-04 DIAGNOSIS — C20 Malignant neoplasm of rectum: Secondary | ICD-10-CM | POA: Diagnosis not present

## 2018-07-05 ENCOUNTER — Telehealth: Payer: Self-pay | Admitting: *Deleted

## 2018-07-05 ENCOUNTER — Ambulatory Visit
Admission: RE | Admit: 2018-07-05 | Discharge: 2018-07-05 | Disposition: A | Discharge status: Home / Self Care | Payer: PPO | Source: Ambulatory Visit | Attending: Radiation Oncology | Admitting: Radiation Oncology

## 2018-07-05 ENCOUNTER — Other Ambulatory Visit: Payer: Self-pay

## 2018-07-05 DIAGNOSIS — C20 Malignant neoplasm of rectum: Secondary | ICD-10-CM | POA: Diagnosis not present

## 2018-07-05 DIAGNOSIS — Z510 Encounter for antineoplastic radiation therapy: Secondary | ICD-10-CM | POA: Diagnosis not present

## 2018-07-05 NOTE — Telephone Encounter (Signed)
Faxed mr to [place]; release [phone]

## 2018-07-08 ENCOUNTER — Ambulatory Visit
Admission: RE | Admit: 2018-07-08 | Discharge: 2018-07-08 | Disposition: A | Discharge status: Home / Self Care | Payer: PPO | Source: Ambulatory Visit | Attending: Radiation Oncology | Admitting: Radiation Oncology

## 2018-07-08 ENCOUNTER — Other Ambulatory Visit: Payer: Self-pay

## 2018-07-08 DIAGNOSIS — C20 Malignant neoplasm of rectum: Secondary | ICD-10-CM | POA: Diagnosis not present

## 2018-07-08 DIAGNOSIS — Z510 Encounter for antineoplastic radiation therapy: Secondary | ICD-10-CM | POA: Diagnosis not present

## 2018-07-09 ENCOUNTER — Other Ambulatory Visit: Payer: Self-pay

## 2018-07-09 ENCOUNTER — Ambulatory Visit
Admission: RE | Admit: 2018-07-09 | Discharge: 2018-07-09 | Disposition: A | Discharge status: Home / Self Care | Payer: PPO | Source: Ambulatory Visit | Attending: Radiation Oncology | Admitting: Radiation Oncology

## 2018-07-09 DIAGNOSIS — C20 Malignant neoplasm of rectum: Secondary | ICD-10-CM | POA: Diagnosis not present

## 2018-07-09 DIAGNOSIS — Z510 Encounter for antineoplastic radiation therapy: Secondary | ICD-10-CM | POA: Diagnosis not present

## 2018-07-10 ENCOUNTER — Ambulatory Visit
Admission: RE | Admit: 2018-07-10 | Discharge: 2018-07-10 | Disposition: A | Discharge status: Home / Self Care | Payer: PPO | Source: Ambulatory Visit | Attending: Radiation Oncology | Admitting: Radiation Oncology

## 2018-07-10 ENCOUNTER — Other Ambulatory Visit: Payer: Self-pay

## 2018-07-10 DIAGNOSIS — C20 Malignant neoplasm of rectum: Secondary | ICD-10-CM | POA: Insufficient documentation

## 2018-07-10 DIAGNOSIS — K5730 Diverticulosis of large intestine without perforation or abscess without bleeding: Secondary | ICD-10-CM | POA: Diagnosis not present

## 2018-07-10 DIAGNOSIS — R590 Localized enlarged lymph nodes: Secondary | ICD-10-CM | POA: Diagnosis not present

## 2018-07-10 DIAGNOSIS — Z86718 Personal history of other venous thrombosis and embolism: Secondary | ICD-10-CM | POA: Diagnosis not present

## 2018-07-10 DIAGNOSIS — Z510 Encounter for antineoplastic radiation therapy: Secondary | ICD-10-CM | POA: Diagnosis not present

## 2018-07-10 DIAGNOSIS — J8410 Pulmonary fibrosis, unspecified: Secondary | ICD-10-CM | POA: Diagnosis not present

## 2018-07-10 DIAGNOSIS — C7802 Secondary malignant neoplasm of left lung: Secondary | ICD-10-CM | POA: Diagnosis not present

## 2018-07-10 DIAGNOSIS — Z79899 Other long term (current) drug therapy: Secondary | ICD-10-CM | POA: Diagnosis not present

## 2018-07-10 DIAGNOSIS — Z7901 Long term (current) use of anticoagulants: Secondary | ICD-10-CM | POA: Diagnosis not present

## 2018-07-11 ENCOUNTER — Other Ambulatory Visit: Payer: Self-pay

## 2018-07-11 ENCOUNTER — Ambulatory Visit
Admission: RE | Admit: 2018-07-11 | Discharge: 2018-07-11 | Disposition: A | Discharge status: Home / Self Care | Payer: PPO | Source: Ambulatory Visit | Attending: Radiation Oncology | Admitting: Radiation Oncology

## 2018-07-11 DIAGNOSIS — Z510 Encounter for antineoplastic radiation therapy: Secondary | ICD-10-CM | POA: Diagnosis not present

## 2018-07-11 DIAGNOSIS — C20 Malignant neoplasm of rectum: Secondary | ICD-10-CM | POA: Diagnosis not present

## 2018-07-15 ENCOUNTER — Ambulatory Visit
Admission: RE | Admit: 2018-07-15 | Discharge: 2018-07-15 | Disposition: A | Discharge status: Home / Self Care | Payer: PPO | Source: Ambulatory Visit | Attending: Radiation Oncology | Admitting: Radiation Oncology

## 2018-07-15 ENCOUNTER — Other Ambulatory Visit: Payer: Self-pay

## 2018-07-15 DIAGNOSIS — C20 Malignant neoplasm of rectum: Secondary | ICD-10-CM | POA: Diagnosis not present

## 2018-07-15 DIAGNOSIS — Z510 Encounter for antineoplastic radiation therapy: Secondary | ICD-10-CM | POA: Diagnosis not present

## 2018-07-16 ENCOUNTER — Ambulatory Visit
Admission: RE | Admit: 2018-07-16 | Discharge: 2018-07-16 | Disposition: A | Discharge status: Home / Self Care | Payer: PPO | Source: Ambulatory Visit | Attending: Radiation Oncology | Admitting: Radiation Oncology

## 2018-07-16 ENCOUNTER — Other Ambulatory Visit: Payer: Self-pay

## 2018-07-16 DIAGNOSIS — C20 Malignant neoplasm of rectum: Secondary | ICD-10-CM | POA: Diagnosis not present

## 2018-07-16 DIAGNOSIS — Z510 Encounter for antineoplastic radiation therapy: Secondary | ICD-10-CM | POA: Diagnosis not present

## 2018-07-16 DIAGNOSIS — C771 Secondary and unspecified malignant neoplasm of intrathoracic lymph nodes: Secondary | ICD-10-CM | POA: Diagnosis not present

## 2018-07-16 DIAGNOSIS — C7802 Secondary malignant neoplasm of left lung: Secondary | ICD-10-CM | POA: Diagnosis not present

## 2018-07-17 ENCOUNTER — Inpatient Hospital Stay: Payer: PPO

## 2018-07-17 ENCOUNTER — Inpatient Hospital Stay: Payer: PPO | Attending: Oncology | Admitting: Oncology

## 2018-07-17 ENCOUNTER — Ambulatory Visit
Admission: RE | Admit: 2018-07-17 | Discharge: 2018-07-17 | Disposition: A | Discharge status: Home / Self Care | Payer: PPO | Source: Ambulatory Visit | Attending: Radiation Oncology | Admitting: Radiation Oncology

## 2018-07-17 ENCOUNTER — Other Ambulatory Visit: Payer: Self-pay

## 2018-07-17 ENCOUNTER — Telehealth: Payer: Self-pay | Admitting: Oncology

## 2018-07-17 VITALS — BP 104/65 | HR 99 | Temp 98.2°F | Resp 18 | Ht 67.0 in | Wt 177.2 lb

## 2018-07-17 DIAGNOSIS — N289 Disorder of kidney and ureter, unspecified: Secondary | ICD-10-CM

## 2018-07-17 DIAGNOSIS — Z95828 Presence of other vascular implants and grafts: Secondary | ICD-10-CM

## 2018-07-17 DIAGNOSIS — J8410 Pulmonary fibrosis, unspecified: Secondary | ICD-10-CM | POA: Diagnosis not present

## 2018-07-17 DIAGNOSIS — Z7901 Long term (current) use of anticoagulants: Secondary | ICD-10-CM | POA: Diagnosis not present

## 2018-07-17 DIAGNOSIS — C3432 Malignant neoplasm of lower lobe, left bronchus or lung: Secondary | ICD-10-CM | POA: Insufficient documentation

## 2018-07-17 DIAGNOSIS — I82C11 Acute embolism and thrombosis of right internal jugular vein: Secondary | ICD-10-CM | POA: Diagnosis not present

## 2018-07-17 DIAGNOSIS — Z5111 Encounter for antineoplastic chemotherapy: Secondary | ICD-10-CM | POA: Insufficient documentation

## 2018-07-17 DIAGNOSIS — Z510 Encounter for antineoplastic radiation therapy: Secondary | ICD-10-CM | POA: Diagnosis not present

## 2018-07-17 DIAGNOSIS — C20 Malignant neoplasm of rectum: Secondary | ICD-10-CM

## 2018-07-17 DIAGNOSIS — N1330 Unspecified hydronephrosis: Secondary | ICD-10-CM | POA: Diagnosis not present

## 2018-07-17 DIAGNOSIS — R350 Frequency of micturition: Secondary | ICD-10-CM | POA: Diagnosis not present

## 2018-07-17 LAB — CBC WITH DIFFERENTIAL (CANCER CENTER ONLY)
Abs Immature Granulocytes: 0.01 10*3/uL (ref 0.00–0.07)
Basophils Absolute: 0 10*3/uL (ref 0.0–0.1)
Basophils Relative: 1 %
Eosinophils Absolute: 0.1 10*3/uL (ref 0.0–0.5)
Eosinophils Relative: 3 %
HCT: 34.8 % — ABNORMAL LOW (ref 39.0–52.0)
Hemoglobin: 11.4 g/dL — ABNORMAL LOW (ref 13.0–17.0)
Immature Granulocytes: 0 %
Lymphocytes Relative: 14 %
Lymphs Abs: 0.6 10*3/uL — ABNORMAL LOW (ref 0.7–4.0)
MCH: 34.7 pg — ABNORMAL HIGH (ref 26.0–34.0)
MCHC: 32.8 g/dL (ref 30.0–36.0)
MCV: 105.8 fL — ABNORMAL HIGH (ref 80.0–100.0)
Monocytes Absolute: 0.5 10*3/uL (ref 0.1–1.0)
Monocytes Relative: 12 %
Neutro Abs: 3 10*3/uL (ref 1.7–7.7)
Neutrophils Relative %: 70 %
Platelet Count: 141 10*3/uL — ABNORMAL LOW (ref 150–400)
RBC: 3.29 MIL/uL — ABNORMAL LOW (ref 4.22–5.81)
RDW: 19.4 % — ABNORMAL HIGH (ref 11.5–15.5)
WBC Count: 4.3 10*3/uL (ref 4.0–10.5)
nRBC: 0 % (ref 0.0–0.2)

## 2018-07-17 LAB — CMP (CANCER CENTER ONLY)
ALT: 13 U/L (ref 0–44)
AST: 22 U/L (ref 15–41)
Albumin: 3 g/dL — ABNORMAL LOW (ref 3.5–5.0)
Alkaline Phosphatase: 61 U/L (ref 38–126)
Anion gap: 11 (ref 5–15)
BUN: 16 mg/dL (ref 8–23)
CO2: 23 mmol/L (ref 22–32)
Calcium: 8.8 mg/dL — ABNORMAL LOW (ref 8.9–10.3)
Chloride: 107 mmol/L (ref 98–111)
Creatinine: 1.37 mg/dL — ABNORMAL HIGH (ref 0.61–1.24)
GFR, Est AFR Am: 60 mL/min — ABNORMAL LOW (ref >60)
GFR, Estimated: 52 mL/min — ABNORMAL LOW (ref >60)
Glucose, Bld: 118 mg/dL — ABNORMAL HIGH (ref 70–99)
Potassium: 3.9 mmol/L (ref 3.5–5.1)
Sodium: 141 mmol/L (ref 135–145)
Total Bilirubin: 0.6 mg/dL (ref 0.3–1.2)
Total Protein: 6.7 g/dL (ref 6.5–8.1)

## 2018-07-17 LAB — CEA (IN HOUSE-CHCC): CEA (CHCC-In House): 17.61 ng/mL — ABNORMAL HIGH (ref 0.00–5.00)

## 2018-07-17 MED ORDER — SODIUM CHLORIDE 0.9% FLUSH
10.0000 mL | Freq: Once | INTRAVENOUS | Status: AC
  Administered 2018-07-17: 10 mL
  Filled 2018-07-17: qty 10

## 2018-07-17 MED ORDER — HEPARIN SOD (PORK) LOCK FLUSH 100 UNIT/ML IV SOLN
500.0000 [IU] | Freq: Once | INTRAVENOUS | Status: AC
  Administered 2018-07-17: 09:00:00 500 [IU]
  Filled 2018-07-17: qty 5

## 2018-07-17 NOTE — Progress Notes (Signed)
[place] OFFICE PROGRESS NOTE   Diagnosis: Rectal cancer  INTERVAL HISTORY:   [name] returns as scheduled.  Continues Xeloda and radiation.  He has been evaluated by Dr. Dema Severin to consider rectal surgery.  No mouth sores, nausea, diarrhea, or hand/foot pain.  Stable exertional dyspnea. He complains of urinary frequency.  He relates this to the ureter stent. Objective:  Vital signs in last 24 hours:  Blood pressure 104/65, pulse 99, temperature 98.2 F (36.8 C), temperature source Oral, resp. rate 18, height '5\' 7"'  (1.702 m), weight 177 lb 3.2 oz (80.4 kg), SpO2 94 %.    HEENT: No thrush or ulcers GI: No hepatomegaly, nontender Vascular: No leg edema Skin: Palms and soles without erythema, mild dryness of the soles  Portacath/PICC-without erythema  Lab Results:  Lab Results  Component Value Date   WBC 4.3 07/17/2018   HGB 11.4 (L) 07/17/2018   HCT 34.8 (L) 07/17/2018   MCV 105.8 (H) 07/17/2018   PLT 141 (L) 07/17/2018   NEUTROABS 3.0 07/17/2018    CMP  Lab Results  Component Value Date   NA 141 07/17/2018   K 3.9 07/17/2018   CL 107 07/17/2018   CO2 23 07/17/2018   GLUCOSE 118 (H) 07/17/2018   BUN 16 07/17/2018   CREATININE 1.37 (H) 07/17/2018   CALCIUM 8.8 (L) 07/17/2018   PROT 6.7 07/17/2018   ALBUMIN 3.0 (L) 07/17/2018   AST 22 07/17/2018   ALT 13 07/17/2018   ALKPHOS 61 07/17/2018   BILITOT 0.6 07/17/2018   GFRNONAA 52 (L) 07/17/2018   GFRAA 60 (L) 07/17/2018    Lab Results  Component Value Date   CEA1 29.28 (H) 05/29/2018     Medications: I have reviewed the patient's current medications.   Assessment/Plan: 1. Rectal cancer ? Distal rectal mass noted on colonoscopy 01/21/2018, biopsy confirmed invasive adenocarcinoma, cytokeratin 20 (patchy) and CDX 2+, cytokeratin 7 and TTF-1 negative. PIK3CA, APC, TP53 detected. ? CTs 01/29/2018-new left infrahilar mass, enlarged left hilar lymph node, mild subcarinal lymphadenopathy,  confluent ill-defined lesion in area fibrosis at the posterior left lower lobe measuring 2 x 1.5 cm, rectal mass, perirectal lymph nodes ? MRI 01/28/2018-[id number] located at 6.6 cm from the internal anal sphincter, 2 mesorectal lymph nodes ? Hypermetabolic left infrahilar mass. Hypermetabolic nodule left lower lobe. Focal hypermetabolic activity within the rectum. ? Bronchoscopy 02/07/2018-no endobronchial lesions or abnormal secretions seen. Transbronchial brushings obtained from the left lower lobe nodule. Significant enlargement of the station 7, station 10 L, station 11 L nodes. Needle biopsies were taken from station 7 and 11 L nodes. ? Pathology from the left lower lobe brushings, a level 7 lymph node returned positive for adenocarcinoma, positive for cytokeratin 7 and CDX2, negative for cytokeratin 20 and TTF-1.  Fine-needle aspiration EBUS, 7, B with malignant cells consistent with adenocarcinoma; CCND1 (amp) detected. ? Cycle 1 FOLFOX 02/21/2018 ? Cycle 2 FOLFOX 03/13/2018,Udenyca added ? Cycle 3 FOLFOX 03/28/2018 ? Cycle 4 FOLFOX 04/18/2018 (oxaliplatin held) ? CT chest 04/19/2018- left infrahilar node previously measured 2.6 x 3.4, current 1.8 x 2.0.  11 mm paraesophageal lymph node, previously 14 mm. ? Cycle 5 FOLFOX 05/02/2018 ? Cycle 6 FOLFOX 05/16/2018 ? CT chest, abdomen, and pelvis on 05/28/2018- stable left lower lobe nodule, decreased left infrahilar mediastinal adenopathy, suspicion of right jugular vein thrombus, decreased perirectal lymph node, stable rectal wall thickening, new right ureteropelvic and distal right ureter stones with hydroureteronephrosis ? Radiation/Xeloda 06/10/2018- 07/18/2018 2. Pulmonary fibrosis 3. Kidney  stones-new stones with right hydroureteronephrosis on CT 05/27/2018 4. BPH 5. History of gastroesophageal reflux 6. Mild renal insufficiency 7. Port-A-Cath placement 02/14/2018 8. Neutropenia and thrombocytopenia secondary to chemotherapy, Udenyca added with  cycle 2 and oxaliplatin dose reduced 9. Tachycardia-chest CT 04/19/2018 negative for pulmonary embolus; underlying fibrotic lung disease again noted; slight increase patchy groundglass attenuation right upper lobe.  Echocardiogram 04/23/2018- calculated EF 64%.  Tachycardia improved 05/02/2018 10. Cystoscopy/right ureteroscopic stone extraction 06/10/2018 11. 06/04/2018 right upper extremity venous Doppler- acute deep vein thrombosis involving the right internal jugular veins.  Eliquis initiated 12. Non-small cell lung cancer- hypermetabolic left infrahilar mass, and left lower lobe nodule  Pathology from the left lower lobe brushings, a level 7 lymph node returned positive for adenocarcinoma, positive for cytokeratin 7 and CDX2, negative for cytokeratin 20 and TTF-1.  Fine-needle aspiration EBUS, 7, B with malignant cells consistent with adenocarcinoma; CCND1 (amp) detected.    Disposition: [name] appears stable.  He has tolerated the Xeloda and radiation well.  He will complete treatment tomorrow.  The current plan is to proceed with resection of the rectal primary in 8-10 weeks.  He saw Dr. Dema Severin and has been referred for a second surgical opinion at [facility], scheduled for 07/31/2018.  [name] has synchronous non-small cell lung cancer.  He does not appear to be a surgical candidate based on the diagnosis of pulmonary fibrosis.  The plan is for concurrent weekly Taxol/carboplatin and chest radiation.  I reviewed potential toxicities associated with the Taxol/carboplatin regimen including the chance for alopecia, and allergic reaction, hematologic toxicity, and neuropathy.  He agrees to proceed.  The tentative plan is to begin concurrent chemotherapy and radiation on 08/05/2018.  I will coordinate the treatment plan with Dr. Lisbeth Renshaw.  Mr. Galli will return for an office visit on 08/01/2018.  Betsy Coder, MD  07/17/2018  9:43 AM

## 2018-07-17 NOTE — Telephone Encounter (Signed)
Gave avs and calendar ° °

## 2018-07-18 ENCOUNTER — Encounter: Payer: Self-pay | Admitting: Radiation Oncology

## 2018-07-18 ENCOUNTER — Other Ambulatory Visit: Payer: Self-pay | Admitting: Urology

## 2018-07-18 ENCOUNTER — Other Ambulatory Visit: Payer: Self-pay

## 2018-07-18 ENCOUNTER — Telehealth: Payer: Self-pay | Admitting: Radiation Oncology

## 2018-07-18 ENCOUNTER — Other Ambulatory Visit: Payer: Self-pay | Admitting: Radiation Oncology

## 2018-07-18 ENCOUNTER — Ambulatory Visit
Admission: RE | Admit: 2018-07-18 | Discharge: 2018-07-18 | Disposition: A | Discharge status: Home / Self Care | Payer: PPO | Source: Ambulatory Visit | Attending: Radiation Oncology | Admitting: Radiation Oncology

## 2018-07-18 ENCOUNTER — Telehealth: Payer: Self-pay | Admitting: *Deleted

## 2018-07-18 DIAGNOSIS — C20 Malignant neoplasm of rectum: Secondary | ICD-10-CM | POA: Diagnosis not present

## 2018-07-18 DIAGNOSIS — Z510 Encounter for antineoplastic radiation therapy: Secondary | ICD-10-CM | POA: Diagnosis not present

## 2018-07-18 NOTE — Telephone Encounter (Signed)
Thanks

## 2018-07-18 NOTE — Telephone Encounter (Signed)
I spoke with the patient to let him know we needed to proceed with restaging CT of the chest. Hopefully we can get that this weekend or early next week. I gave him a tentative simulation appt for next Wednesday with the anticipation of starting chemoRT with Dr. Benay Spice on 08/05/2018 to the chest. He finished his last XRT to the pelvis for his rectal primary today.

## 2018-07-18 NOTE — Telephone Encounter (Signed)
Called patient to inform of CT for 07-23-18 - arrival time - 4:15 pm @ [facility], pt. to have water only - 4 hrs. prior to test, spoke with patient and he is aware of this test

## 2018-07-23 ENCOUNTER — Ambulatory Visit (HOSPITAL_COMMUNITY)
Admission: RE | Admit: 2018-07-23 | Discharge: 2018-07-23 | Disposition: A | Discharge status: Home / Self Care | Payer: PPO | Source: Ambulatory Visit | Attending: Radiation Oncology | Admitting: Radiation Oncology

## 2018-07-23 ENCOUNTER — Other Ambulatory Visit: Payer: Self-pay

## 2018-07-23 DIAGNOSIS — C20 Malignant neoplasm of rectum: Secondary | ICD-10-CM

## 2018-07-23 DIAGNOSIS — R918 Other nonspecific abnormal finding of lung field: Secondary | ICD-10-CM | POA: Diagnosis not present

## 2018-07-23 MED ORDER — IOHEXOL 300 MG/ML  SOLN
75.0000 mL | Freq: Once | INTRAMUSCULAR | Status: AC | PRN
  Administered 2018-07-23: 75 mL via INTRAVENOUS

## 2018-07-23 MED ORDER — SODIUM CHLORIDE (PF) 0.9 % IJ SOLN
INTRAMUSCULAR | Status: AC
  Filled 2018-07-23: qty 50

## 2018-07-24 ENCOUNTER — Other Ambulatory Visit: Payer: Self-pay

## 2018-07-24 ENCOUNTER — Ambulatory Visit
Admission: RE | Admit: 2018-07-24 | Discharge: 2018-07-24 | Disposition: A | Discharge status: Home / Self Care | Payer: PPO | Source: Ambulatory Visit | Attending: Radiation Oncology | Admitting: Radiation Oncology

## 2018-07-24 DIAGNOSIS — Z510 Encounter for antineoplastic radiation therapy: Secondary | ICD-10-CM | POA: Diagnosis not present

## 2018-07-24 DIAGNOSIS — C3432 Malignant neoplasm of lower lobe, left bronchus or lung: Secondary | ICD-10-CM

## 2018-07-25 DIAGNOSIS — N201 Calculus of ureter: Secondary | ICD-10-CM | POA: Diagnosis not present

## 2018-07-26 DIAGNOSIS — C3432 Malignant neoplasm of lower lobe, left bronchus or lung: Secondary | ICD-10-CM | POA: Diagnosis not present

## 2018-07-26 DIAGNOSIS — Z510 Encounter for antineoplastic radiation therapy: Secondary | ICD-10-CM | POA: Diagnosis not present

## 2018-07-28 ENCOUNTER — Other Ambulatory Visit: Payer: Self-pay | Admitting: Oncology

## 2018-07-29 ENCOUNTER — Encounter (HOSPITAL_COMMUNITY)
Admission: RE | Admit: 2018-07-29 | Discharge: 2018-07-29 | Disposition: A | Discharge status: Home / Self Care | Payer: PPO | Source: Ambulatory Visit | Attending: Urology | Admitting: Urology

## 2018-07-29 ENCOUNTER — Other Ambulatory Visit (HOSPITAL_COMMUNITY)
Admission: RE | Admit: 2018-07-29 | Discharge: 2018-07-29 | Disposition: A | Discharge status: Home / Self Care | Payer: PPO | Source: Ambulatory Visit | Attending: Urology | Admitting: Urology

## 2018-07-29 ENCOUNTER — Encounter (HOSPITAL_COMMUNITY): Payer: Self-pay

## 2018-07-29 ENCOUNTER — Other Ambulatory Visit: Payer: Self-pay

## 2018-07-29 DIAGNOSIS — Z85118 Personal history of other malignant neoplasm of bronchus and lung: Secondary | ICD-10-CM | POA: Diagnosis not present

## 2018-07-29 DIAGNOSIS — Z1159 Encounter for screening for other viral diseases: Secondary | ICD-10-CM | POA: Insufficient documentation

## 2018-07-29 DIAGNOSIS — N200 Calculus of kidney: Secondary | ICD-10-CM | POA: Diagnosis present

## 2018-07-29 DIAGNOSIS — Z79899 Other long term (current) drug therapy: Secondary | ICD-10-CM | POA: Diagnosis not present

## 2018-07-29 DIAGNOSIS — Z85048 Personal history of other malignant neoplasm of rectum, rectosigmoid junction, and anus: Secondary | ICD-10-CM | POA: Diagnosis not present

## 2018-07-29 DIAGNOSIS — N135 Crossing vessel and stricture of ureter without hydronephrosis: Secondary | ICD-10-CM | POA: Insufficient documentation

## 2018-07-29 DIAGNOSIS — Z7901 Long term (current) use of anticoagulants: Secondary | ICD-10-CM | POA: Diagnosis not present

## 2018-07-29 DIAGNOSIS — N201 Calculus of ureter: Secondary | ICD-10-CM | POA: Insufficient documentation

## 2018-07-29 DIAGNOSIS — Z01812 Encounter for preprocedural laboratory examination: Secondary | ICD-10-CM | POA: Insufficient documentation

## 2018-07-29 DIAGNOSIS — N3021 Other chronic cystitis with hematuria: Secondary | ICD-10-CM | POA: Diagnosis not present

## 2018-07-29 DIAGNOSIS — K219 Gastro-esophageal reflux disease without esophagitis: Secondary | ICD-10-CM | POA: Diagnosis not present

## 2018-07-29 DIAGNOSIS — N202 Calculus of kidney with calculus of ureter: Secondary | ICD-10-CM | POA: Diagnosis not present

## 2018-07-29 HISTORY — DX: Hematuria, unspecified: R31.9

## 2018-07-29 LAB — CBC
HCT: 38.5 % — ABNORMAL LOW (ref 39.0–52.0)
Hemoglobin: 12.3 g/dL — ABNORMAL LOW (ref 13.0–17.0)
MCH: 34.9 pg — ABNORMAL HIGH (ref 26.0–34.0)
MCHC: 31.9 g/dL (ref 30.0–36.0)
MCV: 109.4 fL — ABNORMAL HIGH (ref 80.0–100.0)
Platelets: 177 10*3/uL (ref 150–400)
RBC: 3.52 MIL/uL — ABNORMAL LOW (ref 4.22–5.81)
RDW: 17.8 % — ABNORMAL HIGH (ref 11.5–15.5)
WBC: 5.3 10*3/uL (ref 4.0–10.5)
nRBC: 0 % (ref 0.0–0.2)

## 2018-07-29 LAB — BASIC METABOLIC PANEL
Anion gap: 8 (ref 5–15)
BUN: 17 mg/dL (ref 8–23)
CO2: 25 mmol/L (ref 22–32)
Calcium: 8.8 mg/dL — ABNORMAL LOW (ref 8.9–10.3)
Chloride: 104 mmol/L (ref 98–111)
Creatinine, Ser: 1.48 mg/dL — ABNORMAL HIGH (ref 0.61–1.24)
GFR calc Af Amer: 54 mL/min — ABNORMAL LOW (ref >60)
GFR calc non Af Amer: 47 mL/min — ABNORMAL LOW (ref >60)
Glucose, Bld: 127 mg/dL — ABNORMAL HIGH (ref 70–99)
Potassium: 4 mmol/L (ref 3.5–5.1)
Sodium: 137 mmol/L (ref 135–145)

## 2018-07-29 LAB — SARS CORONAVIRUS 2 (TAT 6-24 HRS): SARS Coronavirus 2: NEGATIVE

## 2018-07-29 NOTE — Progress Notes (Signed)
PRE-OP PROGRESS NOTE   Patient with hx of pulmonary fibrosis and metastatic CA. Vitals today o2 96%  Room air, bp 94/70, HR 110, rr 18.  Takes eliquis for hx dvt    Patient reports baseline cough and sob . Denies acute cardiac sx today. He states he has been instructed by his surgeon to continue his eliquis.   APP made aware of above vitals and patient hx of pulm fibrosis. Patient had face to face consult with APP today.

## 2018-07-29 NOTE — Patient Instructions (Signed)
YOU NEED TO HAVE A COVID 19 TEST ON_______MONDAY, July 20TH _______, THIS TEST MUST BE DONE BEFORE SURGERY, COME TO [place] ENTRANCE. ONCE YOUR COVID TEST IS COMPLETED, PLEASE BEGIN THE QUARANTINE INSTRUCTIONS AS OUTLINED IN YOUR HANDOUT.                Austin Yu    Your procedure is scheduled on: 08-01-2018   Report to [facility] Main  Entrance    Report to admitting at 7:55AM      Call this number if you have problems the morning of surgery [phone]    Remember: Do not eat food or drink liquids :After Midnight. BRUSH YOUR TEETH MORNING OF SURGERY AND RINSE YOUR MOUTH OUT, NO CHEWING GUM CANDY OR MINTS.     Take these medicines the morning of surgery with A SIP OF WATER: PROTONIX                                You may not have any metal on your body including hair pins and              piercings  Do not wear jewelry, make-up, lotions, powders or perfumes, deodorant                          Men may shave face and neck.   Do not bring valuables to the hospital. [place].  Contacts, dentures or bridgework may not be worn into surgery.      Patients discharged the day of surgery will not be allowed to drive home. IF YOU ARE HAVING SURGERY AND GOING HOME THE SAME DAY, YOU MUST HAVE AN ADULT TO DRIVE YOU HOME AND BE WITH YOU FOR 24 HOURS. YOU MAY GO HOME BY TAXI OR UBER OR ORTHERWISE, BUT AN ADULT MUST ACCOMPANY YOU HOME AND STAY WITH YOU FOR 24 HOURS.  Name and phone number of your driver:  Special Instructions: N/A              Please read over the following fact sheets you were given: _____________________________________________________________________             [facility] - Preparing for Surgery Before surgery, you can play an important role.  Because skin is not sterile, your skin needs to be as free of germs as possible.  You can reduce the number of germs on your skin  by washing with CHG (chlorahexidine gluconate) soap before surgery.  CHG is an antiseptic cleaner which kills germs and bonds with the skin to continue killing germs even after washing. Please DO NOT use if you have an allergy to CHG or antibacterial soaps.  If your skin becomes reddened/irritated stop using the CHG and inform your nurse when you arrive at Short Stay. Do not shave (including legs and underarms) for at least 48 hours prior to the first CHG shower.  You may shave your face/neck. Please follow these instructions carefully:  1.  Shower with CHG Soap the night before surgery and the  morning of Surgery.  2.  If you choose to wash your hair, wash your hair first as usual with your  normal  shampoo.  3.  After you shampoo, rinse your hair and body thoroughly to remove the  shampoo.                           4.  Use CHG as you would any other liquid soap.  You can apply chg directly  to the skin and wash                       Gently with a scrungie or clean washcloth.  5.  Apply the CHG Soap to your body ONLY FROM THE NECK DOWN.   Do not use on face/ open                           Wound or open sores. Avoid contact with eyes, ears mouth and genitals (private parts).                       Wash face,  Genitals (private parts) with your normal soap.             6.  Wash thoroughly, paying special attention to the area where your surgery  will be performed.  7.  Thoroughly rinse your body with warm water from the neck down.  8.  DO NOT shower/wash with your normal soap after using and rinsing off  the CHG Soap.                9.  Pat yourself dry with a clean towel.            10.  Wear clean pajamas.            11.  Place clean sheets on your bed the night of your first shower and do not  sleep with pets. Day of Surgery : Do not apply any lotions/deodorants the morning of surgery.  Please wear clean clothes to the hospital/surgery center.  FAILURE TO FOLLOW THESE INSTRUCTIONS MAY RESULT IN  THE CANCELLATION OF YOUR SURGERY PATIENT SIGNATURE_________________________________  NURSE SIGNATURE__________________________________  ________________________________________________________________________

## 2018-07-29 NOTE — Progress Notes (Signed)
EKG 04-04-2018 Epic   ECHO 04-23-2018 Epic   CT CHEST 07-24-2018 Epic   LOV ONCOLOGY DR [facility] 07-17-2018 Epic   LOV PULM DR [facility]   10-23-17 Epic

## 2018-07-31 DIAGNOSIS — C20 Malignant neoplasm of rectum: Secondary | ICD-10-CM | POA: Diagnosis not present

## 2018-07-31 DIAGNOSIS — Z7901 Long term (current) use of anticoagulants: Secondary | ICD-10-CM | POA: Diagnosis not present

## 2018-07-31 DIAGNOSIS — J8410 Pulmonary fibrosis, unspecified: Secondary | ICD-10-CM | POA: Diagnosis not present

## 2018-07-31 DIAGNOSIS — C7802 Secondary malignant neoplasm of left lung: Secondary | ICD-10-CM | POA: Diagnosis not present

## 2018-07-31 DIAGNOSIS — Z86718 Personal history of other venous thrombosis and embolism: Secondary | ICD-10-CM | POA: Diagnosis not present

## 2018-07-31 DIAGNOSIS — Z79899 Other long term (current) drug therapy: Secondary | ICD-10-CM | POA: Diagnosis not present

## 2018-07-31 MED ORDER — SODIUM CHLORIDE 0.9 % IV SOLN
3.0000 g | INTRAVENOUS | Status: AC
  Administered 2018-08-01: 3 g via INTRAVENOUS
  Filled 2018-07-31: qty 3

## 2018-07-31 NOTE — Anesthesia Preprocedure Evaluation (Addendum)
Anesthesia Evaluation    Airway Mallampati: II  TM Distance: >3 FB Neck ROM: Full    Dental no notable dental hx.    Pulmonary shortness of breath,    Pulmonary exam normal breath sounds clear to auscultation       Cardiovascular Normal cardiovascular exam Rhythm:Regular Rate:Normal     Neuro/Psych    GI/Hepatic GERD  ,  Endo/Other    Renal/GU      Musculoskeletal   Abdominal   Peds  Hematology   Anesthesia Other Findings Cancer  Reproductive/Obstetrics                             Anesthesia Physical Anesthesia Plan  ASA: III  Anesthesia Plan: General   Post-op Pain Management:    Induction: Intravenous  PONV Risk Score and Plan: 2 and Ondansetron, Midazolam and Treatment may vary due to age or medical condition  Airway Management Planned: LMA  Additional Equipment:   Intra-op Plan:   Post-operative Plan: Extubation in OR  Informed Consent: I have reviewed the patients History and Physical, chart, labs and discussed the procedure including the risks, benefits and alternatives for the proposed anesthesia with the patient or authorized representative who has indicated his/her understanding and acceptance.     Dental advisory given  Plan Discussed with: CRNA  Anesthesia Plan Comments: (See PAT note 07/29/2018, Konrad Felix, PA-C)       Anesthesia Quick Evaluation

## 2018-07-31 NOTE — Progress Notes (Signed)
Anesthesia Chart Review   Case: [id number] Date/Time: 08/01/18 0844   Procedure: CYSTOSCOPY/URETEROSCOPY/HOLMIUM LASER/STENT EXCHANGE (Right )   Anesthesia type: General   Pre-op diagnosis: RIGHT URETEROPELVIC JUNCTION STONE   Location: [facility]   Surgeon: Irine Seal, MD      DISCUSSION:73 yo never smoker with h/o Stage IIIa lung cancer (to be treated with chemoradiation), pulmonary fibrosis (follwed by Dr. Melvyn Novas, chronic cough and shortness of breath, currently at baseline), HLD, right internal jugular DVT (on Eliquis), GERD, BPH, metastatic rectal cancer (completed chemo, started radiation), right proximal and distal ureteral stone scheduled for above procedure 06/10/18 with Dr. Irine Seal.   Pt started on Eliquis due to acute deep vein thrombosis involving the right internal jugular on vascular US 06/04/2018.  He reports he has been advised by Dr. Jeffie Pollock not to hold Eliquis for procedure.  Followed by Dr. Betsy Coder.   Last seen by cardiologist, Dr. Vernell Leep, 05/07/2018 due to tachycardia which had resolved.  Per Dr. Bonney Roussel note echo with structurally normal heart with normal EF 04/23/2018- calculated EF 64%.  Advised to follow up prn.   Per Anesthesia note by Karoline Caldwell, PA-C prior to previous procedure:  Review of anesthesia records shows pt had a difficult extubation 05/18/2015: "Mr. Blane had a rocky emergence from general anesthesia with coughing and refusal to breathe regularly. CXR obtained in PACU is without acute changes. Chronic interstitial fibrotic changes noted. He is now without complaint and oxygen saturation is 92-93% on room air. Breathing unlabored. He has been given an incentive spirometer. Discussed events with Mr. Mcchristian. He understands his pulmonary condition and will seek care if any new symptoms."  He again had GA on 06/01/2015 and per anesthesia post note:"Patient with pulmonary fibrosis, strong cough reflex and hyperactive upper  airway. Tolerated deep general anesthesia with intermittent coughing and breath holds through out. Would consider pre treatment with albuterol neb (even without wheezing) and nebulized lidocaine to decrease airway hyperactivity and upper airway reflexes."  Most recently underwent general anesthesia 06/10/2018 with no anesthesia complications noted.    VS: BP 94/70   Pulse (!) 110   Temp 36.7 C (Oral)   Resp 18   Ht 5\' 7"  (1.702 m)   Wt 79.8 kg   SpO2 96%   BMI 27.57 kg/m   PROVIDERS: Deland Pretty, MD  Modesto Charon, MD is Cardio Thoracic Surgeon  Vernell Leep, MD is Cardiologist   Betsy Coder, MD is Oncologist LABS: Labs reviewed: Acceptable for surgery. (all labs ordered are listed, but only abnormal results are displayed)  Labs Reviewed  BASIC METABOLIC PANEL - Abnormal; Notable for the following components:      Result Value   Glucose, Bld 127 (*)    Creatinine, Ser 1.48 (*)    Calcium 8.8 (*)    GFR calc non Af Amer 47 (*)    GFR calc Af Amer 54 (*)    All other components within normal limits  CBC - Abnormal; Notable for the following components:   RBC 3.52 (*)    Hemoglobin 12.3 (*)    HCT 38.5 (*)    MCV 109.4 (*)    MCH 34.9 (*)    RDW 17.8 (*)    All other components within normal limits     IMAGES: CT Chest 07/23/2018 IMPRESSION: 1. Interval mild progression of mediastinal and hilar adenopathy. 2. Left lower lobe perihilar soft tissue nodule has increased in size from previous exam. The left lung base  nodule referenced on previous exam is similar in size to previous study. 3. Chronic interstitial lung disease compatible with usual interstitial pneumonitis. 4. Aortic Atherosclerosis (ICD10-I70.0). Coronary artery calcifications. 5. Hiatal hernia 6. Persistent right-sided hydronephrosis. On previous examination  06/04/2018 VAS [place]  Summary:  Right: No evidence of superficial vein thrombosis in the upper extremity.  Findings consistent with acute deep vein thrombosis involving the right internal jugular veins.  Left: No evidence of thrombosis in the subclavian.  EKG: 04/04/2018 Rate 114 bpm Sinus tachycardia Consider left atrial enlargement Borderline left axis deviation Abnormal R-wave progression, late transition No significant change since last tracing  CV: Echocardiogram 04/23/2018: Left ventricle cavity is normal in size. Mild concentric hypertrophy of the left ventricle. Normal global wall motion. Doppler evidence of grade I (impaired) diastolic dysfunction, normal LAP. Calculated EF 64%. Mild (Grade I) aortic regurgitation. Inadequate TR jet to estimate pulmonary artery systolic pressure. Normal right atrial pressure. Past Medical History:  Diagnosis Date  . Abnormal chest sounds   . BPH (benign prostatic hypertrophy)   . Cancer [facility])    rectum cancer; dx 01/21/2018 by Dr. Loletha Carrow by colonoscopy  . Complication of anesthesia    ISSUE W/ EXTUBATION 05-18-2015 IN [facility] -- REFER TO ANESTHESIA NOTE''S;    . DVT (deep venous thrombosis) (HCC)    right jugular vein. now on eliquis   . Dyspnea    ongoing d/t hx of pulmonary fibrosis   . Elevated PSA   . GERD (gastroesophageal reflux disease)   . Hematuria    ONGOING   . History of airway aspiration   . History of kidney stones   . Hyperlipidemia   . Hyperplasia of prostate without lower urinary tract symptoms (LUTS)   . Left ureteral stone   . Lung nodule   . Nephrolithiasis   . Postinflammatory pulmonary fibrosis (HCC)    PULMOLOGIST-  DR ZDGL-  IDIOPATHIC   . Pulmonary fibrosis ([place])   . Rectal cancer ([place])   . Sinus tachycardia by electrocardiogram    states he was told this was caused by chemo drugs; now compelted chemo, reports no acute cardiac symtoms,   . Strawberry hemangioma of skin   . Upper airway cough syndrome    PULMOLOGIST-  DR OVFI    Past Surgical History:  Procedure Laterality Date  . BIOPSY  01/21/2018    Procedure: BIOPSY;  Surgeon: Doran Stabler, MD;  Location: [facility];  Service: Gastroenterology;;  . COLONOSCOPY WITH PROPOFOL N/A 01/21/2018   Procedure: COLONOSCOPY WITH PROPOFOL;  Surgeon: Doran Stabler, MD;  Location: [facility];  Service: Gastroenterology;  Laterality: N/A;  . CYSTOSCOPY WITH HOLMIUM LASER LITHOTRIPSY Left 06/01/2015   Procedure: CYSTOSCOPY WITH HOLMIUM LASER LITHOTRIPSY;  Surgeon: Irine Seal, MD;  Location: [facility];  Service: Urology;  Laterality: Left;  . CYSTOSCOPY WITH RETROGRADE PYELOGRAM, URETEROSCOPY AND STENT PLACEMENT Right 06/10/2018   Procedure: CYSTOSCOPY WITH RIGHT RETROGRADE PYELOGRAM, URETEROSCOPY HOLMIUM LASER AND STENT PLACEMENT basket extraction stone;  Surgeon: Irine Seal, MD;  Location[facility];  Service: Urology;  Laterality: Right;  . CYSTOSCOPY WITH URETEROSCOPY, STONE BASKETRY AND STENT PLACEMENT Left 05/18/2015   Procedure: CYSTOSCOPY WITH STENT PLACEMENT AND RETROGRADE PYELOGRAM;  Surgeon: Irine Seal, MD;  Location[facility];  Service: Urology;  Laterality: Left;  . CYSTOSCOPY WITH URETEROSCOPY, STONE BASKETRY AND STENT PLACEMENT Left 06/01/2015   Procedure: CYSTOSCOPY WITH URETEROSCOPY, STONE BASKETRY AND STENT PLACEMENT;  Surgeon: Irine Seal, MD;  Location: [facility];  Service: Urology;  Laterality: Left;  . EXTRACORPOREAL SHOCK WAVE LITHOTRIPSY  2007  . IR IMAGING GUIDED PORT INSERTION  02/14/2018  . TONSILLECTOMY  age 45-4  . VIDEO BRONCHOSCOPY WITH ENDOBRONCHIAL NAVIGATION Left 02/07/2018   Procedure: VIDEO BRONCHOSCOPY WITH ENDOBRONCHIAL NAVIGATION;  Surgeon: Collene Gobble, MD;  Location: [place];  Service: Thoracic;  Laterality: Left;  Marland Kitchen VIDEO BRONCHOSCOPY WITH ENDOBRONCHIAL ULTRASOUND Left 02/07/2018   Procedure: VIDEO BRONCHOSCOPY WITH ENDOBRONCHIAL ULTRASOUND;  Surgeon: Collene Gobble, MD;  Location: [facility];  Service: Thoracic;  Laterality: Left;    MEDICATIONS: . apixaban (ELIQUIS) 2.5 MG TABS tablet   . capecitabine (XELODA) 500 MG tablet  . famotidine (PEPCID) 20 MG tablet  . lidocaine-prilocaine (EMLA) cream  . pantoprazole (PROTONIX) 40 MG tablet  . prochlorperazine (COMPAZINE) 10 MG tablet  . tamsulosin (FLOMAX) 0.4 MG CAPS capsule   No current facility-administered medications for this encounter.    Derrill Memo ON 08/01/2018] Ampicillin-Sulbactam (UNASYN) 3 g in sodium chloride 0.9 % 100 mL IVPB     Maia Plan [facility] Pre-Surgical Testing [phone] 07/31/18  9:30 AM

## 2018-07-31 NOTE — H&P (Signed)
CC: I have kidney stones.  HPI: Austin Yu is a 73 year-old male established patient who is here for renal calculi.    Austin Yu returns today in f/u from recent right ureteroscopy with laser and stenting. He was found to have follicular cystitis and had a positive culture from the right kidney. He remains on antibiotic. He is getting radiation therapy which he will complete on July 9 and I spoke with Dr. Lisbeth Renshaw and will delay treatment of the remaining stones until after that. He remains on Eliquis and has bloody urine with the stent. He remains on ampicillin and will continue a suppressive dose until his next procedure. His stones were consistent with struvite but the analysis is pending. He has persistent urgency and frequency.     ALLERGIES: No Allergies    MEDICATIONS: Ampicillin Trihydrate 500 mg capsule 1 capsule PO Q HS take 1 tablet 4 times daily for 7 days and then transition to 1 tablet nightly  Eliquis  Famotidine 20 mg tablet Oral  Pantoprazole Sodium 40 mg tablet, delayed release Oral     GU PSH: Cystoscopy Insert Stent - 2017 Ureteroscopic laser litho, Right - 06/10/2018, 2017 Ureteroscopic stone removal - 2017      PSH Notes: Cystoscopy With Ureteroscopy With Removal Of Calculus, Cystoscopy Ureteroscopy Lithotripsy Incl Insert Indwelling Ureter Stent, Cystoscopy With Insertion Of Ureteral Stent Left, No Surgical Problems, rectal cancer, lung cancer   NON-GU PSH: None   GU PMH: History of urolithiasis - 05/23/2017 Elevated PSA, He has an elevated PSA with a benign but enlarged prostate. I am going to have him return this afternoon with a UA for culture because of his prior history of UTI's. If there is evidence of infection, even if it is multiple species, I will get him a month of antibiotics and repeat the PSA. I have requested additional PSA's from Dr. Shelia Media for comparison and if this is an isolated elevation, I will probably get a confirmatory level before considering a  biopsy and I may want to get an MRIP of the prostate to assess his risk before doing a biopsy particular in light of his history of pulmonary fibrosis. - 04/11/2017 Personal Hx Urinary Tract Infections, He had a UTI at his last visit and was unable to get a UA today. He will return tomorrow to give a specimen for UA and culture. - 11/27/2016 Renal and ureteral calculus - 2017 Renal calculus, Nephrolithiasis - 2017 Ureteral calculus, Calculus of distal left ureter - 2017 BPH w/LUTS, Benign prostatic hypertrophy (BPH) with incomplete bladder emptying - 2017 Urinary Tract Inf, Unspec site, Pyuria - 2017 Other microscopic hematuria, Microscopic hematuria - 2017    NON-GU PMH: Encounter for general adult medical examination without abnormal findings, Encounter for preventive health examination - 2017 Personal history of other diseases of the digestive system, History of esophageal reflux - 2017 Pulmonary fibrosis, unspecified    FAMILY HISTORY: Congestive Heart Failure - Runs In Family Death of family member - Runs In Family No pertinent family history - Runs In Family   SOCIAL HISTORY: Marital Status: Single Preferred Language: English; Ethnicity: Not Hispanic Or Latino; Race: White Current Smoking Status: Patient has never smoked.   Tobacco Use Assessment Completed: Used Tobacco in last 30 days?     Notes: Alcohol use, No caffeine use, Single, Number of children, Never a smoker   REVIEW OF SYSTEMS:    GU Review Male:   Patient reports frequent urination and hard to postpone urination. Patient denies burning/  pain with urination, get up at night to urinate, leakage of urine, stream starts and stops, trouble starting your stream, have to strain to urinate , erection problems, and penile pain.  Gastrointestinal (Upper):   Patient denies nausea, vomiting, and indigestion/ heartburn.  Gastrointestinal (Lower):   Patient denies diarrhea and constipation.  Constitutional:   Patient denies fever,  night sweats, weight loss, and fatigue.  Skin:   Patient denies skin rash/ lesion and itching.  Eyes:   Patient denies blurred vision and double vision.  Ears/ Nose/ Throat:   Patient denies sore throat and sinus problems.  Hematologic/Lymphatic:   Patient denies swollen glands and easy bruising.  Cardiovascular:   Patient denies leg swelling and chest pains.  Respiratory:   Patient denies cough and shortness of breath.  Endocrine:   Patient denies excessive thirst.  Musculoskeletal:   Patient denies back pain and joint pain.  Neurological:   Patient denies headaches and dizziness.  Psychologic:   Patient denies depression and anxiety.   VITAL SIGNS:      06/17/2018 02:05 PM  BP 130/78 mmHg  Pulse 101 /min  Temperature 97.5 F / 36.3 C   PAST DATA REVIEWED:  Source Of History:  Patient  Urine Test Review:   Urinalysis   05/16/17 07/13/05  PSA  Total PSA 3.11 ng/mL 2.07     PROCEDURES:          Urinalysis w/Scope Dipstick Dipstick Cont'd Micro  Color: Red Bilirubin: Invalid mg/dL WBC/hpf: 6 - 10/hpf  Appearance: Turbid Ketones: Invalid mg/dL RBC/hpf: >60/hpf  Specific Gravity: Invalid Blood: Invalid ery/uL Bacteria: NS (Not Seen)  pH: Invalid Protein: Invalid mg/dL Cystals: NS (Not Seen)  Glucose: Invalid mg/dL Urobilinogen: Invalid mg/dL Casts: NS (Not Seen)    Nitrites: Invalid Trichomonas: Not Present    Leukocyte Esterase: Invalid leu/uL Mucous: Not Present      Epithelial Cells: NS (Not Seen)      Yeast: NS (Not Seen)      Sperm: Not Present    Notes: no dip due to discoloration/unspun urine microscopic performed    ASSESSMENT:      ICD-10 Details  1 GU:   Renal calculus - N20.0 He is doing well following ureteroscopic management of the right distal stone, but will need treatment of the renal stones when he has completed radiation in a month. I will get that scheduled.   2   Chronic cystitis (with hematuria) - N30.21 Improving - He has enterococcus on his last  culture and will be kept on ampicillin for suppression until the procedure because of the chronic cystitis and what appear to be infection stones.      PLAN:           Orders Labs Urine Culture          Schedule Return Visit/Planned Activity: 1 Month - Schedule Surgery          Document

## 2018-08-01 ENCOUNTER — Other Ambulatory Visit: Payer: PPO

## 2018-08-01 ENCOUNTER — Ambulatory Visit (HOSPITAL_COMMUNITY): Payer: PPO

## 2018-08-01 ENCOUNTER — Encounter (HOSPITAL_COMMUNITY)
Admission: RE | Disposition: A | Discharge status: Home / Self Care | Payer: Self-pay | Source: Home / Self Care | Attending: Urology

## 2018-08-01 ENCOUNTER — Other Ambulatory Visit: Payer: Self-pay

## 2018-08-01 ENCOUNTER — Ambulatory Visit (HOSPITAL_COMMUNITY): Payer: PPO | Admitting: Anesthesiology

## 2018-08-01 ENCOUNTER — Encounter (HOSPITAL_COMMUNITY): Payer: Self-pay | Admitting: *Deleted

## 2018-08-01 ENCOUNTER — Ambulatory Visit (HOSPITAL_COMMUNITY): Payer: PPO | Admitting: Physician Assistant

## 2018-08-01 ENCOUNTER — Ambulatory Visit (HOSPITAL_COMMUNITY)
Admission: RE | Admit: 2018-08-01 | Discharge: 2018-08-01 | Disposition: A | Discharge status: Home / Self Care | Payer: PPO | Attending: Urology | Admitting: Urology

## 2018-08-01 ENCOUNTER — Ambulatory Visit: Payer: PPO | Admitting: Nurse Practitioner

## 2018-08-01 DIAGNOSIS — C20 Malignant neoplasm of rectum: Secondary | ICD-10-CM | POA: Diagnosis not present

## 2018-08-01 DIAGNOSIS — Z1159 Encounter for screening for other viral diseases: Secondary | ICD-10-CM | POA: Insufficient documentation

## 2018-08-01 DIAGNOSIS — K219 Gastro-esophageal reflux disease without esophagitis: Secondary | ICD-10-CM | POA: Diagnosis not present

## 2018-08-01 DIAGNOSIS — E785 Hyperlipidemia, unspecified: Secondary | ICD-10-CM | POA: Diagnosis not present

## 2018-08-01 DIAGNOSIS — N201 Calculus of ureter: Secondary | ICD-10-CM | POA: Diagnosis not present

## 2018-08-01 DIAGNOSIS — Z85118 Personal history of other malignant neoplasm of bronchus and lung: Secondary | ICD-10-CM | POA: Diagnosis not present

## 2018-08-01 DIAGNOSIS — N3021 Other chronic cystitis with hematuria: Secondary | ICD-10-CM | POA: Insufficient documentation

## 2018-08-01 DIAGNOSIS — N200 Calculus of kidney: Secondary | ICD-10-CM | POA: Diagnosis not present

## 2018-08-01 DIAGNOSIS — Z7901 Long term (current) use of anticoagulants: Secondary | ICD-10-CM | POA: Diagnosis not present

## 2018-08-01 DIAGNOSIS — Z85048 Personal history of other malignant neoplasm of rectum, rectosigmoid junction, and anus: Secondary | ICD-10-CM | POA: Diagnosis not present

## 2018-08-01 DIAGNOSIS — Z79899 Other long term (current) drug therapy: Secondary | ICD-10-CM | POA: Diagnosis not present

## 2018-08-01 DIAGNOSIS — N202 Calculus of kidney with calculus of ureter: Secondary | ICD-10-CM | POA: Diagnosis not present

## 2018-08-01 DIAGNOSIS — J84112 Idiopathic pulmonary fibrosis: Secondary | ICD-10-CM | POA: Diagnosis not present

## 2018-08-01 HISTORY — PX: CYSTOSCOPY/URETEROSCOPY/HOLMIUM LASER/STENT PLACEMENT: SHX6546

## 2018-08-01 SURGERY — CYSTOSCOPY/URETEROSCOPY/HOLMIUM LASER/STENT PLACEMENT
Anesthesia: General | Laterality: Right

## 2018-08-01 MED ORDER — PHENYLEPHRINE 40 MCG/ML (10ML) SYRINGE FOR IV PUSH (FOR BLOOD PRESSURE SUPPORT)
PREFILLED_SYRINGE | INTRAVENOUS | Status: AC
  Filled 2018-08-01: qty 10

## 2018-08-01 MED ORDER — PROPOFOL 10 MG/ML IV BOLUS
INTRAVENOUS | Status: AC
  Filled 2018-08-01: qty 40

## 2018-08-01 MED ORDER — SODIUM CHLORIDE 0.9% FLUSH: 3.0000 mL | Freq: Two times a day (BID) | INTRAVENOUS | Status: DC

## 2018-08-01 MED ORDER — FENTANYL CITRATE (PF) 100 MCG/2ML IJ SOLN
INTRAMUSCULAR | Status: AC
  Filled 2018-08-01: qty 2

## 2018-08-01 MED ORDER — OXYCODONE HCL 5 MG/5ML PO SOLN: 5.0000 mg | Freq: Once | ORAL | Status: DC | PRN

## 2018-08-01 MED ORDER — PHENYLEPHRINE 40 MCG/ML (10ML) SYRINGE FOR IV PUSH (FOR BLOOD PRESSURE SUPPORT)
PREFILLED_SYRINGE | INTRAVENOUS | Status: DC | PRN
  Administered 2018-08-01 (×5): 80 ug via INTRAVENOUS

## 2018-08-01 MED ORDER — HYDROMORPHONE HCL 1 MG/ML IJ SOLN: 0.2500 mg | INTRAMUSCULAR | Status: DC | PRN

## 2018-08-01 MED ORDER — GLYCOPYRROLATE PF 0.2 MG/ML IJ SOSY
PREFILLED_SYRINGE | INTRAMUSCULAR | Status: AC
  Filled 2018-08-01: qty 1

## 2018-08-01 MED ORDER — ONDANSETRON HCL 4 MG/2ML IJ SOLN
INTRAMUSCULAR | Status: DC | PRN
  Administered 2018-08-01: 4 mg via INTRAVENOUS

## 2018-08-01 MED ORDER — SODIUM CHLORIDE 0.9 % IR SOLN
Status: DC | PRN
  Administered 2018-08-01 (×2): 3000 mL via INTRAVESICAL

## 2018-08-01 MED ORDER — SODIUM CHLORIDE 0.9% FLUSH: 3.0000 mL | INTRAVENOUS | Status: DC | PRN

## 2018-08-01 MED ORDER — OXYCODONE HCL 5 MG PO TABS: 5.0000 mg | ORAL_TABLET | ORAL | Status: DC | PRN

## 2018-08-01 MED ORDER — MORPHINE SULFATE (PF) 4 MG/ML IV SOLN: 2.0000 mg | INTRAVENOUS | Status: DC | PRN

## 2018-08-01 MED ORDER — SODIUM CHLORIDE 0.9 % IV SOLN: 250.0000 mL | INTRAVENOUS | Status: DC | PRN

## 2018-08-01 MED ORDER — LIDOCAINE HCL (CARDIAC) PF 100 MG/5ML IV SOSY
PREFILLED_SYRINGE | INTRAVENOUS | Status: DC | PRN
  Administered 2018-08-01: 80 mg via INTRAVENOUS

## 2018-08-01 MED ORDER — AMPICILLIN 500 MG PO CAPS: 500.0000 mg | ORAL_CAPSULE | Freq: Every day | ORAL | 0 refills | Status: DC

## 2018-08-01 MED ORDER — LACTATED RINGERS IV SOLN
INTRAVENOUS | Status: DC
  Administered 2018-08-01: 07:00:00 via INTRAVENOUS

## 2018-08-01 MED ORDER — EPHEDRINE SULFATE 50 MG/ML IJ SOLN
INTRAMUSCULAR | Status: DC | PRN
  Administered 2018-08-01: 5 mg via INTRAVENOUS

## 2018-08-01 MED ORDER — DEXAMETHASONE SODIUM PHOSPHATE 4 MG/ML IJ SOLN
INTRAMUSCULAR | Status: DC | PRN
  Administered 2018-08-01: 8 mg via INTRAVENOUS

## 2018-08-01 MED ORDER — FENTANYL CITRATE (PF) 100 MCG/2ML IJ SOLN
INTRAMUSCULAR | Status: DC | PRN
  Administered 2018-08-01: 25 ug via INTRAVENOUS
  Administered 2018-08-01: 75 ug via INTRAVENOUS
  Administered 2018-08-01: 25 ug via INTRAVENOUS
  Administered 2018-08-01: 50 ug via INTRAVENOUS
  Administered 2018-08-01: 25 ug via INTRAVENOUS

## 2018-08-01 MED ORDER — ACETAMINOPHEN 650 MG RE SUPP
650.0000 mg | RECTAL | Status: DC | PRN
  Filled 2018-08-01: qty 1

## 2018-08-01 MED ORDER — OXYCODONE HCL 5 MG PO TABS: 5.0000 mg | ORAL_TABLET | Freq: Once | ORAL | Status: DC | PRN

## 2018-08-01 MED ORDER — PROPOFOL 10 MG/ML IV BOLUS
INTRAVENOUS | Status: DC | PRN
  Administered 2018-08-01: 170 mg via INTRAVENOUS

## 2018-08-01 MED ORDER — EPHEDRINE 5 MG/ML INJ
INTRAVENOUS | Status: AC
  Filled 2018-08-01: qty 20

## 2018-08-01 MED ORDER — PROMETHAZINE HCL 25 MG/ML IJ SOLN: 6.2500 mg | INTRAMUSCULAR | Status: DC | PRN

## 2018-08-01 MED ORDER — ACETAMINOPHEN 325 MG PO TABS: 650.0000 mg | ORAL_TABLET | ORAL | Status: DC | PRN

## 2018-08-01 MED ORDER — LIDOCAINE HCL URETHRAL/MUCOSAL 2 % EX GEL
CUTANEOUS | Status: AC
  Filled 2018-08-01: qty 5

## 2018-08-01 MED ORDER — BELLADONNA ALKALOIDS-OPIUM 16.2-30 MG RE SUPP
RECTAL | Status: AC
  Filled 2018-08-01: qty 1

## 2018-08-01 MED ORDER — EPHEDRINE 5 MG/ML INJ
INTRAVENOUS | Status: AC
  Filled 2018-08-01: qty 10

## 2018-08-01 MED ORDER — HYDROCODONE-ACETAMINOPHEN 5-325 MG PO TABS: 1.0000 | ORAL_TABLET | Freq: Four times a day (QID) | ORAL | 0 refills | Status: DC | PRN

## 2018-08-01 SURGICAL SUPPLY — 26 items
BAG URO CATCHER STRL LF (MISCELLANEOUS) ×3 IMPLANT
BASKET STONE NCOMPASS (UROLOGICAL SUPPLIES) IMPLANT
CATH URET 5FR 28IN OPEN ENDED (CATHETERS) IMPLANT
CATH URET DUAL LUMEN 6-10FR 50 (CATHETERS) ×5 IMPLANT
CLOTH BEACON ORANGE TIMEOUT ST (SAFETY) ×3 IMPLANT
COVER WAND RF STERILE (DRAPES) IMPLANT
EXTRACTOR STONE NITINOL NGAGE (UROLOGICAL SUPPLIES) ×3 IMPLANT
FIBER LASER FLEXIVA 1000 (UROLOGICAL SUPPLIES) IMPLANT
FIBER LASER FLEXIVA 365 (UROLOGICAL SUPPLIES) IMPLANT
FIBER LASER FLEXIVA 550 (UROLOGICAL SUPPLIES) IMPLANT
FIBER LASER TRAC TIP (UROLOGICAL SUPPLIES) ×4 IMPLANT
GLOVE SURG SS PI 8.0 STRL IVOR (GLOVE) IMPLANT
GOWN STRL REUS W/TWL XL LVL3 (GOWN DISPOSABLE) ×3 IMPLANT
GUIDEWIRE ANG ZIPWIRE 038X150 (WIRE) ×3 IMPLANT
GUIDEWIRE STR DUAL SENSOR (WIRE) ×5 IMPLANT
IV NS 1000ML (IV SOLUTION) ×3
IV NS 1000ML BAXH (IV SOLUTION) ×1 IMPLANT
IV NS IRRIG 3000ML ARTHROMATIC (IV SOLUTION) ×3 IMPLANT
KIT TURNOVER KIT A (KITS) IMPLANT
MANIFOLD NEPTUNE II (INSTRUMENTS) ×3 IMPLANT
PACK CYSTO (CUSTOM PROCEDURE TRAY) ×3 IMPLANT
SHEATH URETERAL 12FRX35CM (MISCELLANEOUS) ×6 IMPLANT
STENT URET 6FRX26 CONTOUR (STENTS) ×2 IMPLANT
TUBING CONNECTING 10 (TUBING) ×2 IMPLANT
TUBING CONNECTING 10' (TUBING) ×1
TUBING UROLOGY SET (TUBING) ×3 IMPLANT

## 2018-08-01 NOTE — Discharge Instructions (Signed)
Ureteral Stent Implantation, Care After This sheet gives you information about how to care for yourself after your procedure. Your health care provider may also give you more specific instructions. If you have problems or questions, contact your health care provider. What can I expect after the procedure? After the procedure, it is common to have:  Nausea.  Mild pain when you urinate. You may feel this pain in your lower back or lower abdomen. The pain should stop within a few minutes after you urinate. This may last for up to 1 week.  A small amount of blood in your urine for several days. Follow these instructions at home: Medicines  Take over-the-counter and prescription medicines only as told by your health care provider.  If you were prescribed an antibiotic medicine, take it as told by your health care provider. Do not stop taking the antibiotic even if you start to feel better.  Do not drive for 24 hours if you were given a sedative during your procedure.  Ask your health care provider if the medicine prescribed to you requires you to avoid driving or using heavy machinery. Activity  Rest as told by your health care provider.  Avoid sitting for a long time without moving. Get up to take short walks every 1-2 hours. This is important to improve blood flow and breathing. Ask for help if you feel weak or unsteady.  Return to your normal activities as told by your health care provider. Ask your health care provider what activities are safe for you. General instructions   Watch for any blood in your urine. Call your health care provider if the amount of blood in your urine increases.  If you have a catheter: ? Follow instructions from your health care provider about taking care of your catheter and collection bag. ? Do not take baths, swim, or use a hot tub until your health care provider approves. Ask your health care provider if you may take showers. You may only be allowed to  take sponge baths.  Drink enough fluid to keep your urine pale yellow.  Do not use any products that contain nicotine or tobacco, such as cigarettes, e-cigarettes, and chewing tobacco. These can delay healing after surgery. If you need help quitting, ask your health care provider.  Keep all follow-up visits as told by your health care provider. This is important. Contact a health care provider if:  You have pain that gets worse or does not get better with medicine, especially pain when you urinate.  You have difficulty urinating.  You feel nauseous or you vomit repeatedly during a period of more than 2 days after the procedure. Get help right away if:  Your urine is dark red or has blood clots in it.  You are leaking urine (have incontinence).  The end of the stent comes out of your urethra.  You cannot urinate.  You have sudden, sharp, or severe pain in your abdomen or lower back.  You have a fever.  You have swelling or pain in your legs.  You have difficulty breathing. Summary  After the procedure, it is common to have mild pain when you urinate that goes away within a few minutes after you urinate. This may last for up to 1 week.  Watch for any blood in your urine. Call your health care provider if the amount of blood in your urine increases.  Take over-the-counter and prescription medicines only as told by your health care provider.  Drink  enough fluid to keep your urine pale yellow. This information is not intended to replace advice given to you by your health care provider. Make sure you discuss any questions you have with your health care provider. Document Released: 08/28/2012 Document Revised: 10/02/2017 Document Reviewed: 10/03/2017 Elsevier Patient Education  2020 [employer] Anesthesia, Adult, Care After This sheet gives you information about how to care for yourself after your procedure. Your health care provider may also give you more specific  instructions. If you have problems or questions, contact your health care provider. What can I expect after the procedure? After the procedure, the following side effects are common:  Pain or discomfort at the IV site.  Nausea.  Vomiting.  Sore throat.  Trouble concentrating.  Feeling cold or chills.  Weak or tired.  Sleepiness and fatigue.  Soreness and body aches. These side effects can affect parts of the body that were not involved in surgery. Follow these instructions at home:  For at least 24 hours after the procedure:  Have a responsible adult stay with you. It is important to have someone help care for you until you are awake and alert.  Rest as needed.  Do not: ? Participate in activities in which you could fall or become injured. ? Drive. ? Use heavy machinery. ? Drink alcohol. ? Take sleeping pills or medicines that cause drowsiness. ? Make important decisions or sign legal documents. ? Take care of children on your own. Eating and drinking  Follow any instructions from your health care provider about eating or drinking restrictions.  When you feel hungry, start by eating small amounts of foods that are soft and easy to digest (bland), such as toast. Gradually return to your regular diet.  Drink enough fluid to keep your urine pale yellow.  If you vomit, rehydrate by drinking water, juice, or clear broth. General instructions  If you have sleep apnea, surgery and certain medicines can increase your risk for breathing problems. Follow instructions from your health care provider about wearing your sleep device: ? Anytime you are sleeping, including during daytime naps. ? While taking prescription pain medicines, sleeping medicines, or medicines that make you drowsy.  Return to your normal activities as told by your health care provider. Ask your health care provider what activities are safe for you.  Take over-the-counter and prescription medicines only  as told by your health care provider.  If you smoke, do not smoke without supervision.  Keep all follow-up visits as told by your health care provider. This is important. Contact a health care provider if:  You have nausea or vomiting that does not get better with medicine.  You cannot eat or drink without vomiting.  You have pain that does not get better with medicine.  You are unable to pass urine.  You develop a skin rash.  You have a fever.  You have redness around your IV site that gets worse. Get help right away if:  You have difficulty breathing.  You have chest pain.  You have blood in your urine or stool, or you vomit blood. Summary  After the procedure, it is common to have a sore throat or nausea. It is also common to feel tired.  Have a responsible adult stay with you for the first 24 hours after general anesthesia. It is important to have someone help care for you until you are awake and alert.  When you feel hungry, start by eating small amounts of  foods that are soft and easy to digest (bland), such as toast. Gradually return to your regular diet.  Drink enough fluid to keep your urine pale yellow.  Return to your normal activities as told by your health care provider. Ask your health care provider what activities are safe for you. This information is not intended to replace advice given to you by your health care provider. Make sure you discuss any questions you have with your health care provider. Document Released: 04/03/2000 Document Revised: 12/29/2016 Document Reviewed: 08/11/2016 Elsevier Patient Education  2020 [employer].

## 2018-08-01 NOTE — Anesthesia Postprocedure Evaluation (Signed)
Anesthesia Post Note  Patient: Austin Yu  Procedure(s) Performed: CYSTOSCOPY/URETEROSCOPY/HOLMIUM LASER/STENT EXCHANGE (Right )     Patient location during evaluation: PACU Anesthesia Type: General Level of consciousness: awake and alert Pain management: pain level controlled Vital Signs Assessment: post-procedure vital signs reviewed and stable Respiratory status: spontaneous breathing, nonlabored ventilation and respiratory function stable Cardiovascular status: blood pressure returned to baseline and stable Postop Assessment: no apparent nausea or vomiting Anesthetic complications: no    Last Vitals:  Vitals:   08/01/18 1115 08/01/18 1132  BP: 108/72 110/83  Pulse: 89 91  Resp: 14 18  Temp: 36.7 C 36.7 C  SpO2: 96% 95%    Last Pain:  Vitals:   08/01/18 1132  TempSrc: Oral  PainSc: 0-No pain                 Lynda Rainwater

## 2018-08-01 NOTE — Anesthesia Procedure Notes (Signed)
Procedure Name: LMA Insertion Date/Time: 08/01/2018 8:52 AM Performed by: Lavina Hamman, CRNA Pre-anesthesia Checklist: Patient identified, Emergency Drugs available, Suction available and Patient being monitored Patient Re-evaluated:Patient Re-evaluated prior to induction Oxygen Delivery Method: Circle System Utilized Preoxygenation: Pre-oxygenation with 100% oxygen Induction Type: IV induction Ventilation: Mask ventilation without difficulty LMA: LMA inserted LMA Size: 4.0 Number of attempts: 1 Airway Equipment and Method: Bite block Placement Confirmation: positive ETCO2 Tube secured with: Tape Dental Injury: Teeth and Oropharynx as per pre-operative assessment

## 2018-08-01 NOTE — Transfer of Care (Signed)
Immediate Anesthesia Transfer of Care Note  Patient: Austin Yu  Procedure(s) Performed: Procedure(s): CYSTOSCOPY/URETEROSCOPY/HOLMIUM LASER/STENT EXCHANGE (Right)  Patient Location: PACU  Anesthesia Type:General  Level of Consciousness:  sedated, patient cooperative and responds to stimulation  Airway & Oxygen Therapy:Patient Spontanous Breathing and Patient connected to face mask oxgen  Post-op Assessment:  Report given to PACU RN and Post -op Vital signs reviewed and stable  Post vital signs:  Reviewed and stable  Last Vitals:  Vitals:   08/01/18 0704 08/01/18 1030  BP: 102/76 (P) 111/71  Pulse: (!) 104 (P) 97  Resp: 18 (P) 20  Temp: 37.1 C   SpO2: 92% (P) 832%    Complications: No apparent anesthesia complications

## 2018-08-01 NOTE — Op Note (Signed)
Procedure: 1.  Cystoscopy with removal of right double-J stent. 2.  Right ureteroscopic stone extraction with holmium laser application for a right 1.6 cm proximal ureteral stone.  3.  Right ureteroscopic stone extraction with holmium laser application for a 2 cm right lower pole stone with insertion of right double-J stent.  Preoperative diagnosis: 1.6 cm right proximal ureteral stone and 2 cm right lower pole stone.  Postoperative diagnosis: Same.  Surgeon: Dr. Irine Seal.  Anesthesia: General.  Specimen: Stone fragments which will be taken to the office for analysis.  Drain: 6 [place] by 26 cm contour double-J stent.  EBL: Minimal.  Complications: None.  Indications: The patient is a 73 year old male with a history of right ureteral and renal stones with an enterococcal infection.  He originally underwent right distal ureteral stone extraction and placement of a right ureteral stent on 06/10/2018.  He was treated with ampicillin for the infection and kept on ampicillin suppression as he went through radiation therapy for lung cancer and he returns now for ureteroscopic management of the large 1.6 cm right proximal ureteral stone and 2 cm right lower pole stone.  Because of a history of DVT he has to remain on Eliquis so percutaneous nephrolithotomy was not possible nor was a lithotripsy.  Procedure: He was taken operating room where general anesthetic was induced.  He was given Unasyn.  He was placed in lithotomy position and fitted with PAS hose.  His perineum and genitalia were prepped with Betadine solution and he was draped in usual sterile fashion.  Cystoscopy was performed using a 23 [place] scope and 30 degree lens.  Examination revealed a normal urethra.  The external sphincter was intact.  The prostatic urethra had bilobar hyperplasia with some obstruction.  Examination nation of the bladder was compromised by bloody urine but no abnormalities have been noted previously.  There was a  stent at the right ureteral orifice.  The stent was grasped and moved to the urethral meatus.  A sensor wire was passed through the stent to the kidney and the stent was removed over the wire.  An 8 French dual-lumen catheter was passed over the wire and a second sensor wire was negotiated by the large impacted ureteral stone.  Initially the wire coiled below the stone but with further manipulation I was able to get it by into the renal collecting system.  The inner core of a 35 cm 12/14 French ureteral access sheath was passed over the wire to just below the stone and then the assembled sheath was placed.  The inner core and wire were removed leaving just the safety wire adjacent to the sheath and stone.  The dual-lumen digital ureteroscope was passed through the sheath to the stone and the stone was then engaged with a 200 m tract tip laser fiber with the power setting of 0.3 J and 20 Hz.  The stone was broken into small and manageable fragments which were sequentially removed with the engage basket as the fragmentation progressed.  Toward the end of the fragmentation the rate was increased to 50 Hz to aid dusting.  Once the ureteral stone had been fragmented and removed, inspection revealed no significant ureteral injury and it was felt that it was safe to proceed with the renal stone treatment.  The access sheath was removed after a guidewire was replaced to the kidney and the assembled sheath was then reinserted over the wire to the UPJ.  The inner core and working wire were removed  once again leaving the safety wire in place.  The ureteroscope was then used to locate the lower pole stone after flushing the collecting system to try to reduce some of the bloody urine.  The stone was then treated at 0.3 J and 50 Hz and was broken into small fragments and dust.  The bulk of the fragments were then removed with the engage basket.  A large fragment was noted in the renal pelvis and was relocated back to  the lower pole where it was also fragmented with the larger fragments removed.  Further inspection revealed a pocket of fragments in the midpole calyx and these were fragmented with the larger fragments were removed.  Fluoroscopy was used throughout the procedure to assess progress.  Once all large fragments were removed and fluoroscopy revealed only hazy dust the basket was removed and the collecting system was reinspected under higher flow and no significant residual fragments of any size were noted however there was a fair amount of grit and dust that remained it will require passage.  Ureteroscope and access sheath were removed and the cystoscope was reinserted over the wire.  A fresh 6 French by 26 cm contour double-J stent was inserted over the wire under fluoroscopic guidance.  The wire was removed, leaving a good coil in the kidney and a good coil in the bladder.  The bladder was drained and the cystoscope was removed.  The patient was taken down from lithotomy position, his anesthetic was reversed and he was moved to recovery room in stable condition.  There were no complications.  The stone fragments will be taken to the office for analysis.

## 2018-08-02 ENCOUNTER — Inpatient Hospital Stay (HOSPITAL_BASED_OUTPATIENT_CLINIC_OR_DEPARTMENT_OTHER): Payer: PPO | Admitting: Nurse Practitioner

## 2018-08-02 ENCOUNTER — Inpatient Hospital Stay: Payer: PPO

## 2018-08-02 ENCOUNTER — Encounter (HOSPITAL_COMMUNITY): Payer: Self-pay | Admitting: Urology

## 2018-08-02 ENCOUNTER — Other Ambulatory Visit: Payer: Self-pay | Admitting: Nurse Practitioner

## 2018-08-02 ENCOUNTER — Other Ambulatory Visit: Payer: Self-pay

## 2018-08-02 VITALS — BP 110/70 | HR 96 | Temp 98.0°F | Resp 18 | Ht 67.0 in | Wt 180.4 lb

## 2018-08-02 DIAGNOSIS — N289 Disorder of kidney and ureter, unspecified: Secondary | ICD-10-CM | POA: Diagnosis not present

## 2018-08-02 DIAGNOSIS — C3432 Malignant neoplasm of lower lobe, left bronchus or lung: Secondary | ICD-10-CM | POA: Diagnosis not present

## 2018-08-02 DIAGNOSIS — Z5111 Encounter for antineoplastic chemotherapy: Secondary | ICD-10-CM | POA: Diagnosis not present

## 2018-08-02 DIAGNOSIS — C20 Malignant neoplasm of rectum: Secondary | ICD-10-CM

## 2018-08-02 DIAGNOSIS — Z7901 Long term (current) use of anticoagulants: Secondary | ICD-10-CM | POA: Diagnosis not present

## 2018-08-02 DIAGNOSIS — I82C11 Acute embolism and thrombosis of right internal jugular vein: Secondary | ICD-10-CM | POA: Diagnosis not present

## 2018-08-02 DIAGNOSIS — Z95828 Presence of other vascular implants and grafts: Secondary | ICD-10-CM

## 2018-08-02 DIAGNOSIS — J8410 Pulmonary fibrosis, unspecified: Secondary | ICD-10-CM

## 2018-08-02 LAB — CBC WITH DIFFERENTIAL (CANCER CENTER ONLY)
Abs Immature Granulocytes: 0.02 10*3/uL (ref 0.00–0.07)
Basophils Absolute: 0 10*3/uL (ref 0.0–0.1)
Basophils Relative: 0 %
Eosinophils Absolute: 0 10*3/uL (ref 0.0–0.5)
Eosinophils Relative: 0 %
HCT: 33.3 % — ABNORMAL LOW (ref 39.0–52.0)
Hemoglobin: 10.9 g/dL — ABNORMAL LOW (ref 13.0–17.0)
Immature Granulocytes: 0 %
Lymphocytes Relative: 9 %
Lymphs Abs: 0.7 10*3/uL (ref 0.7–4.0)
MCH: 34.4 pg — ABNORMAL HIGH (ref 26.0–34.0)
MCHC: 32.7 g/dL (ref 30.0–36.0)
MCV: 105 fL — ABNORMAL HIGH (ref 80.0–100.0)
Monocytes Absolute: 0.5 10*3/uL (ref 0.1–1.0)
Monocytes Relative: 7 %
Neutro Abs: 6 10*3/uL (ref 1.7–7.7)
Neutrophils Relative %: 84 %
Platelet Count: 145 10*3/uL — ABNORMAL LOW (ref 150–400)
RBC: 3.17 MIL/uL — ABNORMAL LOW (ref 4.22–5.81)
RDW: 16.6 % — ABNORMAL HIGH (ref 11.5–15.5)
WBC Count: 7.2 10*3/uL (ref 4.0–10.5)
nRBC: 0 % (ref 0.0–0.2)

## 2018-08-02 LAB — CMP (CANCER CENTER ONLY)
ALT: 12 U/L (ref 0–44)
AST: 21 U/L (ref 15–41)
Albumin: 2.9 g/dL — ABNORMAL LOW (ref 3.5–5.0)
Alkaline Phosphatase: 54 U/L (ref 38–126)
Anion gap: 8 (ref 5–15)
BUN: 18 mg/dL (ref 8–23)
CO2: 23 mmol/L (ref 22–32)
Calcium: 8.6 mg/dL — ABNORMAL LOW (ref 8.9–10.3)
Chloride: 108 mmol/L (ref 98–111)
Creatinine: 1.43 mg/dL — ABNORMAL HIGH (ref 0.61–1.24)
GFR, Est AFR Am: 57 mL/min — ABNORMAL LOW (ref >60)
GFR, Estimated: 49 mL/min — ABNORMAL LOW (ref >60)
Glucose, Bld: 150 mg/dL — ABNORMAL HIGH (ref 70–99)
Potassium: 4 mmol/L (ref 3.5–5.1)
Sodium: 139 mmol/L (ref 135–145)
Total Bilirubin: 0.4 mg/dL (ref 0.3–1.2)
Total Protein: 6.6 g/dL (ref 6.5–8.1)

## 2018-08-02 MED ORDER — HEPARIN SOD (PORK) LOCK FLUSH 100 UNIT/ML IV SOLN
500.0000 [IU] | Freq: Once | INTRAVENOUS | Status: AC
  Administered 2018-08-02: 500 [IU]
  Filled 2018-08-02: qty 5

## 2018-08-02 MED ORDER — SODIUM CHLORIDE 0.9% FLUSH
10.0000 mL | Freq: Once | INTRAVENOUS | Status: AC
  Administered 2018-08-02: 10 mL
  Filled 2018-08-02: qty 10

## 2018-08-02 MED ORDER — DEXAMETHASONE 4 MG PO TABS: ORAL_TABLET | ORAL | 0 refills | Status: DC

## 2018-08-02 NOTE — Progress Notes (Signed)
[place] OFFICE PROGRESS NOTE   Diagnosis: Rectal cancer  INTERVAL HISTORY:   Mr. Delorenzo returns as scheduled.  He is overall feeling well.  Bowels moving regularly.  No bladder pain with bowel movements.  He denies shortness of breath.  He has a good appetite.  No fever.  Objective:  Vital signs in last 24 hours:  Blood pressure 110/70, pulse 96, temperature 98 F (36.7 C), temperature source Oral, resp. rate 18, height '5\' 7"'  (1.702 m), weight 180 lb 6.4 oz (81.8 kg), SpO2 92 %.    HEENT: No thrush or ulcers. GI: Abdomen soft and nontender.  No hepatomegaly. Vascular: No leg edema. Skin: Palms without erythema. Port-A-Cath without erythema.   Lab Results:  Lab Results  Component Value Date   WBC 7.2 08/02/2018   HGB 10.9 (L) 08/02/2018   HCT 33.3 (L) 08/02/2018   MCV 105.0 (H) 08/02/2018   PLT 145 (L) 08/02/2018   NEUTROABS 6.0 08/02/2018    Imaging:  Dg C-arm 1-60 Min-no Report  Result Date: 08/01/2018 Fluoroscopy was utilized by the requesting physician.  No radiographic interpretation.    Medications: I have reviewed the patient's current medications.  Assessment/Plan: 1. Rectal cancer ? Distal rectal mass noted on colonoscopy 01/21/2018, biopsy confirmed invasive adenocarcinoma, cytokeratin 20 (patchy) and CDX 2+, cytokeratin 7 and TTF-1 negative. PIK3CA, APC, TP53 detected. ? CTs 01/29/2018-new left infrahilar mass, enlarged left hilar lymph node, mild subcarinal lymphadenopathy, confluent ill-defined lesion in area fibrosis at the posterior left lower lobe measuring 2 x 1.5 cm, rectal mass, perirectal lymph nodes ? MRI 01/28/2018-[id number] located at 6.6 cm from the internal anal sphincter, 2 mesorectal lymph nodes ? Hypermetabolic left infrahilar mass. Hypermetabolic nodule left lower lobe. Focal hypermetabolic activity within the rectum. ? Bronchoscopy 02/07/2018-no endobronchial lesions or abnormal secretions seen. Transbronchial  brushings obtained from the left lower lobe nodule. Significant enlargement of the station 7, station 10 L, station 11 L nodes. Needle biopsies were taken from station 7 and 11 L nodes. ? Pathology from the left lower lobe brushings, a level 7 lymph node returned positive for adenocarcinoma, positive for cytokeratin 7 and CDX2, negative for cytokeratin 20 and TTF-1. Fine-needle aspiration EBUS, 7, B with malignant cells consistent with adenocarcinoma; CCND1 (amp) detected. ? Cycle 1 FOLFOX 02/21/2018 ? Cycle 2 FOLFOX 03/13/2018,Udenyca added ? Cycle 3 FOLFOX 03/28/2018 ? Cycle 4 FOLFOX 04/18/2018 (oxaliplatin held) ? CT chest 04/19/2018-left infrahilar node previously measured 2.6 x 3.4, current 1.8 x 2.0. 11 mm paraesophageal lymph node, previously 14 mm. ? Cycle 5 FOLFOX 05/02/2018 ? Cycle 6 FOLFOX 05/16/2018 ? CT chest, abdomen, and pelvis on 05/28/2018-stable left lower lobe nodule, decreased left infrahilar mediastinal adenopathy, suspicion of right jugular vein thrombus, decreased perirectal lymph node, stable rectal wall thickening, new right ureteropelvic and distal right ureter stones with hydroureteronephrosis ? Radiation/Xeloda 06/10/2018- 07/18/2018 2. Pulmonary fibrosis 3. Kidney stones-new stones with right hydroureteronephrosis on CT 05/27/2018 4. BPH 5. History of gastroesophageal reflux 6. Mild renal insufficiency 7. Port-A-Cath placement 02/14/2018 8. Neutropenia and thrombocytopenia secondary to chemotherapy, Udenyca added with cycle 2 and oxaliplatin dose reduced 9. Tachycardia-chest CT 04/19/2018 negative for pulmonary embolus; underlying fibrotic lung disease again noted; slight increase patchy groundglass attenuation right upper lobe. Echocardiogram 04/23/2018-calculated EF 64%. Tachycardia improved 05/02/2018 10. Cystoscopy/right ureteroscopic stone extraction 06/10/2018 11. 06/04/2018 right upper extremity venous Doppler- acute deep vein thrombosis involving the right internal jugular  veins.  Eliquis initiated 12. Non-small cell lung cancer- hypermetabolic left infrahilar mass, and  left lower lobe nodule  Pathology from the left lower lobe brushings, a level 7 lymph node returned positive for adenocarcinoma, positive for cytokeratin 7 and CDX2, negative for cytokeratin 20 and TTF-1. Fine-needle aspiration EBUS, 7, B with malignant cells consistent with adenocarcinoma; CCND1 (amp) detected.    Disposition: Mr. Babington appears stable.  He completed the course of neoadjuvant Xeloda/radiation 07/18/2018.  He is tentatively scheduled to begin concurrent weekly Taxol/carboplatin and chest radiation 08/05/2018.  He was seen by Dr. Ronita Hipps at [facility] earlier this week.  The office note is not available.  He is scheduled to see Dr. Maryjane Hurter, thoracic surgeon, 08/13/2018.  We will try to contact them today for clarification on the treatment plan.  We will see Mr. Quesinberry in follow-up 08/12/2018.  He will contact the office in the interim with any problems.  Patient seen with Dr. Benay Spice.    [name] ANP/GNP-BC   08/02/2018  11:10 AM This was a shared visit with [name].  I discussed the case with Dr. Maryjane Hurter today.  He does not feel Mr. Minahan is a lung cancer surgical candidate.  He agrees with proceeding with chemotherapy and radiation.  The plan is to begin concurrent Taxol/carboplatin and radiation on 08/05/2018.  Julieanne Manson, MD

## 2018-08-05 ENCOUNTER — Inpatient Hospital Stay: Payer: PPO

## 2018-08-05 ENCOUNTER — Other Ambulatory Visit: Payer: PPO

## 2018-08-05 ENCOUNTER — Ambulatory Visit
Admission: RE | Admit: 2018-08-05 | Discharge: 2018-08-05 | Disposition: A | Discharge status: Home / Self Care | Payer: PPO | Source: Ambulatory Visit | Attending: Radiation Oncology | Admitting: Radiation Oncology

## 2018-08-05 ENCOUNTER — Other Ambulatory Visit: Payer: Self-pay

## 2018-08-05 VITALS — BP 107/77 | HR 88 | Temp 97.9°F | Resp 18

## 2018-08-05 DIAGNOSIS — C3432 Malignant neoplasm of lower lobe, left bronchus or lung: Secondary | ICD-10-CM

## 2018-08-05 DIAGNOSIS — Z510 Encounter for antineoplastic radiation therapy: Secondary | ICD-10-CM | POA: Diagnosis not present

## 2018-08-05 DIAGNOSIS — Z5111 Encounter for antineoplastic chemotherapy: Secondary | ICD-10-CM | POA: Diagnosis not present

## 2018-08-05 MED ORDER — SODIUM CHLORIDE 0.9 % IV SOLN
45.0000 mg/m2 | Freq: Once | INTRAVENOUS | Status: AC
  Administered 2018-08-05: 90 mg via INTRAVENOUS
  Filled 2018-08-05: qty 15

## 2018-08-05 MED ORDER — FAMOTIDINE IN NACL 20-0.9 MG/50ML-% IV SOLN
20.0000 mg | Freq: Once | INTRAVENOUS | Status: AC
  Administered 2018-08-05: 20 mg via INTRAVENOUS

## 2018-08-05 MED ORDER — DIPHENHYDRAMINE HCL 50 MG/ML IJ SOLN
25.0000 mg | Freq: Once | INTRAMUSCULAR | Status: AC
  Administered 2018-08-05: 25 mg via INTRAVENOUS

## 2018-08-05 MED ORDER — HEPARIN SOD (PORK) LOCK FLUSH 100 UNIT/ML IV SOLN
500.0000 [IU] | Freq: Once | INTRAVENOUS | Status: AC | PRN
  Administered 2018-08-05: 500 [IU]
  Filled 2018-08-05: qty 5

## 2018-08-05 MED ORDER — SODIUM CHLORIDE 0.9 % IV SOLN
157.8000 mg | Freq: Once | INTRAVENOUS | Status: AC
  Administered 2018-08-05: 160 mg via INTRAVENOUS
  Filled 2018-08-05: qty 16

## 2018-08-05 MED ORDER — SODIUM CHLORIDE 0.9% FLUSH
10.0000 mL | INTRAVENOUS | Status: DC | PRN
  Administered 2018-08-05: 13:00:00 10 mL
  Filled 2018-08-05: qty 10

## 2018-08-05 MED ORDER — PALONOSETRON HCL INJECTION 0.25 MG/5ML
0.2500 mg | Freq: Once | INTRAVENOUS | Status: AC
  Administered 2018-08-05: 0.25 mg via INTRAVENOUS

## 2018-08-05 MED ORDER — SODIUM CHLORIDE 0.9 % IV SOLN
20.0000 mg | Freq: Once | INTRAVENOUS | Status: AC
  Administered 2018-08-05: 09:00:00 20 mg via INTRAVENOUS
  Filled 2018-08-05: qty 20

## 2018-08-05 MED ORDER — PALONOSETRON HCL INJECTION 0.25 MG/5ML
INTRAVENOUS | Status: AC
  Filled 2018-08-05: qty 5

## 2018-08-05 MED ORDER — SODIUM CHLORIDE 0.9 % IV SOLN
Freq: Once | INTRAVENOUS | Status: AC
  Administered 2018-08-05: 08:00:00 via INTRAVENOUS
  Filled 2018-08-05: qty 250

## 2018-08-05 MED ORDER — DIPHENHYDRAMINE HCL 50 MG/ML IJ SOLN
INTRAMUSCULAR | Status: AC
  Filled 2018-08-05: qty 1

## 2018-08-05 MED ORDER — FAMOTIDINE IN NACL 20-0.9 MG/50ML-% IV SOLN
INTRAVENOUS | Status: AC
  Filled 2018-08-05: qty 50

## 2018-08-05 NOTE — Patient Instructions (Signed)
Carboplatin injection What is this medicine? CARBOPLATIN (KAR boe pla tin) is a chemotherapy drug. It targets fast dividing cells, like cancer cells, and causes these cells to die. This medicine is used to treat ovarian cancer and many other cancers. This medicine may be used for other purposes; ask your health care provider or pharmacist if you have questions. COMMON BRAND NAME(S): Paraplatin What should I tell my health care provider before I take this medicine? They need to know if you have any of these conditions:  blood disorders  hearing problems  kidney disease  recent or ongoing radiation therapy  an unusual or allergic reaction to carboplatin, cisplatin, other chemotherapy, other medicines, foods, dyes, or preservatives  pregnant or trying to get pregnant  breast-feeding How should I use this medicine? This drug is usually given as an infusion into a vein. It is administered in a hospital or clinic by a specially trained health care professional. Talk to your pediatrician regarding the use of this medicine in children. Special care may be needed. Overdosage: If you think you have taken too much of this medicine contact a poison control center or emergency room at once. NOTE: This medicine is only for you. Do not share this medicine with others. What if I miss a dose? It is important not to miss a dose. Call your doctor or health care professional if you are unable to keep an appointment. What may interact with this medicine?  medicines for seizures  medicines to increase blood counts like filgrastim, pegfilgrastim, sargramostim  some antibiotics like amikacin, gentamicin, neomycin, streptomycin, tobramycin  vaccines Talk to your doctor or health care professional before taking any of these medicines:  acetaminophen  aspirin  ibuprofen  ketoprofen  naproxen This list may not describe all possible interactions. Give your health care provider a list of all the  medicines, herbs, non-prescription drugs, or dietary supplements you use. Also tell them if you smoke, drink alcohol, or use illegal drugs. Some items may interact with your medicine. What should I watch for while using this medicine? Your condition will be monitored carefully while you are receiving this medicine. You will need important blood work done while you are taking this medicine. This drug may make you feel generally unwell. This is not uncommon, as chemotherapy can affect healthy cells as well as cancer cells. Report any side effects. Continue your course of treatment even though you feel ill unless your doctor tells you to stop. In some cases, you may be given additional medicines to help with side effects. Follow all directions for their use. Call your doctor or health care professional for advice if you get a fever, chills or sore throat, or other symptoms of a cold or flu. Do not treat yourself. This drug decreases your body's ability to fight infections. Try to avoid being around people who are sick. This medicine may increase your risk to bruise or bleed. Call your doctor or health care professional if you notice any unusual bleeding. Be careful brushing and flossing your teeth or using a toothpick because you may get an infection or bleed more easily. If you have any dental work done, tell your dentist you are receiving this medicine. Avoid taking products that contain aspirin, acetaminophen, ibuprofen, naproxen, or ketoprofen unless instructed by your doctor. These medicines may hide a fever. Do not become pregnant while taking this medicine. Women should inform their doctor if they wish to become pregnant or think they might be pregnant. There is a  potential for serious side effects to an unborn child. Talk to your health care professional or pharmacist for more information. Do not breast-feed an infant while taking this medicine. What side effects may I notice from receiving this  medicine? Side effects that you should report to your doctor or health care professional as soon as possible:  allergic reactions like skin rash, itching or hives, swelling of the face, lips, or tongue  signs of infection - fever or chills, cough, sore throat, pain or difficulty passing urine  signs of decreased platelets or bleeding - bruising, pinpoint red spots on the skin, black, tarry stools, nosebleeds  signs of decreased red blood cells - unusually weak or tired, fainting spells, lightheadedness  breathing problems  changes in hearing  changes in vision  chest pain  high blood pressure  low blood counts - This drug may decrease the number of white blood cells, red blood cells and platelets. You may be at increased risk for infections and bleeding.  nausea and vomiting  pain, swelling, redness or irritation at the injection site  pain, tingling, numbness in the hands or feet  problems with balance, talking, walking  trouble passing urine or change in the amount of urine Side effects that usually do not require medical attention (report to your doctor or health care professional if they continue or are bothersome):  hair loss  loss of appetite  metallic taste in the mouth or changes in taste This list may not describe all possible side effects. Call your doctor for medical advice about side effects. You may report side effects to FDA at 1-800-FDA-1088. Where should I keep my medicine? This drug is given in a hospital or clinic and will not be stored at home. NOTE: This sheet is a summary. It may not cover all possible information. If you have questions about this medicine, talk to your doctor, pharmacist, or health care provider.  2020 Elsevier/Gold Standard (2007-04-02 14:38:05) Paclitaxel injection What is this medicine? PACLITAXEL (PAK li TAX el) is a chemotherapy drug. It targets fast dividing cells, like cancer cells, and causes these cells to die. This  medicine is used to treat ovarian cancer, breast cancer, lung cancer, Kaposi's sarcoma, and other cancers. This medicine may be used for other purposes; ask your health care provider or pharmacist if you have questions. COMMON BRAND NAME(S): Onxol, Taxol What should I tell my health care provider before I take this medicine? They need to know if you have any of these conditions:  history of irregular heartbeat  liver disease  low blood counts, like low white cell, platelet, or red cell counts  lung or breathing disease, like asthma  tingling of the fingers or toes, or other nerve disorder  an unusual or allergic reaction to paclitaxel, alcohol, polyoxyethylated castor oil, other chemotherapy, other medicines, foods, dyes, or preservatives  pregnant or trying to get pregnant  breast-feeding How should I use this medicine? This drug is given as an infusion into a vein. It is administered in a hospital or clinic by a specially trained health care professional. Talk to your pediatrician regarding the use of this medicine in children. Special care may be needed. Overdosage: If you think you have taken too much of this medicine contact a poison control center or emergency room at once. NOTE: This medicine is only for you. Do not share this medicine with others. What if I miss a dose? It is important not to miss your dose. Call your doctor or  health care professional if you are unable to keep an appointment. What may interact with this medicine? Do not take this medicine with any of the following medications:  disulfiram  metronidazole This medicine may also interact with the following medications:  antiviral medicines for hepatitis, HIV or AIDS  certain antibiotics like erythromycin and clarithromycin  certain medicines for fungal infections like ketoconazole and itraconazole  certain medicines for seizures like carbamazepine, phenobarbital,  phenytoin  gemfibrozil  nefazodone  rifampin  St. John's wort This list may not describe all possible interactions. Give your health care provider a list of all the medicines, herbs, non-prescription drugs, or dietary supplements you use. Also tell them if you smoke, drink alcohol, or use illegal drugs. Some items may interact with your medicine. What should I watch for while using this medicine? Your condition will be monitored carefully while you are receiving this medicine. You will need important blood work done while you are taking this medicine. This medicine can cause serious allergic reactions. To reduce your risk you will need to take other medicine(s) before treatment with this medicine. If you experience allergic reactions like skin rash, itching or hives, swelling of the face, lips, or tongue, tell your doctor or health care professional right away. In some cases, you may be given additional medicines to help with side effects. Follow all directions for their use. This drug may make you feel generally unwell. This is not uncommon, as chemotherapy can affect healthy cells as well as cancer cells. Report any side effects. Continue your course of treatment even though you feel ill unless your doctor tells you to stop. Call your doctor or health care professional for advice if you get a fever, chills or sore throat, or other symptoms of a cold or flu. Do not treat yourself. This drug decreases your body's ability to fight infections. Try to avoid being around people who are sick. This medicine may increase your risk to bruise or bleed. Call your doctor or health care professional if you notice any unusual bleeding. Be careful brushing and flossing your teeth or using a toothpick because you may get an infection or bleed more easily. If you have any dental work done, tell your dentist you are receiving this medicine. Avoid taking products that contain aspirin, acetaminophen, ibuprofen,  naproxen, or ketoprofen unless instructed by your doctor. These medicines may hide a fever. Do not become pregnant while taking this medicine. Women should inform their doctor if they wish to become pregnant or think they might be pregnant. There is a potential for serious side effects to an unborn child. Talk to your health care professional or pharmacist for more information. Do not breast-feed an infant while taking this medicine. Men are advised not to father a child while receiving this medicine. This product may contain alcohol. Ask your pharmacist or healthcare provider if this medicine contains alcohol. Be sure to tell all healthcare providers you are taking this medicine. Certain medicines, like metronidazole and disulfiram, can cause an unpleasant reaction when taken with alcohol. The reaction includes flushing, headache, nausea, vomiting, sweating, and increased thirst. The reaction can last from 30 minutes to several hours. What side effects may I notice from receiving this medicine? Side effects that you should report to your doctor or health care professional as soon as possible:  allergic reactions like skin rash, itching or hives, swelling of the face, lips, or tongue  breathing problems  changes in vision  fast, irregular heartbeat  high or  low blood pressure  mouth sores  pain, tingling, numbness in the hands or feet  signs of decreased platelets or bleeding - bruising, pinpoint red spots on the skin, black, tarry stools, blood in the urine  signs of decreased red blood cells - unusually weak or tired, feeling faint or lightheaded, falls  signs of infection - fever or chills, cough, sore throat, pain or difficulty passing urine  signs and symptoms of liver injury like dark yellow or brown urine; general ill feeling or flu-like symptoms; light-colored stools; loss of appetite; nausea; right upper belly pain; unusually weak or tired; yellowing of the eyes or  skin  swelling of the ankles, feet, hands  unusually slow heartbeat Side effects that usually do not require medical attention (report to your doctor or health care professional if they continue or are bothersome):  diarrhea  hair loss  loss of appetite  muscle or joint pain  nausea, vomiting  pain, redness, or irritation at site where injected  tiredness This list may not describe all possible side effects. Call your doctor for medical advice about side effects. You may report side effects to FDA at 1-800-FDA-1088. Where should I keep my medicine? This drug is given in a hospital or clinic and will not be stored at home. NOTE: This sheet is a summary. It may not cover all possible information. If you have questions about this medicine, talk to your doctor, pharmacist, or health care provider.  2020 Elsevier/Gold Standard (2016-08-29 13:14:55)

## 2018-08-06 ENCOUNTER — Ambulatory Visit
Admission: RE | Admit: 2018-08-06 | Discharge: 2018-08-06 | Disposition: A | Discharge status: Home / Self Care | Payer: PPO | Source: Ambulatory Visit | Attending: Radiation Oncology | Admitting: Radiation Oncology

## 2018-08-06 DIAGNOSIS — C3432 Malignant neoplasm of lower lobe, left bronchus or lung: Secondary | ICD-10-CM | POA: Diagnosis not present

## 2018-08-06 DIAGNOSIS — Z510 Encounter for antineoplastic radiation therapy: Secondary | ICD-10-CM | POA: Diagnosis not present

## 2018-08-07 ENCOUNTER — Other Ambulatory Visit: Payer: Self-pay

## 2018-08-07 ENCOUNTER — Ambulatory Visit
Admission: RE | Admit: 2018-08-07 | Discharge: 2018-08-07 | Disposition: A | Discharge status: Home / Self Care | Payer: PPO | Source: Ambulatory Visit | Attending: Radiation Oncology | Admitting: Radiation Oncology

## 2018-08-07 DIAGNOSIS — Z510 Encounter for antineoplastic radiation therapy: Secondary | ICD-10-CM | POA: Diagnosis not present

## 2018-08-07 DIAGNOSIS — C3432 Malignant neoplasm of lower lobe, left bronchus or lung: Secondary | ICD-10-CM | POA: Diagnosis not present

## 2018-08-08 ENCOUNTER — Other Ambulatory Visit: Payer: Self-pay

## 2018-08-08 ENCOUNTER — Ambulatory Visit
Admission: RE | Admit: 2018-08-08 | Discharge: 2018-08-08 | Disposition: A | Discharge status: Home / Self Care | Payer: PPO | Source: Ambulatory Visit | Attending: Radiation Oncology | Admitting: Radiation Oncology

## 2018-08-08 ENCOUNTER — Telehealth: Payer: Self-pay | Admitting: Oncology

## 2018-08-08 DIAGNOSIS — C3432 Malignant neoplasm of lower lobe, left bronchus or lung: Secondary | ICD-10-CM | POA: Diagnosis not present

## 2018-08-08 DIAGNOSIS — Z510 Encounter for antineoplastic radiation therapy: Secondary | ICD-10-CM | POA: Diagnosis not present

## 2018-08-08 DIAGNOSIS — N202 Calculus of kidney with calculus of ureter: Secondary | ICD-10-CM | POA: Diagnosis not present

## 2018-08-08 NOTE — Telephone Encounter (Signed)
Called and spoke with patient. Confirmed date and time of 8/3 appt

## 2018-08-09 ENCOUNTER — Ambulatory Visit
Admission: RE | Admit: 2018-08-09 | Discharge: 2018-08-09 | Disposition: A | Discharge status: Home / Self Care | Payer: PPO | Source: Ambulatory Visit | Attending: Radiation Oncology | Admitting: Radiation Oncology

## 2018-08-09 ENCOUNTER — Other Ambulatory Visit: Payer: Self-pay

## 2018-08-09 DIAGNOSIS — C3432 Malignant neoplasm of lower lobe, left bronchus or lung: Secondary | ICD-10-CM

## 2018-08-09 DIAGNOSIS — Z510 Encounter for antineoplastic radiation therapy: Secondary | ICD-10-CM | POA: Diagnosis not present

## 2018-08-09 MED ORDER — SONAFINE EX EMUL
1.0000 "application " | Freq: Once | CUTANEOUS | Status: AC
  Administered 2018-08-09: 1 via TOPICAL

## 2018-08-09 NOTE — Progress Notes (Signed)
Pt here for patient teaching.  Pt given Radiation and You booklet and Sonafine.  Reviewed areas of pertinence such as fatigue, hair loss, skin changes, throat changes, cough and shortness of breath . Pt able to give teach back of to pat skin, use unscented/gentle soap and drink plenty of water,apply Sonafine bid and avoid applying anything to skin within 4 hours of treatment. Pt verbalizes understanding of information given and will contact nursing with any questions or concerns.    Gloriajean Dell. Leonie Green, BSN

## 2018-08-11 ENCOUNTER — Other Ambulatory Visit: Payer: Self-pay | Admitting: Oncology

## 2018-08-12 ENCOUNTER — Inpatient Hospital Stay: Payer: PPO | Attending: Oncology | Admitting: Oncology

## 2018-08-12 ENCOUNTER — Ambulatory Visit: Payer: PPO

## 2018-08-12 ENCOUNTER — Other Ambulatory Visit: Payer: PPO

## 2018-08-12 ENCOUNTER — Inpatient Hospital Stay: Payer: PPO

## 2018-08-12 ENCOUNTER — Other Ambulatory Visit: Payer: Self-pay

## 2018-08-12 ENCOUNTER — Other Ambulatory Visit: Payer: Self-pay | Admitting: *Deleted

## 2018-08-12 ENCOUNTER — Ambulatory Visit
Admission: RE | Admit: 2018-08-12 | Discharge: 2018-08-12 | Disposition: A | Discharge status: Home / Self Care | Payer: PPO | Source: Ambulatory Visit | Attending: Radiation Oncology | Admitting: Radiation Oncology

## 2018-08-12 VITALS — BP 103/62 | HR 100 | Temp 98.0°F | Resp 18 | Ht 67.0 in | Wt 179.8 lb

## 2018-08-12 DIAGNOSIS — Z7901 Long term (current) use of anticoagulants: Secondary | ICD-10-CM | POA: Diagnosis not present

## 2018-08-12 DIAGNOSIS — D6959 Other secondary thrombocytopenia: Secondary | ICD-10-CM | POA: Insufficient documentation

## 2018-08-12 DIAGNOSIS — Z510 Encounter for antineoplastic radiation therapy: Secondary | ICD-10-CM | POA: Diagnosis not present

## 2018-08-12 DIAGNOSIS — K219 Gastro-esophageal reflux disease without esophagitis: Secondary | ICD-10-CM | POA: Insufficient documentation

## 2018-08-12 DIAGNOSIS — J8410 Pulmonary fibrosis, unspecified: Secondary | ICD-10-CM | POA: Insufficient documentation

## 2018-08-12 DIAGNOSIS — N201 Calculus of ureter: Secondary | ICD-10-CM | POA: Insufficient documentation

## 2018-08-12 DIAGNOSIS — N1330 Unspecified hydronephrosis: Secondary | ICD-10-CM | POA: Diagnosis not present

## 2018-08-12 DIAGNOSIS — C3432 Malignant neoplasm of lower lobe, left bronchus or lung: Secondary | ICD-10-CM

## 2018-08-12 DIAGNOSIS — D701 Agranulocytosis secondary to cancer chemotherapy: Secondary | ICD-10-CM | POA: Diagnosis not present

## 2018-08-12 DIAGNOSIS — C20 Malignant neoplasm of rectum: Secondary | ICD-10-CM

## 2018-08-12 DIAGNOSIS — I82C11 Acute embolism and thrombosis of right internal jugular vein: Secondary | ICD-10-CM | POA: Diagnosis not present

## 2018-08-12 DIAGNOSIS — N289 Disorder of kidney and ureter, unspecified: Secondary | ICD-10-CM | POA: Insufficient documentation

## 2018-08-12 DIAGNOSIS — T451X5A Adverse effect of antineoplastic and immunosuppressive drugs, initial encounter: Secondary | ICD-10-CM | POA: Diagnosis not present

## 2018-08-12 DIAGNOSIS — I959 Hypotension, unspecified: Secondary | ICD-10-CM | POA: Diagnosis not present

## 2018-08-12 DIAGNOSIS — Z79899 Other long term (current) drug therapy: Secondary | ICD-10-CM | POA: Insufficient documentation

## 2018-08-12 DIAGNOSIS — R000 Tachycardia, unspecified: Secondary | ICD-10-CM | POA: Diagnosis not present

## 2018-08-12 DIAGNOSIS — Z5111 Encounter for antineoplastic chemotherapy: Secondary | ICD-10-CM | POA: Diagnosis not present

## 2018-08-12 DIAGNOSIS — Z95828 Presence of other vascular implants and grafts: Secondary | ICD-10-CM

## 2018-08-12 LAB — CBC WITH DIFFERENTIAL (CANCER CENTER ONLY)
Abs Immature Granulocytes: 0.04 10*3/uL (ref 0.00–0.07)
Basophils Absolute: 0 10*3/uL (ref 0.0–0.1)
Basophils Relative: 1 %
Eosinophils Absolute: 0.1 10*3/uL (ref 0.0–0.5)
Eosinophils Relative: 2 %
HCT: 34.8 % — ABNORMAL LOW (ref 39.0–52.0)
Hemoglobin: 11.7 g/dL — ABNORMAL LOW (ref 13.0–17.0)
Immature Granulocytes: 1 %
Lymphocytes Relative: 11 %
Lymphs Abs: 0.4 10*3/uL — ABNORMAL LOW (ref 0.7–4.0)
MCH: 34.4 pg — ABNORMAL HIGH (ref 26.0–34.0)
MCHC: 33.6 g/dL (ref 30.0–36.0)
MCV: 102.4 fL — ABNORMAL HIGH (ref 80.0–100.0)
Monocytes Absolute: 0.1 10*3/uL (ref 0.1–1.0)
Monocytes Relative: 4 %
Neutro Abs: 3 10*3/uL (ref 1.7–7.7)
Neutrophils Relative %: 81 %
Platelet Count: 154 10*3/uL (ref 150–400)
RBC: 3.4 MIL/uL — ABNORMAL LOW (ref 4.22–5.81)
RDW: 14.6 % (ref 11.5–15.5)
WBC Count: 3.7 10*3/uL — ABNORMAL LOW (ref 4.0–10.5)
nRBC: 0 % (ref 0.0–0.2)

## 2018-08-12 LAB — CMP (CANCER CENTER ONLY)
ALT: 16 U/L (ref 0–44)
AST: 20 U/L (ref 15–41)
Albumin: 3.1 g/dL — ABNORMAL LOW (ref 3.5–5.0)
Alkaline Phosphatase: 54 U/L (ref 38–126)
Anion gap: 8 (ref 5–15)
BUN: 20 mg/dL (ref 8–23)
CO2: 25 mmol/L (ref 22–32)
Calcium: 8.9 mg/dL (ref 8.9–10.3)
Chloride: 104 mmol/L (ref 98–111)
Creatinine: 1.13 mg/dL (ref 0.61–1.24)
GFR, Est AFR Am: >60 mL/min (ref >60)
GFR, Estimated: >60 mL/min (ref >60)
Glucose, Bld: 150 mg/dL — ABNORMAL HIGH (ref 70–99)
Potassium: 4.2 mmol/L (ref 3.5–5.1)
Sodium: 137 mmol/L (ref 135–145)
Total Bilirubin: 0.4 mg/dL (ref 0.3–1.2)
Total Protein: 6.8 g/dL (ref 6.5–8.1)

## 2018-08-12 MED ORDER — DEXAMETHASONE SODIUM PHOSPHATE 10 MG/ML IJ SOLN
INTRAMUSCULAR | Status: AC
  Filled 2018-08-12: qty 1

## 2018-08-12 MED ORDER — HEPARIN SOD (PORK) LOCK FLUSH 100 UNIT/ML IV SOLN
500.0000 [IU] | Freq: Once | INTRAVENOUS | Status: AC | PRN
  Administered 2018-08-12: 500 [IU]
  Filled 2018-08-12: qty 5

## 2018-08-12 MED ORDER — DEXAMETHASONE SODIUM PHOSPHATE 10 MG/ML IJ SOLN
10.0000 mg | Freq: Once | INTRAMUSCULAR | Status: AC
  Administered 2018-08-12: 10 mg via INTRAVENOUS

## 2018-08-12 MED ORDER — SODIUM CHLORIDE 0.9 % IV SOLN: 10.0000 mg | Freq: Once | INTRAVENOUS | Status: DC

## 2018-08-12 MED ORDER — PALONOSETRON HCL INJECTION 0.25 MG/5ML
INTRAVENOUS | Status: AC
  Filled 2018-08-12: qty 5

## 2018-08-12 MED ORDER — FAMOTIDINE IN NACL 20-0.9 MG/50ML-% IV SOLN
20.0000 mg | Freq: Once | INTRAVENOUS | Status: AC
  Administered 2018-08-12: 20 mg via INTRAVENOUS

## 2018-08-12 MED ORDER — PALONOSETRON HCL INJECTION 0.25 MG/5ML
0.2500 mg | Freq: Once | INTRAVENOUS | Status: AC
  Administered 2018-08-12: 0.25 mg via INTRAVENOUS

## 2018-08-12 MED ORDER — SODIUM CHLORIDE 0.9% FLUSH
10.0000 mL | INTRAVENOUS | Status: DC | PRN
  Administered 2018-08-12: 13:00:00 10 mL
  Filled 2018-08-12: qty 10

## 2018-08-12 MED ORDER — SODIUM CHLORIDE 0.9% FLUSH
10.0000 mL | Freq: Once | INTRAVENOUS | Status: AC
  Administered 2018-08-12: 09:00:00 10 mL
  Filled 2018-08-12: qty 10

## 2018-08-12 MED ORDER — FAMOTIDINE IN NACL 20-0.9 MG/50ML-% IV SOLN
INTRAVENOUS | Status: AC
  Filled 2018-08-12: qty 50

## 2018-08-12 MED ORDER — SODIUM CHLORIDE 0.9 % IV SOLN
160.0000 mg | Freq: Once | INTRAVENOUS | Status: AC
  Administered 2018-08-12: 160 mg via INTRAVENOUS
  Filled 2018-08-12: qty 16

## 2018-08-12 MED ORDER — DIPHENHYDRAMINE HCL 50 MG/ML IJ SOLN
INTRAMUSCULAR | Status: AC
  Filled 2018-08-12: qty 1

## 2018-08-12 MED ORDER — DIPHENHYDRAMINE HCL 50 MG/ML IJ SOLN
25.0000 mg | Freq: Once | INTRAMUSCULAR | Status: AC
  Administered 2018-08-12: 25 mg via INTRAVENOUS

## 2018-08-12 MED ORDER — SODIUM CHLORIDE 0.9 % IV SOLN
45.0000 mg/m2 | Freq: Once | INTRAVENOUS | Status: AC
  Administered 2018-08-12: 90 mg via INTRAVENOUS
  Filled 2018-08-12: qty 15

## 2018-08-12 MED ORDER — SODIUM CHLORIDE 0.9 % IV SOLN
Freq: Once | INTRAVENOUS | Status: AC
  Administered 2018-08-12: 10:00:00 via INTRAVENOUS
  Filled 2018-08-12: qty 250

## 2018-08-12 NOTE — Patient Instructions (Signed)
   [facility] Discharge Instructions for Patients Receiving Chemotherapy  Today you received the following chemotherapy agents Taxol and Carboplatin   To help prevent nausea and vomiting after your treatment, we encourage you to take your nausea medication as directed.    If you develop nausea and vomiting that is not controlled by your nausea medication, call the clinic.   BELOW ARE SYMPTOMS THAT SHOULD BE REPORTED IMMEDIATELY:  *FEVER GREATER THAN 100.5 F  *CHILLS WITH OR WITHOUT FEVER  NAUSEA AND VOMITING THAT IS NOT CONTROLLED WITH YOUR NAUSEA MEDICATION  *UNUSUAL SHORTNESS OF BREATH  *UNUSUAL BRUISING OR BLEEDING  TENDERNESS IN MOUTH AND THROAT WITH OR WITHOUT PRESENCE OF ULCERS  *URINARY PROBLEMS  *BOWEL PROBLEMS  UNUSUAL RASH Items with * indicate a potential emergency and should be followed up as soon as possible.  Feel free to call the clinic should you have any questions or concerns. The clinic phone number is [phone].  Please show the CHEMO ALERT CARD at check-in to the Emergency Department and triage nurse.   

## 2018-08-12 NOTE — Progress Notes (Signed)
[facility] OFFICE PROGRESS NOTE   Diagnosis: Rectal cancer, non-small cell lung cancer  INTERVAL HISTORY:   Austin Yu returns as scheduled.  He completed cycle 1 note Taxol/carboplatin on 08/05/2018.  No sign of allergic reaction.  No change in neuropathy symptoms.  Stable exertional dyspnea.  No difficulty with bowel movements.  Objective:  Vital signs in last 24 hours:  Blood pressure 103/62, pulse 100, temperature 98 F (36.7 C), temperature source Oral, resp. rate 18, height _0  (1.702 m), weight 179 lb 12.8 oz (81.6 kg), SpO2 95 %.    HEENT: No thrush Resp: End inspiratory rales at the posterior chest bilaterally, no respiratory distress Cardio: Regular rate and rhythm GI: No hepatomegaly, nontender Vascular: No leg edema   Portacath/PICC-without erythema  Lab Results:  Lab Results  Component Value Date   WBC 3.7 (L) 08/12/2018   HGB 11.7 (L) 08/12/2018   HCT 34.8 (L) 08/12/2018   MCV 102.4 (H) 08/12/2018   PLT 154 08/12/2018   NEUTROABS 3.0 08/12/2018    CMP  Lab Results  Component Value Date   NA 139 08/02/2018   K 4.0 08/02/2018   CL 108 08/02/2018   CO2 23 08/02/2018   GLUCOSE 150 (H) 08/02/2018   BUN 18 08/02/2018   CREATININE 1.43 (H) 08/02/2018   CALCIUM 8.6 (L) 08/02/2018   PROT 6.6 08/02/2018   ALBUMIN 2.9 (L) 08/02/2018   AST 21 08/02/2018   ALT 12 08/02/2018   ALKPHOS 54 08/02/2018   BILITOT 0.4 08/02/2018   GFRNONAA 49 (L) 08/02/2018   GFRAA 57 (L) 08/02/2018    Lab Results  Component Value Date   CEA1 17.61 (H) 07/17/2018     Medications: I have reviewed the patient's current medications.   Assessment/Plan:  1. Rectal cancer ? Distal rectal mass noted on colonoscopy 01/21/2018, biopsy confirmed invasive adenocarcinoma, cytokeratin 20 (patchy) and CDX 2+, cytokeratin 7 and TTF-1 negative. PIK3CA, APC, TP53 detected. ? CTs 01/29/2018-new left infrahilar mass, enlarged left hilar lymph node, mild subcarinal  lymphadenopathy, confluent ill-defined lesion in area fibrosis at the posterior left lower lobe measuring 2 x 1.5 cm, rectal mass, perirectal lymph nodes ? MRI 01/28/2018-[id number] located at 6.6 cm from the internal anal sphincter, 2 mesorectal lymph nodes ? Hypermetabolic left infrahilar mass. Hypermetabolic nodule left lower lobe. Focal hypermetabolic activity within the rectum. ? Bronchoscopy 02/07/2018-no endobronchial lesions or abnormal secretions seen. Transbronchial brushings obtained from the left lower lobe nodule. Significant enlargement of the station 7, station 10 L, station 11 L nodes. Needle biopsies were taken from station 7 and 11 L nodes. ? Pathology from the left lower lobe brushings, a level 7 lymph node returned positive for adenocarcinoma, positive for cytokeratin 7 and CDX2, negative for cytokeratin 20 and TTF-1. Fine-needle aspiration EBUS, 7, B with malignant cells consistent with adenocarcinoma; CCND1 (amp) detected. ? Cycle 1 FOLFOX 02/21/2018 ? Cycle 2 FOLFOX 03/13/2018,Udenyca added ? Cycle 3 FOLFOX 03/28/2018 ? Cycle 4 FOLFOX 04/18/2018 (oxaliplatin held) ? CT chest 04/19/2018-left infrahilar node previously measured 2.6 x 3.4, current 1.8 x 2.0. 11 mm paraesophageal lymph node, previously 14 mm. ? Cycle 5 FOLFOX 05/02/2018 ? Cycle 6 FOLFOX 05/16/2018 ? CT chest, abdomen, and pelvis on 05/28/2018-stable left lower lobe nodule, decreased left infrahilar mediastinal adenopathy, suspicion of right jugular vein thrombus, decreased perirectal lymph node, stable rectal wall thickening, new right ureteropelvic and distal right ureter stones with hydroureteronephrosis ? Radiation/Xeloda 06/10/2018- 07/18/2018 2. Pulmonary fibrosis 3. Kidney stones-new stones with right hydroureteronephrosis  on CT 05/27/2018 4. BPH 5. History of gastroesophageal reflux 6. Mild renal insufficiency 7. Port-A-Cath placement 02/14/2018 8. Neutropenia and thrombocytopenia secondary to chemotherapy,  Udenyca added with cycle 2 and oxaliplatin dose reduced 9. Tachycardia-chest CT 04/19/2018 negative for pulmonary embolus; underlying fibrotic lung disease again noted; slight increase patchy groundglass attenuation right upper lobe. Echocardiogram 04/23/2018-calculated EF 64%. Tachycardia improved 05/02/2018 10. Cystoscopy/right ureteroscopic stone extraction 06/10/2018 11. 06/04/2018 right upper extremity venous Doppler- acute deep vein thrombosis involving the right internal jugular veins.  Eliquis initiated 12. Non-small cell lung cancer- hypermetabolic left infrahilar mass, and left lower lobe nodule  Pathology from the left lower lobe brushings, a level 7 lymph node returned positive for adenocarcinoma, positive for cytokeratin 7 and CDX2, negative for cytokeratin 20 and TTF-1. Fine-needle aspiration EBUS, 7, B with malignant cells consistent with adenocarcinoma; CCND1 (amp) detected.  Weekly Taxol/carboplatin radiation beginning 08/05/2018   Disposition: Austin Yu appears unchanged.  He will complete week 2 Taxol/carboplatin today.  Continues daily chest radiation.  I discussed the case with Dr. Maryjane Hurter in thoracic surgeon at [facility] on 08/02/2018.  He does not recommend lung surgery.  Austin Yu will complete cycle 3 Taxol/carboplatin in 1 week.  He will be scheduled for an office visit on 08/23/2018.   Betsy Coder, MD  08/12/2018  9:33 AM

## 2018-08-12 NOTE — Progress Notes (Signed)
MD Learta Codding would like to keep Carboplatin dose the same as previous doses.   Larene Beach, PharmD

## 2018-08-13 ENCOUNTER — Ambulatory Visit
Admission: RE | Admit: 2018-08-13 | Discharge: 2018-08-13 | Disposition: A | Discharge status: Home / Self Care | Payer: PPO | Source: Ambulatory Visit | Attending: Radiation Oncology | Admitting: Radiation Oncology

## 2018-08-13 ENCOUNTER — Other Ambulatory Visit: Payer: Self-pay

## 2018-08-13 DIAGNOSIS — C3432 Malignant neoplasm of lower lobe, left bronchus or lung: Secondary | ICD-10-CM | POA: Diagnosis not present

## 2018-08-13 DIAGNOSIS — Z510 Encounter for antineoplastic radiation therapy: Secondary | ICD-10-CM | POA: Diagnosis not present

## 2018-08-14 ENCOUNTER — Ambulatory Visit
Admission: RE | Admit: 2018-08-14 | Discharge: 2018-08-14 | Disposition: A | Discharge status: Home / Self Care | Payer: PPO | Source: Ambulatory Visit | Attending: Radiation Oncology | Admitting: Radiation Oncology

## 2018-08-14 ENCOUNTER — Other Ambulatory Visit: Payer: Self-pay

## 2018-08-14 DIAGNOSIS — C3432 Malignant neoplasm of lower lobe, left bronchus or lung: Secondary | ICD-10-CM | POA: Diagnosis not present

## 2018-08-14 DIAGNOSIS — Z510 Encounter for antineoplastic radiation therapy: Secondary | ICD-10-CM | POA: Diagnosis not present

## 2018-08-15 ENCOUNTER — Other Ambulatory Visit: Payer: Self-pay

## 2018-08-15 ENCOUNTER — Ambulatory Visit
Admission: RE | Admit: 2018-08-15 | Discharge: 2018-08-15 | Disposition: A | Discharge status: Home / Self Care | Payer: PPO | Source: Ambulatory Visit | Attending: Radiation Oncology | Admitting: Radiation Oncology

## 2018-08-15 DIAGNOSIS — C3432 Malignant neoplasm of lower lobe, left bronchus or lung: Secondary | ICD-10-CM | POA: Diagnosis not present

## 2018-08-15 DIAGNOSIS — Z510 Encounter for antineoplastic radiation therapy: Secondary | ICD-10-CM | POA: Diagnosis not present

## 2018-08-16 ENCOUNTER — Other Ambulatory Visit: Payer: Self-pay

## 2018-08-16 ENCOUNTER — Ambulatory Visit
Admission: RE | Admit: 2018-08-16 | Discharge: 2018-08-16 | Disposition: A | Discharge status: Home / Self Care | Payer: PPO | Source: Ambulatory Visit | Attending: Radiation Oncology | Admitting: Radiation Oncology

## 2018-08-16 DIAGNOSIS — Z510 Encounter for antineoplastic radiation therapy: Secondary | ICD-10-CM | POA: Diagnosis not present

## 2018-08-16 DIAGNOSIS — C3432 Malignant neoplasm of lower lobe, left bronchus or lung: Secondary | ICD-10-CM | POA: Diagnosis not present

## 2018-08-17 ENCOUNTER — Other Ambulatory Visit: Payer: Self-pay | Admitting: Oncology

## 2018-08-19 ENCOUNTER — Inpatient Hospital Stay: Payer: PPO

## 2018-08-19 ENCOUNTER — Other Ambulatory Visit: Payer: Self-pay

## 2018-08-19 ENCOUNTER — Ambulatory Visit
Admission: RE | Admit: 2018-08-19 | Discharge: 2018-08-19 | Disposition: A | Discharge status: Home / Self Care | Payer: PPO | Source: Ambulatory Visit | Attending: Radiation Oncology | Admitting: Radiation Oncology

## 2018-08-19 VITALS — BP 99/71 | HR 100 | Temp 97.8°F | Resp 18 | Wt 179.2 lb

## 2018-08-19 DIAGNOSIS — Z95828 Presence of other vascular implants and grafts: Secondary | ICD-10-CM

## 2018-08-19 DIAGNOSIS — C3432 Malignant neoplasm of lower lobe, left bronchus or lung: Secondary | ICD-10-CM

## 2018-08-19 DIAGNOSIS — Z5111 Encounter for antineoplastic chemotherapy: Secondary | ICD-10-CM | POA: Diagnosis not present

## 2018-08-19 DIAGNOSIS — Z510 Encounter for antineoplastic radiation therapy: Secondary | ICD-10-CM | POA: Diagnosis not present

## 2018-08-19 DIAGNOSIS — C20 Malignant neoplasm of rectum: Secondary | ICD-10-CM

## 2018-08-19 LAB — CMP (CANCER CENTER ONLY)
ALT: 15 U/L (ref 0–44)
AST: 19 U/L (ref 15–41)
Albumin: 3.3 g/dL — ABNORMAL LOW (ref 3.5–5.0)
Alkaline Phosphatase: 53 U/L (ref 38–126)
Anion gap: 11 (ref 5–15)
BUN: 12 mg/dL (ref 8–23)
CO2: 22 mmol/L (ref 22–32)
Calcium: 9.1 mg/dL (ref 8.9–10.3)
Chloride: 105 mmol/L (ref 98–111)
Creatinine: 1.24 mg/dL (ref 0.61–1.24)
GFR, Est AFR Am: >60 mL/min (ref >60)
GFR, Estimated: 58 mL/min — ABNORMAL LOW (ref >60)
Glucose, Bld: 108 mg/dL — ABNORMAL HIGH (ref 70–99)
Potassium: 4 mmol/L (ref 3.5–5.1)
Sodium: 138 mmol/L (ref 135–145)
Total Bilirubin: 0.5 mg/dL (ref 0.3–1.2)
Total Protein: 6.9 g/dL (ref 6.5–8.1)

## 2018-08-19 LAB — CBC WITH DIFFERENTIAL (CANCER CENTER ONLY)
Abs Immature Granulocytes: 0.01 10*3/uL (ref 0.00–0.07)
Basophils Absolute: 0 10*3/uL (ref 0.0–0.1)
Basophils Relative: 1 %
Eosinophils Absolute: 0.1 10*3/uL (ref 0.0–0.5)
Eosinophils Relative: 3 %
HCT: 34 % — ABNORMAL LOW (ref 39.0–52.0)
Hemoglobin: 11.4 g/dL — ABNORMAL LOW (ref 13.0–17.0)
Immature Granulocytes: 1 %
Lymphocytes Relative: 24 %
Lymphs Abs: 0.4 10*3/uL — ABNORMAL LOW (ref 0.7–4.0)
MCH: 33.6 pg (ref 26.0–34.0)
MCHC: 33.5 g/dL (ref 30.0–36.0)
MCV: 100.3 fL — ABNORMAL HIGH (ref 80.0–100.0)
Monocytes Absolute: 0.1 10*3/uL (ref 0.1–1.0)
Monocytes Relative: 10 %
Neutro Abs: 0.9 10*3/uL — ABNORMAL LOW (ref 1.7–7.7)
Neutrophils Relative %: 61 %
Platelet Count: 109 10*3/uL — ABNORMAL LOW (ref 150–400)
RBC: 3.39 MIL/uL — ABNORMAL LOW (ref 4.22–5.81)
RDW: 13.6 % (ref 11.5–15.5)
WBC Count: 1.5 10*3/uL — ABNORMAL LOW (ref 4.0–10.5)
nRBC: 0 % (ref 0.0–0.2)

## 2018-08-19 MED ORDER — SODIUM CHLORIDE 0.9% FLUSH
10.0000 mL | Freq: Once | INTRAVENOUS | Status: AC
  Administered 2018-08-19: 10 mL
  Filled 2018-08-19: qty 10

## 2018-08-19 MED ORDER — SODIUM CHLORIDE 0.9% FLUSH
10.0000 mL | Freq: Once | INTRAVENOUS | Status: AC
  Administered 2018-08-19: 09:00:00 10 mL
  Filled 2018-08-19: qty 10

## 2018-08-19 MED ORDER — HEPARIN SOD (PORK) LOCK FLUSH 100 UNIT/ML IV SOLN
500.0000 [IU] | Freq: Once | INTRAVENOUS | Status: AC
  Administered 2018-08-19: 09:00:00 500 [IU]
  Filled 2018-08-19: qty 5

## 2018-08-19 NOTE — Patient Instructions (Signed)
Neutropenia Neutropenia is a condition that occurs when you have a lower-than-normal level of a type of white blood cell (neutrophil) in your body. Neutrophils are made in the spongy center of large bones (bone marrow), and they fight infections. Neutrophils are your body's main defense against bacterial and fungal infections. The fewer neutrophils you have and the longer your body remains without them, the greater your risk of getting a severe infection. What are the causes? This condition can occur if your body uses up or destroys neutrophils faster than your bone marrow can make them. Neutropenia may be caused by:  A bacterial or fungal infection.  Allergic disorders.  Reactions to some medicines.  An autoimmune disease.  An enlarged spleen. This condition can also occur if your bone marrow does not produce enough neutrophils. This problem may be caused by:  Cancer.  Cancer treatments, such as radiation or chemotherapy.  Viral infections.  Medicines, such as phenytoin.  Vitamin B12 deficiency.  Diseases of the bone marrow.  Environmental toxins, such as insecticides. What are the signs or symptoms? This condition does not usually cause symptoms. If symptoms are present, they are usually caused by an underlying infection. Symptoms of an infection may include:  Fever.  Chills.  Swollen glands.  Oral or anal ulcers.  Cough and shortness of breath.  Rash.  Skin infection.  Fatigue. How is this diagnosed? Your health care provider may suspect neutropenia if you have:  A condition that may cause neutropenia.  Symptoms during or after treatment for cancer.  Symptoms of infection, especially fever.  Frequent and unusual infections. This condition is diagnosed based on your medical history and a physical exam. Tests will also be done, such as:  A complete blood count (CBC).  A procedure to collect a sample of bone marrow for examination (bone marrow biopsy).   A chest X-ray.  A urine culture.  A blood culture. How is this treated? Treatment depends on the underlying cause and severity of your condition. Mild neutropenia may not require treatment. Treatment may include medicines, such as:  Antibiotic medicine given through an IV.  Antiviral medicines.  Antifungal medicines.  A medicine to increase neutrophil production (colony-stimulating factor). You may get this drug through an IV or by injection.  Steroids given through an IV. If an underlying condition is causing neutropenia, you may need treatment for that condition. If medicines or cancer treatments are causing neutropenia, your health care provider may have you stop the medicines or treatment. Follow these instructions at home: Medicines   Take over-the-counter and prescription medicines only as told by your health care provider.  Get a seasonal flu shot (influenza vaccine).  Avoid people who received a vaccine in the past 30 days if that vaccine contained a live version of the germ (live vaccine). You should not get a live vaccine. Common live vaccines are polio, MMR, chicken pox, and shingles vaccines. Eating and drinking  Do not share food utensils.  Do not eat unpasteurized foods.  Do not eat raw or undercooked meat, eggs, or seafood.  Do not eat unwashed, raw fruits or vegetables. Lifestyle  Avoid exposure to groups of people or children.  Avoid being around people who are sick.  Avoid being around dirt or dust, such as in construction areas or gardens.  Do not provide direct care for pets. Avoid animal droppings. Do not clean litter boxes and bird cages.  Do not have sex unless your health care provider has approved. Hygiene     Bathe daily.  Clean the area between the genitals and the anus (perineal area) after you urinate or have a bowel movement. If you are male, wipe from front to back.  Brush your teeth with a soft toothbrush before and after meals.   Do not use a regular razor. Use an electric razor to remove hair.  Wash your hands often. Make sure others who come in contact with you also wash their hands. If soap and water are not available, use hand sanitizer. General instructions  Follow any precautions as told by your health care provider to reduce your risk for injury or infection.  Take actions to avoid cuts and burns. For example: ? Be cautious when you use knives. Always cut away from yourself. ? Keep knives in protective sheaths or guards when not in use. ? Use oven mitts when you cook with a hot stove, oven, or grill. ? Stand a safe distance away from open fires.  Do not use tampons, enemas, or rectal suppositories unless your health care provider has approved.  Keep all follow-up visits as told by your health care provider. This is important. Contact a health care provider if:  You have: ? A sore throat. ? A warm, red, or tender area on your skin. ? A cough. ? Frequent or painful urination. ? Vaginal discharge or itching.  You develop: ? Sores in your mouth or anus. ? Swollen lymph nodes. ? Red streaks on the skin. ? A rash. Get help right away if:  You have: ? A fever. ? Chills, or you start to shake.  You feel: ? Nauseous, or you vomit. ? Very fatigued. ? Short of breath. Summary  Neutropenia is a condition that occurs when you have a lower-than-normal level of a type of white blood cell (neutrophil) in your body.  This condition can occur if your body uses up or destroys neutrophils faster than your bone marrow can make them.  Treatment depends on the underlying cause and severity of your condition. Mild neutropenia may not require treatment.  Follow any precautions as told by your health care provider to reduce your risk for injury or infection. This information is not intended to replace advice given to you by your health care provider. Make sure you discuss any questions you have with your health  care provider. Document Released: 06/17/2001 Document Revised: 10/11/2017 Document Reviewed: 10/11/2017 Elsevier Patient Education  2020 [employer]  

## 2018-08-19 NOTE — Progress Notes (Signed)
Per [name], NP no treatment today. ANC 0.9

## 2018-08-20 ENCOUNTER — Ambulatory Visit
Admission: RE | Admit: 2018-08-20 | Discharge: 2018-08-20 | Disposition: A | Discharge status: Home / Self Care | Payer: PPO | Source: Ambulatory Visit | Attending: Radiation Oncology | Admitting: Radiation Oncology

## 2018-08-20 ENCOUNTER — Other Ambulatory Visit: Payer: Self-pay

## 2018-08-20 DIAGNOSIS — C3432 Malignant neoplasm of lower lobe, left bronchus or lung: Secondary | ICD-10-CM | POA: Diagnosis not present

## 2018-08-20 DIAGNOSIS — Z510 Encounter for antineoplastic radiation therapy: Secondary | ICD-10-CM | POA: Diagnosis not present

## 2018-08-21 ENCOUNTER — Other Ambulatory Visit: Payer: Self-pay

## 2018-08-21 ENCOUNTER — Ambulatory Visit
Admission: RE | Admit: 2018-08-21 | Discharge: 2018-08-21 | Disposition: A | Discharge status: Home / Self Care | Payer: PPO | Source: Ambulatory Visit | Attending: Radiation Oncology | Admitting: Radiation Oncology

## 2018-08-21 DIAGNOSIS — Z510 Encounter for antineoplastic radiation therapy: Secondary | ICD-10-CM | POA: Diagnosis not present

## 2018-08-21 DIAGNOSIS — C20 Malignant neoplasm of rectum: Secondary | ICD-10-CM | POA: Diagnosis not present

## 2018-08-21 DIAGNOSIS — J8410 Pulmonary fibrosis, unspecified: Secondary | ICD-10-CM | POA: Diagnosis not present

## 2018-08-21 DIAGNOSIS — C3432 Malignant neoplasm of lower lobe, left bronchus or lung: Secondary | ICD-10-CM | POA: Diagnosis not present

## 2018-08-22 ENCOUNTER — Ambulatory Visit
Admission: RE | Admit: 2018-08-22 | Discharge: 2018-08-22 | Disposition: A | Discharge status: Home / Self Care | Payer: PPO | Source: Ambulatory Visit | Attending: Radiation Oncology | Admitting: Radiation Oncology

## 2018-08-22 ENCOUNTER — Other Ambulatory Visit: Payer: Self-pay

## 2018-08-22 ENCOUNTER — Other Ambulatory Visit: Payer: Self-pay | Admitting: *Deleted

## 2018-08-22 DIAGNOSIS — Z510 Encounter for antineoplastic radiation therapy: Secondary | ICD-10-CM | POA: Diagnosis not present

## 2018-08-22 DIAGNOSIS — C3432 Malignant neoplasm of lower lobe, left bronchus or lung: Secondary | ICD-10-CM

## 2018-08-23 ENCOUNTER — Inpatient Hospital Stay (HOSPITAL_BASED_OUTPATIENT_CLINIC_OR_DEPARTMENT_OTHER): Payer: PPO | Admitting: Nurse Practitioner

## 2018-08-23 ENCOUNTER — Ambulatory Visit
Admission: RE | Admit: 2018-08-23 | Discharge: 2018-08-23 | Disposition: A | Discharge status: Home / Self Care | Payer: PPO | Source: Ambulatory Visit | Attending: Radiation Oncology | Admitting: Radiation Oncology

## 2018-08-23 ENCOUNTER — Inpatient Hospital Stay: Payer: PPO

## 2018-08-23 ENCOUNTER — Encounter: Payer: Self-pay | Admitting: Nurse Practitioner

## 2018-08-23 ENCOUNTER — Other Ambulatory Visit: Payer: Self-pay

## 2018-08-23 VITALS — BP 107/65

## 2018-08-23 VITALS — BP 91/68 | HR 104 | Temp 97.1°F | Resp 18 | Ht 67.0 in | Wt 177.2 lb

## 2018-08-23 DIAGNOSIS — C20 Malignant neoplasm of rectum: Secondary | ICD-10-CM

## 2018-08-23 DIAGNOSIS — C3432 Malignant neoplasm of lower lobe, left bronchus or lung: Secondary | ICD-10-CM

## 2018-08-23 DIAGNOSIS — Z5111 Encounter for antineoplastic chemotherapy: Secondary | ICD-10-CM | POA: Diagnosis not present

## 2018-08-23 DIAGNOSIS — Z510 Encounter for antineoplastic radiation therapy: Secondary | ICD-10-CM | POA: Diagnosis not present

## 2018-08-23 DIAGNOSIS — Z95828 Presence of other vascular implants and grafts: Secondary | ICD-10-CM

## 2018-08-23 LAB — CBC WITH DIFFERENTIAL (CANCER CENTER ONLY)
Abs Immature Granulocytes: 0.01 10*3/uL (ref 0.00–0.07)
Basophils Absolute: 0 10*3/uL (ref 0.0–0.1)
Basophils Relative: 1 %
Eosinophils Absolute: 0 10*3/uL (ref 0.0–0.5)
Eosinophils Relative: 3 %
HCT: 34.4 % — ABNORMAL LOW (ref 39.0–52.0)
Hemoglobin: 11.4 g/dL — ABNORMAL LOW (ref 13.0–17.0)
Immature Granulocytes: 1 %
Lymphocytes Relative: 16 %
Lymphs Abs: 0.3 10*3/uL — ABNORMAL LOW (ref 0.7–4.0)
MCH: 33.6 pg (ref 26.0–34.0)
MCHC: 33.1 g/dL (ref 30.0–36.0)
MCV: 101.5 fL — ABNORMAL HIGH (ref 80.0–100.0)
Monocytes Absolute: 0.4 10*3/uL (ref 0.1–1.0)
Monocytes Relative: 26 %
Neutro Abs: 0.9 10*3/uL — ABNORMAL LOW (ref 1.7–7.7)
Neutrophils Relative %: 53 %
Platelet Count: 92 10*3/uL — ABNORMAL LOW (ref 150–400)
RBC: 3.39 MIL/uL — ABNORMAL LOW (ref 4.22–5.81)
RDW: 14 % (ref 11.5–15.5)
WBC Count: 1.6 10*3/uL — ABNORMAL LOW (ref 4.0–10.5)
nRBC: 0 % (ref 0.0–0.2)

## 2018-08-23 LAB — CMP (CANCER CENTER ONLY)
ALT: 14 U/L (ref 0–44)
AST: 19 U/L (ref 15–41)
Albumin: 3.2 g/dL — ABNORMAL LOW (ref 3.5–5.0)
Alkaline Phosphatase: 59 U/L (ref 38–126)
Anion gap: 12 (ref 5–15)
BUN: 12 mg/dL (ref 8–23)
CO2: 23 mmol/L (ref 22–32)
Calcium: 9.2 mg/dL (ref 8.9–10.3)
Chloride: 105 mmol/L (ref 98–111)
Creatinine: 1.32 mg/dL — ABNORMAL HIGH (ref 0.61–1.24)
GFR, Est AFR Am: >60 mL/min (ref >60)
GFR, Estimated: 54 mL/min — ABNORMAL LOW (ref >60)
Glucose, Bld: 136 mg/dL — ABNORMAL HIGH (ref 70–99)
Potassium: 4 mmol/L (ref 3.5–5.1)
Sodium: 140 mmol/L (ref 135–145)
Total Bilirubin: 0.5 mg/dL (ref 0.3–1.2)
Total Protein: 6.9 g/dL (ref 6.5–8.1)

## 2018-08-23 MED ORDER — SODIUM CHLORIDE 0.9% FLUSH
10.0000 mL | Freq: Once | INTRAVENOUS | Status: DC
  Filled 2018-08-23: qty 10

## 2018-08-23 MED ORDER — SODIUM CHLORIDE 0.9 % IV SOLN
1000.0000 mL | INTRAVENOUS | Status: AC
  Administered 2018-08-23: 1000 mL via INTRAVENOUS
  Filled 2018-08-23: qty 1000

## 2018-08-23 MED ORDER — SODIUM CHLORIDE 0.9% FLUSH
10.0000 mL | Freq: Once | INTRAVENOUS | Status: AC
  Administered 2018-08-23: 10 mL
  Filled 2018-08-23: qty 10

## 2018-08-23 MED ORDER — HEPARIN SOD (PORK) LOCK FLUSH 100 UNIT/ML IV SOLN
500.0000 [IU] | Freq: Once | INTRAVENOUS | Status: DC
  Filled 2018-08-23: qty 5

## 2018-08-23 MED ORDER — HEPARIN SOD (PORK) LOCK FLUSH 100 UNIT/ML IV SOLN
500.0000 [IU] | Freq: Once | INTRAVENOUS | Status: AC
  Administered 2018-08-23: 500 [IU]
  Filled 2018-08-23: qty 5

## 2018-08-23 NOTE — Progress Notes (Signed)
[facility] OFFICE PROGRESS NOTE   Diagnosis: Rectal cancer, non-small cell lung cancer  INTERVAL HISTORY:   Mr. Dowson returns as scheduled.  He continues radiation.  He completed cycle 2 weekly Taxol/carboplatin 08/12/2018.  Treatment was held 08/19/2018 due to [place] 0.9.  He feels well.  He denies nausea/vomiting.  No mouth sores.  No diarrhea.  Stable numbness/tingling in the fingertips and toes.  No mouth sores.  No rash.  No fever, cough, shortness of breath.  No chest pain.  He feels he may be dehydrated.  He reports suboptimal fluid intake, estimating a total of 3 cups of water yesterday and some orange juice so far today.  He has occasional lightheadedness/dizziness.  He denies bleeding.  Objective:  Vital signs in last 24 hours:  Blood pressure 91/68, pulse (!) 104, temperature (!) 97.1 F (36.2 C), temperature source Temporal, resp. rate 18, height '5\' 7"'  (1.702 m), weight 177 lb 3.2 oz (80.4 kg), SpO2 90 %.   Well-appearing man, no acute distress. HEENT: No thrush or ulcers.  Tongue appears mildly dry. Resp: Rales at both lung bases.  No respiratory distress. Cardio: Regular rhythm, mild tachycardia. GI: Abdomen soft and nontender.  No hepatomegaly. Vascular: No leg edema. Neuro: Alert and oriented. Skin: Skin turgor intact. Port-A-Cath without erythema.   Lab Results:  Lab Results  Component Value Date   WBC 1.6 (L) 08/23/2018   HGB 11.4 (L) 08/23/2018   HCT 34.4 (L) 08/23/2018   MCV 101.5 (H) 08/23/2018   PLT 92 (L) 08/23/2018   NEUTROABS 0.9 (L) 08/23/2018    Imaging:  No results found.  Medications: I have reviewed the patient's current medications.  Assessment/Plan: 1. Rectal cancer ? Distal rectal mass noted on colonoscopy 01/21/2018, biopsy confirmed invasive adenocarcinoma, cytokeratin 20 (patchy) and CDX 2+, cytokeratin 7 and TTF-1 negative. PIK3CA, APC, TP53 detected. ? CTs 01/29/2018-new left infrahilar mass, enlarged left hilar lymph  node, mild subcarinal lymphadenopathy, confluent ill-defined lesion in area fibrosis at the posterior left lower lobe measuring 2 x 1.5 cm, rectal mass, perirectal lymph nodes ? MRI 01/28/2018-[id number] located at 6.6 cm from the internal anal sphincter, 2 mesorectal lymph nodes ? Hypermetabolic left infrahilar mass. Hypermetabolic nodule left lower lobe. Focal hypermetabolic activity within the rectum. ? Bronchoscopy 02/07/2018-no endobronchial lesions or abnormal secretions seen. Transbronchial brushings obtained from the left lower lobe nodule. Significant enlargement of the station 7, station 10 L, station 11 L nodes. Needle biopsies were taken from station 7 and 11 L nodes. ? Pathology from the left lower lobe brushings, a level 7 lymph node returned positive for adenocarcinoma, positive for cytokeratin 7 and CDX2, negative for cytokeratin 20 and TTF-1. Fine-needle aspiration EBUS, 7, B with malignant cells consistent with adenocarcinoma; CCND1 (amp) detected. ? Cycle 1 FOLFOX 02/21/2018 ? Cycle 2 FOLFOX 03/13/2018,Udenyca added ? Cycle 3 FOLFOX 03/28/2018 ? Cycle 4 FOLFOX 04/18/2018 (oxaliplatin held) ? CT chest 04/19/2018-left infrahilar node previously measured 2.6 x 3.4, current 1.8 x 2.0. 11 mm paraesophageal lymph node, previously 14 mm. ? Cycle 5 FOLFOX 05/02/2018 ? Cycle 6 FOLFOX 05/16/2018 ? CT chest, abdomen, and pelvis on 05/28/2018-stable left lower lobe nodule, decreased left infrahilar mediastinal adenopathy, suspicion of right jugular vein thrombus, decreased perirectal lymph node, stable rectal wall thickening, new right ureteropelvic and distal right ureter stones with hydroureteronephrosis ? Radiation/Xeloda 06/10/2018-07/18/2018 2. Pulmonary fibrosis 3. Kidney stones-new stones with right hydroureteronephrosis on CT 05/27/2018 4. BPH 5. History of gastroesophageal reflux 6. Mild renal insufficiency 7. Port-A-Cath  placement 02/14/2018 8. Neutropenia and thrombocytopenia secondary  to chemotherapy, Udenyca added with cycle 2 and oxaliplatin dose reduced 9. Tachycardia-chest CT 04/19/2018 negative for pulmonary embolus; underlying fibrotic lung disease again noted; slight increase patchy groundglass attenuation right upper lobe. Echocardiogram 04/23/2018-calculated EF 64%. Tachycardia improved 05/02/2018 10. Cystoscopy/right ureteroscopic stone extraction 06/10/2018 11. 06/04/2018 right upper extremity venous Doppler-acute deep vein thrombosis involving the right internal jugular veins. Eliquis initiated 12. Non-small cell lung cancer-hypermetabolic left infrahilar mass, and left lower lobe nodule  Pathology from the left lower lobe brushings, a level 7 lymph node returned positive for adenocarcinoma, positive for cytokeratin 7 and CDX2, negative for cytokeratin 20 and TTF-1. Fine-needle aspiration EBUS, 7, B with malignant cells consistent with adenocarcinoma; CCND1 (amp) detected.  Weekly Taxol/carboplatin radiation beginning 08/05/2018  Cycle 2 weekly Taxol/carboplatin 08/12/2018   Disposition: Mr. Ambrosini appears stable.  He has completed 2 cycles of weekly Taxol/carboplatin.  Week 3 was held due to neutropenia.  He has persistent neutropenia on labs today as well as mild thrombocytopenia. We will cancel the week 3 chemotherapy scheduled 08/26/2018 and move to 08/30/2018.  I reviewed neutropenic and thrombocytopenic precautions with him.  He understands to contact the office with fever, chills, other signs of infection, bleeding.  He has tachycardia and mild hypotension.  He may be mildly dehydrated.  He reports suboptimal fluid intake.  We will give him IV fluids in the office today and he will increase oral fluid intake at home.  He will return for lab, follow-up, Taxol/carboplatin in 1 week.  He will contact the office in the interim as outlined above or with any other problems.    [name] ANP/GNP-BC   08/23/2018  10:39 AM

## 2018-08-23 NOTE — Patient Instructions (Signed)
Dehydration, Adult  Dehydration is a condition in which there is not enough fluid or water in the body. This happens when you lose more fluids than you take in. Important organs, such as the kidneys, brain, and heart, cannot function without a proper amount of fluids. Any loss of fluids from the body can lead to dehydration. Dehydration can range from mild to severe. This condition should be treated right away to prevent it from becoming severe. What are the causes? This condition may be caused by:  Vomiting.  Diarrhea.  Excessive sweating, such as from heat exposure or exercise.  Not drinking enough fluid, especially: ? When ill. ? While doing activity that requires a lot of energy.  Excessive urination.  Fever.  Infection.  Certain medicines, such as medicines that cause the body to lose excess fluid (diuretics).  Inability to access safe drinking water.  Reduced physical ability to get adequate water and food. What increases the risk? This condition is more likely to develop in people:  Who have a poorly controlled long-term (chronic) illness, such as diabetes, heart disease, or kidney disease.  Who are age 59 or older.  Who are disabled.  Who live in a place with high altitude.  Who play endurance sports. What are the signs or symptoms? Symptoms of mild dehydration may include:  Thirst.  Dry lips.  Slightly dry mouth.  Dry, warm skin.  Dizziness. Symptoms of moderate dehydration may include:  Very dry mouth.  Muscle cramps.  Dark urine. Urine may be the color of tea.  Decreased urine production.  Decreased tear production.  Heartbeat that is irregular or faster than normal (palpitations).  Headache.  Light-headedness, especially when you stand up from a sitting position.  Fainting (syncope). Symptoms of severe dehydration may include:  Changes in skin, such as: ? Cold and clammy skin. ? Blotchy (mottled) or pale skin. ? Skin that does  not quickly return to normal after being lightly pinched and released (poor skin turgor).  Changes in body fluids, such as: ? Extreme thirst. ? No tear production. ? Inability to sweat when body temperature is high, such as in hot weather. ? Very little urine production.  Changes in vital signs, such as: ? Weak pulse. ? Pulse that is more than 100 beats a minute when sitting still. ? Rapid breathing. ? Low blood pressure.  Other changes, such as: ? Sunken eyes. ? Cold hands and feet. ? Confusion. ? Lack of energy (lethargy). ? Difficulty waking up from sleep. ? Short-term weight loss. ? Unconsciousness. How is this diagnosed? This condition is diagnosed based on your symptoms and a physical exam. Blood and urine tests may be done to help confirm the diagnosis. How is this treated? Treatment for this condition depends on the severity. Mild or moderate dehydration can often be treated at home. Treatment should be started right away. Do not wait until dehydration becomes severe. Severe dehydration is an emergency and it needs to be treated in a hospital. Treatment for mild dehydration may include:  Drinking more fluids.  Replacing salts and minerals in your blood (electrolytes) that you may have lost. Treatment for moderate dehydration may include:  Drinking an oral rehydration solution (ORS). This is a drink that helps you replace fluids and electrolytes (rehydrate). It can be found at pharmacies and retail stores. Treatment for severe dehydration may include:  Receiving fluids through an IV tube.  Receiving an electrolyte solution through a feeding tube that is passed through your nose and  into your stomach (nasogastric tube, or NG tube).  Correcting any abnormalities in electrolytes.  Treating the underlying cause of dehydration. Follow these instructions at home:  If directed by your health care provider, drink an ORS: ? Make an ORS by following instructions on the  package. ? Start by drinking small amounts, about  cup (120 mL) every 5-10 minutes. ? Slowly increase how much you drink until you have taken the amount recommended by your health care provider.  Drink enough clear fluid to keep your urine clear or pale yellow. If you were told to drink an ORS, finish the ORS first, then start slowly drinking other clear fluids. Drink fluids such as: ? Water. Do not drink only water. Doing that can lead to having too little salt (sodium) in the body (hyponatremia). ? Ice chips. ? Fruit juice that you have added water to (diluted fruit juice). ? Low-calorie sports drinks.  Avoid: ? Alcohol. ? Drinks that contain a lot of sugar. These include high-calorie sports drinks, fruit juice that is not diluted, and soda. ? Caffeine. ? Foods that are greasy or contain a lot of fat or sugar.  Take over-the-counter and prescription medicines only as told by your health care provider.  Do not take sodium tablets. This can lead to having too much sodium in the body (hypernatremia).  Eat foods that contain a healthy balance of electrolytes, such as bananas, oranges, potatoes, tomatoes, and spinach.  Keep all follow-up visits as told by your health care provider. This is important. Contact a health care provider if:  You have abdominal pain that: ? Gets worse. ? Stays in one area (localizes).  You have a rash.  You have a stiff neck.  You are more irritable than usual.  You are sleepier or more difficult to wake up than usual.  You feel weak or dizzy.  You feel very thirsty.  You have urinated only a small amount of very dark urine over 6-8 hours. Get help right away if:  You have symptoms of severe dehydration.  You cannot drink fluids without vomiting.  Your symptoms get worse with treatment.  You have a fever.  You have a severe headache.  You have vomiting or diarrhea that: ? Gets worse. ? Does not go away.  You have blood or green matter  (bile) in your vomit.  You have blood in your stool. This may cause stool to look black and tarry.  You have not urinated in 6-8 hours.  You faint.  Your heart rate while sitting still is over 100 beats a minute.  You have trouble breathing. This information is not intended to replace advice given to you by your health care provider. Make sure you discuss any questions you have with your health care provider. Document Released: 12/26/2004 Document Revised: 12/08/2016 Document Reviewed: 02/19/2015 Elsevier Patient Education  2020 [employer].

## 2018-08-25 ENCOUNTER — Other Ambulatory Visit: Payer: Self-pay | Admitting: Oncology

## 2018-08-26 ENCOUNTER — Ambulatory Visit
Admission: RE | Admit: 2018-08-26 | Discharge: 2018-08-26 | Disposition: A | Discharge status: Home / Self Care | Payer: PPO | Source: Ambulatory Visit | Attending: Radiation Oncology | Admitting: Radiation Oncology

## 2018-08-26 ENCOUNTER — Inpatient Hospital Stay: Payer: PPO

## 2018-08-26 ENCOUNTER — Other Ambulatory Visit: Payer: Self-pay

## 2018-08-26 VITALS — BP 99/72 | HR 113 | Temp 97.8°F | Resp 16

## 2018-08-26 DIAGNOSIS — Z5111 Encounter for antineoplastic chemotherapy: Secondary | ICD-10-CM | POA: Diagnosis not present

## 2018-08-26 DIAGNOSIS — C20 Malignant neoplasm of rectum: Secondary | ICD-10-CM

## 2018-08-26 DIAGNOSIS — Z95828 Presence of other vascular implants and grafts: Secondary | ICD-10-CM

## 2018-08-26 DIAGNOSIS — C3432 Malignant neoplasm of lower lobe, left bronchus or lung: Secondary | ICD-10-CM

## 2018-08-26 DIAGNOSIS — Z510 Encounter for antineoplastic radiation therapy: Secondary | ICD-10-CM | POA: Diagnosis not present

## 2018-08-26 LAB — CMP (CANCER CENTER ONLY)
ALT: 13 U/L (ref 0–44)
AST: 21 U/L (ref 15–41)
Albumin: 3.4 g/dL — ABNORMAL LOW (ref 3.5–5.0)
Alkaline Phosphatase: 57 U/L (ref 38–126)
Anion gap: 10 (ref 5–15)
BUN: 12 mg/dL (ref 8–23)
CO2: 23 mmol/L (ref 22–32)
Calcium: 9.2 mg/dL (ref 8.9–10.3)
Chloride: 106 mmol/L (ref 98–111)
Creatinine: 1.15 mg/dL (ref 0.61–1.24)
GFR, Est AFR Am: >60 mL/min (ref >60)
GFR, Estimated: >60 mL/min (ref >60)
Glucose, Bld: 122 mg/dL — ABNORMAL HIGH (ref 70–99)
Potassium: 3.8 mmol/L (ref 3.5–5.1)
Sodium: 139 mmol/L (ref 135–145)
Total Bilirubin: 0.5 mg/dL (ref 0.3–1.2)
Total Protein: 7.1 g/dL (ref 6.5–8.1)

## 2018-08-26 LAB — CBC WITH DIFFERENTIAL (CANCER CENTER ONLY)
Abs Immature Granulocytes: 0.01 10*3/uL (ref 0.00–0.07)
Basophils Absolute: 0.1 10*3/uL (ref 0.0–0.1)
Basophils Relative: 2 %
Eosinophils Absolute: 0.1 10*3/uL (ref 0.0–0.5)
Eosinophils Relative: 2 %
HCT: 36 % — ABNORMAL LOW (ref 39.0–52.0)
Hemoglobin: 11.8 g/dL — ABNORMAL LOW (ref 13.0–17.0)
Immature Granulocytes: 0 %
Lymphocytes Relative: 16 %
Lymphs Abs: 0.4 10*3/uL — ABNORMAL LOW (ref 0.7–4.0)
MCH: 33.1 pg (ref 26.0–34.0)
MCHC: 32.8 g/dL (ref 30.0–36.0)
MCV: 101.1 fL — ABNORMAL HIGH (ref 80.0–100.0)
Monocytes Absolute: 0.5 10*3/uL (ref 0.1–1.0)
Monocytes Relative: 17 %
Neutro Abs: 1.7 10*3/uL (ref 1.7–7.7)
Neutrophils Relative %: 63 %
Platelet Count: 89 10*3/uL — ABNORMAL LOW (ref 150–400)
RBC: 3.56 MIL/uL — ABNORMAL LOW (ref 4.22–5.81)
RDW: 13.8 % (ref 11.5–15.5)
WBC Count: 2.7 10*3/uL — ABNORMAL LOW (ref 4.0–10.5)
nRBC: 0 % (ref 0.0–0.2)

## 2018-08-26 MED ORDER — PALONOSETRON HCL INJECTION 0.25 MG/5ML: 0.2500 mg | Freq: Once | INTRAVENOUS | Status: DC

## 2018-08-26 MED ORDER — SODIUM CHLORIDE 0.9% FLUSH
10.0000 mL | INTRAVENOUS | Status: DC | PRN
  Administered 2018-08-26: 11:00:00 10 mL
  Filled 2018-08-26: qty 10

## 2018-08-26 MED ORDER — DEXAMETHASONE SODIUM PHOSPHATE 10 MG/ML IJ SOLN
INTRAMUSCULAR | Status: AC
  Filled 2018-08-26: qty 1

## 2018-08-26 MED ORDER — PALONOSETRON HCL INJECTION 0.25 MG/5ML
INTRAVENOUS | Status: AC
  Filled 2018-08-26: qty 5

## 2018-08-26 MED ORDER — DIPHENHYDRAMINE HCL 50 MG/ML IJ SOLN
INTRAMUSCULAR | Status: AC
  Filled 2018-08-26: qty 1

## 2018-08-26 MED ORDER — SODIUM CHLORIDE 0.9 % IV SOLN
Freq: Once | INTRAVENOUS | Status: AC
  Administered 2018-08-26: 09:00:00 via INTRAVENOUS
  Filled 2018-08-26: qty 250

## 2018-08-26 MED ORDER — DIPHENHYDRAMINE HCL 50 MG/ML IJ SOLN: 25.0000 mg | Freq: Once | INTRAMUSCULAR | Status: DC

## 2018-08-26 MED ORDER — SODIUM CHLORIDE 0.9% FLUSH
10.0000 mL | Freq: Once | INTRAVENOUS | Status: AC
  Administered 2018-08-26: 10 mL
  Filled 2018-08-26: qty 10

## 2018-08-26 MED ORDER — FAMOTIDINE IN NACL 20-0.9 MG/50ML-% IV SOLN
INTRAVENOUS | Status: AC
  Filled 2018-08-26: qty 50

## 2018-08-26 MED ORDER — FAMOTIDINE IN NACL 20-0.9 MG/50ML-% IV SOLN: 20.0000 mg | Freq: Once | INTRAVENOUS | Status: DC

## 2018-08-26 MED ORDER — SODIUM CHLORIDE 0.9 % IV SOLN: 35.0000 mg/m2 | Freq: Once | INTRAVENOUS | Status: DC

## 2018-08-26 MED ORDER — SODIUM CHLORIDE 0.9 % IV SOLN: 10.0000 mg | Freq: Once | INTRAVENOUS | Status: DC

## 2018-08-26 MED ORDER — HEPARIN SOD (PORK) LOCK FLUSH 100 UNIT/ML IV SOLN
500.0000 [IU] | Freq: Once | INTRAVENOUS | Status: AC | PRN
  Administered 2018-08-26: 500 [IU]
  Filled 2018-08-26: qty 5

## 2018-08-26 MED ORDER — SODIUM CHLORIDE 0.9 % IV SOLN: 125.1000 mg | Freq: Once | INTRAVENOUS | Status: DC

## 2018-08-26 NOTE — Patient Instructions (Signed)

## 2018-08-26 NOTE — Progress Notes (Signed)
Per Cira Rue NP, no chemotherapy today. Proceed with IV NS bolus as documented in MAR. Patient tolerated well. Reviewed schedule with patient, and he knows to RTC on 08/30/2018.

## 2018-08-27 ENCOUNTER — Ambulatory Visit
Admission: RE | Admit: 2018-08-27 | Discharge: 2018-08-27 | Disposition: A | Discharge status: Home / Self Care | Payer: PPO | Source: Ambulatory Visit | Attending: Radiation Oncology | Admitting: Radiation Oncology

## 2018-08-27 ENCOUNTER — Other Ambulatory Visit: Payer: Self-pay

## 2018-08-27 DIAGNOSIS — Z510 Encounter for antineoplastic radiation therapy: Secondary | ICD-10-CM | POA: Diagnosis not present

## 2018-08-27 DIAGNOSIS — C3432 Malignant neoplasm of lower lobe, left bronchus or lung: Secondary | ICD-10-CM | POA: Diagnosis not present

## 2018-08-28 ENCOUNTER — Ambulatory Visit
Admission: RE | Admit: 2018-08-28 | Discharge: 2018-08-28 | Disposition: A | Discharge status: Home / Self Care | Payer: PPO | Source: Ambulatory Visit | Attending: Radiation Oncology | Admitting: Radiation Oncology

## 2018-08-28 ENCOUNTER — Other Ambulatory Visit: Payer: Self-pay

## 2018-08-28 DIAGNOSIS — Z510 Encounter for antineoplastic radiation therapy: Secondary | ICD-10-CM | POA: Diagnosis not present

## 2018-08-28 DIAGNOSIS — C3432 Malignant neoplasm of lower lobe, left bronchus or lung: Secondary | ICD-10-CM | POA: Diagnosis not present

## 2018-08-29 ENCOUNTER — Ambulatory Visit
Admission: RE | Admit: 2018-08-29 | Discharge: 2018-08-29 | Disposition: A | Discharge status: Home / Self Care | Payer: PPO | Source: Ambulatory Visit | Attending: Radiation Oncology | Admitting: Radiation Oncology

## 2018-08-29 ENCOUNTER — Other Ambulatory Visit: Payer: Self-pay

## 2018-08-29 ENCOUNTER — Other Ambulatory Visit: Payer: Self-pay | Admitting: *Deleted

## 2018-08-29 DIAGNOSIS — C3432 Malignant neoplasm of lower lobe, left bronchus or lung: Secondary | ICD-10-CM

## 2018-08-29 DIAGNOSIS — Z510 Encounter for antineoplastic radiation therapy: Secondary | ICD-10-CM | POA: Diagnosis not present

## 2018-08-30 ENCOUNTER — Other Ambulatory Visit: Payer: Self-pay

## 2018-08-30 ENCOUNTER — Inpatient Hospital Stay: Payer: PPO

## 2018-08-30 ENCOUNTER — Ambulatory Visit
Admission: RE | Admit: 2018-08-30 | Discharge: 2018-08-30 | Disposition: A | Discharge status: Home / Self Care | Payer: PPO | Source: Ambulatory Visit | Attending: Radiation Oncology | Admitting: Radiation Oncology

## 2018-08-30 ENCOUNTER — Inpatient Hospital Stay (HOSPITAL_BASED_OUTPATIENT_CLINIC_OR_DEPARTMENT_OTHER): Payer: PPO | Admitting: Oncology

## 2018-08-30 VITALS — BP 98/66 | HR 110 | Temp 97.8°F | Resp 20 | Ht 67.0 in | Wt 177.3 lb

## 2018-08-30 DIAGNOSIS — C3432 Malignant neoplasm of lower lobe, left bronchus or lung: Secondary | ICD-10-CM

## 2018-08-30 DIAGNOSIS — Z95828 Presence of other vascular implants and grafts: Secondary | ICD-10-CM

## 2018-08-30 DIAGNOSIS — C20 Malignant neoplasm of rectum: Secondary | ICD-10-CM

## 2018-08-30 DIAGNOSIS — Z5111 Encounter for antineoplastic chemotherapy: Secondary | ICD-10-CM | POA: Diagnosis not present

## 2018-08-30 DIAGNOSIS — Z510 Encounter for antineoplastic radiation therapy: Secondary | ICD-10-CM | POA: Diagnosis not present

## 2018-08-30 LAB — CBC WITH DIFFERENTIAL (CANCER CENTER ONLY)
Abs Immature Granulocytes: 0.02 10*3/uL (ref 0.00–0.07)
Basophils Absolute: 0 10*3/uL (ref 0.0–0.1)
Basophils Relative: 1 %
Eosinophils Absolute: 0.1 10*3/uL (ref 0.0–0.5)
Eosinophils Relative: 2 %
HCT: 35.5 % — ABNORMAL LOW (ref 39.0–52.0)
Hemoglobin: 11.9 g/dL — ABNORMAL LOW (ref 13.0–17.0)
Immature Granulocytes: 1 %
Lymphocytes Relative: 6 %
Lymphs Abs: 0.2 10*3/uL — ABNORMAL LOW (ref 0.7–4.0)
MCH: 33.7 pg (ref 26.0–34.0)
MCHC: 33.5 g/dL (ref 30.0–36.0)
MCV: 100.6 fL — ABNORMAL HIGH (ref 80.0–100.0)
Monocytes Absolute: 0.4 10*3/uL (ref 0.1–1.0)
Monocytes Relative: 13 %
Neutro Abs: 2.7 10*3/uL (ref 1.7–7.7)
Neutrophils Relative %: 77 %
Platelet Count: 68 10*3/uL — ABNORMAL LOW (ref 150–400)
RBC: 3.53 MIL/uL — ABNORMAL LOW (ref 4.22–5.81)
RDW: 14 % (ref 11.5–15.5)
WBC Count: 3.4 10*3/uL — ABNORMAL LOW (ref 4.0–10.5)
nRBC: 0 % (ref 0.0–0.2)

## 2018-08-30 MED ORDER — SODIUM CHLORIDE 0.9 % IV SOLN
35.0000 mg/m2 | Freq: Once | INTRAVENOUS | Status: AC
  Administered 2018-08-30: 66 mg via INTRAVENOUS
  Filled 2018-08-30: qty 11

## 2018-08-30 MED ORDER — SODIUM CHLORIDE 0.9% FLUSH
10.0000 mL | INTRAVENOUS | Status: DC | PRN
  Administered 2018-08-30: 10 mL
  Filled 2018-08-30: qty 10

## 2018-08-30 MED ORDER — DIPHENHYDRAMINE HCL 50 MG/ML IJ SOLN
25.0000 mg | Freq: Once | INTRAMUSCULAR | Status: AC
  Administered 2018-08-30: 11:00:00 25 mg via INTRAVENOUS

## 2018-08-30 MED ORDER — DIPHENHYDRAMINE HCL 50 MG/ML IJ SOLN
INTRAMUSCULAR | Status: AC
  Filled 2018-08-30: qty 1

## 2018-08-30 MED ORDER — SODIUM CHLORIDE 0.9 % IV SOLN
Freq: Once | INTRAVENOUS | Status: AC
  Administered 2018-08-30: 10:00:00 via INTRAVENOUS
  Filled 2018-08-30: qty 250

## 2018-08-30 MED ORDER — FAMOTIDINE IN NACL 20-0.9 MG/50ML-% IV SOLN
20.0000 mg | Freq: Once | INTRAVENOUS | Status: AC
  Administered 2018-08-30: 11:00:00 20 mg via INTRAVENOUS

## 2018-08-30 MED ORDER — SODIUM CHLORIDE 0.9% FLUSH
10.0000 mL | Freq: Once | INTRAVENOUS | Status: AC
  Administered 2018-08-30: 10 mL
  Filled 2018-08-30: qty 10

## 2018-08-30 MED ORDER — HEPARIN SOD (PORK) LOCK FLUSH 100 UNIT/ML IV SOLN
500.0000 [IU] | Freq: Once | INTRAVENOUS | Status: AC | PRN
  Administered 2018-08-30: 500 [IU]
  Filled 2018-08-30: qty 5

## 2018-08-30 MED ORDER — DEXAMETHASONE SODIUM PHOSPHATE 10 MG/ML IJ SOLN
INTRAMUSCULAR | Status: AC
  Filled 2018-08-30: qty 1

## 2018-08-30 MED ORDER — FAMOTIDINE IN NACL 20-0.9 MG/50ML-% IV SOLN
INTRAVENOUS | Status: AC
  Filled 2018-08-30: qty 50

## 2018-08-30 MED ORDER — DEXAMETHASONE SODIUM PHOSPHATE 10 MG/ML IJ SOLN
10.0000 mg | Freq: Once | INTRAMUSCULAR | Status: AC
  Administered 2018-08-30: 10 mg via INTRAVENOUS

## 2018-08-30 NOTE — Progress Notes (Signed)
[place] OFFICE PROGRESS NOTE   Diagnosis: Rectal cancer, lung cancer  INTERVAL HISTORY:   Austin Yu returns as scheduled.  He feels well.  No nausea, dyspnea, or bleeding.  He continues daily radiation.  Chemotherapy was held last week secondary to neutropenia and thrombocytopenia.  No neuropathy symptoms.  Objective:  Vital signs in last 24 hours:  Blood pressure 98/66, pulse (!) 120, temperature 97.8 F (36.6 C), temperature source Oral, resp. rate 20, height '5\' 7"'  (1.702 m), weight 177 lb 4.8 oz (80.4 kg), SpO2 93 %.    HEENT: No thrush or ulcers Resp: Inspiratory rales throughout the posterior chest, no respiratory distress Cardio: Regular rate and rhythm, tachycardia GI: No hepatosplenomegaly, nontender Vascular: Leg edema  Portacath/PICC-without erythema  Lab Results:  Lab Results  Component Value Date   WBC 3.4 (L) 08/30/2018   HGB 11.9 (L) 08/30/2018   HCT 35.5 (L) 08/30/2018   MCV 100.6 (H) 08/30/2018   PLT 68 (L) 08/30/2018   NEUTROABS 2.7 08/30/2018    CMP  Lab Results  Component Value Date   NA 139 08/26/2018   K 3.8 08/26/2018   CL 106 08/26/2018   CO2 23 08/26/2018   GLUCOSE 122 (H) 08/26/2018   BUN 12 08/26/2018   CREATININE 1.15 08/26/2018   CALCIUM 9.2 08/26/2018   PROT 7.1 08/26/2018   ALBUMIN 3.4 (L) 08/26/2018   AST 21 08/26/2018   ALT 13 08/26/2018   ALKPHOS 57 08/26/2018   BILITOT 0.5 08/26/2018   GFRNONAA >60 08/26/2018   GFRAA >60 08/26/2018    Lab Results  Component Value Date   CEA1 17.61 (H) 07/17/2018    Medications: I have reviewed the patient's current medications.   Assessment/Plan: 1. Rectal cancer ? Distal rectal mass noted on colonoscopy 01/21/2018, biopsy confirmed invasive adenocarcinoma, cytokeratin 20 (patchy) and CDX 2+, cytokeratin 7 and TTF-1 negative. PIK3CA, APC, TP53 detected. ? CTs 01/29/2018-new left infrahilar mass, enlarged left hilar lymph node, mild subcarinal lymphadenopathy,  confluent ill-defined lesion in area fibrosis at the posterior left lower lobe measuring 2 x 1.5 cm, rectal mass, perirectal lymph nodes ? MRI 01/28/2018-[id number] located at 6.6 cm from the internal anal sphincter, 2 mesorectal lymph nodes ? Hypermetabolic left infrahilar mass. Hypermetabolic nodule left lower lobe. Focal hypermetabolic activity within the rectum. ? Bronchoscopy 02/07/2018-no endobronchial lesions or abnormal secretions seen. Transbronchial brushings obtained from the left lower lobe nodule. Significant enlargement of the station 7, station 10 L, station 11 L nodes. Needle biopsies were taken from station 7 and 11 L nodes. ? Pathology from the left lower lobe brushings, a level 7 lymph node returned positive for adenocarcinoma, positive for cytokeratin 7 and CDX2, negative for cytokeratin 20 and TTF-1. Fine-needle aspiration EBUS, 7, B with malignant cells consistent with adenocarcinoma; CCND1 (amp) detected. ? Cycle 1 FOLFOX 02/21/2018 ? Cycle 2 FOLFOX 03/13/2018,Udenyca added ? Cycle 3 FOLFOX 03/28/2018 ? Cycle 4 FOLFOX 04/18/2018 (oxaliplatin held) ? CT chest 04/19/2018-left infrahilar node previously measured 2.6 x 3.4, current 1.8 x 2.0. 11 mm paraesophageal lymph node, previously 14 mm. ? Cycle 5 FOLFOX 05/02/2018 ? Cycle 6 FOLFOX 05/16/2018 ? CT chest, abdomen, and pelvis on 05/28/2018-stable left lower lobe nodule, decreased left infrahilar mediastinal adenopathy, suspicion of right jugular vein thrombus, decreased perirectal lymph node, stable rectal wall thickening, new right ureteropelvic and distal right ureter stones with hydroureteronephrosis ? Radiation/Xeloda 06/10/2018- 07/18/2018 2. Pulmonary fibrosis 3. Kidney stones-new stones with right hydroureteronephrosis on CT 05/27/2018 4. BPH 5. History of  gastroesophageal reflux 6. Mild renal insufficiency 7. Port-A-Cath placement 02/14/2018 8. Neutropenia and thrombocytopenia secondary to chemotherapy, Udenyca added with  cycle 2 and oxaliplatin dose reduced 9. Tachycardia-chest CT 04/19/2018 negative for pulmonary embolus; underlying fibrotic lung disease again noted; slight increase patchy groundglass attenuation right upper lobe. Echocardiogram 04/23/2018-calculated EF 64%. Tachycardia improved 05/02/2018 10. Cystoscopy/right ureteroscopic stone extraction 06/10/2018 11. 06/04/2018 right upper extremity venous Doppler- acute deep vein thrombosis involving the right internal jugular veins.  Eliquis initiated 12. Non-small cell lung cancer- hypermetabolic left infrahilar mass, and left lower lobe nodule  Pathology from the left lower lobe brushings, a level 7 lymph node returned positive for adenocarcinoma, positive for cytokeratin 7 and CDX2, negative for cytokeratin 20 and TTF-1. Fine-needle aspiration EBUS, 7, B with malignant cells consistent with adenocarcinoma; CCND1 (amp) detected.  Weekly Taxol/carboplatin radiation beginning 08/05/2018  Chemotherapy delayed beginning 08/19/2018 secondary to neutropenia and thrombocytopenia  Week 3 chemotherapy 08/30/2018 with single agent Taxol     Disposition: Austin Yu appears well.  The neutrophil count has recovered, but he has persistent moderate thrombocytopenia.  He will complete a treatment with Taxol today.  The Taxol will be dose reduced.  Carboplatin will be held.  He will call for bleeding or bruising.  He will decrease the Eliquis to once daily.  Austin Yu will be scheduled for a CBC on 09/03/2018.  He will return for an office visit and week for chemotherapy on 09/06/2018. 25 minutes were spent with the patient today.  The majority of the time was used for counseling and coordination of care.  Betsy Coder, MD  08/30/2018  9:35 AM

## 2018-08-30 NOTE — Progress Notes (Signed)
Per Dr. Benay Spice: OK to treat with platelet count 68,000. Will delete carbo from regimen today and give Taxol only. OK to treat w/CMP results of 08/26/18. Per Dr. Benay Spice: OK to treat w/pulse at 110-120. Patient runs tachy.

## 2018-08-30 NOTE — Progress Notes (Signed)
  Radiation Oncology         [phone]) [phone] ________________________________  Name: ANIKIN PROSSER MRN: [id number]  Date: 07/24/2018  DOB: 02/02/1945  RESPIRATORY MOTION MANAGEMENT SIMULATION  NARRATIVE:  In order to account for effect of respiratory motion on target structures and other organs in the planning and delivery of radiotherapy, this patient underwent respiratory motion management simulation.  To accomplish this, when the patient was brought to the CT simulation planning suite, 4D respiratoy motion management CT images were obtained.  The CT images were loaded into the planning software.  Then, using a variety of tools including Cine, MIP, and standard views, the target volume and planning target volumes (PTV) were delineated.  Avoidance structures were contoured.  Treatment planning then occurred.  Dose volume histograms were generated and reviewed for each of the requested structure.  The resulting plan was carefully reviewed and approved today.   ------------------------------------------------  Jodelle Gross, MD, PhD

## 2018-08-30 NOTE — Progress Notes (Signed)
  Radiation Oncology         [phone] ________________________________  Name: ARAM DOMZALSKI MRN: [id number]  Date: 07/24/2018  DOB: 06-09-1945  SIMULATION AND TREATMENT PLANNING NOTE  DIAGNOSIS:     ICD-10-CM   1. Primary malignant neoplasm of bronchus of left lower lobe (HCC)  C34.32      Site:  chest  NARRATIVE:  The patient was brought to the [facility].  Identity was confirmed.  All relevant records and images related to the planned course of therapy were reviewed.   Written consent to proceed with treatment was confirmed which was freely given after reviewing the details related to the planned course of therapy had been reviewed with the patient.  Then, the patient was set-up in a stable reproducible  supine position for radiation therapy.  CT images were obtained.  Surface markings were placed.    Medically necessary complex treatment device(s) for immobilization:  Vac-lock bag.   The CT images were loaded into the planning software.  Then the target and avoidance structures were contoured.  Treatment planning then occurred.  The radiation prescription was entered and confirmed.  A total of 4 complex treatment devices were fabricated which relate to the designed radiation treatment fields. Additional reduced fields will be used as necessary to improve the dose homogeneity of the plan. Each of these customized fields/ complex treatment devices will be used on a daily basis during the radiation course. I have requested : 3D Simulation  I have requested a DVH of the following structures: target volume, spinal cord, lungs, heart.   The patient will undergo daily image guidance to ensure accurate localization of the target, and adequate minimize dose to the normal surrounding structures in close proximity to the target.  PLAN:  The patient will receive 60 Gy in 30 fractions initially.    Special treatment procedure The patient will also receive concurrent  chemotherapy during the treatment. The patient may therefore experience increased toxicity or side effects and the patient will be monitored for such problems. This may require extra lab work as necessary. This therefore constitutes a special treatment procedure.   ________________________________   Jodelle Gross, MD, PhD

## 2018-08-30 NOTE — Patient Instructions (Signed)
Paclitaxel injection What is this medicine? PACLITAXEL (PAK li TAX el) is a chemotherapy drug. It targets fast dividing cells, like cancer cells, and causes these cells to die. This medicine is used to treat ovarian cancer, breast cancer, lung cancer, Kaposi's sarcoma, and other cancers. This medicine may be used for other purposes; ask your health care provider or pharmacist if you have questions. COMMON BRAND NAME(S): Onxol, Taxol What should I tell my health care provider before I take this medicine? They need to know if you have any of these conditions:  history of irregular heartbeat  liver disease  low blood counts, like low white cell, platelet, or red cell counts  lung or breathing disease, like asthma  tingling of the fingers or toes, or other nerve disorder  an unusual or allergic reaction to paclitaxel, alcohol, polyoxyethylated castor oil, other chemotherapy, other medicines, foods, dyes, or preservatives  pregnant or trying to get pregnant  breast-feeding How should I use this medicine? This drug is given as an infusion into a vein. It is administered in a hospital or clinic by a specially trained health care professional. Talk to your pediatrician regarding the use of this medicine in children. Special care may be needed. Overdosage: If you think you have taken too much of this medicine contact a poison control center or emergency room at once. NOTE: This medicine is only for you. Do not share this medicine with others. What if I miss a dose? It is important not to miss your dose. Call your doctor or health care professional if you are unable to keep an appointment. What may interact with this medicine? Do not take this medicine with any of the following medications:  disulfiram  metronidazole This medicine may also interact with the following medications:  antiviral medicines for hepatitis, HIV or AIDS  certain antibiotics like erythromycin and  clarithromycin  certain medicines for fungal infections like ketoconazole and itraconazole  certain medicines for seizures like carbamazepine, phenobarbital, phenytoin  gemfibrozil  nefazodone  rifampin  St. John's wort This list may not describe all possible interactions. Give your health care provider a list of all the medicines, herbs, non-prescription drugs, or dietary supplements you use. Also tell them if you smoke, drink alcohol, or use illegal drugs. Some items may interact with your medicine. What should I watch for while using this medicine? Your condition will be monitored carefully while you are receiving this medicine. You will need important blood work done while you are taking this medicine. This medicine can cause serious allergic reactions. To reduce your risk you will need to take other medicine(s) before treatment with this medicine. If you experience allergic reactions like skin rash, itching or hives, swelling of the face, lips, or tongue, tell your doctor or health care professional right away. In some cases, you may be given additional medicines to help with side effects. Follow all directions for their use. This drug may make you feel generally unwell. This is not uncommon, as chemotherapy can affect healthy cells as well as cancer cells. Report any side effects. Continue your course of treatment even though you feel ill unless your doctor tells you to stop. Call your doctor or health care professional for advice if you get a fever, chills or sore throat, or other symptoms of a cold or flu. Do not treat yourself. This drug decreases your body's ability to fight infections. Try to avoid being around people who are sick. This medicine may increase your risk to bruise  or bleed. Call your doctor or health care professional if you notice any unusual bleeding. Be careful brushing and flossing your teeth or using a toothpick because you may get an infection or bleed more easily.  If you have any dental work done, tell your dentist you are receiving this medicine. Avoid taking products that contain aspirin, acetaminophen, ibuprofen, naproxen, or ketoprofen unless instructed by your doctor. These medicines may hide a fever. Do not become pregnant while taking this medicine. Women should inform their doctor if they wish to become pregnant or think they might be pregnant. There is a potential for serious side effects to an unborn child. Talk to your health care professional or pharmacist for more information. Do not breast-feed an infant while taking this medicine. Men are advised not to father a child while receiving this medicine. This product may contain alcohol. Ask your pharmacist or healthcare provider if this medicine contains alcohol. Be sure to tell all healthcare providers you are taking this medicine. Certain medicines, like metronidazole and disulfiram, can cause an unpleasant reaction when taken with alcohol. The reaction includes flushing, headache, nausea, vomiting, sweating, and increased thirst. The reaction can last from 30 minutes to several hours. What side effects may I notice from receiving this medicine? Side effects that you should report to your doctor or health care professional as soon as possible:  allergic reactions like skin rash, itching or hives, swelling of the face, lips, or tongue  breathing problems  changes in vision  fast, irregular heartbeat  high or low blood pressure  mouth sores  pain, tingling, numbness in the hands or feet  signs of decreased platelets or bleeding - bruising, pinpoint red spots on the skin, black, tarry stools, blood in the urine  signs of decreased red blood cells - unusually weak or tired, feeling faint or lightheaded, falls  signs of infection - fever or chills, cough, sore throat, pain or difficulty passing urine  signs and symptoms of liver injury like dark yellow or brown urine; general ill feeling or  flu-like symptoms; light-colored stools; loss of appetite; nausea; right upper belly pain; unusually weak or tired; yellowing of the eyes or skin  swelling of the ankles, feet, hands  unusually slow heartbeat Side effects that usually do not require medical attention (report to your doctor or health care professional if they continue or are bothersome):  diarrhea  hair loss  loss of appetite  muscle or joint pain  nausea, vomiting  pain, redness, or irritation at site where injected  tiredness This list may not describe all possible side effects. Call your doctor for medical advice about side effects. You may report side effects to FDA at 1-800-FDA-1088. Where should I keep my medicine? This drug is given in a hospital or clinic and will not be stored at home. NOTE: This sheet is a summary. It may not cover all possible information. If you have questions about this medicine, talk to your doctor, pharmacist, or health care provider.  2020 Elsevier/Gold Standard (2016-08-29 13:14:55)

## 2018-09-01 ENCOUNTER — Other Ambulatory Visit: Payer: Self-pay | Admitting: Oncology

## 2018-09-02 ENCOUNTER — Other Ambulatory Visit: Payer: PPO

## 2018-09-02 ENCOUNTER — Ambulatory Visit
Admission: RE | Admit: 2018-09-02 | Discharge: 2018-09-02 | Disposition: A | Discharge status: Home / Self Care | Payer: PPO | Source: Ambulatory Visit | Attending: Radiation Oncology | Admitting: Radiation Oncology

## 2018-09-02 ENCOUNTER — Telehealth: Payer: Self-pay | Admitting: Oncology

## 2018-09-02 ENCOUNTER — Other Ambulatory Visit: Payer: Self-pay

## 2018-09-02 ENCOUNTER — Ambulatory Visit: Payer: PPO | Admitting: Oncology

## 2018-09-02 ENCOUNTER — Ambulatory Visit: Payer: PPO

## 2018-09-02 DIAGNOSIS — C3432 Malignant neoplasm of lower lobe, left bronchus or lung: Secondary | ICD-10-CM | POA: Diagnosis not present

## 2018-09-02 DIAGNOSIS — Z510 Encounter for antineoplastic radiation therapy: Secondary | ICD-10-CM | POA: Diagnosis not present

## 2018-09-02 NOTE — Telephone Encounter (Signed)
Called and spoke with patient. Confirmed date and times of upcoming appts

## 2018-09-03 ENCOUNTER — Ambulatory Visit
Admission: RE | Admit: 2018-09-03 | Discharge: 2018-09-03 | Disposition: A | Discharge status: Home / Self Care | Payer: PPO | Source: Ambulatory Visit | Attending: Radiation Oncology | Admitting: Radiation Oncology

## 2018-09-03 ENCOUNTER — Other Ambulatory Visit: Payer: Self-pay

## 2018-09-03 DIAGNOSIS — Z510 Encounter for antineoplastic radiation therapy: Secondary | ICD-10-CM | POA: Diagnosis not present

## 2018-09-03 DIAGNOSIS — C3432 Malignant neoplasm of lower lobe, left bronchus or lung: Secondary | ICD-10-CM | POA: Diagnosis not present

## 2018-09-04 ENCOUNTER — Ambulatory Visit
Admission: RE | Admit: 2018-09-04 | Discharge: 2018-09-04 | Disposition: A | Discharge status: Home / Self Care | Payer: PPO | Source: Ambulatory Visit | Attending: Radiation Oncology | Admitting: Radiation Oncology

## 2018-09-04 ENCOUNTER — Other Ambulatory Visit: Payer: Self-pay

## 2018-09-04 DIAGNOSIS — C3432 Malignant neoplasm of lower lobe, left bronchus or lung: Secondary | ICD-10-CM | POA: Diagnosis not present

## 2018-09-04 DIAGNOSIS — Z510 Encounter for antineoplastic radiation therapy: Secondary | ICD-10-CM | POA: Diagnosis not present

## 2018-09-05 ENCOUNTER — Other Ambulatory Visit: Payer: Self-pay

## 2018-09-05 ENCOUNTER — Ambulatory Visit
Admission: RE | Admit: 2018-09-05 | Discharge: 2018-09-05 | Disposition: A | Discharge status: Home / Self Care | Payer: PPO | Source: Ambulatory Visit | Attending: Radiation Oncology | Admitting: Radiation Oncology

## 2018-09-05 DIAGNOSIS — C3432 Malignant neoplasm of lower lobe, left bronchus or lung: Secondary | ICD-10-CM | POA: Diagnosis not present

## 2018-09-05 DIAGNOSIS — Z510 Encounter for antineoplastic radiation therapy: Secondary | ICD-10-CM | POA: Diagnosis not present

## 2018-09-06 ENCOUNTER — Encounter: Payer: Self-pay | Admitting: Nurse Practitioner

## 2018-09-06 ENCOUNTER — Ambulatory Visit
Admission: RE | Admit: 2018-09-06 | Discharge: 2018-09-06 | Disposition: A | Discharge status: Home / Self Care | Payer: PPO | Source: Ambulatory Visit | Attending: Radiation Oncology | Admitting: Radiation Oncology

## 2018-09-06 ENCOUNTER — Inpatient Hospital Stay: Payer: PPO

## 2018-09-06 ENCOUNTER — Other Ambulatory Visit: Payer: Self-pay

## 2018-09-06 ENCOUNTER — Inpatient Hospital Stay (HOSPITAL_BASED_OUTPATIENT_CLINIC_OR_DEPARTMENT_OTHER): Payer: PPO | Admitting: Nurse Practitioner

## 2018-09-06 VITALS — HR 109

## 2018-09-06 VITALS — BP 102/77 | HR 112 | Temp 98.0°F | Resp 18 | Ht 67.0 in | Wt 174.4 lb

## 2018-09-06 DIAGNOSIS — C20 Malignant neoplasm of rectum: Secondary | ICD-10-CM | POA: Diagnosis not present

## 2018-09-06 DIAGNOSIS — C3432 Malignant neoplasm of lower lobe, left bronchus or lung: Secondary | ICD-10-CM

## 2018-09-06 DIAGNOSIS — Z5111 Encounter for antineoplastic chemotherapy: Secondary | ICD-10-CM | POA: Diagnosis not present

## 2018-09-06 DIAGNOSIS — Z510 Encounter for antineoplastic radiation therapy: Secondary | ICD-10-CM | POA: Diagnosis not present

## 2018-09-06 DIAGNOSIS — Z95828 Presence of other vascular implants and grafts: Secondary | ICD-10-CM

## 2018-09-06 LAB — CMP (CANCER CENTER ONLY)
ALT: 16 U/L (ref 0–44)
AST: 23 U/L (ref 15–41)
Albumin: 3.2 g/dL — ABNORMAL LOW (ref 3.5–5.0)
Alkaline Phosphatase: 58 U/L (ref 38–126)
Anion gap: 9 (ref 5–15)
BUN: 12 mg/dL (ref 8–23)
CO2: 24 mmol/L (ref 22–32)
Calcium: 9.1 mg/dL (ref 8.9–10.3)
Chloride: 105 mmol/L (ref 98–111)
Creatinine: 1.21 mg/dL (ref 0.61–1.24)
GFR, Est AFR Am: >60 mL/min (ref >60)
GFR, Estimated: 60 mL/min — ABNORMAL LOW (ref >60)
Glucose, Bld: 114 mg/dL — ABNORMAL HIGH (ref 70–99)
Potassium: 3.8 mmol/L (ref 3.5–5.1)
Sodium: 138 mmol/L (ref 135–145)
Total Bilirubin: 0.5 mg/dL (ref 0.3–1.2)
Total Protein: 7.1 g/dL (ref 6.5–8.1)

## 2018-09-06 LAB — CBC WITH DIFFERENTIAL (CANCER CENTER ONLY)
Abs Immature Granulocytes: 0.02 10*3/uL (ref 0.00–0.07)
Basophils Absolute: 0 10*3/uL (ref 0.0–0.1)
Basophils Relative: 1 %
Eosinophils Absolute: 0.2 10*3/uL (ref 0.0–0.5)
Eosinophils Relative: 6 %
HCT: 35.1 % — ABNORMAL LOW (ref 39.0–52.0)
Hemoglobin: 11.8 g/dL — ABNORMAL LOW (ref 13.0–17.0)
Immature Granulocytes: 1 %
Lymphocytes Relative: 6 %
Lymphs Abs: 0.2 10*3/uL — ABNORMAL LOW (ref 0.7–4.0)
MCH: 33.1 pg (ref 26.0–34.0)
MCHC: 33.6 g/dL (ref 30.0–36.0)
MCV: 98.6 fL (ref 80.0–100.0)
Monocytes Absolute: 0.3 10*3/uL (ref 0.1–1.0)
Monocytes Relative: 9 %
Neutro Abs: 2.3 10*3/uL (ref 1.7–7.7)
Neutrophils Relative %: 77 %
Platelet Count: 64 10*3/uL — ABNORMAL LOW (ref 150–400)
RBC: 3.56 MIL/uL — ABNORMAL LOW (ref 4.22–5.81)
RDW: 13.3 % (ref 11.5–15.5)
WBC Count: 2.9 10*3/uL — ABNORMAL LOW (ref 4.0–10.5)
nRBC: 0 % (ref 0.0–0.2)

## 2018-09-06 MED ORDER — DIPHENHYDRAMINE HCL 50 MG/ML IJ SOLN
25.0000 mg | Freq: Once | INTRAMUSCULAR | Status: AC
  Administered 2018-09-06: 12:00:00 25 mg via INTRAVENOUS

## 2018-09-06 MED ORDER — DEXAMETHASONE SODIUM PHOSPHATE 10 MG/ML IJ SOLN
INTRAMUSCULAR | Status: AC
  Filled 2018-09-06: qty 1

## 2018-09-06 MED ORDER — SODIUM CHLORIDE 0.9 % IV SOLN
Freq: Once | INTRAVENOUS | Status: AC
  Administered 2018-09-06: 12:00:00 via INTRAVENOUS
  Filled 2018-09-06: qty 250

## 2018-09-06 MED ORDER — SODIUM CHLORIDE 0.9% FLUSH
10.0000 mL | INTRAVENOUS | Status: DC | PRN
  Administered 2018-09-06: 14:00:00 10 mL
  Filled 2018-09-06: qty 10

## 2018-09-06 MED ORDER — FAMOTIDINE IN NACL 20-0.9 MG/50ML-% IV SOLN
20.0000 mg | Freq: Once | INTRAVENOUS | Status: AC
  Administered 2018-09-06: 12:00:00 20 mg via INTRAVENOUS

## 2018-09-06 MED ORDER — DIPHENHYDRAMINE HCL 50 MG/ML IJ SOLN
INTRAMUSCULAR | Status: AC
  Filled 2018-09-06: qty 1

## 2018-09-06 MED ORDER — SODIUM CHLORIDE 0.9 % IV SOLN
35.0000 mg/m2 | Freq: Once | INTRAVENOUS | Status: AC
  Administered 2018-09-06: 66 mg via INTRAVENOUS
  Filled 2018-09-06: qty 11

## 2018-09-06 MED ORDER — FAMOTIDINE IN NACL 20-0.9 MG/50ML-% IV SOLN
INTRAVENOUS | Status: AC
  Filled 2018-09-06: qty 50

## 2018-09-06 MED ORDER — SODIUM CHLORIDE 0.9% FLUSH
10.0000 mL | Freq: Once | INTRAVENOUS | Status: AC
  Administered 2018-09-06: 10:00:00 10 mL
  Filled 2018-09-06: qty 10

## 2018-09-06 MED ORDER — HEPARIN SOD (PORK) LOCK FLUSH 100 UNIT/ML IV SOLN
500.0000 [IU] | Freq: Once | INTRAVENOUS | Status: AC | PRN
  Administered 2018-09-06: 14:00:00 500 [IU]
  Filled 2018-09-06: qty 5

## 2018-09-06 MED ORDER — DEXAMETHASONE SODIUM PHOSPHATE 10 MG/ML IJ SOLN
10.0000 mg | Freq: Once | INTRAMUSCULAR | Status: AC
  Administered 2018-09-06: 12:00:00 10 mg via INTRAVENOUS

## 2018-09-06 NOTE — Progress Notes (Signed)
Per [name], NP okay to treat with HR 109.

## 2018-09-06 NOTE — Progress Notes (Signed)
[facility] OFFICE PROGRESS NOTE   Diagnosis: Rectal cancer, lung cancer  INTERVAL HISTORY:   Austin Yu returns as scheduled.  He completed a cycle of weekly Taxol on 08/30/2018.  Carboplatin was held due to thrombocytopenia.  He denies nausea/vomiting.  No mouth sores.  No constipation or diarrhea.  No skin rash.  No numbness or tingling in the hands or feet.  He has stable dyspnea on exertion and cough.  No fever.  He denies bleeding.  Appetite has been poor this week.  Objective:  Vital signs in last 24 hours:  Blood pressure 102/77, pulse (!) 112, temperature 98 F (36.7 C), temperature source Temporal, resp. rate 18, height _0  (1.702 m), weight 174 lb 6.4 oz (79.1 kg), SpO2 90 %.    HEENT: No thrush or ulcers. Resp: Rales left lung base.  No respiratory distress. Cardio: Regular, tachycardic. GI: Abdomen soft and nontender.  No hepatomegaly. Vascular: No leg edema. Neuro: Alert and oriented. Skin: Palms without erythema. Port-A-Cath without erythema.   Lab Results:  Lab Results  Component Value Date   WBC 2.9 (L) 09/06/2018   HGB 11.8 (L) 09/06/2018   HCT 35.1 (L) 09/06/2018   MCV 98.6 09/06/2018   PLT 64 (L) 09/06/2018   NEUTROABS 2.3 09/06/2018    Imaging:  No results found.  Medications: I have reviewed the patient's current medications.  Assessment/Plan: 1. Rectal cancer ? Distal rectal mass noted on colonoscopy 01/21/2018, biopsy confirmed invasive adenocarcinoma, cytokeratin 20 (patchy) and CDX 2+, cytokeratin 7 and TTF-1 negative. PIK3CA, APC, TP53 detected. ? CTs 01/29/2018-new left infrahilar mass, enlarged left hilar lymph node, mild subcarinal lymphadenopathy, confluent ill-defined lesion in area fibrosis at the posterior left lower lobe measuring 2 x 1.5 cm, rectal mass, perirectal lymph nodes ? MRI 01/28/2018-[id number] located at 6.6 cm from the internal anal sphincter, 2 mesorectal lymph nodes ? Hypermetabolic left infrahilar  mass. Hypermetabolic nodule left lower lobe. Focal hypermetabolic activity within the rectum. ? Bronchoscopy 02/07/2018-no endobronchial lesions or abnormal secretions seen. Transbronchial brushings obtained from the left lower lobe nodule. Significant enlargement of the station 7, station 10 L, station 11 L nodes. Needle biopsies were taken from station 7 and 11 L nodes. ? Pathology from the left lower lobe brushings, a level 7 lymph node returned positive for adenocarcinoma, positive for cytokeratin 7 and CDX2, negative for cytokeratin 20 and TTF-1. Fine-needle aspiration EBUS, 7, B with malignant cells consistent with adenocarcinoma; CCND1 (amp) detected. ? Cycle 1 FOLFOX 02/21/2018 ? Cycle 2 FOLFOX 03/13/2018,Udenyca added ? Cycle 3 FOLFOX 03/28/2018 ? Cycle 4 FOLFOX 04/18/2018 (oxaliplatin held) ? CT chest 04/19/2018-left infrahilar node previously measured 2.6 x 3.4, current 1.8 x 2.0. 11 mm paraesophageal lymph node, previously 14 mm. ? Cycle 5 FOLFOX 05/02/2018 ? Cycle 6 FOLFOX 05/16/2018 ? CT chest, abdomen, and pelvis on 05/28/2018-stable left lower lobe nodule, decreased left infrahilar mediastinal adenopathy, suspicion of right jugular vein thrombus, decreased perirectal lymph node, stable rectal wall thickening, new right ureteropelvic and distal right ureter stones with hydroureteronephrosis ? Radiation/Xeloda 06/10/2018-07/18/2018 2. Pulmonary fibrosis 3. Kidney stones-new stones with right hydroureteronephrosis on CT 05/27/2018 4. BPH 5. History of gastroesophageal reflux 6. Mild renal insufficiency 7. Port-A-Cath placement 02/14/2018 8. Neutropenia and thrombocytopenia secondary to chemotherapy, Udenyca added with cycle 2 and oxaliplatin dose reduced 9. Tachycardia-chest CT 04/19/2018 negative for pulmonary embolus; underlying fibrotic lung disease again noted; slight increase patchy groundglass attenuation right upper lobe. Echocardiogram 04/23/2018-calculated EF 64%. Tachycardia  improved 05/02/2018 10. Cystoscopy/right  ureteroscopic stone extraction 06/10/2018 11. 06/04/2018 right upper extremity venous Doppler-acute deep vein thrombosis involving the right internal jugular veins. Eliquis initiated 12. Non-small cell lung cancer-hypermetabolic left infrahilar mass, and left lower lobe nodule  Pathology from the left lower lobe brushings, a level 7 lymph node returned positive for adenocarcinoma, positive for cytokeratin 7 and CDX2, negative for cytokeratin 20 and TTF-1. Fine-needle aspiration EBUS, 7, B with malignant cells consistent with adenocarcinoma; CCND1 (amp) detected.  Weekly Taxol/carboplatin radiation beginning 08/05/2018  Chemotherapy delayed beginning 08/19/2018 secondary to neutropenia and thrombocytopenia  Week 3 chemotherapy 08/30/2018 with single agent Taxol  Week 4 chemotherapy 09/06/2018 with single agent Taxol  Disposition: Austin Yu appears stable.  He continues radiation.  He has completed 3 weeks of chemotherapy.  Last week he received single agent Taxol.  Carboplatin was held due to thrombocytopenia.  Review of the CBC from today shows persistent thrombocytopenia.  He will receive single agent Taxol today.  He understands to contact the office with any bleeding.  He will return for lab, follow-up, chemotherapy in 1 week.  He will contact the office in the interim as outlined above or with any other problems.  Plan reviewed with Dr. Benay Spice.    [name] ANP/GNP-BC   09/06/2018  10:18 AM

## 2018-09-06 NOTE — Patient Instructions (Signed)
[facility] Discharge Instructions for Patients Receiving Chemotherapy  Today you received the following chemotherapy agents:  Taxol.  To help prevent nausea and vomiting after your treatment, we encourage you to take your nausea medication as directed.   If you develop nausea and vomiting that is not controlled by your nausea medication, call the clinic.   BELOW ARE SYMPTOMS THAT SHOULD BE REPORTED IMMEDIATELY:  *FEVER GREATER THAN 100.5 F  *CHILLS WITH OR WITHOUT FEVER  NAUSEA AND VOMITING THAT IS NOT CONTROLLED WITH YOUR NAUSEA MEDICATION  *UNUSUAL SHORTNESS OF BREATH  *UNUSUAL BRUISING OR BLEEDING  TENDERNESS IN MOUTH AND THROAT WITH OR WITHOUT PRESENCE OF ULCERS  *URINARY PROBLEMS  *BOWEL PROBLEMS  UNUSUAL RASH Items with * indicate a potential emergency and should be followed up as soon as possible.  Feel free to call the clinic should you have any questions or concerns. The clinic phone number is [phone].  Please show the CHEMO ALERT CARD at check-in to the Emergency Department and triage nurse.   

## 2018-09-07 ENCOUNTER — Other Ambulatory Visit: Payer: Self-pay | Admitting: Oncology

## 2018-09-09 ENCOUNTER — Other Ambulatory Visit: Payer: Self-pay

## 2018-09-09 ENCOUNTER — Ambulatory Visit
Admission: RE | Admit: 2018-09-09 | Discharge: 2018-09-09 | Disposition: A | Discharge status: Home / Self Care | Payer: PPO | Source: Ambulatory Visit | Attending: Radiation Oncology | Admitting: Radiation Oncology

## 2018-09-09 DIAGNOSIS — Z510 Encounter for antineoplastic radiation therapy: Secondary | ICD-10-CM | POA: Diagnosis not present

## 2018-09-09 DIAGNOSIS — C3432 Malignant neoplasm of lower lobe, left bronchus or lung: Secondary | ICD-10-CM | POA: Diagnosis not present

## 2018-09-10 ENCOUNTER — Ambulatory Visit: Payer: PPO

## 2018-09-11 ENCOUNTER — Other Ambulatory Visit: Payer: Self-pay

## 2018-09-11 ENCOUNTER — Ambulatory Visit
Admission: RE | Admit: 2018-09-11 | Discharge: 2018-09-11 | Disposition: A | Discharge status: Home / Self Care | Payer: PPO | Source: Ambulatory Visit | Attending: Radiation Oncology | Admitting: Radiation Oncology

## 2018-09-11 DIAGNOSIS — C3432 Malignant neoplasm of lower lobe, left bronchus or lung: Secondary | ICD-10-CM | POA: Diagnosis not present

## 2018-09-11 DIAGNOSIS — Z510 Encounter for antineoplastic radiation therapy: Secondary | ICD-10-CM | POA: Diagnosis not present

## 2018-09-11 DIAGNOSIS — C20 Malignant neoplasm of rectum: Secondary | ICD-10-CM | POA: Diagnosis not present

## 2018-09-12 ENCOUNTER — Ambulatory Visit
Admission: RE | Admit: 2018-09-12 | Discharge: 2018-09-12 | Disposition: A | Discharge status: Home / Self Care | Payer: PPO | Source: Ambulatory Visit | Attending: Radiation Oncology | Admitting: Radiation Oncology

## 2018-09-12 ENCOUNTER — Other Ambulatory Visit: Payer: Self-pay

## 2018-09-12 DIAGNOSIS — Z510 Encounter for antineoplastic radiation therapy: Secondary | ICD-10-CM | POA: Diagnosis not present

## 2018-09-12 DIAGNOSIS — C3432 Malignant neoplasm of lower lobe, left bronchus or lung: Secondary | ICD-10-CM | POA: Diagnosis not present

## 2018-09-13 ENCOUNTER — Ambulatory Visit: Payer: PPO

## 2018-09-13 ENCOUNTER — Inpatient Hospital Stay: Payer: PPO

## 2018-09-13 ENCOUNTER — Other Ambulatory Visit: Payer: Self-pay | Admitting: Radiation Oncology

## 2018-09-13 ENCOUNTER — Other Ambulatory Visit: Payer: Self-pay

## 2018-09-13 ENCOUNTER — Encounter: Payer: Self-pay | Admitting: Nurse Practitioner

## 2018-09-13 ENCOUNTER — Inpatient Hospital Stay: Payer: PPO | Attending: Oncology

## 2018-09-13 ENCOUNTER — Inpatient Hospital Stay (HOSPITAL_BASED_OUTPATIENT_CLINIC_OR_DEPARTMENT_OTHER): Payer: PPO | Admitting: Nurse Practitioner

## 2018-09-13 VITALS — BP 100/71 | HR 109 | Resp 17

## 2018-09-13 VITALS — BP 82/73 | HR 114 | Temp 98.2°F | Resp 18 | Wt 168.1 lb

## 2018-09-13 DIAGNOSIS — D6959 Other secondary thrombocytopenia: Secondary | ICD-10-CM | POA: Diagnosis not present

## 2018-09-13 DIAGNOSIS — C20 Malignant neoplasm of rectum: Secondary | ICD-10-CM

## 2018-09-13 DIAGNOSIS — N289 Disorder of kidney and ureter, unspecified: Secondary | ICD-10-CM | POA: Diagnosis not present

## 2018-09-13 DIAGNOSIS — R42 Dizziness and giddiness: Secondary | ICD-10-CM | POA: Diagnosis not present

## 2018-09-13 DIAGNOSIS — Z5111 Encounter for antineoplastic chemotherapy: Secondary | ICD-10-CM | POA: Diagnosis not present

## 2018-09-13 DIAGNOSIS — Z7901 Long term (current) use of anticoagulants: Secondary | ICD-10-CM | POA: Diagnosis not present

## 2018-09-13 DIAGNOSIS — C3432 Malignant neoplasm of lower lobe, left bronchus or lung: Secondary | ICD-10-CM | POA: Diagnosis not present

## 2018-09-13 DIAGNOSIS — J8410 Pulmonary fibrosis, unspecified: Secondary | ICD-10-CM | POA: Insufficient documentation

## 2018-09-13 DIAGNOSIS — R1310 Dysphagia, unspecified: Secondary | ICD-10-CM | POA: Insufficient documentation

## 2018-09-13 DIAGNOSIS — R5383 Other fatigue: Secondary | ICD-10-CM | POA: Diagnosis not present

## 2018-09-13 DIAGNOSIS — D701 Agranulocytosis secondary to cancer chemotherapy: Secondary | ICD-10-CM | POA: Diagnosis not present

## 2018-09-13 DIAGNOSIS — I82C11 Acute embolism and thrombosis of right internal jugular vein: Secondary | ICD-10-CM | POA: Diagnosis not present

## 2018-09-13 DIAGNOSIS — Z95828 Presence of other vascular implants and grafts: Secondary | ICD-10-CM

## 2018-09-13 DIAGNOSIS — Z510 Encounter for antineoplastic radiation therapy: Secondary | ICD-10-CM | POA: Diagnosis not present

## 2018-09-13 LAB — CBC WITH DIFFERENTIAL (CANCER CENTER ONLY)
Abs Immature Granulocytes: 0.01 10*3/uL (ref 0.00–0.07)
Basophils Absolute: 0 10*3/uL (ref 0.0–0.1)
Basophils Relative: 1 %
Eosinophils Absolute: 0.1 10*3/uL (ref 0.0–0.5)
Eosinophils Relative: 3 %
HCT: 34.1 % — ABNORMAL LOW (ref 39.0–52.0)
Hemoglobin: 11.6 g/dL — ABNORMAL LOW (ref 13.0–17.0)
Immature Granulocytes: 1 %
Lymphocytes Relative: 8 %
Lymphs Abs: 0.2 10*3/uL — ABNORMAL LOW (ref 0.7–4.0)
MCH: 33 pg (ref 26.0–34.0)
MCHC: 34 g/dL (ref 30.0–36.0)
MCV: 96.9 fL (ref 80.0–100.0)
Monocytes Absolute: 0.2 10*3/uL (ref 0.1–1.0)
Monocytes Relative: 12 %
Neutro Abs: 1.5 10*3/uL — ABNORMAL LOW (ref 1.7–7.7)
Neutrophils Relative %: 75 %
Platelet Count: 85 10*3/uL — ABNORMAL LOW (ref 150–400)
RBC: 3.52 MIL/uL — ABNORMAL LOW (ref 4.22–5.81)
RDW: 13.4 % (ref 11.5–15.5)
WBC Count: 2 10*3/uL — ABNORMAL LOW (ref 4.0–10.5)
nRBC: 0 % (ref 0.0–0.2)

## 2018-09-13 LAB — CMP (CANCER CENTER ONLY)
ALT: 15 U/L (ref 0–44)
AST: 23 U/L (ref 15–41)
Albumin: 3.4 g/dL — ABNORMAL LOW (ref 3.5–5.0)
Alkaline Phosphatase: 57 U/L (ref 38–126)
Anion gap: 12 (ref 5–15)
BUN: 20 mg/dL (ref 8–23)
CO2: 23 mmol/L (ref 22–32)
Calcium: 8.9 mg/dL (ref 8.9–10.3)
Chloride: 102 mmol/L (ref 98–111)
Creatinine: 1.55 mg/dL — ABNORMAL HIGH (ref 0.61–1.24)
GFR, Est AFR Am: 51 mL/min — ABNORMAL LOW (ref >60)
GFR, Estimated: 44 mL/min — ABNORMAL LOW (ref >60)
Glucose, Bld: 192 mg/dL — ABNORMAL HIGH (ref 70–99)
Potassium: 3.8 mmol/L (ref 3.5–5.1)
Sodium: 137 mmol/L (ref 135–145)
Total Bilirubin: 0.6 mg/dL (ref 0.3–1.2)
Total Protein: 7 g/dL (ref 6.5–8.1)

## 2018-09-13 MED ORDER — SODIUM CHLORIDE 0.9% FLUSH
10.0000 mL | Freq: Once | INTRAVENOUS | Status: AC
  Administered 2018-09-13: 15:00:00 10 mL
  Filled 2018-09-13: qty 10

## 2018-09-13 MED ORDER — SUCRALFATE 1 G PO TABS: 1.0000 g | ORAL_TABLET | Freq: Four times a day (QID) | ORAL | 2 refills | Status: DC

## 2018-09-13 MED ORDER — SODIUM CHLORIDE 0.9 % IV SOLN
INTRAVENOUS | Status: DC
  Administered 2018-09-13: 13:00:00 via INTRAVENOUS
  Filled 2018-09-13 (×2): qty 250

## 2018-09-13 MED ORDER — SODIUM CHLORIDE 0.9% FLUSH
10.0000 mL | Freq: Once | INTRAVENOUS | Status: AC
  Administered 2018-09-13: 10 mL
  Filled 2018-09-13: qty 10

## 2018-09-13 MED ORDER — HEPARIN SOD (PORK) LOCK FLUSH 100 UNIT/ML IV SOLN
500.0000 [IU] | Freq: Once | INTRAVENOUS | Status: AC
  Administered 2018-09-13: 15:00:00 500 [IU]
  Filled 2018-09-13: qty 5

## 2018-09-13 NOTE — Patient Instructions (Signed)
Dehydration, Adult  Dehydration is when there is not enough fluid or water in your body. This happens when you lose more fluids than you take in. Dehydration can range from mild to very bad. It should be treated right away to keep it from getting very bad. Symptoms of mild dehydration may include:  Thirst.  Dry lips.  Slightly dry mouth.  Dry, warm skin.  Dizziness. Symptoms of moderate dehydration may include:  Very dry mouth.  Muscle cramps.  Dark pee (urine). Pee may be the color of tea.  Your body making less pee.  Your eyes making fewer tears.  Heartbeat that is uneven or faster than normal (palpitations).  Headache.  Light-headedness, especially when you stand up from sitting.  Fainting (syncope). Symptoms of very bad dehydration may include:  Changes in skin, such as: ? Cold and clammy skin. ? Blotchy (mottled) or pale skin. ? Skin that does not quickly return to normal after being lightly pinched and let go (poor skin turgor).  Changes in body fluids, such as: ? Feeling very thirsty. ? Your eyes making fewer tears. ? Not sweating when body temperature is high, such as in hot weather. ? Your body making very little pee.  Changes in vital signs, such as: ? Weak pulse. ? Pulse that is more than 100 beats a minute when you are sitting still. ? Fast breathing. ? Low blood pressure.  Other changes, such as: ? Sunken eyes. ? Cold hands and feet. ? Confusion. ? Lack of energy (lethargy). ? Trouble waking up from sleep. ? Short-term weight loss. ? Unconsciousness. Follow these instructions at home:   If told by your doctor, drink an ORS: ? Make an ORS by using instructions on the package. ? Start by drinking small amounts, about  cup (120 mL) every 5-10 minutes. ? Slowly drink more until you have had the amount that your doctor said to have.  Drink enough clear fluid to keep your pee clear or pale yellow. If you were told to drink an ORS, finish the  ORS first, then start slowly drinking clear fluids. Drink fluids such as: ? Water. Do not drink only water by itself. Doing that can make the salt (sodium) level in your body get too low (hyponatremia). ? Ice chips. ? Fruit juice that you have added water to (diluted). ? Low-calorie sports drinks.  Avoid: ? Alcohol. ? Drinks that have a lot of sugar. These include high-calorie sports drinks, fruit juice that does not have water added, and soda. ? Caffeine. ? Foods that are greasy or have a lot of fat or sugar.  Take over-the-counter and prescription medicines only as told by your doctor.  Do not take salt tablets. Doing that can make the salt level in your body get too high (hypernatremia).  Eat foods that have minerals (electrolytes). Examples include bananas, oranges, potatoes, tomatoes, and spinach.  Keep all follow-up visits as told by your doctor. This is important. Contact a doctor if:  You have belly (abdominal) pain that: ? Gets worse. ? Stays in one area (localizes).  You have a rash.  You have a stiff neck.  You get angry or annoyed more easily than normal (irritability).  You are more sleepy than normal.  You have a harder time waking up than normal.  You feel: ? Weak. ? Dizzy. ? Very thirsty.  You have peed (urinated) only a small amount of very dark pee during 6-8 hours. Get help right away if:  You have   symptoms of very bad dehydration.  You cannot drink fluids without throwing up (vomiting).  Your symptoms get worse with treatment.  You have a fever.  You have a very bad headache.  You are throwing up or having watery poop (diarrhea) and it: ? Gets worse. ? Does not go away.  You have blood or something green (bile) in your throw-up.  You have blood in your poop (stool). This may cause poop to look black and tarry.  You have not peed in 6-8 hours.  You pass out (faint).  Your heart rate when you are sitting still is more than 100 beats a  minute.  You have trouble breathing. This information is not intended to replace advice given to you by your health care provider. Make sure you discuss any questions you have with your health care provider. Document Released: 10/22/2008 Document Revised: 12/08/2016 Document Reviewed: 02/19/2015 Elsevier Patient Education  2020 [employer]  

## 2018-09-13 NOTE — Progress Notes (Signed)
[place] OFFICE PROGRESS NOTE   Diagnosis: Rectal cancer, lung cancer  INTERVAL HISTORY:   Austin Yu returns as scheduled.  He received a cycle of single agent Taxol 09/06/2018.  Carboplatin was held due to thrombocytopenia.  He reports increased fatigue.  Appetite is poor.  He is having pain with swallowing.  He feels lightheaded at times.  No fever.  No bleeding.  Objective:  Vital signs in last 24 hours:  Blood pressure (!) 82/73, pulse (!) 114, temperature 98.2 F (36.8 C), temperature source Temporal, resp. rate 18, weight 168 lb 1.6 oz (76.2 kg), SpO2 92 %.    HEENT: No thrush or ulcers.  Tongue appears mildly dry. Cardio: Regular, tachycardic. GI: Abdomen soft and nontender.  No hepatomegaly. Vascular: No leg edema. Neuro: Alert and oriented. Skin: Decreased skin turgor. Port-A-Cath without erythema.   Lab Results:  Lab Results  Component Value Date   WBC 2.0 (L) 09/13/2018   HGB 11.6 (L) 09/13/2018   HCT 34.1 (L) 09/13/2018   MCV 96.9 09/13/2018   PLT 85 (L) 09/13/2018   NEUTROABS 1.5 (L) 09/13/2018    Imaging:  No results found.  Medications: I have reviewed the patient's current medications.  Assessment/Plan: 1. Rectal cancer ? Distal rectal mass noted on colonoscopy 01/21/2018, biopsy confirmed invasive adenocarcinoma, cytokeratin 20 (patchy) and CDX 2+, cytokeratin 7 and TTF-1 negative. PIK3CA, APC, TP53 detected. ? CTs 01/29/2018-new left infrahilar mass, enlarged left hilar lymph node, mild subcarinal lymphadenopathy, confluent ill-defined lesion in area fibrosis at the posterior left lower lobe measuring 2 x 1.5 cm, rectal mass, perirectal lymph nodes ? MRI 01/28/2018-T3bN1tumor located at 6.6 cm from the internal anal sphincter, 2 mesorectal lymph nodes ? Hypermetabolic left infrahilar mass. Hypermetabolic nodule left lower lobe. Focal hypermetabolic activity within the rectum. ? Bronchoscopy 02/07/2018-no endobronchial lesions or  abnormal secretions seen. Transbronchial brushings obtained from the left lower lobe nodule. Significant enlargement of the station 7, station 10 L, station 11 L nodes. Needle biopsies were taken from station 7 and 11 L nodes. ? Pathology from the left lower lobe brushings, a level 7 lymph node returned positive for adenocarcinoma, positive for cytokeratin 7 and CDX2, negative for cytokeratin 20 and TTF-1. Fine-needle aspiration EBUS, 7, B with malignant cells consistent with adenocarcinoma; CCND1 (amp) detected. ? Cycle 1 FOLFOX 02/21/2018 ? Cycle 2 FOLFOX 03/13/2018,Udenyca added ? Cycle 3 FOLFOX 03/28/2018 ? Cycle 4 FOLFOX 04/18/2018 (oxaliplatin held) ? CT chest 04/19/2018-left infrahilar node previously measured 2.6 x 3.4, current 1.8 x 2.0. 11 mm paraesophageal lymph node, previously 14 mm. ? Cycle 5 FOLFOX 05/02/2018 ? Cycle 6 FOLFOX 05/16/2018 ? CT chest, abdomen, and pelvis on 05/28/2018-stable left lower lobe nodule, decreased left infrahilar mediastinal adenopathy, suspicion of right jugular vein thrombus, decreased perirectal lymph node, stable rectal wall thickening, new right ureteropelvic and distal right ureter stones with hydroureteronephrosis ? Radiation/Xeloda 06/10/2018-07/18/2018 2. Pulmonary fibrosis 3. Kidney stones-new stones with right hydroureteronephrosis on CT 05/27/2018 4. BPH 5. History of gastroesophageal reflux 6. Mild renal insufficiency 7. Port-A-Cath placement 02/14/2018 8. Neutropenia and thrombocytopenia secondary to chemotherapy, Udenyca added with cycle 2 and oxaliplatin dose reduced 9. Tachycardia-chest CT 04/19/2018 negative for pulmonary embolus; underlying fibrotic lung disease again noted; slight increase patchy groundglass attenuation right upper lobe. Echocardiogram 04/23/2018-calculated EF 64%. Tachycardia improved 05/02/2018 10. Cystoscopy/right ureteroscopic stone extraction 06/10/2018 11. 06/04/2018 right upper extremity venous Doppler-acute deep vein  thrombosis involving the right internal jugular veins. Eliquis initiated 12. Non-small cell lung cancer-hypermetabolic left infrahilar  mass, and left lower lobe nodule  Pathology from the left lower lobe brushings, a level 7 lymph node returned positive for adenocarcinoma, positive for cytokeratin 7 and CDX2, negative for cytokeratin 20 and TTF-1. Fine-needle aspiration EBUS, 7, B with malignant cells consistent with adenocarcinoma; CCND1 (amp) detected.  Weekly Taxol/carboplatin radiation beginning 08/05/2018  Chemotherapy delayed beginning 08/19/2018 secondary to neutropenia and thrombocytopenia  Week 3 chemotherapy 08/30/2018 with single agent Taxol  Week 4 chemotherapy 09/06/2018 with single agent Taxol  Disposition: Austin Yu continues radiation.  He is experiencing pain with swallowing, likely radiation esophagitis.  He was prescribed Carafate by Dr. Lisbeth Renshaw earlier today.  He appears dehydrated.  He has mild neutropenia and thrombocytopenia.  We decided to hold today's chemotherapy.  He will receive IV fluids today and tomorrow.  He will return for lab, follow-up and the final cycle of chemotherapy on 09/18/2018.  He will increase fluid intake as tolerated.  He understands to contact the office if he is unable to tolerate fluids.  He also understands to contact the office with fever, chills, other signs of infection, bleeding.  Plan reviewed with Dr. Benay Spice.  25 minutes were spent face-to-face at today's visit with the majority of that time involved in counseling/coordination of care.    [name] ANP/GNP-BC   09/13/2018  12:52 PM

## 2018-09-14 ENCOUNTER — Other Ambulatory Visit: Payer: Self-pay

## 2018-09-14 ENCOUNTER — Inpatient Hospital Stay: Payer: PPO

## 2018-09-14 DIAGNOSIS — C3432 Malignant neoplasm of lower lobe, left bronchus or lung: Secondary | ICD-10-CM

## 2018-09-14 DIAGNOSIS — Z5111 Encounter for antineoplastic chemotherapy: Secondary | ICD-10-CM | POA: Diagnosis not present

## 2018-09-14 MED ORDER — SODIUM CHLORIDE 0.9 % IV SOLN
INTRAVENOUS | Status: DC
  Administered 2018-09-14: 09:00:00 via INTRAVENOUS
  Filled 2018-09-14 (×2): qty 250

## 2018-09-14 MED ORDER — SODIUM CHLORIDE 0.9% FLUSH
10.0000 mL | INTRAVENOUS | Status: DC | PRN
  Administered 2018-09-14: 10 mL via INTRAVENOUS
  Filled 2018-09-14: qty 10

## 2018-09-14 MED ORDER — HEPARIN SOD (PORK) LOCK FLUSH 100 UNIT/ML IV SOLN
500.0000 [IU] | Freq: Once | INTRAVENOUS | Status: AC
  Administered 2018-09-14: 500 [IU] via INTRAVENOUS
  Filled 2018-09-14: qty 5

## 2018-09-14 NOTE — Patient Instructions (Signed)
Dehydration, Adult  Dehydration is a condition in which there is not enough fluid or water in the body. This happens when you lose more fluids than you take in. Important organs, such as the kidneys, brain, and heart, cannot function without a proper amount of fluids. Any loss of fluids from the body can lead to dehydration. Dehydration can range from mild to severe. This condition should be treated right away to prevent it from becoming severe. What are the causes? This condition may be caused by:  Vomiting.  Diarrhea.  Excessive sweating, such as from heat exposure or exercise.  Not drinking enough fluid, especially: ? When ill. ? While doing activity that requires a lot of energy.  Excessive urination.  Fever.  Infection.  Certain medicines, such as medicines that cause the body to lose excess fluid (diuretics).  Inability to access safe drinking water.  Reduced physical ability to get adequate water and food. What increases the risk? This condition is more likely to develop in people:  Who have a poorly controlled long-term (chronic) illness, such as diabetes, heart disease, or kidney disease.  Who are age 69 or older.  Who are disabled.  Who live in a place with high altitude.  Who play endurance sports. What are the signs or symptoms? Symptoms of mild dehydration may include:  Thirst.  Dry lips.  Slightly dry mouth.  Dry, warm skin.  Dizziness. Symptoms of moderate dehydration may include:  Very dry mouth.  Muscle cramps.  Dark urine. Urine may be the color of tea.  Decreased urine production.  Decreased tear production.  Heartbeat that is irregular or faster than normal (palpitations).  Headache.  Light-headedness, especially when you stand up from a sitting position.  Fainting (syncope). Symptoms of severe dehydration may include:  Changes in skin, such as: ? Cold and clammy skin. ? Blotchy (mottled) or pale skin. ? Skin that does  not quickly return to normal after being lightly pinched and released (poor skin turgor).  Changes in body fluids, such as: ? Extreme thirst. ? No tear production. ? Inability to sweat when body temperature is high, such as in hot weather. ? Very little urine production.  Changes in vital signs, such as: ? Weak pulse. ? Pulse that is more than 100 beats a minute when sitting still. ? Rapid breathing. ? Low blood pressure.  Other changes, such as: ? Sunken eyes. ? Cold hands and feet. ? Confusion. ? Lack of energy (lethargy). ? Difficulty waking up from sleep. ? Short-term weight loss. ? Unconsciousness. How is this diagnosed? This condition is diagnosed based on your symptoms and a physical exam. Blood and urine tests may be done to help confirm the diagnosis. How is this treated? Treatment for this condition depends on the severity. Mild or moderate dehydration can often be treated at home. Treatment should be started right away. Do not wait until dehydration becomes severe. Severe dehydration is an emergency and it needs to be treated in a hospital. Treatment for mild dehydration may include:  Drinking more fluids.  Replacing salts and minerals in your blood (electrolytes) that you may have lost. Treatment for moderate dehydration may include:  Drinking an oral rehydration solution (ORS). This is a drink that helps you replace fluids and electrolytes (rehydrate). It can be found at pharmacies and retail stores. Treatment for severe dehydration may include:  Receiving fluids through an IV tube.  Receiving an electrolyte solution through a feeding tube that is passed through your nose and  into your stomach (nasogastric tube, or NG tube).  Correcting any abnormalities in electrolytes.  Treating the underlying cause of dehydration. Follow these instructions at home:  If directed by your health care provider, drink an ORS: ? Make an ORS by following instructions on the  package. ? Start by drinking small amounts, about  cup (120 mL) every 5-10 minutes. ? Slowly increase how much you drink until you have taken the amount recommended by your health care provider.  Drink enough clear fluid to keep your urine clear or pale yellow. If you were told to drink an ORS, finish the ORS first, then start slowly drinking other clear fluids. Drink fluids such as: ? Water. Do not drink only water. Doing that can lead to having too little salt (sodium) in the body (hyponatremia). ? Ice chips. ? Fruit juice that you have added water to (diluted fruit juice). ? Low-calorie sports drinks.  Avoid: ? Alcohol. ? Drinks that contain a lot of sugar. These include high-calorie sports drinks, fruit juice that is not diluted, and soda. ? Caffeine. ? Foods that are greasy or contain a lot of fat or sugar.  Take over-the-counter and prescription medicines only as told by your health care provider.  Do not take sodium tablets. This can lead to having too much sodium in the body (hypernatremia).  Eat foods that contain a healthy balance of electrolytes, such as bananas, oranges, potatoes, tomatoes, and spinach.  Keep all follow-up visits as told by your health care provider. This is important. Contact a health care provider if:  You have abdominal pain that: ? Gets worse. ? Stays in one area (localizes).  You have a rash.  You have a stiff neck.  You are more irritable than usual.  You are sleepier or more difficult to wake up than usual.  You feel weak or dizzy.  You feel very thirsty.  You have urinated only a small amount of very dark urine over 6-8 hours. Get help right away if:  You have symptoms of severe dehydration.  You cannot drink fluids without vomiting.  Your symptoms get worse with treatment.  You have a fever.  You have a severe headache.  You have vomiting or diarrhea that: ? Gets worse. ? Does not go away.  You have blood or green matter  (bile) in your vomit.  You have blood in your stool. This may cause stool to look black and tarry.  You have not urinated in 6-8 hours.  You faint.  Your heart rate while sitting still is over 100 beats a minute.  You have trouble breathing. This information is not intended to replace advice given to you by your health care provider. Make sure you discuss any questions you have with your health care provider. Document Released: 12/26/2004 Document Revised: 12/08/2016 Document Reviewed: 02/19/2015 Elsevier Patient Education  2020 [employer].

## 2018-09-16 ENCOUNTER — Other Ambulatory Visit: Payer: Self-pay | Admitting: Oncology

## 2018-09-16 ENCOUNTER — Ambulatory Visit: Payer: PPO

## 2018-09-16 ENCOUNTER — Other Ambulatory Visit: Payer: Self-pay | Admitting: Internal Medicine

## 2018-09-17 ENCOUNTER — Encounter: Payer: Self-pay | Admitting: Radiation Oncology

## 2018-09-17 ENCOUNTER — Telehealth: Payer: Self-pay | Admitting: Oncology

## 2018-09-17 ENCOUNTER — Other Ambulatory Visit: Payer: Self-pay

## 2018-09-17 ENCOUNTER — Other Ambulatory Visit: Payer: Self-pay | Admitting: Oncology

## 2018-09-17 ENCOUNTER — Ambulatory Visit
Admission: RE | Admit: 2018-09-17 | Discharge: 2018-09-17 | Disposition: A | Discharge status: Home / Self Care | Payer: PPO | Source: Ambulatory Visit | Attending: Radiation Oncology | Admitting: Radiation Oncology

## 2018-09-17 DIAGNOSIS — Z510 Encounter for antineoplastic radiation therapy: Secondary | ICD-10-CM | POA: Diagnosis not present

## 2018-09-17 DIAGNOSIS — C3432 Malignant neoplasm of lower lobe, left bronchus or lung: Secondary | ICD-10-CM | POA: Diagnosis not present

## 2018-09-17 NOTE — Telephone Encounter (Signed)
Called and left msg for patient for 9/9 appt

## 2018-09-18 ENCOUNTER — Inpatient Hospital Stay: Payer: PPO

## 2018-09-18 ENCOUNTER — Ambulatory Visit: Payer: PPO

## 2018-09-18 ENCOUNTER — Encounter: Payer: Self-pay | Admitting: Nurse Practitioner

## 2018-09-18 ENCOUNTER — Telehealth: Payer: Self-pay | Admitting: Nurse Practitioner

## 2018-09-18 ENCOUNTER — Other Ambulatory Visit: Payer: Self-pay

## 2018-09-18 ENCOUNTER — Inpatient Hospital Stay (HOSPITAL_BASED_OUTPATIENT_CLINIC_OR_DEPARTMENT_OTHER): Payer: PPO | Admitting: Nurse Practitioner

## 2018-09-18 VITALS — BP 93/59 | HR 114 | Temp 97.8°F | Resp 20 | Ht 67.0 in | Wt 164.8 lb

## 2018-09-18 VITALS — HR 117

## 2018-09-18 DIAGNOSIS — Z95828 Presence of other vascular implants and grafts: Secondary | ICD-10-CM

## 2018-09-18 DIAGNOSIS — C3432 Malignant neoplasm of lower lobe, left bronchus or lung: Secondary | ICD-10-CM

## 2018-09-18 DIAGNOSIS — C20 Malignant neoplasm of rectum: Secondary | ICD-10-CM

## 2018-09-18 DIAGNOSIS — Z5111 Encounter for antineoplastic chemotherapy: Secondary | ICD-10-CM | POA: Diagnosis not present

## 2018-09-18 LAB — CBC WITH DIFFERENTIAL (CANCER CENTER ONLY)
Abs Immature Granulocytes: 0.02 10*3/uL (ref 0.00–0.07)
Basophils Absolute: 0 10*3/uL (ref 0.0–0.1)
Basophils Relative: 1 %
Eosinophils Absolute: 0.1 10*3/uL (ref 0.0–0.5)
Eosinophils Relative: 2 %
HCT: 35.3 % — ABNORMAL LOW (ref 39.0–52.0)
Hemoglobin: 11.9 g/dL — ABNORMAL LOW (ref 13.0–17.0)
Immature Granulocytes: 1 %
Lymphocytes Relative: 15 %
Lymphs Abs: 0.5 10*3/uL — ABNORMAL LOW (ref 0.7–4.0)
MCH: 32.9 pg (ref 26.0–34.0)
MCHC: 33.7 g/dL (ref 30.0–36.0)
MCV: 97.5 fL (ref 80.0–100.0)
Monocytes Absolute: 0.8 10*3/uL (ref 0.1–1.0)
Monocytes Relative: 26 %
Neutro Abs: 1.8 10*3/uL (ref 1.7–7.7)
Neutrophils Relative %: 55 %
Platelet Count: 135 10*3/uL — ABNORMAL LOW (ref 150–400)
RBC: 3.62 MIL/uL — ABNORMAL LOW (ref 4.22–5.81)
RDW: 14.4 % (ref 11.5–15.5)
WBC Count: 3.3 10*3/uL — ABNORMAL LOW (ref 4.0–10.5)
nRBC: 0 % (ref 0.0–0.2)

## 2018-09-18 LAB — CMP (CANCER CENTER ONLY)
ALT: 15 U/L (ref 0–44)
AST: 29 U/L (ref 15–41)
Albumin: 3.5 g/dL (ref 3.5–5.0)
Alkaline Phosphatase: 59 U/L (ref 38–126)
Anion gap: 14 (ref 5–15)
BUN: 18 mg/dL (ref 8–23)
CO2: 21 mmol/L — ABNORMAL LOW (ref 22–32)
Calcium: 9.2 mg/dL (ref 8.9–10.3)
Chloride: 102 mmol/L (ref 98–111)
Creatinine: 1.31 mg/dL — ABNORMAL HIGH (ref 0.61–1.24)
GFR, Est AFR Am: >60 mL/min (ref >60)
GFR, Estimated: 54 mL/min — ABNORMAL LOW (ref >60)
Glucose, Bld: 114 mg/dL — ABNORMAL HIGH (ref 70–99)
Potassium: 3.5 mmol/L (ref 3.5–5.1)
Sodium: 137 mmol/L (ref 135–145)
Total Bilirubin: 0.7 mg/dL (ref 0.3–1.2)
Total Protein: 7.2 g/dL (ref 6.5–8.1)

## 2018-09-18 MED ORDER — DIPHENHYDRAMINE HCL 50 MG/ML IJ SOLN
25.0000 mg | Freq: Once | INTRAMUSCULAR | Status: AC
  Administered 2018-09-18: 25 mg via INTRAVENOUS

## 2018-09-18 MED ORDER — SODIUM CHLORIDE 0.9% FLUSH
10.0000 mL | Freq: Once | INTRAVENOUS | Status: AC
  Administered 2018-09-18: 10 mL
  Filled 2018-09-18: qty 10

## 2018-09-18 MED ORDER — SODIUM CHLORIDE 0.9 % IV SOLN
125.1000 mg | Freq: Once | INTRAVENOUS | Status: AC
  Administered 2018-09-18: 15:00:00 130 mg via INTRAVENOUS
  Filled 2018-09-18: qty 13

## 2018-09-18 MED ORDER — SODIUM CHLORIDE 0.9 % IV SOLN
Freq: Once | INTRAVENOUS | Status: AC
  Administered 2018-09-18: 13:00:00 via INTRAVENOUS
  Filled 2018-09-18: qty 250

## 2018-09-18 MED ORDER — SODIUM CHLORIDE 0.9% FLUSH
10.0000 mL | INTRAVENOUS | Status: DC | PRN
  Administered 2018-09-18: 10 mL
  Filled 2018-09-18: qty 10

## 2018-09-18 MED ORDER — DIPHENHYDRAMINE HCL 50 MG/ML IJ SOLN
INTRAMUSCULAR | Status: AC
  Filled 2018-09-18: qty 1

## 2018-09-18 MED ORDER — PALONOSETRON HCL INJECTION 0.25 MG/5ML
INTRAVENOUS | Status: AC
  Filled 2018-09-18: qty 5

## 2018-09-18 MED ORDER — PALONOSETRON HCL INJECTION 0.25 MG/5ML
0.2500 mg | Freq: Once | INTRAVENOUS | Status: AC
  Administered 2018-09-18: 0.25 mg via INTRAVENOUS

## 2018-09-18 MED ORDER — SODIUM CHLORIDE 0.9 % IV SOLN
35.0000 mg/m2 | Freq: Once | INTRAVENOUS | Status: AC
  Administered 2018-09-18: 66 mg via INTRAVENOUS
  Filled 2018-09-18: qty 11

## 2018-09-18 MED ORDER — HEPARIN SOD (PORK) LOCK FLUSH 100 UNIT/ML IV SOLN
500.0000 [IU] | Freq: Once | INTRAVENOUS | Status: AC | PRN
  Administered 2018-09-18: 500 [IU]
  Filled 2018-09-18: qty 5

## 2018-09-18 MED ORDER — FAMOTIDINE IN NACL 20-0.9 MG/50ML-% IV SOLN
20.0000 mg | Freq: Once | INTRAVENOUS | Status: AC
  Administered 2018-09-18: 20 mg via INTRAVENOUS

## 2018-09-18 MED ORDER — DEXAMETHASONE SODIUM PHOSPHATE 10 MG/ML IJ SOLN
INTRAMUSCULAR | Status: AC
  Filled 2018-09-18: qty 1

## 2018-09-18 MED ORDER — DEXAMETHASONE SODIUM PHOSPHATE 10 MG/ML IJ SOLN
10.0000 mg | Freq: Once | INTRAMUSCULAR | Status: AC
  Administered 2018-09-18: 10 mg via INTRAVENOUS

## 2018-09-18 MED ORDER — FAMOTIDINE IN NACL 20-0.9 MG/50ML-% IV SOLN
INTRAVENOUS | Status: AC
  Filled 2018-09-18: qty 50

## 2018-09-18 NOTE — Progress Notes (Addendum)
[place] OFFICE PROGRESS NOTE   Diagnosis: Rectal cancer, lung cancer  INTERVAL HISTORY:   Austin Yu returns as scheduled.  He completed week 4 chemotherapy with single agent Taxol on 09/06/2018.  Chemotherapy was held on 09/13/2018 due to mild neutropenia, thrombocytopenia and dehydration.  The dysphagia is better.  He is tolerating Ensure.  He notes that he has a soft stool after drinking Ensure.  No diarrhea.  No nausea or vomiting.  No mouth sores.  Stable neuropathy symptoms.  No fever, cough, shortness of breath.  Objective:  Vital signs in last 24 hours:  Blood pressure (!) 93/59, pulse (!) 130, temperature 97.8 F (36.6 C), temperature source Oral, resp. rate 20, height _0  (1.702 m), weight 164 lb 12.8 oz (74.8 kg), SpO2 93 %.    HEENT: Mild white coating over tongue.  No buccal thrush.  No ulcers. Resp: Rales at both lung bases.  Breath sounds overall diminished.  No respiratory distress. Cardio: Regular, tachycardic. GI: Abdomen soft and nontender.  No hepatomegaly. Vascular: No leg edema. Neuro: Alert and oriented. Skin: No rash. Port-A-Cath without erythema.   Lab Results:  Lab Results  Component Value Date   WBC 3.3 (L) 09/18/2018   HGB 11.9 (L) 09/18/2018   HCT 35.3 (L) 09/18/2018   MCV 97.5 09/18/2018   PLT 135 (L) 09/18/2018   NEUTROABS 1.8 09/18/2018    Imaging:  No results found.  Medications: I have reviewed the patient's current medications.  Assessment/Plan: 1. Rectal cancer ? Distal rectal mass noted on colonoscopy 01/21/2018, biopsy confirmed invasive adenocarcinoma, cytokeratin 20 (patchy) and CDX 2+, cytokeratin 7 and TTF-1 negative. PIK3CA, APC, TP53 detected. ? CTs 01/29/2018-new left infrahilar mass, enlarged left hilar lymph node, mild subcarinal lymphadenopathy, confluent ill-defined lesion in area fibrosis at the posterior left lower lobe measuring 2 x 1.5 cm, rectal mass, perirectal lymph nodes ? MRI  01/28/2018-T3bN1tumor located at 6.6 cm from the internal anal sphincter, 2 mesorectal lymph nodes ? Hypermetabolic left infrahilar mass. Hypermetabolic nodule left lower lobe. Focal hypermetabolic activity within the rectum. ? Bronchoscopy 02/07/2018-no endobronchial lesions or abnormal secretions seen. Transbronchial brushings obtained from the left lower lobe nodule. Significant enlargement of the station 7, station 10 L, station 11 L nodes. Needle biopsies were taken from station 7 and 11 L nodes. ? Pathology from the left lower lobe brushings, a level 7 lymph node returned positive for adenocarcinoma, positive for cytokeratin 7 and CDX2, negative for cytokeratin 20 and TTF-1. Fine-needle aspiration EBUS, 7, B with malignant cells consistent with adenocarcinoma; CCND1 (amp) detected. ? Cycle 1 FOLFOX 02/21/2018 ? Cycle 2 FOLFOX 03/13/2018,Udenyca added ? Cycle 3 FOLFOX 03/28/2018 ? Cycle 4 FOLFOX 04/18/2018 (oxaliplatin held) ? CT chest 04/19/2018-left infrahilar node previously measured 2.6 x 3.4, current 1.8 x 2.0. 11 mm paraesophageal lymph node, previously 14 mm. ? Cycle 5 FOLFOX 05/02/2018 ? Cycle 6 FOLFOX 05/16/2018 ? CT chest, abdomen, and pelvis on 05/28/2018-stable left lower lobe nodule, decreased left infrahilar mediastinal adenopathy, suspicion of right jugular vein thrombus, decreased perirectal lymph node, stable rectal wall thickening, new right ureteropelvic and distal right ureter stones with hydroureteronephrosis ? Radiation/Xeloda 06/10/2018-07/18/2018 2. Pulmonary fibrosis 3. Kidney stones-new stones with right hydroureteronephrosis on CT 05/27/2018 4. BPH 5. History of gastroesophageal reflux 6. Mild renal insufficiency 7. Port-A-Cath placement 02/14/2018 8. Neutropenia and thrombocytopenia secondary to chemotherapy, Udenyca added with cycle 2 and oxaliplatin dose reduced 9. Tachycardia-chest CT 04/19/2018 negative for pulmonary embolus; underlying fibrotic lung disease again  noted;  slight increase patchy groundglass attenuation right upper lobe. Echocardiogram 04/23/2018-calculated EF 64%. Tachycardia improved 05/02/2018 10. Cystoscopy/right ureteroscopic stone extraction 06/10/2018 11. 06/04/2018 right upper extremity venous Doppler-acute deep vein thrombosis involving the right internal jugular veins. Eliquis initiated 12. Non-small cell lung cancer-hypermetabolic left infrahilar mass, and left lower lobe nodule  Pathology from the left lower lobe brushings, a level 7 lymph node returned positive for adenocarcinoma, positive for cytokeratin 7 and CDX2, negative for cytokeratin 20 and TTF-1. Fine-needle aspiration EBUS, 7, B with malignant cells consistent with adenocarcinoma; CCND1 (amp) detected.  Weekly Taxol/carboplatin radiation beginning 08/05/2018  Chemotherapy delayed beginning 08/19/2018 secondary to neutropenia and thrombocytopenia  Week 3 chemotherapy 08/30/2018 with single agent Taxol  Week 4 chemotherapy 09/06/2018 with single agent Taxol  Week 5 chemotherapy 09/18/2018 with Taxol/carboplatin   Disposition: [name] appears stable.  He completed the course of radiation yesterday.  Plan to proceed with the fifth and final cycle of weekly chemotherapy with Taxol/carboplatin today as scheduled.  The chemotherapy has been dose reduced.  We reviewed the CBC from today.  The white count and platelets are better.  Counts are adequate to proceed with treatment as above.  He will return for lab and follow-up in approximately 2 weeks.  He will contact the office in the interim with any problems.  Patient seen with Dr. Benay Spice.    [name] ANP/GNP-BC   09/18/2018  11:42 AM  This was a shared visit with [name].  Austin Yu will complete a final cycle of chemotherapy today.  He will return for an office and lab visit in 2 weeks.  Julieanne Manson, MD

## 2018-09-18 NOTE — Telephone Encounter (Signed)
Scheduled per los. Gave avs and calendar  

## 2018-09-18 NOTE — Progress Notes (Signed)
  Radiation Oncology         [phone]) [phone] ________________________________  Name: Austin Yu MRN: [id number]  Date: 07/18/2018  DOB: 05-Nov-1945  End of Treatment Note  Diagnosis:   Rectal cancer    Cancer Staging Rectal cancer [facility]) Staging form: Colon and Rectum, AJCC 8th Edition - Clinical: Stage Unknown (cTX, cNX, cM1) - Signed by Ladell Pier, MD on 02/12/2018   Indication for treatment:  Curative       Radiation treatment dates:   06/10/2018 through 07/18/2018  Site/dose:    The patient was treated to the pelvis to a dose of 45 Gy at 1.8 Gy per fraction. This was accomplished using a 4 field 3-D conformal technique. The patient then received a boost to the tumor and adjacent high-risk regions for an additional 5.4 Gy at 1.8 gray per fraction. This was carried out using a coned-down 4 field approach. The patient's total dose was 50.4 Gy. Daily AlignRT was used on a daily basis to insure proper patient positioning and localization of critical targets/ structures. The patient received concurrent chemotherapy during the course of radiation treatment.  Narrative: The patient tolerated radiation treatment relatively well.   He was able to complete his full prescribed course of radiation treatment without major unexpected difficulties  Plan: The patient has completed radiation treatment. The patient will return to radiation oncology clinic for routine followup in one month. I advised the patient to call or return sooner if they have any questions or concerns related to their recovery or treatment.   ------------------------------------------------  Jodelle Gross, MD, PhD

## 2018-09-18 NOTE — Patient Instructions (Signed)

## 2018-09-18 NOTE — Progress Notes (Signed)
Per [name], NP okay to treat with HR 117.

## 2018-09-18 NOTE — Patient Instructions (Signed)
[place] Discharge Instructions for Patients Receiving Chemotherapy  Today you received the following chemotherapy agents: Taxol/Carbo.  To help prevent nausea and vomiting after your treatment, we encourage you to take your nausea medication as directed.   If you develop nausea and vomiting that is not controlled by your nausea medication, call the clinic.   BELOW ARE SYMPTOMS THAT SHOULD BE REPORTED IMMEDIATELY:  *FEVER GREATER THAN 100.5 F  *CHILLS WITH OR WITHOUT FEVER  NAUSEA AND VOMITING THAT IS NOT CONTROLLED WITH YOUR NAUSEA MEDICATION  *UNUSUAL SHORTNESS OF BREATH  *UNUSUAL BRUISING OR BLEEDING  TENDERNESS IN MOUTH AND THROAT WITH OR WITHOUT PRESENCE OF ULCERS  *URINARY PROBLEMS  *BOWEL PROBLEMS  UNUSUAL RASH Items with * indicate a potential emergency and should be followed up as soon as possible.  Feel free to call the clinic should you have any questions or concerns. The clinic phone number is [phone].  Please show the [place] at check-in to the Emergency Department and triage nurse.

## 2018-09-19 ENCOUNTER — Ambulatory Visit: Payer: PPO

## 2018-09-20 ENCOUNTER — Ambulatory Visit: Payer: PPO

## 2018-09-20 ENCOUNTER — Other Ambulatory Visit: Payer: PPO

## 2018-10-01 ENCOUNTER — Telehealth: Payer: Self-pay | Admitting: Nurse Practitioner

## 2018-10-01 ENCOUNTER — Inpatient Hospital Stay: Payer: PPO

## 2018-10-01 ENCOUNTER — Inpatient Hospital Stay: Payer: PPO | Admitting: Nurse Practitioner

## 2018-10-01 NOTE — Telephone Encounter (Signed)
Called pt per 9/22 sch message - no answer . Left message for patient to call back to reschedule appt from today

## 2018-10-08 ENCOUNTER — Encounter: Payer: Self-pay | Admitting: Nurse Practitioner

## 2018-10-08 ENCOUNTER — Emergency Department (HOSPITAL_COMMUNITY): Payer: PPO

## 2018-10-08 ENCOUNTER — Other Ambulatory Visit: Payer: Self-pay

## 2018-10-08 ENCOUNTER — Inpatient Hospital Stay (HOSPITAL_COMMUNITY)
Admission: EM | Admit: 2018-10-08 | Discharge: 2018-11-10 | DRG: 196 | Disposition: E | Payer: PPO | Source: Ambulatory Visit | Attending: Internal Medicine | Admitting: Internal Medicine

## 2018-10-08 ENCOUNTER — Ambulatory Visit (HOSPITAL_COMMUNITY)
Admission: RE | Admit: 2018-10-08 | Discharge: 2018-10-08 | Disposition: A | Discharge status: Home / Self Care | Payer: PPO | Source: Ambulatory Visit | Attending: Nurse Practitioner | Admitting: Nurse Practitioner

## 2018-10-08 ENCOUNTER — Inpatient Hospital Stay: Payer: PPO

## 2018-10-08 ENCOUNTER — Inpatient Hospital Stay (HOSPITAL_BASED_OUTPATIENT_CLINIC_OR_DEPARTMENT_OTHER): Payer: PPO | Admitting: Nurse Practitioner

## 2018-10-08 VITALS — BP 99/70 | HR 129 | Temp 98.3°F | Resp 25 | Ht 67.0 in | Wt 158.5 lb

## 2018-10-08 DIAGNOSIS — A419 Sepsis, unspecified organism: Secondary | ICD-10-CM | POA: Diagnosis present

## 2018-10-08 DIAGNOSIS — J189 Pneumonia, unspecified organism: Secondary | ICD-10-CM | POA: Diagnosis present

## 2018-10-08 DIAGNOSIS — I959 Hypotension, unspecified: Secondary | ICD-10-CM | POA: Diagnosis not present

## 2018-10-08 DIAGNOSIS — Z87442 Personal history of urinary calculi: Secondary | ICD-10-CM

## 2018-10-08 DIAGNOSIS — R0600 Dyspnea, unspecified: Secondary | ICD-10-CM | POA: Diagnosis not present

## 2018-10-08 DIAGNOSIS — Z86718 Personal history of other venous thrombosis and embolism: Secondary | ICD-10-CM | POA: Diagnosis not present

## 2018-10-08 DIAGNOSIS — J700 Acute pulmonary manifestations due to radiation: Secondary | ICD-10-CM | POA: Diagnosis present

## 2018-10-08 DIAGNOSIS — Y842 Radiological procedure and radiotherapy as the cause of abnormal reaction of the patient, or of later complication, without mention of misadventure at the time of the procedure: Secondary | ICD-10-CM | POA: Diagnosis present

## 2018-10-08 DIAGNOSIS — Z79899 Other long term (current) drug therapy: Secondary | ICD-10-CM | POA: Diagnosis not present

## 2018-10-08 DIAGNOSIS — F419 Anxiety disorder, unspecified: Secondary | ICD-10-CM | POA: Diagnosis present

## 2018-10-08 DIAGNOSIS — K219 Gastro-esophageal reflux disease without esophagitis: Secondary | ICD-10-CM | POA: Diagnosis present

## 2018-10-08 DIAGNOSIS — E785 Hyperlipidemia, unspecified: Secondary | ICD-10-CM | POA: Diagnosis not present

## 2018-10-08 DIAGNOSIS — T501X5A Adverse effect of loop [high-ceiling] diuretics, initial encounter: Secondary | ICD-10-CM | POA: Diagnosis not present

## 2018-10-08 DIAGNOSIS — J84112 Idiopathic pulmonary fibrosis: Principal | ICD-10-CM | POA: Diagnosis present

## 2018-10-08 DIAGNOSIS — R0602 Shortness of breath: Secondary | ICD-10-CM | POA: Diagnosis not present

## 2018-10-08 DIAGNOSIS — E870 Hyperosmolality and hypernatremia: Secondary | ICD-10-CM | POA: Diagnosis not present

## 2018-10-08 DIAGNOSIS — Z8249 Family history of ischemic heart disease and other diseases of the circulatory system: Secondary | ICD-10-CM

## 2018-10-08 DIAGNOSIS — Z66 Do not resuscitate: Secondary | ICD-10-CM | POA: Diagnosis not present

## 2018-10-08 DIAGNOSIS — R451 Restlessness and agitation: Secondary | ICD-10-CM | POA: Diagnosis not present

## 2018-10-08 DIAGNOSIS — E873 Alkalosis: Secondary | ICD-10-CM | POA: Diagnosis not present

## 2018-10-08 DIAGNOSIS — N400 Enlarged prostate without lower urinary tract symptoms: Secondary | ICD-10-CM | POA: Diagnosis not present

## 2018-10-08 DIAGNOSIS — Z20828 Contact with and (suspected) exposure to other viral communicable diseases: Secondary | ICD-10-CM | POA: Diagnosis not present

## 2018-10-08 DIAGNOSIS — C20 Malignant neoplasm of rectum: Secondary | ICD-10-CM | POA: Insufficient documentation

## 2018-10-08 DIAGNOSIS — Z9221 Personal history of antineoplastic chemotherapy: Secondary | ICD-10-CM

## 2018-10-08 DIAGNOSIS — C3432 Malignant neoplasm of lower lobe, left bronchus or lung: Secondary | ICD-10-CM | POA: Diagnosis present

## 2018-10-08 DIAGNOSIS — D61810 Antineoplastic chemotherapy induced pancytopenia: Secondary | ICD-10-CM | POA: Diagnosis present

## 2018-10-08 DIAGNOSIS — Z515 Encounter for palliative care: Secondary | ICD-10-CM | POA: Diagnosis not present

## 2018-10-08 DIAGNOSIS — I371 Nonrheumatic pulmonary valve insufficiency: Secondary | ICD-10-CM | POA: Diagnosis not present

## 2018-10-08 DIAGNOSIS — Z923 Personal history of irradiation: Secondary | ICD-10-CM

## 2018-10-08 DIAGNOSIS — J9601 Acute respiratory failure with hypoxia: Secondary | ICD-10-CM | POA: Diagnosis present

## 2018-10-08 DIAGNOSIS — Z7901 Long term (current) use of anticoagulants: Secondary | ICD-10-CM

## 2018-10-08 DIAGNOSIS — K449 Diaphragmatic hernia without obstruction or gangrene: Secondary | ICD-10-CM | POA: Diagnosis present

## 2018-10-08 DIAGNOSIS — R05 Cough: Secondary | ICD-10-CM | POA: Diagnosis not present

## 2018-10-08 DIAGNOSIS — Z95828 Presence of other vascular implants and grafts: Secondary | ICD-10-CM

## 2018-10-08 LAB — CEA (IN HOUSE-CHCC): CEA (CHCC-In House): 28.27 ng/mL — ABNORMAL HIGH (ref 0.00–5.00)

## 2018-10-08 LAB — CMP (CANCER CENTER ONLY)
ALT: 15 U/L (ref 0–44)
AST: 31 U/L (ref 15–41)
Albumin: 3 g/dL — ABNORMAL LOW (ref 3.5–5.0)
Alkaline Phosphatase: 65 U/L (ref 38–126)
Anion gap: 11 (ref 5–15)
BUN: 16 mg/dL (ref 8–23)
CO2: 24 mmol/L (ref 22–32)
Calcium: 9.3 mg/dL (ref 8.9–10.3)
Chloride: 103 mmol/L (ref 98–111)
Creatinine: 1.15 mg/dL (ref 0.61–1.24)
GFR, Est AFR Am: >60 mL/min (ref >60)
GFR, Estimated: >60 mL/min (ref >60)
Glucose, Bld: 153 mg/dL — ABNORMAL HIGH (ref 70–99)
Potassium: 4 mmol/L (ref 3.5–5.1)
Sodium: 138 mmol/L (ref 135–145)
Total Bilirubin: 0.6 mg/dL (ref 0.3–1.2)
Total Protein: 7.2 g/dL (ref 6.5–8.1)

## 2018-10-08 LAB — TYPE AND SCREEN
ABO/RH(D): O POS
Antibody Screen: NEGATIVE

## 2018-10-08 LAB — CBC WITH DIFFERENTIAL/PLATELET
Abs Immature Granulocytes: 0.02 10*3/uL (ref 0.00–0.07)
Basophils Absolute: 0.1 10*3/uL (ref 0.0–0.1)
Basophils Relative: 1 %
Eosinophils Absolute: 0.1 10*3/uL (ref 0.0–0.5)
Eosinophils Relative: 2 %
HCT: 41.3 % (ref 39.0–52.0)
Hemoglobin: 13.1 g/dL (ref 13.0–17.0)
Immature Granulocytes: 0 %
Lymphocytes Relative: 11 %
Lymphs Abs: 0.8 10*3/uL (ref 0.7–4.0)
MCH: 32.1 pg (ref 26.0–34.0)
MCHC: 31.7 g/dL (ref 30.0–36.0)
MCV: 101.2 fL — ABNORMAL HIGH (ref 80.0–100.0)
Monocytes Absolute: 0.7 10*3/uL (ref 0.1–1.0)
Monocytes Relative: 10 %
Neutro Abs: 5.5 10*3/uL (ref 1.7–7.7)
Neutrophils Relative %: 76 %
Platelets: 137 10*3/uL — ABNORMAL LOW (ref 150–400)
RBC: 4.08 MIL/uL — ABNORMAL LOW (ref 4.22–5.81)
RDW: 16.8 % — ABNORMAL HIGH (ref 11.5–15.5)
WBC: 7.3 10*3/uL (ref 4.0–10.5)
nRBC: 0 % (ref 0.0–0.2)

## 2018-10-08 LAB — CBC WITH DIFFERENTIAL (CANCER CENTER ONLY)
Abs Immature Granulocytes: 0.03 10*3/uL (ref 0.00–0.07)
Basophils Absolute: 0.1 10*3/uL (ref 0.0–0.1)
Basophils Relative: 1 %
Eosinophils Absolute: 0.2 10*3/uL (ref 0.0–0.5)
Eosinophils Relative: 2 %
HCT: 37.3 % — ABNORMAL LOW (ref 39.0–52.0)
Hemoglobin: 12.2 g/dL — ABNORMAL LOW (ref 13.0–17.0)
Immature Granulocytes: 0 %
Lymphocytes Relative: 12 %
Lymphs Abs: 0.8 10*3/uL (ref 0.7–4.0)
MCH: 32.3 pg (ref 26.0–34.0)
MCHC: 32.7 g/dL (ref 30.0–36.0)
MCV: 98.7 fL (ref 80.0–100.0)
Monocytes Absolute: 0.7 10*3/uL (ref 0.1–1.0)
Monocytes Relative: 11 %
Neutro Abs: 5 10*3/uL (ref 1.7–7.7)
Neutrophils Relative %: 74 %
Platelet Count: 153 10*3/uL (ref 150–400)
RBC: 3.78 MIL/uL — ABNORMAL LOW (ref 4.22–5.81)
RDW: 16.5 % — ABNORMAL HIGH (ref 11.5–15.5)
WBC Count: 6.7 10*3/uL (ref 4.0–10.5)
nRBC: 0 % (ref 0.0–0.2)

## 2018-10-08 LAB — BASIC METABOLIC PANEL
Anion gap: 11 (ref 5–15)
BUN: 17 mg/dL (ref 8–23)
CO2: 23 mmol/L (ref 22–32)
Calcium: 9.1 mg/dL (ref 8.9–10.3)
Chloride: 101 mmol/L (ref 98–111)
Creatinine, Ser: 1.21 mg/dL (ref 0.61–1.24)
GFR calc Af Amer: >60 mL/min (ref >60)
GFR calc non Af Amer: 60 mL/min — ABNORMAL LOW (ref >60)
Glucose, Bld: 120 mg/dL — ABNORMAL HIGH (ref 70–99)
Potassium: 5.1 mmol/L (ref 3.5–5.1)
Sodium: 135 mmol/L (ref 135–145)

## 2018-10-08 LAB — TROPONIN I (HIGH SENSITIVITY)
Troponin I (High Sensitivity): 9 ng/L (ref <18)
Troponin I (High Sensitivity): 9 ng/L (ref <18)

## 2018-10-08 LAB — BRAIN NATRIURETIC PEPTIDE: B Natriuretic Peptide: 41.2 pg/mL (ref 0.0–100.0)

## 2018-10-08 LAB — SARS CORONAVIRUS 2 BY RT PCR (HOSPITAL ORDER, PERFORMED IN ~~LOC~~ HOSPITAL LAB): SARS Coronavirus 2: NEGATIVE

## 2018-10-08 MED ORDER — IOHEXOL 350 MG/ML SOLN
100.0000 mL | Freq: Once | INTRAVENOUS | Status: AC | PRN
  Administered 2018-10-08: 20:00:00 100 mL via INTRAVENOUS

## 2018-10-08 MED ORDER — SODIUM CHLORIDE 0.9% FLUSH
10.0000 mL | Freq: Once | INTRAVENOUS | Status: AC
  Administered 2018-10-08: 15:00:00 10 mL
  Filled 2018-10-08: qty 10

## 2018-10-08 MED ORDER — HEPARIN SOD (PORK) LOCK FLUSH 100 UNIT/ML IV SOLN
500.0000 [IU] | Freq: Once | INTRAVENOUS | Status: AC
  Administered 2018-10-08: 15:00:00 500 [IU]
  Filled 2018-10-08: qty 5

## 2018-10-08 MED ORDER — SODIUM CHLORIDE 0.9 % IV SOLN
2.0000 g | Freq: Two times a day (BID) | INTRAVENOUS | Status: DC
  Administered 2018-10-09: 06:00:00 2 g via INTRAVENOUS
  Filled 2018-10-08: qty 2

## 2018-10-08 MED ORDER — SODIUM CHLORIDE 0.9 % IV SOLN
INTRAVENOUS | Status: AC
  Administered 2018-10-08: 23:00:00 via INTRAVENOUS

## 2018-10-08 MED ORDER — APIXABAN 2.5 MG PO TABS
2.5000 mg | ORAL_TABLET | Freq: Every day | ORAL | Status: DC
  Administered 2018-10-09 – 2018-10-15 (×7): 2.5 mg via ORAL
  Filled 2018-10-08 (×8): qty 1

## 2018-10-08 MED ORDER — VANCOMYCIN HCL 10 G IV SOLR
1750.0000 mg | Freq: Once | INTRAVENOUS | Status: AC
  Administered 2018-10-09: 1750 mg via INTRAVENOUS
  Filled 2018-10-08: qty 1750

## 2018-10-08 MED ORDER — SODIUM CHLORIDE 0.9 % IV SOLN
1.0000 g | Freq: Once | INTRAVENOUS | Status: AC
  Administered 2018-10-08: 1 g via INTRAVENOUS
  Filled 2018-10-08: qty 10

## 2018-10-08 MED ORDER — SODIUM CHLORIDE 0.9 % IV SOLN
500.0000 mg | Freq: Once | INTRAVENOUS | Status: AC
  Administered 2018-10-08: 23:00:00 500 mg via INTRAVENOUS
  Filled 2018-10-08: qty 500

## 2018-10-08 MED ORDER — SODIUM CHLORIDE (PF) 0.9 % IJ SOLN
INTRAMUSCULAR | Status: AC
  Administered 2018-10-08: 10 mL
  Filled 2018-10-08: qty 50

## 2018-10-08 MED ORDER — SODIUM CHLORIDE 0.9 % IV SOLN
INTRAVENOUS | Status: DC
  Administered 2018-10-08: 16:00:00 via INTRAVENOUS
  Filled 2018-10-08 (×2): qty 250

## 2018-10-08 NOTE — ED Triage Notes (Signed)
Pt from home.  Pt came to [facility] center for fluids.  Pt satting 70's RA.  Pt sent to ED. Chest x ray done at [facility] center.  Pt on 2 L [place] satting 87% on arrival to ED.

## 2018-10-08 NOTE — Progress Notes (Addendum)
[facility] OFFICE PROGRESS NOTE   Diagnosis: Rectal cancer, lung cancer  INTERVAL HISTORY:   [name] returns for follow-up.  He completed radiation 09/17/2018.  He completed a final cycle of carboplatin/Taxol 09/18/2018.  For the past 2 to 3 weeks he has noted decreased appetite, decreased energy and increased shortness of breath.  No cough or fever.  No sweats or chills.  Objective:  Vital signs in last 24 hours:  Blood pressure 99/70, pulse (!) 129, temperature 98.3 F (36.8 C), resp. rate (!) 25, height '5\' 7"'  (1.702 m), weight 158 lb 8 oz (71.9 kg), SpO2 93 %.    Resp: Rales at the lower lung fields bilaterally.  Increased respiratory rate.  No respiratory distress. Cardio: Regular, tachycardic GI: Abdomen soft and nontender.  No hepatomegaly. Vascular: No leg edema. Neuro: Alert and oriented. Port-A-Cath without erythema.  Lab Results:  Lab Results  Component Value Date   WBC 6.7 10/02/2018   HGB 12.2 (L) 10/06/2018   HCT 37.3 (L) 09/14/2018   MCV 98.7 09/13/2018   PLT 153 10/09/2018   NEUTROABS 5.0 10/02/2018    Imaging:  No results found.  Medications: I have reviewed the patient's current medications.  Assessment/Plan: 1. Rectal cancer ? Distal rectal mass noted on colonoscopy 01/21/2018, biopsy confirmed invasive adenocarcinoma, cytokeratin 20 (patchy) and CDX 2+, cytokeratin 7 and TTF-1 negative. PIK3CA, APC, TP53 detected. ? CTs 01/29/2018-new left infrahilar mass, enlarged left hilar lymph node, mild subcarinal lymphadenopathy, confluent ill-defined lesion in area fibrosis at the posterior left lower lobe measuring 2 x 1.5 cm, rectal mass, perirectal lymph nodes ? MRI 01/28/2018-[id number] located at 6.6 cm from the internal anal sphincter, 2 mesorectal lymph nodes ? Hypermetabolic left infrahilar mass. Hypermetabolic nodule left lower lobe. Focal hypermetabolic activity within the rectum. ? Bronchoscopy 02/07/2018-no endobronchial lesions  or abnormal secretions seen. Transbronchial brushings obtained from the left lower lobe nodule. Significant enlargement of the station 7, station 10 L, station 11 L nodes. Needle biopsies were taken from station 7 and 11 L nodes. ? Pathology from the left lower lobe brushings, a level 7 lymph node returned positive for adenocarcinoma, positive for cytokeratin 7 and CDX2, negative for cytokeratin 20 and TTF-1. Fine-needle aspiration EBUS, 7, B with malignant cells consistent with adenocarcinoma; CCND1 (amp) detected. ? Cycle 1 FOLFOX 02/21/2018 ? Cycle 2 FOLFOX 03/13/2018,Udenyca added ? Cycle 3 FOLFOX 03/28/2018 ? Cycle 4 FOLFOX 04/18/2018 (oxaliplatin held) ? CT chest 04/19/2018-left infrahilar node previously measured 2.6 x 3.4, current 1.8 x 2.0. 11 mm paraesophageal lymph node, previously 14 mm. ? Cycle 5 FOLFOX 05/02/2018 ? Cycle 6 FOLFOX 05/16/2018 ? CT chest, abdomen, and pelvis on 05/28/2018-stable left lower lobe nodule, decreased left infrahilar mediastinal adenopathy, suspicion of right jugular vein thrombus, decreased perirectal lymph node, stable rectal wall thickening, new right ureteropelvic and distal right ureter stones with hydroureteronephrosis ? Radiation/Xeloda 06/10/2018-07/18/2018 2. Pulmonary fibrosis 3. Kidney stones-new stones with right hydroureteronephrosis on CT 05/27/2018 4. BPH 5. History of gastroesophageal reflux 6. Mild renal insufficiency 7. Port-A-Cath placement 02/14/2018 8. Neutropenia and thrombocytopenia secondary to chemotherapy, Udenyca added with cycle 2 and oxaliplatin dose reduced 9. Tachycardia-chest CT 04/19/2018 negative for pulmonary embolus; underlying fibrotic lung disease again noted; slight increase patchy groundglass attenuation right upper lobe. Echocardiogram 04/23/2018-calculated EF 64%. Tachycardia improved 05/02/2018 10. Cystoscopy/right ureteroscopic stone extraction 06/10/2018 11. 06/04/2018 right upper extremity venous Doppler-acute deep vein  thrombosis involving the right internal jugular veins. Eliquis initiated 12. Non-small cell lung cancer-hypermetabolic left infrahilar mass,  and left lower lobe nodule  Pathology from the left lower lobe brushings, a level 7 lymph node returned positive for adenocarcinoma, positive for cytokeratin 7 and CDX2, negative for cytokeratin 20 and TTF-1. Fine-needle aspiration EBUS, 7, B with malignant cells consistent with adenocarcinoma; CCND1 (amp) detected.  Weekly Taxol/carboplatin radiation beginning 08/05/2018  Chemotherapy delayed beginning 08/19/2018 secondary to neutropenia and thrombocytopenia  Week 3 chemotherapy 08/30/2018 with single agent Taxol  Week 4 chemotherapy 09/06/2018 with single agent Taxol  Radiation completed 09/17/2018  Week 5 chemotherapy 09/18/2018 with Taxol/carboplatin   Disposition: [name] is approximately 3 weeks out from completing radiation and Taxol/carboplatin chemotherapy.  He has a poor performance status.  He is experiencing more dyspnea than typical.  He has significant tachycardia, increased from baseline.  He was hypoxic upon arrival to the office.  Oxygen saturation increased to 100% on 2 L of oxygen.  Upon removal of oxygen the saturation slowly decreased to 89%.  We discussed the possibility of radiation pneumonitis.  Chest x-ray showed chronic fibrotic and postradiation changes with superimposed right basilar atelectasis.  He returned to the office for IV fluids due to concern he may also be dehydrated.  Oxygen saturation was 74% on room air.  He was again placed on 2 L of oxygen with improvement into the 90s.  He does not have home oxygen.  We are referring him to the emergency department for evaluation of hypoxia, tachycardia.  Patient seen with Dr. Benay Spice.  25 minutes were spent face-to-face at today's visit with the majority of that time involved in counseling/coordination of care.    [name] ANP/GNP-BC   09/13/2018  3:57 PM  This was  a shared visit with [name].  [name] was interviewed and examined.  He presents today with malaise, weight loss, and dyspnea.  He is hypoxic.  He does not have a fever or cough.  I have a low suspicion for infection, but this is.  He could have radiation pneumonitis or progression of pulmonary fibrosis.  He will be referred to the emergency room for further evaluation and probable admission.  Julieanne Manson, MD

## 2018-10-08 NOTE — Patient Instructions (Signed)
Dehydration, Adult  Dehydration is when there is not enough fluid or water in your body. This happens when you lose more fluids than you take in. Dehydration can range from mild to very bad. It should be treated right away to keep it from getting very bad. Symptoms of mild dehydration may include:  Thirst.  Dry lips.  Slightly dry mouth.  Dry, warm skin.  Dizziness. Symptoms of moderate dehydration may include:  Very dry mouth.  Muscle cramps.  Dark pee (urine). Pee may be the color of tea.  Your body making less pee.  Your eyes making fewer tears.  Heartbeat that is uneven or faster than normal (palpitations).  Headache.  Light-headedness, especially when you stand up from sitting.  Fainting (syncope). Symptoms of very bad dehydration may include:  Changes in skin, such as: ? Cold and clammy skin. ? Blotchy (mottled) or pale skin. ? Skin that does not quickly return to normal after being lightly pinched and let go (poor skin turgor).  Changes in body fluids, such as: ? Feeling very thirsty. ? Your eyes making fewer tears. ? Not sweating when body temperature is high, such as in hot weather. ? Your body making very little pee.  Changes in vital signs, such as: ? Weak pulse. ? Pulse that is more than 100 beats a minute when you are sitting still. ? Fast breathing. ? Low blood pressure.  Other changes, such as: ? Sunken eyes. ? Cold hands and feet. ? Confusion. ? Lack of energy (lethargy). ? Trouble waking up from sleep. ? Short-term weight loss. ? Unconsciousness. Follow these instructions at home:   If told by your doctor, drink an ORS: ? Make an ORS by using instructions on the package. ? Start by drinking small amounts, about  cup (120 mL) every 5-10 minutes. ? Slowly drink more until you have had the amount that your doctor said to have.  Drink enough clear fluid to keep your pee clear or pale yellow. If you were told to drink an ORS, finish the  ORS first, then start slowly drinking clear fluids. Drink fluids such as: ? Water. Do not drink only water by itself. Doing that can make the salt (sodium) level in your body get too low (hyponatremia). ? Ice chips. ? Fruit juice that you have added water to (diluted). ? Low-calorie sports drinks.  Avoid: ? Alcohol. ? Drinks that have a lot of sugar. These include high-calorie sports drinks, fruit juice that does not have water added, and soda. ? Caffeine. ? Foods that are greasy or have a lot of fat or sugar.  Take over-the-counter and prescription medicines only as told by your doctor.  Do not take salt tablets. Doing that can make the salt level in your body get too high (hypernatremia).  Eat foods that have minerals (electrolytes). Examples include bananas, oranges, potatoes, tomatoes, and spinach.  Keep all follow-up visits as told by your doctor. This is important. Contact a doctor if:  You have belly (abdominal) pain that: ? Gets worse. ? Stays in one area (localizes).  You have a rash.  You have a stiff neck.  You get angry or annoyed more easily than normal (irritability).  You are more sleepy than normal.  You have a harder time waking up than normal.  You feel: ? Weak. ? Dizzy. ? Very thirsty.  You have peed (urinated) only a small amount of very dark pee during 6-8 hours. Get help right away if:  You have  symptoms of very bad dehydration.  You cannot drink fluids without throwing up (vomiting).  Your symptoms get worse with treatment.  You have a fever.  You have a very bad headache.  You are throwing up or having watery poop (diarrhea) and it: ? Gets worse. ? Does not go away.  You have blood or something green (bile) in your throw-up.  You have blood in your poop (stool). This may cause poop to look black and tarry.  You have not peed in 6-8 hours.  You pass out (faint).  Your heart rate when you are sitting still is more than 100 beats a  minute.  You have trouble breathing. This information is not intended to replace advice given to you by your health care provider. Make sure you discuss any questions you have with your health care provider. Document Released: 10/22/2008 Document Revised: 12/08/2016 Document Reviewed: 02/19/2015 Elsevier Patient Education  2020 [employer]Coronavirus (COVID-19) Are you at risk?  Are you at risk for the Coronavirus (COVID-19)?  To be considered HIGH RISK for Coronavirus (COVID-19), you have to meet the following criteria:   Traveled to [place], [place], [place], [place] or [place]; or in the [place] to [place], [place], [place], or [place]; and have fever, cough, and shortness of breath within the last 2 weeks of travel OR  Been in close contact with a person diagnosed with COVID-19 within the last 2 weeks and have fever, cough, and shortness of breath  IF YOU DO NOT MEET THESE CRITERIA, YOU ARE CONSIDERED LOW RISK FOR COVID-19.  What to do if you are HIGH RISK for COVID-19?   If you are having a medical emergency, call 911.  Seek medical care right away. Before you go to a doctors office, urgent care or emergency department, call ahead and tell them about your recent travel, contact with someone diagnosed with COVID-19, and your symptoms. You should receive instructions from your physicians office regarding next steps of care.   When you arrive at healthcare provider, tell the healthcare staff immediately you have returned from visiting [place], [place], [place], [place] or [place]; or traveled in the [place] to [place], [place], [place], or [place]; in the last two weeks or you have been in close contact with a person diagnosed with COVID-19 in the last 2 weeks.    Tell the health care staff about your symptoms: fever, cough and shortness of breath.  After you have been seen by a medical provider, you will be either: o Tested for (COVID-19) and discharged  home on quarantine except to seek medical care if symptoms worsen, and asked to  - Stay home and avoid contact with others until you get your results (4-5 days)  - Avoid travel on public transportation if possible (such as bus, train, or airplane) or o Sent to the Emergency Department by EMS for evaluation, COVID-19 testing, and possible admission depending on your condition and test results.  What to do if you are LOW RISK for COVID-19?  Reduce your risk of any infection by using the same precautions used for avoiding the common cold or flu:   Wash your hands often with soap and warm water for at least 20 seconds.  If soap and water are not readily available, use an alcohol-based hand sanitizer with at least 60% alcohol.   If coughing or sneezing, cover your mouth and nose by coughing or sneezing into the elbow areas of your shirt or  coat, into a tissue or into your sleeve (not your hands).  Avoid shaking hands with others and consider head nods or verbal greetings only.  Avoid touching your eyes, nose, or mouth with unwashed hands.   Avoid close contact with people who are sick.  Avoid places or events with large numbers of people in one location, like concerts or sporting events.  Carefully consider travel plans you have or are making.  If you are planning any travel outside or inside the [place], visit the [place] webpage for the latest health notices.  If you have some symptoms but not all symptoms, continue to monitor at home and seek medical attention if your symptoms worsen.  If you are having a medical emergency, call 911.   [place] / e-Visit: [email or web address]         [facility]: [facility]: [phone]                   [facility]: [phone]

## 2018-10-08 NOTE — ED Provider Notes (Signed)
[facility] DEPT Provider Note   CSN: [id number] Arrival date & time: 09/18/2018  1634     History   Chief Complaint No chief complaint on file.   HPI Austin Yu is a 73 y.o. male.  Presents the ER with oxygen saturation, shortness of breath.  Over the past couple weeks he has noted steadily increasing shortness of breath.  Worse with exertion, improved with rest, not associated with chest pain.  Also has had decreased energy over the same time period.  No fevers or cough.  Went to his cancer office today and he was noted to be hypoxic on room air, placed on supplemental oxygen and transported to the emergency department for further evaluation.  Patient has history of rectal cancer, lung cancer.  Completed chemotherapy.     HPI  Past Medical History:  Diagnosis Date  . Abnormal chest sounds   . BPH (benign prostatic hypertrophy)   . Cancer [facility])    rectum cancer; dx 01/21/2018 by Dr. Loletha Carrow by colonoscopy  . Complication of anesthesia    ISSUE W/ EXTUBATION 05-18-2015 IN [facility] -- REFER TO ANESTHESIA NOTE''S;    . DVT (deep venous thrombosis) (HCC)    right jugular vein. now on eliquis   . Dyspnea    ongoing d/t hx of pulmonary fibrosis   . Elevated PSA   . GERD (gastroesophageal reflux disease)   . Hematuria    ONGOING   . History of airway aspiration   . History of kidney stones   . Hyperlipidemia   . Hyperplasia of prostate without lower urinary tract symptoms (LUTS)   . Left ureteral stone   . Lung nodule   . Nephrolithiasis   . Postinflammatory pulmonary fibrosis (HCC)    PULMOLOGIST-  DR UYQI-  IDIOPATHIC   . Pulmonary fibrosis ([place])   . Rectal cancer ([name])   . Sinus tachycardia by electrocardiogram    states he was told this was caused by chemo drugs; now compelted chemo, reports no acute cardiac symtoms,   . Strawberry hemangioma of skin   . Upper airway cough syndrome    PULMOLOGIST-  DR HKVQ    Patient Active Problem List    Diagnosis Date Noted  . HCAP (healthcare-associated pneumonia) 09/21/2018  . Primary malignant neoplasm of bronchus of left lower lobe ([place]) 07/17/2018  . Nephrolithiasis 06/10/2018  . Port-A-Cath in place 04/18/2018  . Tachycardia 04/16/2018  . Rectal cancer ([name]) 02/12/2018  . Goals of care, counseling/discussion 02/12/2018  . Abnormal findings on diagnostic imaging of lung 02/04/2018  . IPF (idiopathic pulmonary fibrosis) ([name]) 02/04/2018  . Positive colorectal cancer screening using Cologuard test   . Upper airway cough syndrome 02/02/2015  . Postinflammatory pulmonary fibrosis ([name]) 02/01/2015    Past Surgical History:  Procedure Laterality Date  . BIOPSY  01/21/2018   Procedure: BIOPSY;  Surgeon: Doran Stabler, MD;  Location: [facility];  Service: Gastroenterology;;  . COLONOSCOPY WITH PROPOFOL N/A 01/21/2018   Procedure: COLONOSCOPY WITH PROPOFOL;  Surgeon: Doran Stabler, MD;  Location: [facility];  Service: Gastroenterology;  Laterality: N/A;  . CYSTOSCOPY WITH HOLMIUM LASER LITHOTRIPSY Left 06/01/2015   Procedure: CYSTOSCOPY WITH HOLMIUM LASER LITHOTRIPSY;  Surgeon: Irine Seal, MD;  Location: [facility];  Service: Urology;  Laterality: Left;  . CYSTOSCOPY WITH RETROGRADE PYELOGRAM, URETEROSCOPY AND STENT PLACEMENT Right 06/10/2018   Procedure: CYSTOSCOPY WITH RIGHT RETROGRADE PYELOGRAM, URETEROSCOPY HOLMIUM LASER AND STENT PLACEMENT basket extraction stone;  Surgeon: Jeffie Pollock,  Jenny Reichmann, MD;  Location: [facility];  Service: Urology;  Laterality: Right;  . CYSTOSCOPY WITH URETEROSCOPY, STONE BASKETRY AND STENT PLACEMENT Left 05/18/2015   Procedure: CYSTOSCOPY WITH STENT PLACEMENT AND RETROGRADE PYELOGRAM;  Surgeon: Irine Seal, MD;  Location: [facility];  Service: Urology;  Laterality: Left;  . CYSTOSCOPY WITH URETEROSCOPY, STONE BASKETRY AND STENT PLACEMENT Left 06/01/2015   Procedure: CYSTOSCOPY WITH URETEROSCOPY, STONE BASKETRY AND STENT PLACEMENT;  Surgeon: Irine Seal,  MD;  Location: [facility];  Service: Urology;  Laterality: Left;  . CYSTOSCOPY/URETEROSCOPY/HOLMIUM LASER/STENT PLACEMENT Right 08/01/2018   Procedure: CYSTOSCOPY/URETEROSCOPY/HOLMIUM LASER/STENT EXCHANGE;  Surgeon: Irine Seal, MD;  Location: [facility];  Service: Urology;  Laterality: Right;  . EXTRACORPOREAL SHOCK WAVE LITHOTRIPSY  2007  . IR IMAGING GUIDED PORT INSERTION  02/14/2018  . TONSILLECTOMY  age [age]  . VIDEO BRONCHOSCOPY WITH ENDOBRONCHIAL NAVIGATION Left 02/07/2018   Procedure: VIDEO BRONCHOSCOPY WITH ENDOBRONCHIAL NAVIGATION;  Surgeon: Collene Gobble, MD;  Location: [facility];  Service: Thoracic;  Laterality: Left;  Marland Kitchen VIDEO BRONCHOSCOPY WITH ENDOBRONCHIAL ULTRASOUND Left 02/07/2018   Procedure: VIDEO BRONCHOSCOPY WITH ENDOBRONCHIAL ULTRASOUND;  Surgeon: Collene Gobble, MD;  Location: [facility];  Service: Thoracic;  Laterality: Left;        Home Medications    Prior to Admission medications   Medication Sig Start Date End Date Taking? Authorizing Provider  apixaban (ELIQUIS) 2.5 MG TABS tablet Take 2.5 mg by mouth daily.    Yes [provider]  famotidine (PEPCID) 20 MG tablet Take 1 tablet (20 mg total) by mouth at bedtime. 08/01/17  Yes Tanda Rockers, MD  lidocaine-prilocaine (EMLA) cream Apply 1 application topically as needed (port access).   Yes [provider]  pantoprazole (PROTONIX) 40 MG tablet Take 1 tablet (40 mg total) by mouth daily. 04/22/18  Yes Tanda Rockers, MD  tamsulosin (FLOMAX) 0.4 MG CAPS capsule Take 0.4 mg by mouth at bedtime.  05/07/15  Yes [provider]    Family History Family History  Problem Relation Age of Onset  . Heart failure Father        CHF  . Healthy Brother   . Healthy Daughter   . Healthy Daughter     Social History Social History   Tobacco Use  . Smoking status: Never Smoker  . Smokeless tobacco: Never Used  Substance Use Topics  . Alcohol use: Not Currently    Alcohol/week: 0.0 standard  drinks  . Drug use: No     Allergies   Patient has no known allergies.   Review of Systems Review of Systems  Constitutional: Positive for fatigue. Negative for chills and fever.  HENT: Negative for ear pain and sore throat.   Eyes: Negative for pain and visual disturbance.  Respiratory: Positive for shortness of breath. Negative for cough.   Cardiovascular: Negative for chest pain and palpitations.  Gastrointestinal: Negative for abdominal pain and vomiting.  Genitourinary: Negative for dysuria and hematuria.  Musculoskeletal: Negative for arthralgias and back pain.  Skin: Negative for color change and rash.  Neurological: Negative for seizures and syncope.  All other systems reviewed and are negative.    Physical Exam Updated Vital Signs BP 128/79 (BP Location: Right Arm)   Pulse (!) 113   Temp 98 F (36.7 C)   Resp 17   Ht 5\' 7"  (1.702 m)   Wt 71.7 kg   SpO2 94%   BMI 24.75 kg/m   Physical Exam Vitals signs and nursing note reviewed.  Constitutional:      Appearance: He is well-developed.  HENT:     Head: Normocephalic and atraumatic.  Eyes:     Conjunctiva/sclera: Conjunctivae normal.  Neck:     Musculoskeletal: Neck supple.  Cardiovascular:     Rate and Rhythm: Regular rhythm. Tachycardia present.     Heart sounds: No murmur.  Pulmonary:     Effort: Pulmonary effort is normal. No respiratory distress.     Breath sounds: Normal breath sounds.     Comments: Mild tachypnea but no respiratory distress Abdominal:     Palpations: Abdomen is soft.     Tenderness: There is no abdominal tenderness.  Skin:    General: Skin is warm and dry.  Neurological:     Mental Status: He is alert.      ED Treatments / Results  Labs (all labs ordered are listed, but only abnormal results are displayed) Labs Reviewed  CBC WITH DIFFERENTIAL/PLATELET - Abnormal; Notable for the following components:      Result Value   RBC 4.08 (*)    MCV 101.2 (*)    RDW 16.8 (*)     Platelets 137 (*)    All other components within normal limits  BASIC METABOLIC PANEL - Abnormal; Notable for the following components:   Glucose, Bld 120 (*)    GFR calc non Af Amer 60 (*)    All other components within normal limits  SARS CORONAVIRUS 2 (HOSPITAL ORDER, [place] [facility])  BRAIN NATRIURETIC PEPTIDE  HIV ANTIBODY (ROUTINE TESTING W REFLEX)  HIV4GL SAVE TUBE  LEGIONELLA PNEUMOPHILA SEROGP 1 UR AG  STREP PNEUMONIAE URINARY ANTIGEN  CBC  COMPREHENSIVE METABOLIC PANEL  TYPE AND SCREEN  ABO/RH  TROPONIN I (HIGH SENSITIVITY)  TROPONIN I (HIGH SENSITIVITY)    EKG EKG Interpretation  Date/Time:  Tuesday October 08 2018 16:45:10 EDT Ventricular Rate:  123 PR Interval:    QRS Duration: 97 QT Interval:  335 QTC Calculation: 480 R Axis:   -164 Text Interpretation:  Sinus tachycardia Right axis deviation Consider anterior infarct Borderline T abnormalities, inferior leads No significant change since last tracing No STEMI Confirmed by Madalyn Rob [phone]) on 09/17/2018 4:47:03 PM   Radiology Dg Chest 2 View  Result Date: 09/22/2018 CLINICAL DATA:  History of rectal and lung carcinoma with cough and dyspnea EXAM: CHEST - 2 VIEW COMPARISON:  07/23/2018 FINDINGS: Right chest wall port is again seen. Cardiac shadow is stable. The overall inspiratory effort is poor. Chronic fibrotic changes are noted within the lungs bilaterally. These are worse in the left lower lobe than on the right. Mild atelectasis is noted in the right lung base new from the prior exam. No sizable effusion is seen. No acute bony abnormality is noted. IMPRESSION: Right basilar atelectasis superimposed over chronic fibrotic and post radiation changes. Electronically Signed   By: Inez Catalina M.D.   On: 10/09/2018 16:02   Ct Angio Chest Pe W And/or Wo Contrast  Result Date: 10/07/2018 CLINICAL DATA:  Dyspnea.  History of lung cancer. EXAM: CT ANGIOGRAPHY CHEST WITH CONTRAST  TECHNIQUE: Multidetector CT imaging of the chest was performed using the standard protocol during bolus administration of intravenous contrast. Multiplanar CT image reconstructions and MIPs were obtained to evaluate the vascular anatomy. CONTRAST:  156mL OMNIPAQUE IOHEXOL 350 MG/ML SOLN COMPARISON:  CT scan of July 23, 2018.  CT scan of April 19, 2018. FINDINGS: Cardiovascular: Satisfactory opacification of the pulmonary arteries to the segmental level. No evidence of  pulmonary embolism. Normal heart size. No pericardial effusion. Mediastinum/Nodes: Moderate size sliding-type hiatal hernia is noted. Thyroid gland is unremarkable. 1.7 cm left hilar lymph node is noted which is slightly smaller compared to prior exam. Lungs/Pleura: No pneumothorax or pleural effusion is noted. Significantly increased lung opacities are noted concerning for worsening edema or possibly inflammation. Upper Abdomen: Cholelithiasis is noted. Musculoskeletal: No chest wall abnormality. No acute or significant osseous findings. Review of the MIP images confirms the above findings. IMPRESSION: No definite evidence of pulmonary embolus. Significantly increased lung opacities are noted bilaterally concerning for worsening inflammation or edema. Moderate size sliding-type hiatal hernia. 1.7 cm left hilar lymph node is noted which is slightly decreased compared to prior exam. Cholelithiasis. Electronically Signed   By: Marijo Conception M.D.   On: 10/06/2018 20:48    Procedures .Critical Care Performed by: Lucrezia Starch, MD Authorized by: Lucrezia Starch, MD   Critical care provider statement:    Critical care time (minutes):  34   Critical care was necessary to treat or prevent imminent or life-threatening deterioration of the following conditions:  Respiratory failure   Critical care was time spent personally by me on the following activities:  Discussions with consultants, evaluation of patient's response to treatment,  examination of patient, ordering and performing treatments and interventions, ordering and review of laboratory studies, ordering and review of radiographic studies, pulse oximetry, re-evaluation of patient's condition, obtaining history from patient or surrogate and review of old charts   (including critical care time)  Medications Ordered in ED Medications  0.9 %  sodium chloride infusion ( Intravenous New Bag/Given 09/23/2018 2328)  vancomycin (VANCOCIN) 1,750 mg in sodium chloride 0.9 % 500 mL IVPB (has no administration in time range)  ceFEPIme (MAXIPIME) 2 g in sodium chloride 0.9 % 100 mL IVPB (has no administration in time range)  apixaban (ELIQUIS) tablet 2.5 mg (has no administration in time range)  iohexol (OMNIPAQUE) 350 MG/ML injection 100 mL (100 mLs Intravenous Contrast Given 09/27/2018 2019)  sodium chloride (PF) 0.9 % injection (10 mLs  Given 09/30/2018 1919)  cefTRIAXone (ROCEPHIN) 1 g in sodium chloride 0.9 % 100 mL IVPB (1 g Intravenous New Bag/Given 09/12/2018 2259)  azithromycin (ZITHROMAX) 500 mg in sodium chloride 0.9 % 250 mL IVPB (500 mg Intravenous New Bag/Given 09/19/2018 2327)     Initial Impression / Assessment and Plan / ED Course  I have reviewed the triage vital signs and the nursing notes.  Pertinent labs & imaging results that were available during my care of the patient were reviewed by me and considered in my medical decision making (see chart for details).  Clinical Course as of Oct 09 43  Tue Oct 08, 2018  1700 Called to bedside due to low oxygen saturation, placed on supplemental oxygen with significant improvement in pulse oximetry, patient in no respiratory distress except noted mild tachypnea, will check labs EKG and reassess   [RD]    Clinical Course User Index [RD] Lucrezia Starch, MD       73 year old male who presents to ER with couple weeks of shortness of breath, low oxygen.  Patient is history of lung cancer, rectal cancer.  Here noted to be  hypoxic on room air but had significant improvement with couple liters nasal cannula.  No respiratory distress.  Also noted to have some tachycardia.  EKG without ischemic changes.  No anemia.  CTA chest negative for pulmonary embolism but did demonstrate some increased lung opacification concerning  for inflammation or edema.  No elevation in BNP, doubt fluid overload.  Will treat for possible community-acquired pneumonia.  Consulted hospitalist for admission.  Final Clinical Impressions(s) / ED Diagnoses   Final diagnoses:  Acute respiratory failure with hypoxia ([place])  Community acquired pneumonia, unspecified laterality    ED Discharge Orders    None       Lucrezia Starch, MD 10/09/18 [id number]

## 2018-10-08 NOTE — Progress Notes (Signed)
Pt. arrived to treatment area. Alert and oriented x 4.  HR = 124. Pt. denies complaints of chest pain and dizziness. Pt. respirations 28-30, O2 sat 74% on room air. Oxygen 2 liters per nasal canula applied and O2 sat 94%. NP notifiied and in for observation. Pt. transferred to the Emergency Department.

## 2018-10-08 NOTE — ED Notes (Signed)
Pt transported to CT ?

## 2018-10-08 NOTE — Progress Notes (Signed)
Pharmacy Antibiotic and Anticoagulation note  Austin Yu is a 73 y.o. male admitted on 09/12/2018 with pneumonia.  Pharmacy has been consulted for cefepime and vancomycin dosing.  Plan: Cefepime 2 Gm IV q12h Vancomycin 1750 mg x1 then 1250 mg IV q24h for est AUC = 506 Goal AUC = 400-550 F/u scr/cultures/levels Will resumed apixaban 2.5 mg daily as on PTA per oncology note on 08/30/18 If thrombocytopenia resolves would increase to 5 mg bid for a-fib  Height: 5\' 7"  (170.2 cm) Weight: 158 lb (71.7 kg) IBW/kg (Calculated) : 66.1  Temp (24hrs), Avg:98.2 F (36.8 C), Min:98 F (36.7 C), Max:98.3 F (36.8 C)  Recent Labs  Lab 09/20/2018 1420 10/05/2018 1646  WBC 6.7 7.3  CREATININE 1.15 1.21    Estimated Creatinine Clearance: 52.4 mL/min (by C-G formula based on SCr of 1.21 mg/dL).    No Known Allergies  Antimicrobials this admission: 9/29 rocephin >> x1 ED  9/29 zmax >>  9/29 vancomycin >> 9/30 cefepime >>  Dose adjustments this admission:   Microbiology results:  BCx:   UCx:    Sputum:    MRSA PCR:   Thank you for allowing pharmacy to be a part of this patient's care.  Dorrene German 09/14/2018 10:44 PM

## 2018-10-08 NOTE — H&P (Addendum)
TRH H&P    Patient Demographics:    Austin Yu, is a 73 y.o. male  MRN: [id number]  DOB - August 11, 1945  Admit Date - 09/11/2018  Referring MD/NP/PA:  Madalyn Rob  Outpatient Primary MD for the patient is Deland Pretty, MD  Patient coming from:  home  Chief complaint-  hypoxia   HPI:    Austin Yu  is a 73 y.o. male, w hyperlipidemia, Bph, Psa Elevation, Gerd, Rectal cancer dx 01/21/2018, R jugular vein DVT, Pulmonary fibrosis (not on home o2), apparently presents with nonproductive cough and was found to have low pox by oncology clinic and sent to ED for evaluation.    In ED,  T 98 P 125, R 30, Bp 113/94  Pox 99% on o2 [place] Wt 71.7, kg  CTA chest IMPRESSION: No definite evidence of pulmonary embolus.  Significantly increased lung opacities are noted bilaterally concerning for worsening inflammation or edema.  Moderate size sliding-type hiatal hernia.  1.7 cm left hilar lymph node is noted which is slightly decreased compared to prior exam.  Cholelithiasis.  Wbc 7.3, Hgb 13.1, Plt 137 Na 135, K 5.1, Bun 17, Creatinine 1.21 Trop 9 BNP 41.2  Ekg sinus tach at 125, RAD, nl int, no st-t changes c/w ischemia  Pt is given rocephin iv, and zithromax iv in ED.   Pt was admitted 08/01/2018 for urologic procedure.  Pt will be admitted for sepsis (tachycardia, rr=30, hypoxia,  secondary to Hcap       Review of systems:    In addition to the HPI above,  No Fever-chills, No Headache, No changes with Vision or hearing, No problems swallowing food or Liquids, No Chest pain, , No Abdominal pain, No Nausea or Vomiting, bowel movements are regular, No Blood in stool or Urine, No dysuria, No new skin rashes or bruises, No new joints pains-aches,  No new weakness, tingling, numbness in any extremity, No recent weight gain or loss, No polyuria, polydypsia or polyphagia, No significant  Mental Stressors.  All other systems reviewed and are negative.    Past History of the following :    Past Medical History:  Diagnosis Date   Abnormal chest sounds    BPH (benign prostatic hypertrophy)    Cancer (HCC)    rectum cancer; dx 01/21/2018 by Dr. Loletha Carrow by colonoscopy   Complication of anesthesia    ISSUE W/ EXTUBATION 05-18-2015 IN [facility] -- REFER TO ANESTHESIA NOTE''S;     DVT (deep venous thrombosis) ([place])    right jugular vein. now on eliquis    Dyspnea    ongoing d/t hx of pulmonary fibrosis    Elevated PSA    GERD (gastroesophageal reflux disease)    Hematuria    ONGOING    History of airway aspiration    History of kidney stones    Hyperlipidemia    Hyperplasia of prostate without lower urinary tract symptoms (LUTS)    Left ureteral stone    Lung nodule    Nephrolithiasis    Postinflammatory pulmonary fibrosis ([place])  PULMOLOGIST-  DR Melvyn Novas-  IDIOPATHIC    Pulmonary fibrosis ([place])    Rectal cancer (HCC)    Sinus tachycardia by electrocardiogram    states he was told this was caused by chemo drugs; now compelted chemo, reports no acute cardiac symtoms,    [place] hemangioma of skin    Upper airway cough syndrome    PULMOLOGIST-  DR EQAS      Past Surgical History:  Procedure Laterality Date   BIOPSY  01/21/2018   Procedure: BIOPSY;  Surgeon: Doran Stabler, MD;  Location: [facility];  Service: Gastroenterology;;   COLONOSCOPY WITH PROPOFOL N/A 01/21/2018   Procedure: COLONOSCOPY WITH PROPOFOL;  Surgeon: Doran Stabler, MD;  Location: [facility];  Service: Gastroenterology;  Laterality: N/A;   CYSTOSCOPY WITH HOLMIUM LASER LITHOTRIPSY Left 06/01/2015   Procedure: CYSTOSCOPY WITH HOLMIUM LASER LITHOTRIPSY;  Surgeon: Irine Seal, MD;  Location: [facility];  Service: Urology;  Laterality: Left;   CYSTOSCOPY WITH RETROGRADE PYELOGRAM, URETEROSCOPY AND STENT PLACEMENT Right 06/10/2018   Procedure: CYSTOSCOPY  WITH RIGHT RETROGRADE PYELOGRAM, URETEROSCOPY HOLMIUM LASER AND STENT PLACEMENT basket extraction stone;  Surgeon: Irine Seal, MD;  Location: [facility];  Service: Urology;  Laterality: Right;   CYSTOSCOPY WITH URETEROSCOPY, STONE BASKETRY AND STENT PLACEMENT Left 05/18/2015   Procedure: CYSTOSCOPY WITH STENT PLACEMENT AND RETROGRADE PYELOGRAM;  Surgeon: Irine Seal, MD;  Location: [facility];  Service: Urology;  Laterality: Left;   CYSTOSCOPY WITH URETEROSCOPY, STONE BASKETRY AND STENT PLACEMENT Left 06/01/2015   Procedure: CYSTOSCOPY WITH URETEROSCOPY, STONE BASKETRY AND STENT PLACEMENT;  Surgeon: Irine Seal, MD;  Location[facility];  Service: Urology;  Laterality: Left;   CYSTOSCOPY/URETEROSCOPY/HOLMIUM LASER/STENT PLACEMENT Right 08/01/2018   Procedure: CYSTOSCOPY/URETEROSCOPY/HOLMIUM LASER/STENT EXCHANGE;  Surgeon: Irine Seal, MD;  Location: [facility];  Service: Urology;  Laterality: Right;   EXTRACORPOREAL SHOCK WAVE LITHOTRIPSY  2007   IR IMAGING GUIDED PORT INSERTION  02/14/2018   TONSILLECTOMY  age 21-4   VIDEO BRONCHOSCOPY WITH ENDOBRONCHIAL NAVIGATION Left 02/07/2018   Procedure: VIDEO BRONCHOSCOPY WITH ENDOBRONCHIAL NAVIGATION;  Surgeon: Collene Gobble, MD;  Location: [facility];  Service: Thoracic;  Laterality: Left;   VIDEO BRONCHOSCOPY WITH ENDOBRONCHIAL ULTRASOUND Left 02/07/2018   Procedure: VIDEO BRONCHOSCOPY WITH ENDOBRONCHIAL ULTRASOUND;  Surgeon: Collene Gobble, MD;  Location: [facility];  Service: Thoracic;  Laterality: Left;      Social History:      Social History   Tobacco Use   Smoking status: Never Smoker   Smokeless tobacco: Never Used  Substance Use Topics   Alcohol use: Not Currently    Alcohol/week: 0.0 standard drinks       Family History :     Family History  Problem Relation Age of Onset   Heart failure Father        CHF   Healthy Brother    Healthy Daughter    Healthy Daughter        Home Medications:   Prior to Admission  medications   Medication Sig Start Date End Date Taking? Authorizing Provider  apixaban (ELIQUIS) 2.5 MG TABS tablet Take 2.5 mg by mouth 2 (two) times daily.    [provider]  famotidine (PEPCID) 20 MG tablet Take 1 tablet (20 mg total) by mouth at bedtime. 08/01/17   Tanda Rockers, MD  HYDROcodone-acetaminophen (NORCO) 5-325 MG tablet Take 1 tablet by mouth every 6 (six) hours as needed for moderate pain. Patient not taking: Reported on 08/12/2018 08/01/18  Irine Seal, MD  lidocaine-prilocaine (EMLA) cream Apply 1 application topically as needed (port access).    [provider]  pantoprazole (PROTONIX) 40 MG tablet Take 1 tablet (40 mg total) by mouth daily. 04/22/18   Tanda Rockers, MD  prochlorperazine (COMPAZINE) 10 MG tablet Take 1 tablet (10 mg total) by mouth every 6 (six) hours as needed for nausea or vomiting. Patient not taking: Reported on 06/24/2018 02/12/18   Owens Shark, NP  sucralfate (CARAFATE) 1 g tablet Take 1 tablet (1 g total) by mouth 4 (four) times daily. 09/13/18   Kyung Rudd, MD  tamsulosin (FLOMAX) 0.4 MG CAPS capsule Take 0.4 mg by mouth at bedtime.  05/07/15   [provider]     Allergies:    No Known Allergies   Physical Exam:   Vitals  Blood pressure (!) 93/55, pulse 93, temperature 98 F (36.7 C), resp. rate (!) 28, height 5\' 7"  (1.702 m), weight 71.7 kg, SpO2 100 %.  1.  General: Alert and oriented x3  2. Psychiatric: Euthymic  3. Neurologic: Nonfocal  4. HEENMT:  Anicteric, pupils 1.5 mm, symmetric, direct, consensual, near reflexes intact  5. Respiratory : Crackles at the left greater than right lung base  6. Cardiovascular : Tachycardic S1-S2 no murmurs gallops or rubs  7. Gastrointestinal:  Abdomen: Soft nontender nondistended positive bowel sounds  8. Skin:  Extremities no cyanosis clubbing or edema  9.Musculoskeletal:  Good range of motion    Data Review:    CBC Recent Labs  Lab 09/30/2018 1420  09/26/2018 1646  WBC 6.7 7.3  HGB 12.2* 13.1  HCT 37.3* 41.3  PLT 153 137*  MCV 98.7 101.2*  MCH 32.3 32.1  MCHC 32.7 31.7  RDW 16.5* 16.8*  LYMPHSABS 0.8 0.8  MONOABS 0.7 0.7  EOSABS 0.2 0.1  BASOSABS 0.1 0.1   ------------------------------------------------------------------------------------------------------------------  Results for orders placed or performed during the hospital encounter of 09/13/2018 (from the past 48 hour(s))  Brain natriuretic peptide     Status: None   Collection Time: 09/30/2018  4:45 PM  Result Value Ref Range   B Natriuretic Peptide 41.2 0.0 - 100.0 pg/mL    Comment: Performed at [facility], [place] [address] [place], [place] [address]  CBC with Differential     Status: Abnormal   Collection Time: 10/07/2018  4:46 PM  Result Value Ref Range   WBC 7.3 4.0 - 10.5 K/uL   RBC 4.08 (L) 4.22 - 5.81 MIL/uL   Hemoglobin 13.1 13.0 - 17.0 g/dL   HCT 41.3 39.0 - 52.0 %   MCV 101.2 (H) 80.0 - 100.0 fL   MCH 32.1 26.0 - 34.0 pg   MCHC 31.7 30.0 - 36.0 g/dL   RDW 16.8 (H) 11.5 - 15.5 %   Platelets 137 (L) 150 - 400 K/uL   nRBC 0.0 0.0 - 0.2 %   Neutrophils Relative % 76 %   Neutro Abs 5.5 1.7 - 7.7 K/uL   Lymphocytes Relative 11 %   Lymphs Abs 0.8 0.7 - 4.0 K/uL   Monocytes Relative 10 %   Monocytes Absolute 0.7 0.1 - 1.0 K/uL   Eosinophils Relative 2 %   Eosinophils Absolute 0.1 0.0 - 0.5 K/uL   Basophils Relative 1 %   Basophils Absolute 0.1 0.0 - 0.1 K/uL   Immature Granulocytes 0 %   Abs Immature Granulocytes 0.02 0.00 - 0.07 K/uL    Comment: Performed at [facility], [place]  [address], [place], [place] [address]  Basic metabolic panel     Status: Abnormal   Collection Time: 09/20/2018  4:46 PM  Result Value Ref Range   Sodium 135 135 - 145 mmol/L   Potassium 5.1 3.5 - 5.1 mmol/L   Chloride 101 98 - 111 mmol/L   CO2 23 22 - 32 mmol/L   Glucose, Bld 120 (H) 70 - 99 mg/dL   BUN 17 8 - 23 mg/dL   Creatinine, Ser 1.21  0.61 - 1.24 mg/dL   Calcium 9.1 8.9 - 10.3 mg/dL   GFR calc non Af Amer 60 (L) >60 mL/min   GFR calc Af Amer >60 >60 mL/min   Anion gap 11 5 - 15    Comment: Performed at [facility], [place] [address], [place], [place] [address]  Troponin I (High Sensitivity)     Status: None   Collection Time: 09/25/2018  4:46 PM  Result Value Ref Range   Troponin I (High Sensitivity) 9 <18 ng/L    Comment: (NOTE) Elevated high sensitivity troponin I (hsTnI) values and significant  changes across serial measurements may suggest ACS but many other  chronic and acute conditions are known to elevate hsTnI results.  Refer to the "Links" section for chest pain algorithms and additional  guidance. Performed at [facility], [place] [address] [place],  [address]   SARS Coronavirus 2 [facility] order, Performed in [facility] hospital lab) Nasopharyngeal Nasopharyngeal Swab     Status: None   Collection Time: 09/13/2018  4:49 PM   Specimen: Nasopharyngeal Swab  Result Value Ref Range   SARS Coronavirus 2 NEGATIVE NEGATIVE    Comment: (NOTE) If result is NEGATIVE SARS-CoV-2 target nucleic acids are NOT DETECTED. The SARS-CoV-2 RNA is generally detectable in upper and lower  respiratory specimens during the acute phase of infection. The lowest  concentration of SARS-CoV-2 viral copies this assay can detect is 250  copies / mL. A negative result does not preclude SARS-CoV-2 infection  and should not be used as the sole basis for treatment or other  patient management decisions.  A negative result may occur with  improper specimen collection / handling, submission of specimen other  than nasopharyngeal swab, presence of viral mutation(s) within the  areas targeted by this assay, and inadequate number of viral copies  (<250 copies / mL). A negative result must be combined with clinical  observations, patient history, and epidemiological information. If result is  POSITIVE SARS-CoV-2 target nucleic acids are DETECTED. The SARS-CoV-2 RNA is generally detectable in upper and lower  respiratory specimens dur ing the acute phase of infection.  Positive  results are indicative of active infection with SARS-CoV-2.  Clinical  correlation with patient history and other diagnostic information is  necessary to determine patient infection status.  Positive results do  not rule out bacterial infection or co-infection with other viruses. If result is PRESUMPTIVE POSTIVE SARS-CoV-2 nucleic acids MAY BE PRESENT.   A presumptive positive result was obtained on the submitted specimen  and confirmed on repeat testing.  While 2019 novel coronavirus  (SARS-CoV-2) nucleic acids may be present in the submitted sample  additional confirmatory testing may be necessary for epidemiological  and / or clinical management purposes  to differentiate between  SARS-CoV-2 and other Sarbecovirus currently known to infect humans.  If clinically indicated additional testing with an alternate test  methodology [phone]) is advised. The SARS-CoV-2 RNA is generally  detectable in upper and lower respiratory  sp ecimens during the acute  phase of infection. The expected result is Negative. Fact Sheet for Patients:  StrictlyIdeas.no Fact Sheet for Healthcare Providers: BankingDealers.co.za This test is not yet approved or cleared by the [place] FDA and has been authorized for detection and/or diagnosis of SARS-CoV-2 by FDA under an Emergency Use Authorization (EUA).  This EUA will remain in effect (meaning this test can be used) for the duration of the COVID-19 declaration under Section 564(b)(1) of the Act, 21 U.S.C. section 360bbb-3(b)(1), unless the authorization is terminated or revoked sooner. Performed at [facility], [place] [address] [place], [place] [address]   Type and screen [place]     Status: None   Collection Time: 10/07/2018  5:05 PM  Result Value Ref Range   ABO/RH(D) O POS    Antibody Screen NEG    Sample Expiration      10/11/2018,2359 Performed at [facility], [place] [address] [place], [place] [address]   ABO/Rh     Status: None (Preliminary result)   Collection Time: 09/20/2018  5:05 PM  Result Value Ref Range   ABO/RH(D)      O POS Performed at [facility], [place] [address], [place], [place] [address]   Troponin I (High Sensitivity)     Status: None   Collection Time: 09/27/2018  9:00 PM  Result Value Ref Range   Troponin I (High Sensitivity) 9 <18 ng/L    Comment: (NOTE) Elevated high sensitivity troponin I (hsTnI) values and significant  changes across serial measurements may suggest ACS but many other  chronic and acute conditions are known to elevate hsTnI results.  Refer to the "Links" section for chest pain algorithms and additional  guidance. Performed at [facility], [place] [name] [place], [place] [address]     Chemistries  Recent Labs  Lab 10/03/2018 1420 09/22/2018 1646  NA 138 135  K 4.0 5.1  CL 103 101  CO2 24 23  GLUCOSE 153* 120*  BUN 16 17  CREATININE 1.15 1.21  CALCIUM 9.3 9.1  AST 31  --   ALT 15  --   ALKPHOS 65  --   BILITOT 0.6  --    ------------------------------------------------------------------------------------------------------------------  ------------------------------------------------------------------------------------------------------------------ GFR: Estimated Creatinine Clearance: 52.4 mL/min (by C-G formula based on SCr of 1.21 mg/dL). Liver Function Tests: Recent Labs  Lab 09/13/2018 1420  AST 31  ALT 15  ALKPHOS 65  BILITOT 0.6  PROT 7.2  ALBUMIN 3.0*   No results for input(s): LIPASE, AMYLASE in the last 168 hours. No results for input(s): AMMONIA in the last 168 hours. Coagulation Profile: No results for input(s): INR,  PROTIME in the last 168 hours. Cardiac Enzymes: No results for input(s): CKTOTAL, CKMB, CKMBINDEX, TROPONINI in the last 168 hours. BNP (last 3 results) No results for input(s): PROBNP in the last 8760 hours. HbA1C: No results for input(s): HGBA1C in the last 72 hours. CBG: No results for input(s): GLUCAP in the last 168 hours. Lipid Profile: No results for input(s): CHOL, HDL, LDLCALC, TRIG, CHOLHDL, LDLDIRECT in the last 72 hours. Thyroid Function Tests: No results for input(s): TSH, T4TOTAL, FREET4, T3FREE, THYROIDAB in the last 72 hours. Anemia Panel: No results for input(s): VITAMINB12, FOLATE, FERRITIN, TIBC, IRON, RETICCTPCT in the last 72 hours.  --------------------------------------------------------------------------------------------------------------- Urine analysis:    Component Value Date/Time   COLORURINE YELLOW 04/01/2018 2249   APPEARANCEUR HAZY (A) 04/01/2018 2249   LABSPEC 1.019 04/01/2018  2249   PHURINE 6.0 04/01/2018 2249   GLUCOSEU NEGATIVE 04/01/2018 2249   HGBUR LARGE (A) 04/01/2018 2249   BILIRUBINUR NEGATIVE 04/01/2018 2249   [place] 04/01/2018 2249   PROTEINUR 30 (A) 04/01/2018 2249   NITRITE POSITIVE (A) 04/01/2018 2249   LEUKOCYTESUR MODERATE (A) 04/01/2018 2249      Imaging Results:    Dg Chest 2 View  Result Date: 09/30/2018 CLINICAL DATA:  History of rectal and lung carcinoma with cough and dyspnea EXAM: CHEST - 2 VIEW COMPARISON:  07/23/2018 FINDINGS: Right chest wall port is again seen. Cardiac shadow is stable. The overall inspiratory effort is poor. Chronic fibrotic changes are noted within the lungs bilaterally. These are worse in the left lower lobe than on the right. Mild atelectasis is noted in the right lung base new from the prior exam. No sizable effusion is seen. No acute bony abnormality is noted. IMPRESSION: Right basilar atelectasis superimposed over chronic fibrotic and post radiation changes. Electronically Signed    By: Inez Catalina M.D.   On: 09/19/2018 16:02   Ct Angio Chest Pe W And/or Wo Contrast  Result Date: 09/16/2018 CLINICAL DATA:  Dyspnea.  History of lung cancer. EXAM: CT ANGIOGRAPHY CHEST WITH CONTRAST TECHNIQUE: Multidetector CT imaging of the chest was performed using the standard protocol during bolus administration of intravenous contrast. Multiplanar CT image reconstructions and MIPs were obtained to evaluate the vascular anatomy. CONTRAST:  155mL OMNIPAQUE IOHEXOL 350 MG/ML SOLN COMPARISON:  CT scan of July 23, 2018.  CT scan of April 19, 2018. FINDINGS: Cardiovascular: Satisfactory opacification of the pulmonary arteries to the segmental level. No evidence of pulmonary embolism. Normal heart size. No pericardial effusion. Mediastinum/Nodes: Moderate size sliding-type hiatal hernia is noted. Thyroid gland is unremarkable. 1.7 cm left hilar lymph node is noted which is slightly smaller compared to prior exam. Lungs/Pleura: No pneumothorax or pleural effusion is noted. Significantly increased lung opacities are noted concerning for worsening edema or possibly inflammation. Upper Abdomen: Cholelithiasis is noted. Musculoskeletal: No chest wall abnormality. No acute or significant osseous findings. Review of the MIP images confirms the above findings. IMPRESSION: No definite evidence of pulmonary embolus. Significantly increased lung opacities are noted bilaterally concerning for worsening inflammation or edema. Moderate size sliding-type hiatal hernia. 1.7 cm left hilar lymph node is noted which is slightly decreased compared to prior exam. Cholelithiasis. Electronically Signed   By: Marijo Conception M.D.   On: 09/25/2018 20:48       Assessment & Plan:    Principal Problem:   HCAP (healthcare-associated pneumonia) Active Problems:   IPF (idiopathic pulmonary fibrosis) (HCC)   Sepsis (HCC)  Sepsis (tachycardia, rr=30, bp 88/72) Blood culture x2 vanco iv, cefepime iv pharmacy to dose  Hcap Blood  culture x2 Urine strep antigen Urine legionella antigen abx as above zithromax 500mg  iv qday  Hypotension Trop I q3hx2 Check cardiac echo  H/o R IJ DVT Cont Eliquis pharmacy to dose  Gerd Cont PPI, Cont pepcid   DVT Prophylaxis-   Eliquis, SCDs   AM Labs Ordered, also please review Full Orders  Family Communication: Admission, patients condition and plan of care including tests being ordered have been discussed with the patient  who indicate understanding and agree with the plan and Code Status.  Code Status:  FULL CODE per patient  Admission status: Inpatient: Based on patients clinical presentation and evaluation of above clinical data, I have made determination that patient meets Inpatient criteria at this time.  Pt has  sepsis, and will require iv abx, for hcap, pt has high risk of clinical deterioration, pt will require > 2 nites stay.   Time spent in minutes : 70 minutes   Jani Gravel M.D on 10/09/2018 at 2:37 AM

## 2018-10-09 ENCOUNTER — Encounter (HOSPITAL_COMMUNITY): Payer: Self-pay

## 2018-10-09 ENCOUNTER — Telehealth: Payer: Self-pay | Admitting: Radiation Oncology

## 2018-10-09 DIAGNOSIS — J189 Pneumonia, unspecified organism: Secondary | ICD-10-CM

## 2018-10-09 DIAGNOSIS — A419 Sepsis, unspecified organism: Secondary | ICD-10-CM | POA: Diagnosis present

## 2018-10-09 LAB — CBC
HCT: 32.1 % — ABNORMAL LOW (ref 39.0–52.0)
Hemoglobin: 10.1 g/dL — ABNORMAL LOW (ref 13.0–17.0)
MCH: 32.2 pg (ref 26.0–34.0)
MCHC: 31.5 g/dL (ref 30.0–36.0)
MCV: 102.2 fL — ABNORMAL HIGH (ref 80.0–100.0)
Platelets: 102 10*3/uL — ABNORMAL LOW (ref 150–400)
RBC: 3.14 MIL/uL — ABNORMAL LOW (ref 4.22–5.81)
RDW: 16.3 % — ABNORMAL HIGH (ref 11.5–15.5)
WBC: 3.5 10*3/uL — ABNORMAL LOW (ref 4.0–10.5)
nRBC: 0 % (ref 0.0–0.2)

## 2018-10-09 LAB — HIV ANTIBODY (ROUTINE TESTING W REFLEX): HIV Screen 4th Generation wRfx: NONREACTIVE

## 2018-10-09 LAB — COMPREHENSIVE METABOLIC PANEL
ALT: 14 U/L (ref 0–44)
AST: 27 U/L (ref 15–41)
Albumin: 2.6 g/dL — ABNORMAL LOW (ref 3.5–5.0)
Alkaline Phosphatase: 46 U/L (ref 38–126)
Anion gap: 5 (ref 5–15)
BUN: 12 mg/dL (ref 8–23)
CO2: 23 mmol/L (ref 22–32)
Calcium: 7.9 mg/dL — ABNORMAL LOW (ref 8.9–10.3)
Chloride: 111 mmol/L (ref 98–111)
Creatinine, Ser: 0.85 mg/dL (ref 0.61–1.24)
GFR calc Af Amer: >60 mL/min (ref >60)
GFR calc non Af Amer: >60 mL/min (ref >60)
Glucose, Bld: 96 mg/dL (ref 70–99)
Potassium: 4 mmol/L (ref 3.5–5.1)
Sodium: 139 mmol/L (ref 135–145)
Total Bilirubin: 0.5 mg/dL (ref 0.3–1.2)
Total Protein: 5.8 g/dL — ABNORMAL LOW (ref 6.5–8.1)

## 2018-10-09 LAB — INFLUENZA PANEL BY PCR (TYPE A & B)
Influenza A By PCR: NEGATIVE
Influenza B By PCR: NEGATIVE

## 2018-10-09 LAB — MRSA PCR SCREENING: MRSA by PCR: NEGATIVE

## 2018-10-09 LAB — STREP PNEUMONIAE URINARY ANTIGEN: Strep Pneumo Urinary Antigen: NEGATIVE

## 2018-10-09 LAB — ABO/RH: ABO/RH(D): O POS

## 2018-10-09 MED ORDER — TAMSULOSIN HCL 0.4 MG PO CAPS
0.4000 mg | ORAL_CAPSULE | Freq: Every day | ORAL | Status: DC
  Administered 2018-10-09 – 2018-10-14 (×6): 0.4 mg via ORAL
  Filled 2018-10-09 (×6): qty 1

## 2018-10-09 MED ORDER — SUCRALFATE 1 G PO TABS
1.0000 g | ORAL_TABLET | Freq: Four times a day (QID) | ORAL | Status: DC
  Administered 2018-10-09 – 2018-10-15 (×14): 1 g via ORAL
  Filled 2018-10-09 (×19): qty 1

## 2018-10-09 MED ORDER — VANCOMYCIN HCL 10 G IV SOLR
1500.0000 mg | INTRAVENOUS | Status: DC
  Administered 2018-10-10: 1500 mg via INTRAVENOUS
  Filled 2018-10-09: qty 1500

## 2018-10-09 MED ORDER — VANCOMYCIN HCL 10 G IV SOLR: 1250.0000 mg | Freq: Every day | INTRAVENOUS | Status: DC

## 2018-10-09 MED ORDER — LIDOCAINE-PRILOCAINE 2.5-2.5 % EX CREA
1.0000 "application " | TOPICAL_CREAM | CUTANEOUS | Status: DC | PRN
  Filled 2018-10-09: qty 5

## 2018-10-09 MED ORDER — PANTOPRAZOLE SODIUM 40 MG PO TBEC
40.0000 mg | DELAYED_RELEASE_TABLET | Freq: Every day | ORAL | Status: DC
  Administered 2018-10-09 – 2018-10-10 (×2): 40 mg via ORAL
  Filled 2018-10-09 (×2): qty 1

## 2018-10-09 MED ORDER — SODIUM CHLORIDE 0.9 % IV SOLN
2.0000 g | Freq: Three times a day (TID) | INTRAVENOUS | Status: DC
  Administered 2018-10-09 – 2018-10-10 (×4): 2 g via INTRAVENOUS
  Filled 2018-10-09 (×6): qty 2

## 2018-10-09 MED ORDER — SODIUM CHLORIDE 0.9 % IV SOLN
INTRAVENOUS | Status: DC
  Administered 2018-10-09 – 2018-10-10 (×2): via INTRAVENOUS

## 2018-10-09 MED ORDER — SODIUM CHLORIDE 0.9 % IV BOLUS
1000.0000 mL | Freq: Once | INTRAVENOUS | Status: AC
  Administered 2018-10-09: 1000 mL via INTRAVENOUS

## 2018-10-09 MED ORDER — FAMOTIDINE 20 MG PO TABS
20.0000 mg | ORAL_TABLET | Freq: Every day | ORAL | Status: DC
  Administered 2018-10-09 – 2018-10-14 (×6): 20 mg via ORAL
  Filled 2018-10-09 (×6): qty 1

## 2018-10-09 MED ORDER — CHLORHEXIDINE GLUCONATE CLOTH 2 % EX PADS
6.0000 | MEDICATED_PAD | Freq: Every day | CUTANEOUS | Status: DC
  Administered 2018-10-09 – 2018-10-17 (×9): 6 via TOPICAL

## 2018-10-09 NOTE — ED Notes (Signed)
Pt given breakfast tray

## 2018-10-09 NOTE — ED Notes (Signed)
Pt given a lunch tray. 

## 2018-10-09 NOTE — Progress Notes (Signed)
PROGRESS NOTE    Austin Yu  VQM:[id number] DOB: 19-Jun-1945 DOA: 09/25/2018 PCP: Deland Pretty, MD    Brief Narrative:  73 year old male with history of hyperlipidemia, BPH, GERD, rectal cancer diagnosed on 01/2018, right jugular vein DVT, pulmonary fibrosis not on home oxygen, lung cancer on chemotherapy and radiation therapy presented to the emergency room from cancer center with nonproductive cough and progressive shortness of breath.  In the emergency room, he was on 3 L oxygen, CTA of the lungs showed bilateral lower lobe opacities concerning for worsening inflammation or edema.  He has significant perihilar lymphadenopathy.   Assessment & Plan:   Principal Problem:   HCAP (healthcare-associated pneumonia) Active Problems:   IPF (idiopathic pulmonary fibrosis) (HCC)   Sepsis ([place])  Bacterial pneumonia, bilateral lower lobe pneumonia in the setting of lung cancer, lymphadenopathy and previous radiation injury with hypoxia: Antibiotics to treat bacterial pneumonia.  He started on vancomycin and cefepime given severity of symptoms and cancer treatments.Chest physiotherapy, incentive spirometry, deep breathing exercises, sputum induction, mucolytic's and bronchodilators.  Blood cultures were not sent from the emergency room.  He already received 2 doses of antibiotics, it would not be useful. Supplemental oxygen to keep saturations more than 90%. Patient may need ambulatory oxygen on discharge.  Idiopathic pulmonary fibrosis: Probably aggravating oxygen requirement due to underlying idiopathic pulmonary fibrosis.  Will evaluate for need for oxygen on discharge.  History of DVT on the right IJ: Continue Eliquis.  GERD: On PPI continue.  Stage III lung cancer: Followed by oncology.  Further treatment plan as per them.  DVT prophylaxis: Eliquis Code Status: Full code Family Communication: None Disposition Plan: Inpatient hospitalization   Consultants:    Oncology  Procedures:   None  Antimicrobials:   Vancomycin cefepime, 09/16/2018   Subjective: Patient seen and examined.  He is a still in the emergency room all night waiting for inpatient bed.  No overnight events, he continues to feel shortness of breath on movement moving around in the bed, feels very weak.  Has dry cough.  No wheezing.  Afebrile overnight.  Currently on 2 L oxygen.  Objective: Vitals:   10/09/18 1045 10/09/18 1100 10/09/18 1200 10/09/18 1230  BP:  (!) 85/63 103/66 93/61  Pulse: 95 92 99 (!) 106  Resp: (!) 28 (!) 31 (!) 41 (!) 41  Temp:      SpO2: 97% 96% (!) 88% 93%  Weight:      Height:        Intake/Output Summary (Last 24 hours) at 10/09/2018 1249 Last data filed at 10/09/2018 0136 Gross per 24 hour  Intake 350 ml  Output --  Net 350 ml   Filed Weights   09/17/2018 1638  Weight: 71.7 kg    Examination:  General exam: Appears anxious, mildly uncomfortable, on 2 L oxygen.  Chronically sick looking.    Respiratory system: Bilateral poor air entry at bases.  Some conducted airway sounds.  No added sounds.  . Cardiovascular system: S1 & S2 heard, RRR. No JVD, murmurs, rubs, gallops or clicks. No pedal edema. Gastrointestinal system: Abdomen is nondistended, soft and nontender. No organomegaly or masses felt. Normal bowel sounds heard. Central nervous system: Alert and oriented. No focal neurological deficits. Extremities: Symmetric 5 x 5 power. Skin: No rashes, lesions or ulcers Psychiatry: Judgement and insight appear normal. Mood & affect anxious.    Data Reviewed: I have personally reviewed following labs and imaging studies  CBC: Recent Labs  Lab 09/20/2018 1420 09/30/2018 1646  10/09/18 0526  WBC 6.7 7.3 3.5*  NEUTROABS 5.0 5.5  --   HGB 12.2* 13.1 10.1*  HCT 37.3* 41.3 32.1*  MCV 98.7 101.2* 102.2*  PLT 153 137* 081*   Basic Metabolic Panel: Recent Labs  Lab 09/15/2018 1420 09/21/2018 1646 10/09/18 0526  NA 138 135 139  K 4.0  5.1 4.0  CL 103 101 111  CO2 24 23 23   GLUCOSE 153* 120* 96  BUN 16 17 12   CREATININE 1.15 1.21 0.85  CALCIUM 9.3 9.1 7.9*   GFR: Estimated Creatinine Clearance: 74.5 mL/min (by C-G formula based on SCr of 0.85 mg/dL). Liver Function Tests: Recent Labs  Lab 10/01/2018 1420 10/09/18 0526  AST 31 27  ALT 15 14  ALKPHOS 65 46  BILITOT 0.6 0.5  PROT 7.2 5.8*  ALBUMIN 3.0* 2.6*   No results for input(s): LIPASE, AMYLASE in the last 168 hours. No results for input(s): AMMONIA in the last 168 hours. Coagulation Profile: No results for input(s): INR, PROTIME in the last 168 hours. Cardiac Enzymes: No results for input(s): CKTOTAL, CKMB, CKMBINDEX, TROPONINI in the last 168 hours. BNP (last 3 results) No results for input(s): PROBNP in the last 8760 hours. HbA1C: No results for input(s): HGBA1C in the last 72 hours. CBG: No results for input(s): GLUCAP in the last 168 hours. Lipid Profile: No results for input(s): CHOL, HDL, LDLCALC, TRIG, CHOLHDL, LDLDIRECT in the last 72 hours. Thyroid Function Tests: No results for input(s): TSH, T4TOTAL, FREET4, T3FREE, THYROIDAB in the last 72 hours. Anemia Panel: No results for input(s): VITAMINB12, FOLATE, FERRITIN, TIBC, IRON, RETICCTPCT in the last 72 hours. Sepsis Labs: No results for input(s): PROCALCITON, LATICACIDVEN in the last 168 hours.  Recent Results (from the past 240 hour(s))  SARS Coronavirus 2 [facility] order, Performed in [facility] hospital lab) Nasopharyngeal Nasopharyngeal Swab     Status: None   Collection Time: 09/25/2018  4:49 PM   Specimen: Nasopharyngeal Swab  Result Value Ref Range Status   SARS Coronavirus 2 NEGATIVE NEGATIVE Final    Comment: (NOTE) If result is NEGATIVE SARS-CoV-2 target nucleic acids are NOT DETECTED. The SARS-CoV-2 RNA is generally detectable in upper and lower  respiratory specimens during the acute phase of infection. The lowest  concentration of SARS-CoV-2 viral copies this assay can  detect is 250  copies / mL. A negative result does not preclude SARS-CoV-2 infection  and should not be used as the sole basis for treatment or other  patient management decisions.  A negative result may occur with  improper specimen collection / handling, submission of specimen other  than nasopharyngeal swab, presence of viral mutation(s) within the  areas targeted by this assay, and inadequate number of viral copies  (<250 copies / mL). A negative result must be combined with clinical  observations, patient history, and epidemiological information. If result is POSITIVE SARS-CoV-2 target nucleic acids are DETECTED. The SARS-CoV-2 RNA is generally detectable in upper and lower  respiratory specimens dur ing the acute phase of infection.  Positive  results are indicative of active infection with SARS-CoV-2.  Clinical  correlation with patient history and other diagnostic information is  necessary to determine patient infection status.  Positive results do  not rule out bacterial infection or co-infection with other viruses. If result is PRESUMPTIVE POSTIVE SARS-CoV-2 nucleic acids MAY BE PRESENT.   A presumptive positive result was obtained on the submitted specimen  and confirmed on repeat testing.  While 2019 novel coronavirus  (SARS-CoV-2) nucleic  acids may be present in the submitted sample  additional confirmatory testing may be necessary for epidemiological  and / or clinical management purposes  to differentiate between  SARS-CoV-2 and other Sarbecovirus currently known to infect humans.  If clinically indicated additional testing with an alternate test  methodology [phone]) is advised. The SARS-CoV-2 RNA is generally  detectable in upper and lower respiratory sp ecimens during the acute  phase of infection. The expected result is Negative. Fact Sheet for Patients:  StrictlyIdeas.no Fact Sheet for Healthcare  Providers: BankingDealers.co.za This test is not yet approved or cleared by the [place] FDA and has been authorized for detection and/or diagnosis of SARS-CoV-2 by FDA under an Emergency Use Authorization (EUA).  This EUA will remain in effect (meaning this test can be used) for the duration of the COVID-19 declaration under Section 564(b)(1) of the Act, 21 U.S.C. section 360bbb-3(b)(1), unless the authorization is terminated or revoked sooner. Performed at [facility], [place] [address], [place], [place] [address]          Radiology Studies: Dg Chest 2 View  Result Date: 09/11/2018 CLINICAL DATA:  History of rectal and lung carcinoma with cough and dyspnea EXAM: CHEST - 2 VIEW COMPARISON:  07/23/2018 FINDINGS: Right chest wall port is again seen. Cardiac shadow is stable. The overall inspiratory effort is poor. Chronic fibrotic changes are noted within the lungs bilaterally. These are worse in the left lower lobe than on the right. Mild atelectasis is noted in the right lung base new from the prior exam. No sizable effusion is seen. No acute bony abnormality is noted. IMPRESSION: Right basilar atelectasis superimposed over chronic fibrotic and post radiation changes. Electronically Signed   By: Inez Catalina M.D.   On: 10/06/2018 16:02   Ct Angio Chest Pe W And/or Wo Contrast  Result Date: 09/24/2018 CLINICAL DATA:  Dyspnea.  History of lung cancer. EXAM: CT ANGIOGRAPHY CHEST WITH CONTRAST TECHNIQUE: Multidetector CT imaging of the chest was performed using the standard protocol during bolus administration of intravenous contrast. Multiplanar CT image reconstructions and MIPs were obtained to evaluate the vascular anatomy. CONTRAST:  120mL OMNIPAQUE IOHEXOL 350 MG/ML SOLN COMPARISON:  CT scan of July 23, 2018.  CT scan of April 19, 2018. FINDINGS: Cardiovascular: Satisfactory opacification of the pulmonary arteries to the segmental level. No  evidence of pulmonary embolism. Normal heart size. No pericardial effusion. Mediastinum/Nodes: Moderate size sliding-type hiatal hernia is noted. Thyroid gland is unremarkable. 1.7 cm left hilar lymph node is noted which is slightly smaller compared to prior exam. Lungs/Pleura: No pneumothorax or pleural effusion is noted. Significantly increased lung opacities are noted concerning for worsening edema or possibly inflammation. Upper Abdomen: Cholelithiasis is noted. Musculoskeletal: No chest wall abnormality. No acute or significant osseous findings. Review of the MIP images confirms the above findings. IMPRESSION: No definite evidence of pulmonary embolus. Significantly increased lung opacities are noted bilaterally concerning for worsening inflammation or edema. Moderate size sliding-type hiatal hernia. 1.7 cm left hilar lymph node is noted which is slightly decreased compared to prior exam. Cholelithiasis. Electronically Signed   By: Marijo Conception M.D.   On: 09/24/2018 20:48        Scheduled Meds:  apixaban  2.5 mg Oral Daily   famotidine  20 mg Oral QHS   pantoprazole  40 mg Oral Daily   sucralfate  1 g Oral QID   tamsulosin  0.4 mg Oral QHS   Continuous Infusions:  sodium chloride  ceFEPime (MAXIPIME) IV 2 g (10/09/18 0543)   [START ON 10/10/2018] vancomycin       LOS: 1 day    Time spent: 25 minutes    Barb Merino, MD [facility] Pager [phone]  If 7PM-7AM, please contact night-coverage [email or web address] Password [email or web address] 10/09/2018, 12:49 PM

## 2018-10-09 NOTE — ED Notes (Signed)
Pt placed on a hospital bed due to high census for comfort. Bed in locked and lowest position and call bell.

## 2018-10-09 NOTE — ED Notes (Signed)
Report given to [place], Therapist, sports.

## 2018-10-09 NOTE — Progress Notes (Addendum)
HEMATOLOGY-ONCOLOGY PROGRESS NOTE  SUBJECTIVE: The patient was sent to the emergency room from the cancer center yesterday due to hypoxia.  CT angiogram of the chest did not show any evidence of PE.  It did show significantly increased lung opacities noted bilaterally concerning for worsening inflammation or edema.  His left hilar lymph node had decreased in size compared to prior exam now measuring 1.7 cm.  Remains on oxygen at the time of my visit.  Reports that breathing has improved.  He has been started on IV antibiotics.  Remains afebrile.  Tachycardia has improved.  Oncology History  Rectal cancer ([place])  02/12/2018 Initial Diagnosis   Rectal cancer ([place])   02/12/2018 Cancer Staging   Staging form: Colon and Rectum, AJCC 8th Edition - Clinical: Stage Unknown (cTX, cNX, cM1) - Signed by Ladell Pier, MD on 02/12/2018   02/21/2018 - 05/29/2018 Chemotherapy   The patient had palonosetron (ALOXI) injection 0.25 mg, 0.25 mg, Intravenous,  Once, 5 of 7 cycles Administration: 0.25 mg (02/21/2018), 0.25 mg (03/13/2018), 0.25 mg (03/28/2018), 0.25 mg (05/02/2018), 0.25 mg (05/16/2018) pegfilgrastim-cbqv (UDENYCA) injection 6 mg, 6 mg, Subcutaneous, Once, 3 of 5 cycles Administration: 6 mg (03/15/2018), 6 mg (05/04/2018), 6 mg (05/18/2018) leucovorin 808 mg in dextrose 5 % 250 mL infusion, 400 mg/m2 = 808 mg, Intravenous,  Once, 6 of 8 cycles Administration: 808 mg (02/21/2018), 808 mg (03/13/2018), 808 mg (03/28/2018), 808 mg (04/18/2018), 808 mg (05/02/2018), 808 mg (05/16/2018) oxaliplatin (ELOXATIN) 170 mg in dextrose 5 % 500 mL chemo infusion, 85 mg/m2 = 170 mg, Intravenous,  Once, 5 of 7 cycles Dose modification: 65 mg/m2 (original dose 85 mg/m2, Cycle 2, Reason: Provider Judgment) Administration: 170 mg (02/21/2018), 130 mg (03/13/2018), 130 mg (03/28/2018), 130 mg (05/02/2018), 130 mg (05/16/2018) fluorouracil (ADRUCIL) chemo injection 800 mg, 400 mg/m2 = 800 mg, Intravenous,  Once, 6 of 8 cycles Administration: 800 mg  (02/21/2018), 800 mg (03/13/2018), 800 mg (03/28/2018), 800 mg (04/18/2018), 800 mg (05/02/2018), 800 mg (05/16/2018) fluorouracil (ADRUCIL) 4,850 mg in sodium chloride 0.9 % 53 mL chemo infusion, 2,400 mg/m2 = 4,850 mg, Intravenous, 1 Day/Dose, 6 of 8 cycles Administration: 4,850 mg (02/21/2018), 4,850 mg (03/13/2018), 4,850 mg (03/28/2018), 4,850 mg (04/18/2018), 4,850 mg (05/02/2018), 4,850 mg (05/16/2018)  for chemotherapy treatment.    Primary malignant neoplasm of bronchus of left lower lobe (H[facility])  07/17/2018 Initial Diagnosis   Lung cancer, lower lobe ([place])   08/05/2018 -  Chemotherapy   The patient had palonosetron (ALOXI) injection 0.25 mg, 0.25 mg, Intravenous,  Once, 4 of 5 cycles Administration: 0.25 mg (08/05/2018), 0.25 mg (08/12/2018), 0.25 mg (09/18/2018) CARBOplatin (PARAPLATIN) 160 mg in sodium chloride 0.9 % 250 mL chemo infusion, 160 mg (100 % of original dose 157.8 mg), Intravenous,  Once, 4 of 5 cycles Dose modification:   (original dose 157.8 mg, Cycle 1), 160 mg (original dose 157.8 mg, Cycle 3, Reason: Provider Judgment), 125.1 mg (original dose 157.8 mg, Cycle 3, Reason: Provider Judgment), 125.1 mg (original dose 157.8 mg, Cycle 6) Administration: 160 mg (08/05/2018), 160 mg (08/12/2018), 130 mg (09/18/2018) PACLitaxel (TAXOL) 90 mg in sodium chloride 0.9 % 250 mL chemo infusion (</= 60m/m2), 45 mg/m2 = 90 mg, Intravenous,  Once, 5 of 6 cycles Dose modification: 35 mg/m2 (original dose 45 mg/m2, Cycle 3, Reason: Provider Judgment) Administration: 90 mg (08/05/2018), 90 mg (08/12/2018), 66 mg (09/06/2018), 66 mg (08/30/2018), 66 mg (09/18/2018)  for chemotherapy treatment.       REVIEW OF SYSTEMS:   Constitutional:  Denies fevers, chills or abnormal weight loss Respiratory: Reports improvement in his shortness of breath Cardiovascular: Denies palpitation, chest discomfort Gastrointestinal:  Denies nausea, heartburn or change in bowel habits Skin: Denies abnormal skin rashes Lymphatics: Denies new  lymphadenopathy or easy bruising Neurological:Denies numbness, tingling or new weaknesses Behavioral/Psych: Mood is stable, no new changes  Extremities: No lower extremity edema All other systems were reviewed with the patient and are negative.  I have reviewed the past medical history, past surgical history, social history and family history with the patient and they are unchanged from previous note.   PHYSICAL EXAMINATION:  Vitals:   10/09/18 0930 10/09/18 1000  BP: 92/62 92/64  Pulse: 92 (!) 102  Resp: (!) 26 (!) 34  Temp:    SpO2: 99% 90%   Filed Weights   09/24/2018 1638  Weight: 158 lb (71.7 kg)    Intake/Output from previous day: 09/29 0701 - 09/30 0700 In: 350 [IV Piggyback:350] Out: -   GENERAL:alert, no distress and comfortable SKIN: skin color, texture, turgor are normal, no rashes or significant lesions EYES: normal, Conjunctiva are pink and non-injected, sclera clear OROPHARYNX:no exudate, no erythema and lips, buccal mucosa, and tongue normal  NECK: supple, thyroid normal size, non-tender, without nodularity LYMPH:  no palpable lymphadenopathy in the cervical, axillary or inguinal LUNGS: Crackles to the bilateral bases HEART: regular rate & rhythm and no murmurs and no lower extremity edema ABDOMEN:abdomen soft, non-tender and normal bowel sounds Musculoskeletal:no cyanosis of digits and no clubbing  NEURO: alert & oriented x 3 with fluent speech, no focal motor/sensory deficits  LABORATORY DATA:  I have reviewed the data as listed CMP Latest Ref Rng & Units 10/09/2018 09/23/2018 09/18/2018  Glucose 70 - 99 mg/dL 96 120(H) 153(H)  BUN 8 - 23 mg/dL '12 17 16  ' Creatinine 0.61 - 1.24 mg/dL 0.85 1.21 1.15  Sodium 135 - 145 mmol/L 139 135 138  Potassium 3.5 - 5.1 mmol/L 4.0 5.1 4.0  Chloride 98 - 111 mmol/L 111 101 103  CO2 22 - 32 mmol/L '23 23 24  ' Calcium 8.9 - 10.3 mg/dL 7.9(L) 9.1 9.3  Total Protein 6.5 - 8.1 g/dL 5.8(L) - 7.2  Total Bilirubin 0.3 - 1.2  mg/dL 0.5 - 0.6  Alkaline Phos 38 - 126 U/L 46 - 65  AST 15 - 41 U/L 27 - 31  ALT 0 - 44 U/L 14 - 15    Lab Results  Component Value Date   WBC 3.5 (L) 10/09/2018   HGB 10.1 (L) 10/09/2018   HCT 32.1 (L) 10/09/2018   MCV 102.2 (H) 10/09/2018   PLT 102 (L) 10/09/2018   NEUTROABS 5.5 09/13/2018    Dg Chest 2 View  Result Date: 09/23/2018 CLINICAL DATA:  History of rectal and lung carcinoma with cough and dyspnea EXAM: CHEST - 2 VIEW COMPARISON:  07/23/2018 FINDINGS: Right chest wall port is again seen. Cardiac shadow is stable. The overall inspiratory effort is poor. Chronic fibrotic changes are noted within the lungs bilaterally. These are worse in the left lower lobe than on the right. Mild atelectasis is noted in the right lung base new from the prior exam. No sizable effusion is seen. No acute bony abnormality is noted. IMPRESSION: Right basilar atelectasis superimposed over chronic fibrotic and post radiation changes. Electronically Signed   By: Inez Catalina M.D.   On: 09/13/2018 16:02   Ct Angio Chest Pe W And/or Wo Contrast  Result Date: 09/29/2018 CLINICAL DATA:  Dyspnea.  History of  lung cancer. EXAM: CT ANGIOGRAPHY CHEST WITH CONTRAST TECHNIQUE: Multidetector CT imaging of the chest was performed using the standard protocol during bolus administration of intravenous contrast. Multiplanar CT image reconstructions and MIPs were obtained to evaluate the vascular anatomy. CONTRAST:  173m OMNIPAQUE IOHEXOL 350 MG/ML SOLN COMPARISON:  CT scan of July 23, 2018.  CT scan of April 19, 2018. FINDINGS: Cardiovascular: Satisfactory opacification of the pulmonary arteries to the segmental level. No evidence of pulmonary embolism. Normal heart size. No pericardial effusion. Mediastinum/Nodes: Moderate size sliding-type hiatal hernia is noted. Thyroid gland is unremarkable. 1.7 cm left hilar lymph node is noted which is slightly smaller compared to prior exam. Lungs/Pleura: No pneumothorax or pleural  effusion is noted. Significantly increased lung opacities are noted concerning for worsening edema or possibly inflammation. Upper Abdomen: Cholelithiasis is noted. Musculoskeletal: No chest wall abnormality. No acute or significant osseous findings. Review of the MIP images confirms the above findings. IMPRESSION: No definite evidence of pulmonary embolus. Significantly increased lung opacities are noted bilaterally concerning for worsening inflammation or edema. Moderate size sliding-type hiatal hernia. 1.7 cm left hilar lymph node is noted which is slightly decreased compared to prior exam. Cholelithiasis. Electronically Signed   By: JMarijo ConceptionM.D.   On: 09/30/2018 20:48    ASSESSMENT AND PLAN: 1. Rectal cancer ? Distal rectal mass noted on colonoscopy 01/21/2018, biopsy confirmed invasive adenocarcinoma, cytokeratin 20 (patchy) and CDX 2+, cytokeratin 7 and TTF-1 negative. PIK3CA, APC, TP53 detected. ? CTs 01/29/2018-new left infrahilar mass, enlarged left hilar lymph node, mild subcarinal lymphadenopathy, confluent ill-defined lesion in area fibrosis at the posterior left lower lobe measuring 2 x 1.5 cm, rectal mass, perirectal lymph nodes ? MRI 01/28/2018-T3bN1tumor located at 6.6 cm from the internal anal sphincter, 2 mesorectal lymph nodes ? Hypermetabolic left infrahilar mass. Hypermetabolic nodule left lower lobe. Focal hypermetabolic activity within the rectum. ? Bronchoscopy 02/07/2018-no endobronchial lesions or abnormal secretions seen. Transbronchial brushings obtained from the left lower lobe nodule. Significant enlargement of the station 7, station 10 L, station 11 L nodes. Needle biopsies were taken from station 7 and 11 L nodes. ? Pathology from the left lower lobe brushings, a level 7 lymph node returned positive for adenocarcinoma, positive for cytokeratin 7 and CDX2, negative for cytokeratin 20 and TTF-1. Fine-needle aspiration EBUS, 7, B with malignant cells consistent  with adenocarcinoma; CCND1 (amp) detected. ? Cycle 1 FOLFOX 02/21/2018 ? Cycle 2 FOLFOX 03/13/2018,Udenyca added ? Cycle 3 FOLFOX 03/28/2018 ? Cycle 4 FOLFOX 04/18/2018 (oxaliplatin held) ? CT chest 04/19/2018-left infrahilar node previously measured 2.6 x 3.4, current 1.8 x 2.0. 11 mm paraesophageal lymph node, previously 14 mm. ? Cycle 5 FOLFOX 05/02/2018 ? Cycle 6 FOLFOX 05/16/2018 ? CT chest, abdomen, and pelvis on 05/28/2018-stable left lower lobe nodule, decreased left infrahilar mediastinal adenopathy, suspicion of right jugular vein thrombus, decreased perirectal lymph node, stable rectal wall thickening, new right ureteropelvic and distal right ureter stones with hydroureteronephrosis ? Radiation/Xeloda 06/10/2018-07/18/2018 2. Pulmonary fibrosis 3. Kidney stones-new stones with right hydroureteronephrosis on CT 05/27/2018 4. BPH 5. History of gastroesophageal reflux 6. Mild renal insufficiency 7. Port-A-Cath placement 02/14/2018 8. Neutropenia and thrombocytopenia secondary to chemotherapy, Udenyca added with cycle 2 and oxaliplatin dose reduced 9. Tachycardia-chest CT 04/19/2018 negative for pulmonary embolus; underlying fibrotic lung disease again noted; slight increase patchy groundglass attenuation right upper lobe. Echocardiogram 04/23/2018-calculated EF 64%. Tachycardia improved 05/02/2018 10. Cystoscopy/right ureteroscopic stone extraction 06/10/2018 11. 06/04/2018 right upper extremity venous Doppler-acute deep vein thrombosis involving the right  internal jugular veins. Eliquis initiated 12. Non-small cell lung cancer-hypermetabolic left infrahilar mass, and left lower lobe nodule  Pathology from the left lower lobe brushings, a level 7 lymph node returned positive for adenocarcinoma, positive for cytokeratin 7 and CDX2, negative for cytokeratin 20 and TTF-1. Fine-needle aspiration EBUS, 7, B with malignant cells consistent with adenocarcinoma; CCND1 (amp) detected.  Weekly  Taxol/carboplatin radiation beginning 08/05/2018  Chemotherapy delayed beginning 08/19/2018 secondary to neutropenia and thrombocytopenia  Week 3 chemotherapy 08/30/2018 with single agent Taxol  Week 4 chemotherapy 09/06/2018 with single agent Taxol  Radiation completed 09/17/2018  Week 5 chemotherapy 09/18/2018 with Taxol/carboplatin 13.  Hospital admission 09/25/2018 for hypoxia and pneumonia versus radiation pneumonitis  Mr. Jindra is being admitted for hypoxia and pneumonia.  His breathing has improved.  CT imaging has been reviewed and shows improvement of his cancer.  There is infection/inflammation.  He is being treated for pneumonia but cannot rule out a component of radiation pneumonitis.  He has mild pancytopenia likely related to chemotherapy administered earlier this month.  Recommendations: 1.  Will ask radiation oncology to evaluate the patient for concerns of radiation pneumonitis.  I have placed a consult for radiation oncology. 2.  Continue IV antibiotics. 3.  Wean oxygen as tolerated. 4.  Consider pulmonary consult-he plans to establish with pulmonary here for management of fibrosis 5.  Test for influenza   LOS: 1 day   Mikey Bussing, DNP, AGPCNP-BC, AOCNP 10/09/18 Mr. Bostelman was admitted yesterday with dyspnea and hypoxia.  A chest CT confirms confluent lower lung infiltrates.  The differential diagnosis includes an infection and less likely radiation pneumonitis or tumor spread.  He is scheduled for rectal surgery at [facility] next month, but may not be a candidate unless his pulmonary status improves.

## 2018-10-09 NOTE — Progress Notes (Signed)
Pharmacy Antibiotic Note  Austin Yu is a 73 y.o. male with hx idiopathic pulmonary fibrosis and NSCL on chemotherapy treatment PTA, presented to the ED on 10/07/2018 with SOB. Chest CTA was negative for PE.  He was started on vancomycin and cefepime for suspected PNA.  Today, 10/09/2018: - afeb, wbc wnl - scr down 0.85 (crcl~75) --> NS at 75 ml/hr   Plan: - adjust vancomycin to 1500 mg IV q24h (est AUC 437) - increase cefepime to 2 gm IV q8h - f/u renal function and MRSA PCR ____________________________________________  Height: 5\' 7"  (170.2 cm) Weight: 158 lb (71.7 kg) IBW/kg (Calculated) : 66.1  Temp (24hrs), Avg:98.2 F (36.8 C), Min:98 F (36.7 C), Max:98.3 F (36.8 C)  Recent Labs  Lab 09/24/2018 1420 09/27/2018 1646 10/09/18 0526  WBC 6.7 7.3 3.5*  CREATININE 1.15 1.21 0.85    Estimated Creatinine Clearance: 74.5 mL/min (by C-G formula based on SCr of 0.85 mg/dL).    No Known Allergies   Thank you for allowing pharmacy to be a part of this patient's care.  Lynelle Doctor 10/09/2018 2:07 PM

## 2018-10-09 NOTE — ED Notes (Signed)
Sent message to Hospitalist regarding downgrading bed

## 2018-10-09 NOTE — Telephone Encounter (Signed)
  Radiation Oncology         [phone]) [phone] ________________________________  Name: Austin Yu MRN: [id number]  Date of Service: 10/09/2018  DOB: 10-07-1945  Post Treatment Telephone Note  Diagnosis:   Stage III NSCLC, adenocarcinoma of the left infrahilar region, Stage IIIB, cT3N1bMx adenocarcinoma of the rectum  Interval Since Last Radiation:  3 weeks   7/27/20020-09/17/2018:  The left lung and regional nodes were treated to 60 Gy in 30 fractions  06/10/2018 through 07/18/2018: The patient was treated to the pelvis to a dose of 45 Gy at 1.8 Gy per fraction. This was accomplished using a 4 field 3-D conformal technique. The patient then received a boost to the tumor and adjacent high-risk regions for an additional 5.4 Gy at 1.8 gray per fraction. This was carried out using a coned-down 4 field approach. The patient's total dose was 50.4 Gy. Daily AlignRT was used on a daily basis to insure proper patient positioning and localization of critical targets/ structures. The patient received concurrent chemotherapy during the course of radiation treatment.  Narrative:  The patient was on my call list to follow up for a 1 month appointment next week. He is about 3 weeks out from completing radiotherapy to the chest for a Stage III lung cancer. He presented with a synchronous Stage III rectal cancer as well. He completed chemoRT to the rectum and pelvis on 07/18/2018, then proceeded with chemoRT to the chest which he finished on 09/17/2018. He is scheduled to undergo surgical resection of his rectum on 10/29/2018. I was notified by Dr. Benay Spice that the patient presented to the ED overnight with SOB. He is still being worked up, but negative for Covid, negative for PE, and on CT chest, they saw bilateral opacities that appeared to be consistent with infection or inflammatory features.    Impression/Plan: 1. Stage III NSCLC, adenocarcinoma of the left infrahilar region with new onset of shortness of breath. The  patient tolerated radiotherapy quite well despite esophagitis. He is currently hospitalized for shortness of breath. Dr. Lisbeth Renshaw has also reviewed his scans and the bilateral features do not appear to be consistent with radiation pneumonitis or with the timing of his symptoms. We will follow along to see how he does. I will ask Dr. Benay Spice his thoughts on flu testing as well. He does report his esophagitis has resolved and he's been able to eat much more so than just a few weeks ago. 2. Stage IIIB, cT3N1bMx adenocarcinoma of the rectum. The patient completed chemoRT and is planning on having laparoscopic LAR with diverting loop ileostomy with Dr. Ronita Hipps at [facility] on 10/29/18. We will follow along expectantly with this course. I will copy Dr. Ronita Hipps and Dr. Ruthann Cancer at [facility] so they're aware of the patient's status.      Carola Rhine, PAC

## 2018-10-10 ENCOUNTER — Inpatient Hospital Stay (HOSPITAL_COMMUNITY): Payer: PPO

## 2018-10-10 DIAGNOSIS — I371 Nonrheumatic pulmonary valve insufficiency: Secondary | ICD-10-CM

## 2018-10-10 DIAGNOSIS — J9601 Acute respiratory failure with hypoxia: Secondary | ICD-10-CM | POA: Diagnosis present

## 2018-10-10 LAB — RESPIRATORY PANEL BY PCR

## 2018-10-10 LAB — COMPREHENSIVE METABOLIC PANEL
ALT: 15 U/L (ref 0–44)
AST: 31 U/L (ref 15–41)
Albumin: 2.7 g/dL — ABNORMAL LOW (ref 3.5–5.0)
Alkaline Phosphatase: 54 U/L (ref 38–126)
Anion gap: 10 (ref 5–15)
BUN: 10 mg/dL (ref 8–23)
CO2: 23 mmol/L (ref 22–32)
Calcium: 8.5 mg/dL — ABNORMAL LOW (ref 8.9–10.3)
Chloride: 105 mmol/L (ref 98–111)
Creatinine, Ser: 1.07 mg/dL (ref 0.61–1.24)
GFR calc Af Amer: >60 mL/min (ref >60)
GFR calc non Af Amer: >60 mL/min (ref >60)
Glucose, Bld: 107 mg/dL — ABNORMAL HIGH (ref 70–99)
Potassium: 3.5 mmol/L (ref 3.5–5.1)
Sodium: 138 mmol/L (ref 135–145)
Total Bilirubin: 0.8 mg/dL (ref 0.3–1.2)
Total Protein: 6.1 g/dL — ABNORMAL LOW (ref 6.5–8.1)

## 2018-10-10 LAB — CBC WITH DIFFERENTIAL/PLATELET
Abs Immature Granulocytes: 0.03 10*3/uL (ref 0.00–0.07)
Basophils Absolute: 0 10*3/uL (ref 0.0–0.1)
Basophils Relative: 1 %
Eosinophils Absolute: 0.1 10*3/uL (ref 0.0–0.5)
Eosinophils Relative: 2 %
HCT: 32.5 % — ABNORMAL LOW (ref 39.0–52.0)
Hemoglobin: 10.4 g/dL — ABNORMAL LOW (ref 13.0–17.0)
Immature Granulocytes: 1 %
Lymphocytes Relative: 8 %
Lymphs Abs: 0.5 10*3/uL — ABNORMAL LOW (ref 0.7–4.0)
MCH: 32 pg (ref 26.0–34.0)
MCHC: 32 g/dL (ref 30.0–36.0)
MCV: 100 fL (ref 80.0–100.0)
Monocytes Absolute: 0.6 10*3/uL (ref 0.1–1.0)
Monocytes Relative: 9 %
Neutro Abs: 5 10*3/uL (ref 1.7–7.7)
Neutrophils Relative %: 79 %
Platelets: 102 10*3/uL — ABNORMAL LOW (ref 150–400)
RBC: 3.25 MIL/uL — ABNORMAL LOW (ref 4.22–5.81)
RDW: 16.1 % — ABNORMAL HIGH (ref 11.5–15.5)
WBC: 6.3 10*3/uL (ref 4.0–10.5)
nRBC: 0 % (ref 0.0–0.2)

## 2018-10-10 LAB — TROPONIN I (HIGH SENSITIVITY): Troponin I (High Sensitivity): 14 ng/L (ref <18)

## 2018-10-10 LAB — PROCALCITONIN: Procalcitonin: <0.1 ng/mL

## 2018-10-10 LAB — ECHOCARDIOGRAM COMPLETE
Height: 67 in
Weight: 2528 oz

## 2018-10-10 LAB — BRAIN NATRIURETIC PEPTIDE: B Natriuretic Peptide: 107 pg/mL — ABNORMAL HIGH (ref 0.0–100.0)

## 2018-10-10 LAB — LEGIONELLA PNEUMOPHILA SEROGP 1 UR AG: L. pneumophila Serogp 1 Ur Ag: NEGATIVE

## 2018-10-10 LAB — LACTIC ACID, PLASMA: Lactic Acid, Venous: 0.9 mmol/L (ref 0.5–1.9)

## 2018-10-10 LAB — SEDIMENTATION RATE: Sed Rate: 78 mm/hr — ABNORMAL HIGH (ref 0–16)

## 2018-10-10 MED ORDER — METHYLPREDNISOLONE SODIUM SUCC 125 MG IJ SOLR
125.0000 mg | Freq: Four times a day (QID) | INTRAMUSCULAR | Status: DC
  Administered 2018-10-10 – 2018-10-14 (×15): 125 mg via INTRAVENOUS
  Filled 2018-10-10 (×15): qty 2

## 2018-10-10 MED ORDER — SODIUM CHLORIDE 0.9% FLUSH: 10.0000 mL | INTRAVENOUS | Status: DC | PRN

## 2018-10-10 MED ORDER — ENSURE ENLIVE PO LIQD: 237.0000 mL | Freq: Two times a day (BID) | ORAL | Status: DC

## 2018-10-10 MED ORDER — LEVALBUTEROL HCL 0.63 MG/3ML IN NEBU: 0.6300 mg | INHALATION_SOLUTION | Freq: Three times a day (TID) | RESPIRATORY_TRACT | Status: DC | PRN

## 2018-10-10 MED ORDER — FUROSEMIDE 10 MG/ML IJ SOLN
INTRAMUSCULAR | Status: AC
  Administered 2018-10-10: 08:00:00
  Filled 2018-10-10: qty 4

## 2018-10-10 MED ORDER — ACETAMINOPHEN 325 MG PO TABS
650.0000 mg | ORAL_TABLET | Freq: Four times a day (QID) | ORAL | Status: DC | PRN
  Administered 2018-10-10: 650 mg via ORAL
  Filled 2018-10-10: qty 2

## 2018-10-10 MED ORDER — ORAL CARE MOUTH RINSE
15.0000 mL | Freq: Two times a day (BID) | OROMUCOSAL | Status: DC
  Administered 2018-10-11 – 2018-10-15 (×8): 15 mL via OROMUCOSAL

## 2018-10-10 MED ORDER — ADULT MULTIVITAMIN W/MINERALS CH
1.0000 | ORAL_TABLET | Freq: Every day | ORAL | Status: DC
  Administered 2018-10-11 – 2018-10-15 (×5): 1 via ORAL
  Filled 2018-10-10 (×5): qty 1

## 2018-10-10 MED ORDER — SODIUM CHLORIDE 0.9 % IV BOLUS
500.0000 mL | Freq: Once | INTRAVENOUS | Status: AC
  Administered 2018-10-10: 500 mL via INTRAVENOUS

## 2018-10-10 MED ORDER — PANTOPRAZOLE SODIUM 40 MG PO TBEC
40.0000 mg | DELAYED_RELEASE_TABLET | Freq: Two times a day (BID) | ORAL | Status: DC
  Administered 2018-10-10 – 2018-10-13 (×6): 40 mg via ORAL
  Filled 2018-10-10 (×6): qty 1

## 2018-10-10 MED ORDER — FUROSEMIDE 10 MG/ML IJ SOLN
40.0000 mg | Freq: Once | INTRAMUSCULAR | Status: AC
  Administered 2018-10-10: 40 mg via INTRAVENOUS

## 2018-10-10 MED ORDER — BOOST / RESOURCE BREEZE PO LIQD CUSTOM
1.0000 | ORAL | Status: DC
  Administered 2018-10-11 – 2018-10-15 (×5): 1 via ORAL

## 2018-10-10 NOTE — Progress Notes (Signed)
Initial Nutrition Assessment  RD working remotely.   DOCUMENTATION CODES:   Not applicable  INTERVENTION:  - will order Boost Breeze once/day, each supplement provides 250 kcal and 9 grams of protein. - will order Ensure Enlive BID, each supplement provides 350 kcal and 20 grams of protein. - will order daily multivitamin with minerals. - continue to encourage PO intakes.  - will complete NFPE and assess for malnutrition at follow-up.    NUTRITION DIAGNOSIS:   Increased nutrient needs related to chronic illness, acute illness as evidenced by estimated needs.  GOAL:   Patient will meet greater than or equal to 90% of their needs  MONITOR:   PO intake, Supplement acceptance, Labs, Weight trends  REASON FOR ASSESSMENT:   Malnutrition Screening Tool  ASSESSMENT:   73 year old male with history of hyperlipidemia, BPH, GERD, rectal cancer diagnosed on 01/2018, right jugular vein DVT, pulmonary fibrosis not on home oxygen, lung cancer on chemotherapy and radiation therapy. He presented to the ED from the [facility] on 9/30 due to non-productive cough and progressive SOB. In the ED he required 3L O2. CT abdomen was concerning for bilateral lower lobe opacities and worsening inflammation or edema.  Patient transferred from [address] [facility] [place] earlier this afternoon. No intakes documented since admission. Patient was seen by [facility] on 2/5 and 3/19. At that time, patient had been recently diagnosed and started on chemo. At that time his appetite had been good and he was eating well without any issues.  Per chart review, current weight is 158 lb. Weight on 8/3 was 179 lb. This indicates 21 lb weight loss (11.7% body weight) in the past 2 months; significant for time frame. Highly suspect patient meets criteria for some degree of malnutrition but unable to confirm at this time.   Per notes: - bacterial PNA - stage 3 lung cancer, lymphadenopathy, previous radiation injury -  acute hypoxic respiratory failure - pulmonary fibrosis - rectal cancer    Labs reviewed; Ca: 8.5 mg/dl. Medications reviewed; 20 mg oral pepcid/day, 40 mg IV lasix x1 dose 10/1, 125 mcg solu-medrol QID, 40 mg oral protonix BID, 1 g carafate QID.     NUTRITION - FOCUSED PHYSICAL EXAM:  unable to complete at this time.   Diet Order:   Diet Order            Diet NPO time specified Except for: Ice Chips, Sips with Meds  Diet effective now              EDUCATION NEEDS:   No education needs have been identified at this time  Skin:  Skin Assessment: Reviewed RN Assessment  Last BM:  9/29  Height:   Ht Readings from Last 1 Encounters:  09/16/2018 5\' 7"  (1.702 m)    Weight:   Wt Readings from Last 1 Encounters:  09/17/2018 71.7 kg    Ideal Body Weight:  67.3 kg  BMI:  Body mass index is 24.75 kg/m.  Estimated Nutritional Needs:   Kcal:  1900-2100 kcal  Protein:  95-110 grams  Fluid:  >/= 2 L/day      Jarome Matin, MS, RD, LDN, [facility] Inpatient Clinical Dietitian Pager # [phone] After hours/weekend pager # [phone]

## 2018-10-10 NOTE — Progress Notes (Signed)
AM Assessment demonstrates Pt with increased work of breathing and increased heart rate. Pt is on Oxygen at 5 l [place] with O2 sats 80 on 5l [place]. Pt with c/o shortness of breath with rest. Temperature 100.0 orally. MD paged and rapid response RN paged.

## 2018-10-10 NOTE — Progress Notes (Signed)
  Echocardiogram 2D Echocardiogram has been performed.  Austin Yu 10/10/2018, 4:29 PM

## 2018-10-10 NOTE — Progress Notes (Signed)
MD has seen and examined Pt. Rapid Response RN has assessed Pt. New orders entered by MD. Pt now on 10 l high flow oxygen with O2 saturations 95%. IV Lasix x 1 given per orders and Tylenol for temperature. Blood Cultures have been drawn. Pt reports less shortness of breath with high flow oxygen. Maintain current plan of care and Monitor Pt closley

## 2018-10-10 NOTE — Consult Note (Signed)
NAME:  Austin Yu, MRN:  [id number], DOB:  09-02-1945, LOS: 2 ADMISSION DATE:  09/11/2018, CONSULTATION DATE:  10/1 REFERRING MD:  Dr. Sloan Leiter, CHIEF COMPLAINT:  SOB   Brief History   73 year old male admitted 9/29 with shortness of breath.  History of present illness   73 y/o M who presented to [facility] on 9/29 with shortness of breath.    Patient has medical history of adenocarcinoma stage IIIa of the lung with most recent chemotherapy (carboplatin/Taxol- 09/18/2018 last dose) and radiation (last dose 09/17/2018) in mid September 2020.  He reports his shortness of breath began approximately 3 weeks ago. Prior to this he was able to perform all ADLs independently without significant dyspnea.  He notes decreased energy and appetite.  He endorses acid reflux for which he is tried small-volume meals with higher frequency.  He denies fevers cough, sputum production.    He was seen in the oncology office on 9/29 for follow-up.  At that time there were concerns for possible dehydration and hypoxemia with room air saturations to 74%.  He was treated with 2 L O2 with improvement into the 90s.  Patient was not previously on oxygen prior to this evaluation.  He was referred to the emergency room for evaluation of hypoxemia and tachycardia.  CT angios was completed of the chest to rule out PE which was negative but showed significantly increased pulmonary opacities bilaterally, moderate sliding-type hiatal hernia and a 1.7 cm left hilar lymph node which was decreased compared to prior exam.  He was treated empirically with Rocephin and azithromycin in the emergency room.  EKG was without evidence for ischemia.  He was admitted per [facility] for further evaluation.  Patient was treated on 10/1 with IV Lasix.  He developed hypotension and was transferred to stepdown for close observation.  The patient continues to require salter O2 at 10 L.  PCCM consulted for evaluation of hypoxemic respiratory failure.  PCT negative,  ESR 78 (up from 31 in 2018).   See PCCM ILD Questionnaire below.   Past Medical History  Lung Cancer - Adenocarcinoma Stage IIIa Lung Cancer, EBUS 02/07/18 per Dr. Lamonte Sakai  Idiopathic Pulmonary Fibrosis - followed by Dr. Melvyn Novas  Rectal Cancer -diagnosed 01/21/2018, biopsy positive for invasive adenocarcinoma BPH  DVT - R IJ, on eliquis  GERD [facility] Events   09/29 Admit  10/01 Transferred to SDU for hypotension post lasix   Consults:  PCCM 10/1  Procedures:     Significant Diagnostic Tests:  CTA Chest 9/29 >> neg for PE, significantly increased lung opacities noted bilaterally concerning for worsening inflammation or edema, moderate sliding-type hiatal hernia, 1.7cm left hilar lymph note, decreased from prior exam  Micro Data:  COVID 9/29 >> negative  BCx2 10/1 >>  RVP 10/1 >>   Antimicrobials:  Azithro 9/29 >> 9/30  Vanco 9/29 >>  Cefepime 9/30 >>   Interim history/subjective:  As above.  RN reports episodes of desaturation with exertion.  Afebrile.   Objective   Blood pressure (!) 86/62, pulse (!) 110, temperature 97.7 F (36.5 C), temperature source Oral, resp. rate 20, height _0  (1.702 m), weight 71.7 kg, SpO2 96 %.        Intake/Output Summary (Last 24 hours) at 10/10/2018 1352 Last data filed at 10/10/2018 1020 Gross per 24 hour  Intake 1508.81 ml  Output 2600 ml  Net -1091.19 ml   Filed Weights   09/16/2018 1638  Weight: 71.7 kg  Examination: General:  Elderly male lying in bed in NAD HEENT: MM pink/moist, Joice O2 Neuro: AAOx4, speech clear, MAE  CV: s1s2 rrr, no m/r/g PULM: Mild dyspnea with conversation, lungs bilaterally with dry bibasilar crackles  GI: soft, bsx4 active  Extremities: warm/dry, no edema  Skin: no rashes or lesions  Resolved Hospital Problem list     Assessment & Plan:   Acute Hypoxemic Respiratory Failure  -patient is a never smoker with hx of IIIa adenocarcinoma of the lung, recently completed  chemotherapy and XRT 09/18/18.  He has underlying ILD at baseline but little symptom burden from it before chemo/XRT.  Developed gradual shortness of breath in the last 3 weeks.  Radiographic review shows worsening of background ground glass / opacities bilaterally since May 2020.  ESR is elevated and PCT negative.  Suspect infiltrates may be inflammatory in nature.  He has acid reflux and sliding hiatal hernia which puts him at risk for recurrent aspiration.    ILD -not on O2 at baseline   At Risk Aspiration -possible aspiration event with water 10/1 in setting of dyspnea  P: Stop antibiotics Follow fever curve / WBC trend  Begin steroids, 167m IV Q6 NPO x4 hours  Wean O2 for sats > 90%.  Likely slow to recover with desaturations.  Allow time for exertion / energy expenditure.  Pulmonary hygiene - IS, mobilize  May be more comfortable with heated high flow O2 Max GERD Rx, increase PPI to BID  Aspiration precautions  Consider swallow evaluation once respiratory status / work of breathing improved  Hypotension  -suspect secondary to lasix / volume removal P: Hold further lasix for now SDU monitoring   Best practice:  Diet: NPO x4 hours, sips/chips ok Pain/Anxiety/Delirium protocol (if indicated): n/a  VAP protocol (if indicated): n/a  DVT prophylaxis: apixaban  GI prophylaxis: Pepcid, protonix  Glucose control: n/a  Mobility: as tolerated  Code Status: Full Code  Family Communication: Patient updated on plan of care  Disposition: SDU  Labs   CBC: Recent Labs  Lab 09/11/2018 1420 09/11/2018 1646 10/09/18 0526 10/10/18 1109  WBC 6.7 7.3 3.5* 6.3  NEUTROABS 5.0 5.5  --  5.0  HGB 12.2* 13.1 10.1* 10.4*  HCT 37.3* 41.3 32.1* 32.5*  MCV 98.7 101.2* 102.2* 100.0  PLT 153 137* 102* 102*    Basic Metabolic Panel: Recent Labs  Lab 09/10/2018 1420 09/23/2018 1646 10/09/18 0526 10/10/18 1109  NA 138 135 139 138  K 4.0 5.1 4.0 3.5  CL 103 101 111 105  CO2 _0 GLUCOSE 153* 120* 96 107*  BUN _1 CREATININE 1.15 1.21 0.85 1.07  CALCIUM 9.3 9.1 7.9* 8.5*   GFR: Estimated Creatinine Clearance: 59.2 mL/min (by C-G formula based on SCr of 1.07 mg/dL). Recent Labs  Lab 09/13/2018 1420 09/23/2018 1646 10/09/18 0526 10/10/18 1007 10/10/18 1109  PROCALCITON  --   --   --  <0.10  --   WBC 6.7 7.3 3.5*  --  6.3  LATICACIDVEN  --   --   --   --  0.9    Liver Function Tests: Recent Labs  Lab 09/17/2018 1420 10/09/18 0526 10/10/18 1109  AST _2 ALT _3 ALKPHOS 65 46 54  BILITOT 0.6 0.5 0.8  PROT 7.2 5.8* 6.1*  ALBUMIN 3.0* 2.6* 2.7*   No results for input(s): LIPASE, AMYLASE in the last 168 hours. No results for input(s): AMMONIA in the  last 168 hours.  ABG No results found for: PHART, PCO2ART, PO2ART, HCO3, TCO2, ACIDBASEDEF, O2SAT   Coagulation Profile: No results for input(s): INR, PROTIME in the last 168 hours.  Cardiac Enzymes: No results for input(s): CKTOTAL, CKMB, CKMBINDEX, TROPONINI in the last 168 hours.  HbA1C: No results found for: HGBA1C  CBG: No results for input(s): GLUCAP in the last 168 hours.  Review of Systems: Positives in [place]   Gen: Denies fever, chills, weight change, fatigue, night sweats HEENT: Denies blurred vision, double vision, hearing loss, tinnitus, sinus congestion, rhinorrhea, sore throat, neck stiffness, dysphagia PULM: Denies shortness of breath, cough, sputum production, hemoptysis, wheezing CV: Denies chest pain, edema, orthopnea, paroxysmal nocturnal dyspnea, palpitations GI: Denies abdominal pain, nausea, vomiting, diarrhea, hematochezia, melena, constipation, change in bowel habits GU: Denies dysuria, hematuria, polyuria, oliguria, urethral discharge Endocrine: Denies hot or cold intolerance, polyuria, polyphagia or appetite change Derm: Denies rash, dry skin, scaling or peeling skin change Heme: Denies easy bruising, bleeding, bleeding gums Neuro: Denies headache,  numbness, weakness, slurred speech, loss of memory or consciousness   Past Medical History  He,  has a past medical history of Abnormal chest sounds, BPH (benign prostatic hypertrophy), Cancer (HCC), Complication of anesthesia, DVT (deep venous thrombosis) (HCC), Dyspnea, Elevated PSA, GERD (gastroesophageal reflux disease), Hematuria, History of airway aspiration, History of kidney stones, Hyperlipidemia, Hyperplasia of prostate without lower urinary tract symptoms (LUTS), Left ureteral stone, Lung nodule, Nephrolithiasis, Postinflammatory pulmonary fibrosis ([place]), Pulmonary fibrosis ([place]), Rectal cancer ([place]), Sinus tachycardia by electrocardiogram, Strawberry hemangioma of skin, and Upper airway cough syndrome.   Surgical History    Past Surgical History:  Procedure Laterality Date  . BIOPSY  01/21/2018   Procedure: BIOPSY;  Surgeon: Doran Stabler, MD;  Location: [facility];  Service: Gastroenterology;;  . COLONOSCOPY WITH PROPOFOL N/A 01/21/2018   Procedure: COLONOSCOPY WITH PROPOFOL;  Surgeon: Doran Stabler, MD;  Location: [facility];  Service: Gastroenterology;  Laterality: N/A;  . CYSTOSCOPY WITH HOLMIUM LASER LITHOTRIPSY Left 06/01/2015   Procedure: CYSTOSCOPY WITH HOLMIUM LASER LITHOTRIPSY;  Surgeon: Irine Seal, MD;  [facility];  Service: Urology;  Laterality: Left;  . CYSTOSCOPY WITH RETROGRADE PYELOGRAM, URETEROSCOPY AND STENT PLACEMENT Right 06/10/2018   Procedure: CYSTOSCOPY WITH RIGHT RETROGRADE PYELOGRAM, URETEROSCOPY HOLMIUM LASER AND STENT PLACEMENT basket extraction stone;  Surgeon: Irine Seal, MD;  Location: [facility];  Service: Urology;  Laterality: Right;  . CYSTOSCOPY WITH URETEROSCOPY, STONE BASKETRY AND STENT PLACEMENT Left 05/18/2015   Procedure: CYSTOSCOPY WITH STENT PLACEMENT AND RETROGRADE PYELOGRAM;  Surgeon: Irine Seal, MD;  Location: [facility];  Service: Urology;  Laterality: Left;  . CYSTOSCOPY WITH URETEROSCOPY, STONE BASKETRY AND STENT  PLACEMENT Left 06/01/2015   Procedure: CYSTOSCOPY WITH URETEROSCOPY, STONE BASKETRY AND STENT PLACEMENT;  Surgeon: Irine Seal, MD;  Location[facility];  Service: Urology;  Laterality: Left;  . CYSTOSCOPY/URETEROSCOPY/HOLMIUM LASER/STENT PLACEMENT Right 08/01/2018   Procedure: CYSTOSCOPY/URETEROSCOPY/HOLMIUM LASER/STENT EXCHANGE;  Surgeon: Irine Seal, MD;  Location: [facility];  Service: Urology;  Laterality: Right;  . EXTRACORPOREAL SHOCK WAVE LITHOTRIPSY  2007  . IR IMAGING GUIDED PORT INSERTION  02/14/2018  . TONSILLECTOMY  age [age]  . VIDEO BRONCHOSCOPY WITH ENDOBRONCHIAL NAVIGATION Left 02/07/2018   Procedure: VIDEO BRONCHOSCOPY WITH ENDOBRONCHIAL NAVIGATION;  Surgeon: Collene Gobble, MD;  Location: [facility];  Service: Thoracic;  Laterality: Left;  Marland Kitchen VIDEO BRONCHOSCOPY WITH ENDOBRONCHIAL ULTRASOUND Left 02/07/2018   Procedure: VIDEO BRONCHOSCOPY WITH ENDOBRONCHIAL ULTRASOUND;  Surgeon: Collene Gobble, MD;  Location: [facility];  Service: Thoracic;  Laterality: Left;     Social History   reports that he has never smoked. He has never used smokeless tobacco. He reports previous alcohol use. He reports that he does not use drugs.   Family History   His family history includes Healthy in his brother, daughter, and daughter; Heart failure in his father.   Allergies No Known Allergies   Home Medications  Prior to Admission medications   Medication Sig Start Date End Date Taking? Authorizing Provider  apixaban (ELIQUIS) 2.5 MG TABS tablet Take 2.5 mg by mouth daily.    Yes [provider]  famotidine (PEPCID) 20 MG tablet Take 1 tablet (20 mg total) by mouth at bedtime. 08/01/17  Yes Tanda Rockers, MD  lidocaine-prilocaine (EMLA) cream Apply 1 application topically as needed (port access).   Yes [provider]  pantoprazole (PROTONIX) 40 MG tablet Take 1 tablet (40 mg total) by mouth daily. 04/22/18  Yes Tanda Rockers, MD  tamsulosin (FLOMAX) 0.4 MG CAPS capsule Take  0.4 mg by mouth at bedtime.  05/07/15  Yes [provider]     Critical care time: n/a    Noe Gens, NP-C [facility] Pgr: [phone] or if no answer [phone] 10/10/2018, 1:52 PM    [place] Integrated Comprehensive ILD Questionnaire  Symptoms:  Patient reports he has had a gradual shortness of breath for approximately 2 years with significant increase in the last 3 weeks.  He denies repeated sudden attacks of shortness of breath.  In the last 2 weeks he notes significant difficulty with ADLs rating it 5 out of 0-5 scale.   Past Medical History : Medical history as above.  Denies family history of pulmonary fibrosis, autoimmune or connective tissue diseases.  Denies asthma, COPD, heart failure, rheumatoid arthritis, scleroderma, systemic lupus erythematous, polymyositis, Sjogren's, OSA, pulmonary hypertension, diabetes, thyroid disease, stroke, seizure, mononucleosis, hepatitis B or C, tuberculosis, kidney disease, heart disease and pleurisy.  Has a history of internal jugular DVT on Eliquis, lung cancer, rectal cancer, acid reflux.   ROS:  Patient reports cough for approximately 7 to 8 years that has been consistently the same.  He rates it as moderate.  He reports cough at night and only occasional clear sputum production denies hemoptysis.  He reports the cough is worse when he lies down.  He does note he has a history of acid reflux.  He reports frequent clearing of his throat and a tickle in the back of his throat.  He does note that he has occasional wheezing.  He reports wheezing a 2 out of 10 with 10 being the most severe.  Reports that drinking water helps with symptoms and laughing makes it worse.  Reports fatigue has increased over the last several weeks that he feels is related to his chemotherapy and radiation.  He notes feeling good after taking his steroids that was associated with chemotherapy.  He felt as if he had increased energy and  appetite.  Denies dry eye, difficulty swallowing food, pain or changes of color in his fingers with cold weather, denies fever.  Reports 10 pound weight loss in the last month.  Denies rashes.   FAMILY HISTORY of LUNG DISEASE:  None   EXPOSURE HISTORY:  Denies military experience or occupational exposures.  Never smoker. Reports he smoked marijuana once per week in 1968 02/27/1982.  He has never used cocaine or intravenous street drugs.  HOME and HOBBY DETAILS :  Plays golf. Specifically denies pet birds, Curator, cigar smoking, vaping, woodworking, textile work, asbestos exposure.   OCCUPATIONAL HISTORY (122 questions) :  Worked as a Geophysical data processor.   Reports he worked predominantly in an air conditioned space.  No other occupational exposures.   PULMONARY TOXICITY HISTORY (27 items): Recently completed chemotherapy and radiation in September 2020 PFT's 2-3 years ago

## 2018-10-10 NOTE — Progress Notes (Signed)
PROGRESS NOTE    Austin Yu  VHQ:[id number] DOB: 10/30/1945 DOA: 09/19/2018 PCP: Deland Pretty, MD    Brief Narrative:  73 year old male with history of hyperlipidemia, BPH, GERD, rectal cancer diagnosed on 01/2018, right jugular vein DVT, pulmonary fibrosis not on home oxygen, lung cancer on chemotherapy and radiation therapy presented to the emergency room from cancer center with nonproductive cough and progressive shortness of breath.  In the emergency room, he was on 3 L oxygen, CTA of the lungs showed bilateral lower lobe opacities concerning for worsening inflammation or edema.  He has perihilar lymphadenopathy. 09/10/2018: Patient started requiring high levels of oxygen.  Currently on 6 L.  Subjective: Patient seen and examined.  Rapid response was called at the bedside in the morning as the patient required more than 6 L of oxygen, complained of shortness of breath.  Patient was evaluated at the bedside. Blood pressures were more than 413 systolic, he was 24% on 6 L oxygen, had fine bilateral crackles.  Gave a dose of Lasix with improvement. Due to significant oxygen requirement, transferred to the stepdown unit.  Discussed with pulmonary critical care for consult. Patient had some clinical improvement after Lasix, however his blood pressure dropped to 80/50.  500 mL bolus of fluid given. Lactic acid is normal.  Adequate peripheral perfusion.  WBC count is normal.  Assessment & Plan:   Principal Problem:   HCAP (healthcare-associated pneumonia) Active Problems:   IPF (idiopathic pulmonary fibrosis) (HCC)   Sepsis ([place])  Bacterial pneumonia, bilateral lower lobe pneumonia in the setting of lung cancer, lymphadenopathy and previous radiation injury.  Acute hypoxic respiratory failure. Antibiotics to treat bacterial pneumonia. started on vancomycin and cefepime given severity of symptoms and cancer treatments.Chest physiotherapy, incentive spirometry, deep breathing exercises, sputum  induction, mucolytic's and bronchodilators. With low-grade temperature today, send blood cultures.  No blood cultures were done in the emergency room. Influenza negative.  Respiratory virus panel drawn.  Supplemental oxygen to keep saturations more than 90%. Patient may need ambulatory oxygen on discharge. Transfer to stepdown unit with pulmonary consultation.  Idiopathic pulmonary fibrosis: Probably aggravating oxygen requirement due to underlying idiopathic pulmonary fibrosis.  Will evaluate for need for oxygen on discharge.  History of DVT on the right IJ: Continue Eliquis.  Patient on low-dose Eliquis.  CTA with no evidence of PE.  GERD: On PPI continue.  Stage III lung cancer and rectal cancer: Followed by oncology.  Further treatment plan as per them.  DVT prophylaxis: Eliquis Code Status: Full code Family Communication: None Disposition Plan: Inpatient hospitalization, step down.   Consultants:   Oncology  Procedures:   None  Antimicrobials:   Vancomycin cefepime, 09/21/2018   Objective: Vitals:   10/10/18 0953 10/10/18 1019 10/10/18 1105 10/10/18 1128  BP: (!) 84/63 (!) 80/55 (!) 87/65 (!) 88/65  Pulse: (!) 117 (!) 118 (!) 112 (!) 112  Resp: 16 20  20   Temp: 98.1 F (36.7 C) 98.3 F (36.8 C) 98.4 F (36.9 C) 98.2 F (36.8 C)  TempSrc: Oral Oral Oral Oral  SpO2: 95% 95% 98% 96%  Weight:      Height:        Intake/Output Summary (Last 24 hours) at 10/10/2018 1200 Last data filed at 10/10/2018 1020 Gross per 24 hour  Intake 1508.81 ml  Output 2600 ml  Net -1091.19 ml   Filed Weights   09/21/2018 1638  Weight: 71.7 kg    Examination:  General exam: Appears anxious, mild respiratory distress, patient is  on 6 L of oxygen.    Respiratory system: Bilateral poor air entry at bases.  Patient has fine crackles all over.   Cardiovascular system: S1 & S2 heard, RRR. No JVD, murmurs, rubs, gallops or clicks. No pedal edema. Gastrointestinal system: Abdomen is  nondistended, soft and nontender. No organomegaly or masses felt. Normal bowel sounds heard. Central nervous system: Alert and oriented. No focal neurological deficits. Extremities: Symmetric 5 x 5 power. Skin: No rashes, lesions or ulcers Psychiatry: Judgement and insight appear normal. Mood & affect anxious.    Data Reviewed: I have personally reviewed following labs and imaging studies  CBC: Recent Labs  Lab 09/12/2018 1420 09/23/2018 1646 10/09/18 0526 10/10/18 1109  WBC 6.7 7.3 3.5* 6.3  NEUTROABS 5.0 5.5  --  5.0  HGB 12.2* 13.1 10.1* 10.4*  HCT 37.3* 41.3 32.1* 32.5*  MCV 98.7 101.2* 102.2* 100.0  PLT 153 137* 102* 409*   Basic Metabolic Panel: Recent Labs  Lab 09/13/2018 1420 09/26/2018 1646 10/09/18 0526 10/10/18 1109  NA 138 135 139 138  K 4.0 5.1 4.0 3.5  CL 103 101 111 105  CO2 24 23 23 23   GLUCOSE 153* 120* 96 107*  BUN 16 17 12 10   CREATININE 1.15 1.21 0.85 1.07  CALCIUM 9.3 9.1 7.9* 8.5*   GFR: Estimated Creatinine Clearance: 59.2 mL/min (by C-G formula based on SCr of 1.07 mg/dL). Liver Function Tests: Recent Labs  Lab 10/01/2018 1420 10/09/18 0526 10/10/18 1109  AST 31 27 31   ALT 15 14 15   ALKPHOS 65 46 54  BILITOT 0.6 0.5 0.8  PROT 7.2 5.8* 6.1*  ALBUMIN 3.0* 2.6* 2.7*   No results for input(s): LIPASE, AMYLASE in the last 168 hours. No results for input(s): AMMONIA in the last 168 hours. Coagulation Profile: No results for input(s): INR, PROTIME in the last 168 hours. Cardiac Enzymes: No results for input(s): CKTOTAL, CKMB, CKMBINDEX, TROPONINI in the last 168 hours. BNP (last 3 results) No results for input(s): PROBNP in the last 8760 hours. HbA1C: No results for input(s): HGBA1C in the last 72 hours. CBG: No results for input(s): GLUCAP in the last 168 hours. Lipid Profile: No results for input(s): CHOL, HDL, LDLCALC, TRIG, CHOLHDL, LDLDIRECT in the last 72 hours. Thyroid Function Tests: No results for input(s): TSH, T4TOTAL, FREET4,  T3FREE, THYROIDAB in the last 72 hours. Anemia Panel: No results for input(s): VITAMINB12, FOLATE, FERRITIN, TIBC, IRON, RETICCTPCT in the last 72 hours. Sepsis Labs: Recent Labs  Lab 10/10/18 1007 10/10/18 1109  PROCALCITON <0.10  --   LATICACIDVEN  --  0.9    Recent Results (from the past 240 hour(s))  SARS Coronavirus 2 [facility] order, Performed in [facility] hospital lab) Nasopharyngeal Nasopharyngeal Swab     Status: None   Collection Time: 09/23/2018  4:49 PM   Specimen: Nasopharyngeal Swab  Result Value Ref Range Status   SARS Coronavirus 2 NEGATIVE NEGATIVE Final    Comment: (NOTE) If result is NEGATIVE SARS-CoV-2 target nucleic acids are NOT DETECTED. The SARS-CoV-2 RNA is generally detectable in upper and lower  respiratory specimens during the acute phase of infection. The lowest  concentration of SARS-CoV-2 viral copies this assay can detect is 250  copies / mL. A negative result does not preclude SARS-CoV-2 infection  and should not be used as the sole basis for treatment or other  patient management decisions.  A negative result may occur with  improper specimen collection / handling, submission of specimen other  than  nasopharyngeal swab, presence of viral mutation(s) within the  areas targeted by this assay, and inadequate number of viral copies  (<250 copies / mL). A negative result must be combined with clinical  observations, patient history, and epidemiological information. If result is POSITIVE SARS-CoV-2 target nucleic acids are DETECTED. The SARS-CoV-2 RNA is generally detectable in upper and lower  respiratory specimens dur ing the acute phase of infection.  Positive  results are indicative of active infection with SARS-CoV-2.  Clinical  correlation with patient history and other diagnostic information is  necessary to determine patient infection status.  Positive results do  not rule out bacterial infection or co-infection with other viruses. If  result is PRESUMPTIVE POSTIVE SARS-CoV-2 nucleic acids MAY BE PRESENT.   A presumptive positive result was obtained on the submitted specimen  and confirmed on repeat testing.  While 2019 novel coronavirus  (SARS-CoV-2) nucleic acids may be present in the submitted sample  additional confirmatory testing may be necessary for epidemiological  and / or clinical management purposes  to differentiate between  SARS-CoV-2 and other Sarbecovirus currently known to infect humans.  If clinically indicated additional testing with an alternate test  methodology [phone]) is advised. The SARS-CoV-2 RNA is generally  detectable in upper and lower respiratory sp ecimens during the acute  phase of infection. The expected result is Negative. Fact Sheet for Patients:  StrictlyIdeas.no Fact Sheet for Healthcare Providers: BankingDealers.co.za This test is not yet approved or cleared by the [place] FDA and has been authorized for detection and/or diagnosis of SARS-CoV-2 by FDA under an Emergency Use Authorization (EUA).  This EUA will remain in effect (meaning this test can be used) for the duration of the COVID-19 declaration under Section 564(b)(1) of the Act, 21 U.S.C. section 360bbb-3(b)(1), unless the authorization is terminated or revoked sooner. Performed at [facility], [place] [address] [place], [place] [address]   MRSA PCR Screening     Status: None   Collection Time: 10/09/18  4:22 PM   Specimen: Nasal Mucosa; Nasopharyngeal  Result Value Ref Range Status   MRSA by PCR NEGATIVE NEGATIVE Final    Comment:        The GeneXpert MRSA Assay (FDA approved for NASAL specimens only), is one component of a comprehensive MRSA colonization surveillance program. It is not intended to diagnose MRSA infection nor to guide or monitor treatment for MRSA infections. Performed at [facility], [place] [address] [place], [place] [address]          Radiology Studies: Dg Chest 1 View  Result Date: 10/10/2018 CLINICAL DATA:  Shortness of breath EXAM: CHEST  1 VIEW COMPARISON:  10/06/2018 FINDINGS: Cardiac shadow is stable. Right-sided chest wall port is again seen. Increase in interstitial changes are noted bilaterally superimposed over more chronic changes consistent with pulmonary edema. No focal confluent infiltrate is seen. No bony abnormality is noted. IMPRESSION: Increase in interstitial change consistent with pulmonary edema. Electronically Signed   By: Inez Catalina M.D.   On: 10/10/2018 09:09   Dg Chest 2 View  Result Date: 09/28/2018 CLINICAL DATA:  History of rectal and lung carcinoma with cough and dyspnea EXAM: CHEST - 2 VIEW COMPARISON:  07/23/2018 FINDINGS: Right chest wall port is again seen. Cardiac shadow is stable. The overall inspiratory effort is poor. Chronic fibrotic changes are noted within the lungs bilaterally. These are worse in the left lower lobe than on the right. Mild atelectasis is noted in the right lung  base new from the prior exam. No sizable effusion is seen. No acute bony abnormality is noted. IMPRESSION: Right basilar atelectasis superimposed over chronic fibrotic and post radiation changes. Electronically Signed   By: Inez Catalina M.D.   On: 09/30/2018 16:02   Ct Angio Chest Pe W And/or Wo Contrast  Result Date: 10/07/2018 CLINICAL DATA:  Dyspnea.  History of lung cancer. EXAM: CT ANGIOGRAPHY CHEST WITH CONTRAST TECHNIQUE: Multidetector CT imaging of the chest was performed using the standard protocol during bolus administration of intravenous contrast. Multiplanar CT image reconstructions and MIPs were obtained to evaluate the vascular anatomy. CONTRAST:  149mL OMNIPAQUE IOHEXOL 350 MG/ML SOLN COMPARISON:  CT scan of July 23, 2018.  CT scan of April 19, 2018. FINDINGS: Cardiovascular: Satisfactory opacification of the pulmonary arteries to the segmental level. No  evidence of pulmonary embolism. Normal heart size. No pericardial effusion. Mediastinum/Nodes: Moderate size sliding-type hiatal hernia is noted. Thyroid gland is unremarkable. 1.7 cm left hilar lymph node is noted which is slightly smaller compared to prior exam. Lungs/Pleura: No pneumothorax or pleural effusion is noted. Significantly increased lung opacities are noted concerning for worsening edema or possibly inflammation. Upper Abdomen: Cholelithiasis is noted. Musculoskeletal: No chest wall abnormality. No acute or significant osseous findings. Review of the MIP images confirms the above findings. IMPRESSION: No definite evidence of pulmonary embolus. Significantly increased lung opacities are noted bilaterally concerning for worsening inflammation or edema. Moderate size sliding-type hiatal hernia. 1.7 cm left hilar lymph node is noted which is slightly decreased compared to prior exam. Cholelithiasis. Electronically Signed   By: Marijo Conception M.D.   On: 09/19/2018 20:48        Scheduled Meds:  apixaban  2.5 mg Oral Daily   Chlorhexidine Gluconate Cloth  6 each Topical Daily   famotidine  20 mg Oral QHS   furosemide       pantoprazole  40 mg Oral Daily   sucralfate  1 g Oral QID   tamsulosin  0.4 mg Oral QHS   Continuous Infusions:  ceFEPime (MAXIPIME) IV 2 g (10/10/18 0702)   vancomycin 1,500 mg (10/10/18 0247)     LOS: 2 days    Time spent: 35 minutes    Barb Merino, MD [facility] Pager [phone]  If 7PM-7AM, please contact night-coverage [email or web address] Password [email or web address] 10/10/2018, 12:00 PM

## 2018-10-10 NOTE — Progress Notes (Signed)
MD on floor and updated on Pt's condition BP 80/52. Pulse 115. On 10 l high flow O2 Saturations 94-95%. New orders to be placed

## 2018-10-10 NOTE — Significant Event (Signed)
Rapid Response Event Note  Overview: Time Called: 0738 Arrival Time: 0743 Event Type: Respiratory  Notified by Abby 4th floor RN in regards to patient needing rapid response at bedside due to respiratory distress. Upon arrival, patient was alert and oriented. Patient did have elevated respirations. MD Ghimire at bedside.  Initial Focused Assessment: Neuro: Alert and oriented, patient able to follow simple commands. Pupils 1 to 2+, equal bilaterally, reactive to light, brisk. Patient's temp: 100.1 F oral  Cardiac: ST, HR 120s-130s, BP WNL  Respiratory: Breath sounds clear and diminished in upper and lower lung fields, O2 Sats were in mid 80s on 5LNC, Respiratory Therapy changed patient over to HFNC at 10L, patient O2 Sats now 95%. RR 30s-40s. Patient slightly labored but able to maintain at this time. Patient states that "breathing is somewhat better". Interventions: MD Ghimire placed orders for lasix and tyelanol to be given   Plan of Care (if not transferred): Continue to monitor patient on telemetry floor for now. Carry out MD new orders at this time. If patient begins to have a change in clinical status or increase in work of breathing, please call Rapid Response.   Event Summary:   at      at          [place]

## 2018-10-10 DEATH — deceased

## 2018-10-11 DIAGNOSIS — J84112 Idiopathic pulmonary fibrosis: Principal | ICD-10-CM

## 2018-10-11 DIAGNOSIS — J9601 Acute respiratory failure with hypoxia: Secondary | ICD-10-CM

## 2018-10-11 LAB — CBC WITH DIFFERENTIAL/PLATELET
Abs Immature Granulocytes: 0.02 10*3/uL (ref 0.00–0.07)
Basophils Absolute: 0 10*3/uL (ref 0.0–0.1)
Basophils Relative: 0 %
Eosinophils Absolute: 0 10*3/uL (ref 0.0–0.5)
Eosinophils Relative: 0 %
HCT: 31.8 % — ABNORMAL LOW (ref 39.0–52.0)
Hemoglobin: 10.1 g/dL — ABNORMAL LOW (ref 13.0–17.0)
Immature Granulocytes: 0 %
Lymphocytes Relative: 7 %
Lymphs Abs: 0.4 10*3/uL — ABNORMAL LOW (ref 0.7–4.0)
MCH: 31.6 pg (ref 26.0–34.0)
MCHC: 31.8 g/dL (ref 30.0–36.0)
MCV: 99.4 fL (ref 80.0–100.0)
Monocytes Absolute: 0.1 10*3/uL (ref 0.1–1.0)
Monocytes Relative: 2 %
Neutro Abs: 4.4 10*3/uL (ref 1.7–7.7)
Neutrophils Relative %: 91 %
Platelets: 92 10*3/uL — ABNORMAL LOW (ref 150–400)
RBC: 3.2 MIL/uL — ABNORMAL LOW (ref 4.22–5.81)
RDW: 15.9 % — ABNORMAL HIGH (ref 11.5–15.5)
WBC: 4.9 10*3/uL (ref 4.0–10.5)
nRBC: 0 % (ref 0.0–0.2)

## 2018-10-11 LAB — BASIC METABOLIC PANEL
Anion gap: 8 (ref 5–15)
BUN: 17 mg/dL (ref 8–23)
CO2: 24 mmol/L (ref 22–32)
Calcium: 8.9 mg/dL (ref 8.9–10.3)
Chloride: 106 mmol/L (ref 98–111)
Creatinine, Ser: 0.94 mg/dL (ref 0.61–1.24)
GFR calc Af Amer: >60 mL/min (ref >60)
GFR calc non Af Amer: >60 mL/min (ref >60)
Glucose, Bld: 151 mg/dL — ABNORMAL HIGH (ref 70–99)
Potassium: 3.9 mmol/L (ref 3.5–5.1)
Sodium: 138 mmol/L (ref 135–145)

## 2018-10-11 LAB — PHOSPHORUS: Phosphorus: 3.1 mg/dL (ref 2.5–4.6)

## 2018-10-11 LAB — MAGNESIUM: Magnesium: 1.8 mg/dL (ref 1.7–2.4)

## 2018-10-11 LAB — PROCALCITONIN: Procalcitonin: 0.14 ng/mL

## 2018-10-11 MED ORDER — AZITHROMYCIN 250 MG PO TABS
250.0000 mg | ORAL_TABLET | Freq: Every day | ORAL | Status: AC
  Administered 2018-10-11 – 2018-10-15 (×5): 250 mg via ORAL
  Filled 2018-10-11 (×6): qty 1

## 2018-10-11 MED ORDER — SODIUM CHLORIDE 0.9 % IV SOLN: INTRAVENOUS | Status: DC | PRN

## 2018-10-11 MED ORDER — PRO-STAT SUGAR FREE PO LIQD
30.0000 mL | Freq: Two times a day (BID) | ORAL | Status: DC
  Administered 2018-10-11 – 2018-10-12 (×3): 30 mL via ORAL
  Filled 2018-10-11 (×7): qty 30

## 2018-10-11 MED ORDER — ENSURE ENLIVE PO LIQD
237.0000 mL | Freq: Two times a day (BID) | ORAL | Status: DC
  Administered 2018-10-11 – 2018-10-14 (×5): 237 mL via ORAL

## 2018-10-11 MED ORDER — SULFAMETHOXAZOLE-TRIMETHOPRIM 800-160 MG PO TABS
1.0000 | ORAL_TABLET | ORAL | Status: DC
  Administered 2018-10-11: 1 via ORAL
  Filled 2018-10-11: qty 1

## 2018-10-11 NOTE — Consult Note (Signed)
NAME:  Austin Yu, MRN:  [id number], DOB:  February 09, 1945, LOS: 3 ADMISSION DATE:  10/04/2018, CONSULTATION DATE:  10/1 REFERRING MD:  Dr. Sloan Leiter, CHIEF COMPLAINT:  SOB   Brief History   73 year old male admitted 9/29 with shortness of breath.  Hx IPF, not on therapy due to SE's (diarrhea). Good functional status prior to diagnosis of adenocarcinoma of the lung (IIIa) & rectal cancer in early 2020. Developed dyspnea in the 3 weeks leading up to admit.  Hypoxic on admit.  ESR 78, PCT <0.10.   Past Medical History  Lung Cancer - Adenocarcinoma Stage IIIa Lung Cancer, EBUS 02/07/18 per Dr. Lamonte Sakai  Idiopathic Pulmonary Fibrosis - followed by Dr. Melvyn Novas  Rectal Cancer -diagnosed 01/21/2018, biopsy positive for invasive adenocarcinoma BPH  DVT - R IJ, on eliquis  GERD [facility] Events   09/29 Admit  10/01 Transferred to SDU for hypotension post lasix   Consults:  PCCM 10/1  Procedures:     Significant Diagnostic Tests:  CTA Chest 9/29 >> neg for PE, significantly increased lung opacities noted bilaterally concerning for worsening inflammation or edema, moderate sliding-type hiatal hernia, 1.7cm left hilar lymph note, decreased from prior exam  Micro Data:  COVID 9/29 >> negative  BCx2 10/1 >>  RVP 10/1 >> negative  Antimicrobials:  Azithro 9/29 >> 9/30  Vanco 9/29 >> 10/1 Cefepime 9/30 >> 10/1  Interim history/subjective:  Pt reports feeling better.  States he rested well. RN notes episodes of desaturation with cough, exertion into the low 80's with slow recovery.   Objective   Blood pressure 107/68, pulse (!) 102, temperature (!) 97.2 F (36.2 C), temperature source Oral, resp. rate (!) 36, height '5\' 7"'  (1.702 m), weight 71.7 kg, SpO2 (!) 86 %.        Intake/Output Summary (Last 24 hours) at 10/11/2018 3143 Last data filed at 10/10/2018 2000 Gross per 24 hour  Intake 249.93 ml  Output 850 ml  Net -600.07 ml   Filed Weights   09/10/2018 1638  Weight: 71.7  kg    Examination: General: pleasant elderly male lying in bed in NAD HEENT: MM pink/moist, HFNC O2  Neuro: AAOx4, speech clear, MAE  CV: s1s2 rrr, no m/r/g PULM:  Tachypnea but non labored, lungs bilaterally with dry bibasilar crackles R>L GI: soft, bsx4 active  Extremities: warm/dry, no edema  Skin: no rashes or lesions  Resolved Hospital Problem list   Hypotension - in setting of lasix   Assessment & Plan:   Acute Hypoxemic Respiratory Failure  -patient is a never smoker with hx of IIIa adenocarcinoma of the lung, recently completed chemotherapy and XRT 09/18/18.  He has underlying ILD at baseline but little symptom burden from it before chemo/XRT. Developed gradual shortness of breath in the last 3 weeks.  Radiographic review shows worsening of background ground glass / opacities bilaterally since May 2020.  ESR is elevated and PCT negative.  Suspect infiltrates may be inflammatory in nature.  He has acid reflux and sliding hiatal hernia which puts him at risk for recurrent aspiration.    ILD -not on O2 at baseline  - not on anti-fibrotics due to intolerance   At Risk Aspiration -possible aspiration event with water 10/1 in setting of dyspnea  P: Continue steroids 122m IV Q6 - started 10/1 Add PJP prophylaxis with 3x weekly bactrim  Wean O2 for sats >88%.  Anticipate desaturation with exertion and slow recovery  Pulmonary hygiene -IS, mobilize  Aspiration precautions  Maximum GERD therapy  LCB noted  Best practice:  Diet: Cardiac diet  Pain/Anxiety/Delirium protocol (if indicated): n/a  VAP protocol (if indicated): n/a  DVT prophylaxis: apixaban  GI prophylaxis: Pepcid, protonix  Glucose control: n/a  Mobility: as tolerated  Code Status: Limited Code - no CPR, no intubation, no ACLS, no defib.  BiPAP, pressors only Family Communication: Patient updated on plan of care Disposition: SDU  Labs   CBC: Recent Labs  Lab 10/06/2018 1420 10/07/2018 1646 10/09/18 0526  10/10/18 1109 10/11/18 0500  WBC 6.7 7.3 3.5* 6.3 4.9  NEUTROABS 5.0 5.5  --  5.0 4.4  HGB 12.2* 13.1 10.1* 10.4* 10.1*  HCT 37.3* 41.3 32.1* 32.5* 31.8*  MCV 98.7 101.2* 102.2* 100.0 99.4  PLT 153 137* 102* 102* 92*    Basic Metabolic Panel: Recent Labs  Lab 09/14/2018 1420 09/11/2018 1646 10/09/18 0526 10/10/18 1109 10/11/18 0500  NA 138 135 139 138 138  K 4.0 5.1 4.0 3.5 3.9  CL 103 101 111 105 106  CO2 '24 23 23 23 24  ' GLUCOSE 153* 120* 96 107* 151*  BUN '16 17 12 10 17  ' CREATININE 1.15 1.21 0.85 1.07 0.94  CALCIUM 9.3 9.1 7.9* 8.5* 8.9  MG  --   --   --   --  1.8  PHOS  --   --   --   --  3.1   GFR: Estimated Creatinine Clearance: 67.4 mL/min (by C-G formula based on SCr of 0.94 mg/dL). Recent Labs  Lab 09/29/2018 1646 10/09/18 0526 10/10/18 1007 10/10/18 1109 10/11/18 0500  PROCALCITON  --   --  <0.10  --  0.14  WBC 7.3 3.5*  --  6.3 4.9  LATICACIDVEN  --   --   --  0.9  --     Liver Function Tests: Recent Labs  Lab 09/25/2018 1420 10/09/18 0526 10/10/18 1109  AST '31 27 31  ' ALT '15 14 15  ' ALKPHOS 65 46 54  BILITOT 0.6 0.5 0.8  PROT 7.2 5.8* 6.1*  ALBUMIN 3.0* 2.6* 2.7*   No results for input(s): LIPASE, AMYLASE in the last 168 hours. No results for input(s): AMMONIA in the last 168 hours.  ABG No results found for: PHART, PCO2ART, PO2ART, HCO3, TCO2, ACIDBASEDEF, O2SAT   Coagulation Profile: No results for input(s): INR, PROTIME in the last 168 hours.  Cardiac Enzymes: No results for input(s): CKTOTAL, CKMB, CKMBINDEX, TROPONINI in the last 168 hours.  HbA1C: No results found for: HGBA1C  CBG: No results for input(s): GLUCAP in the last 168 hours.  Critical care time: n/a    Noe Gens, NP-C [facility] Pgr: [phone] or if no answer [phone] 10/11/2018, 9:39 AM

## 2018-10-11 NOTE — Progress Notes (Signed)
[place] for Apixaban Indication: DVT  No Known Allergies  Patient Measurements: Height: 5\' 7"  (170.2 cm) Weight: 158 lb (71.7 kg) IBW/kg (Calculated) : 66.1  Vital Signs: Temp: 97.3 F (36.3 C) (10/02 0800) Temp Source: Oral (10/02 0300) BP: 98/56 (10/02 1200) Pulse Rate: 116 (10/02 1200)  Labs: Recent Labs    09/14/2018 1646 10/04/2018 2100 10/09/18 0526 10/10/18 1007 10/10/18 1109 10/11/18 0500  HGB 13.1  --  10.1*  --  10.4* 10.1*  HCT 41.3  --  32.1*  --  32.5* 31.8*  PLT 137*  --  102*  --  102* 92*  CREATININE 1.21  --  0.85  --  1.07 0.94  TROPONINIHS 9 9  --  14  --   --    Estimated Creatinine Clearance: 67.4 mL/min (by C-G formula based on SCr of 0.94 mg/dL).  Medical History: Past Medical History:  Diagnosis Date  . Abnormal chest sounds   . BPH (benign prostatic hypertrophy)   . Cancer [facility])    rectum cancer; dx 01/21/2018 by Dr. Loletha Carrow by colonoscopy  . Complication of anesthesia    ISSUE W/ EXTUBATION 05-18-2015 IN [facility] -- REFER TO ANESTHESIA NOTE''S;    . DVT (deep venous thrombosis) (HCC)    right jugular vein. now on eliquis   . Dyspnea    ongoing d/t hx of pulmonary fibrosis   . Elevated PSA   . GERD (gastroesophageal reflux disease)   . Hematuria    ONGOING   . History of airway aspiration   . History of kidney stones   . Hyperlipidemia   . Hyperplasia of prostate without lower urinary tract symptoms (LUTS)   . Left ureteral stone   . Lung nodule   . Nephrolithiasis   . Postinflammatory pulmonary fibrosis (HCC)    PULMOLOGIST-  DR HUDJ-  IDIOPATHIC   . Pulmonary fibrosis ([place])   . Rectal cancer ([place])   . Sinus tachycardia by electrocardiogram    states he was told this was caused by chemo drugs; now compelted chemo, reports no acute cardiac symtoms,   . Strawberry hemangioma of skin   . Upper airway cough syndrome    PULMOLOGIST-  DR SHFW   Medications:  Scheduled:  . apixaban  2.5 mg Oral  Daily  . Chlorhexidine Gluconate Cloth  6 each Topical Daily  . famotidine  20 mg Oral QHS  . feeding supplement  1 Container Oral Q24H  . feeding supplement (ENSURE ENLIVE)  237 mL Oral BID BM  . feeding supplement (PRO-STAT SUGAR FREE 64)  30 mL Oral BID  . mouth rinse  15 mL Mouth Rinse BID  . methylPREDNISolone (SOLU-MEDROL) injection  125 mg Intravenous Q6H  . multivitamin with minerals  1 tablet Oral Daily  . pantoprazole  40 mg Oral BID  . sucralfate  1 g Oral QID  . sulfamethoxazole-trimethoprim  1 tablet Oral 3 times weekly  . tamsulosin  0.4 mg Oral QHS   Assessment: 73 y.o. male admitted on 10/07/2018 with pneumonia.  Pharmacy requested to dose Apixaban, home dose 2.5mg  daily - as on PTA per oncology note on 08/30/18 for thrombocytopenia  10/2: Plt 137 on admit >> 92  Goal of Therapy:  Monitor platelets by anticoagulation protocol: Yes   Plan:  Apixaban 2.5mg  daily Monitor CBC  Austin Yu L 10/11/2018,12:49 PM

## 2018-10-11 NOTE — Progress Notes (Signed)
Pt was awake and alert lying in bed when I arrived. He said he was feeling better and looking forward to getting better. Pt talked about his faith and immediately shared with me a devotional that he is reading. He was very pleasant and appreciated the prayer and visit. Please page if additional support is needed.  [place], [place]   10/11/18 2100  Clinical Encounter Type  Visited With Patient

## 2018-10-11 NOTE — Progress Notes (Signed)
PROGRESS NOTE    Austin Yu  JIR:[id number] DOB: 04-06-1945 DOA: 09/23/2018 PCP: Deland Pretty, MD    Brief Narrative:  73 year old male with history of hyperlipidemia, BPH, GERD, rectal cancer diagnosed on 01/2018, right jugular vein DVT, pulmonary fibrosis not on home oxygen, lung cancer on chemotherapy and radiation therapy presented to the emergency room from cancer center with nonproductive cough and progressive shortness of breath.  In the emergency room, he was on 3 L oxygen, CTA of the lungs showed bilateral lower lobe opacities concerning for worsening inflammation or edema.  He has perihilar lymphadenopathy.  09/10/2018: Patient started requiring high levels of oxygen.  Transfer to stepdown unit.  Subjective: Patient is still requires high flow oxygen in the stepdown unit.  Patient is okay at complete rest.  Minimal activity even talking he feels out of breath and desats to 80s.  Afebrile overnight. Detailed discussion with patient, family and critical care.  Assessment & Plan:   Principal Problem:   HCAP (healthcare-associated pneumonia) Active Problems:   IPF (idiopathic pulmonary fibrosis) (HCC)   Sepsis ([place])   Acute respiratory failure with hypoxia (HCC)  Acute hypoxic respiratory failure in the setting of interstitial pulmonary fibrosis, lung cancer with radiation treatment: Initially treated with broad-spectrum antibiotics, WBC count normal.  Procalcitonin normal.  Patient with increasing oxygen requirement. Now treated as IPF exacerbation.  Seen and followed by pulmonology.  Currently remains on high-dose IV steroids. chest physiotherapy, incentive spirometry, deep breathing exercises, sputum induction, mucolytic's and bronchodilators. Influenza negative.  Respiratory virus panel pending.  Blood cultures pending. Supplemental oxygen to keep saturations more than 90%.  History of DVT on the right IJ: Continue Eliquis.  Patient on low-dose Eliquis.  CTA with no  evidence of PE.  GERD: On PPI continue.  Stage III lung cancer and rectal cancer: Followed by oncology.  Further treatment plan as per them once patient recovers from pulmonary standpoint.  Patient transferred to stepdown unit.  Poor prognosis explained to patient and family by pulmonologist.  Currently remains DNR. Will have prolonged recovery.  DVT prophylaxis: Eliquis Code Status: Partial code.  DNR. Family Communication: None today. Disposition Plan: Inpatient hospitalization, step down.   Consultants:   Oncology  Procedures:   None  Antimicrobials:   Vancomycin cefepime, 09/24/2018--10/10/2018   Objective: Vitals:   10/11/18 0200 10/11/18 0300 10/11/18 0600 10/11/18 0700  BP: 110/72 102/65 102/65 107/68  Pulse: (!) 102 100 94 (!) 102  Resp: (!) 34 (!) 27 (!) 26 (!) 36  Temp:  (!) 97.2 F (36.2 C)    TempSrc:  Oral    SpO2: 92% 93% 94% (!) 86%  Weight:      Height:        Intake/Output Summary (Last 24 hours) at 10/11/2018 1100 Last data filed at 10/10/2018 2000 Gross per 24 hour  Intake 249.93 ml  Output 550 ml  Net -300.07 ml   Filed Weights   09/10/2018 1638  Weight: 71.7 kg    Examination:  General exam: Appears anxious, moderate respiratory distress.  Patient intermittently on high flow oxygen.    Respiratory system: Bilateral poor air entry at bases.  Patient has fine crackles all over.   Cardiovascular system: S1 & S2 heard, RRR. No JVD, murmurs, rubs, gallops or clicks. No pedal edema. Gastrointestinal system: Abdomen is nondistended, soft and nontender. No organomegaly or masses felt. Normal bowel sounds heard. Central nervous system: Alert and oriented. No focal neurological deficits. Extremities: Symmetric 5 x 5 power. Skin: No  rashes, lesions or ulcers Psychiatry: Judgement and insight appear normal. Mood & affect anxious.    Data Reviewed: I have personally reviewed following labs and imaging studies  CBC: Recent Labs  Lab  09/22/2018 1420 10/05/2018 1646 10/09/18 0526 10/10/18 1109 10/11/18 0500  WBC 6.7 7.3 3.5* 6.3 4.9  NEUTROABS 5.0 5.5  --  5.0 4.4  HGB 12.2* 13.1 10.1* 10.4* 10.1*  HCT 37.3* 41.3 32.1* 32.5* 31.8*  MCV 98.7 101.2* 102.2* 100.0 99.4  PLT 153 137* 102* 102* 92*   Basic Metabolic Panel: Recent Labs  Lab 09/24/2018 1420 10/01/2018 1646 10/09/18 0526 10/10/18 1109 10/11/18 0500  NA 138 135 139 138 138  K 4.0 5.1 4.0 3.5 3.9  CL 103 101 111 105 106  CO2 24 23 23 23 24   GLUCOSE 153* 120* 96 107* 151*  BUN 16 17 12 10 17   CREATININE 1.15 1.21 0.85 1.07 0.94  CALCIUM 9.3 9.1 7.9* 8.5* 8.9  MG  --   --   --   --  1.8  PHOS  --   --   --   --  3.1   GFR: Estimated Creatinine Clearance: 67.4 mL/min (by C-G formula based on SCr of 0.94 mg/dL). Liver Function Tests: Recent Labs  Lab 10/02/2018 1420 10/09/18 0526 10/10/18 1109  AST 31 27 31   ALT 15 14 15   ALKPHOS 65 46 54  BILITOT 0.6 0.5 0.8  PROT 7.2 5.8* 6.1*  ALBUMIN 3.0* 2.6* 2.7*   No results for input(s): LIPASE, AMYLASE in the last 168 hours. No results for input(s): AMMONIA in the last 168 hours. Coagulation Profile: No results for input(s): INR, PROTIME in the last 168 hours. Cardiac Enzymes: No results for input(s): CKTOTAL, CKMB, CKMBINDEX, TROPONINI in the last 168 hours. BNP (last 3 results) No results for input(s): PROBNP in the last 8760 hours. HbA1C: No results for input(s): HGBA1C in the last 72 hours. CBG: No results for input(s): GLUCAP in the last 168 hours. Lipid Profile: No results for input(s): CHOL, HDL, LDLCALC, TRIG, CHOLHDL, LDLDIRECT in the last 72 hours. Thyroid Function Tests: No results for input(s): TSH, T4TOTAL, FREET4, T3FREE, THYROIDAB in the last 72 hours. Anemia Panel: No results for input(s): VITAMINB12, FOLATE, FERRITIN, TIBC, IRON, RETICCTPCT in the last 72 hours. Sepsis Labs: Recent Labs  Lab 10/10/18 1007 10/10/18 1109 10/11/18 0500  PROCALCITON <0.10  --  0.14  LATICACIDVEN   --  0.9  --     Recent Results (from the past 240 hour(s))  SARS Coronavirus 2 [facility] order, Performed in [facility] hospital lab) Nasopharyngeal Nasopharyngeal Swab     Status: None   Collection Time: 09/12/2018  4:49 PM   Specimen: Nasopharyngeal Swab  Result Value Ref Range Status   SARS Coronavirus 2 NEGATIVE NEGATIVE Final    Comment: (NOTE) If result is NEGATIVE SARS-CoV-2 target nucleic acids are NOT DETECTED. The SARS-CoV-2 RNA is generally detectable in upper and lower  respiratory specimens during the acute phase of infection. The lowest  concentration of SARS-CoV-2 viral copies this assay can detect is 250  copies / mL. A negative result does not preclude SARS-CoV-2 infection  and should not be used as the sole basis for treatment or other  patient management decisions.  A negative result may occur with  improper specimen collection / handling, submission of specimen other  than nasopharyngeal swab, presence of viral mutation(s) within the  areas targeted by this assay, and inadequate number of viral copies  (<250 copies /  mL). A negative result must be combined with clinical  observations, patient history, and epidemiological information. If result is POSITIVE SARS-CoV-2 target nucleic acids are DETECTED. The SARS-CoV-2 RNA is generally detectable in upper and lower  respiratory specimens dur ing the acute phase of infection.  Positive  results are indicative of active infection with SARS-CoV-2.  Clinical  correlation with patient history and other diagnostic information is  necessary to determine patient infection status.  Positive results do  not rule out bacterial infection or co-infection with other viruses. If result is PRESUMPTIVE POSTIVE SARS-CoV-2 nucleic acids MAY BE PRESENT.   A presumptive positive result was obtained on the submitted specimen  and confirmed on repeat testing.  While 2019 novel coronavirus  (SARS-CoV-2) nucleic acids may be present in the  submitted sample  additional confirmatory testing may be necessary for epidemiological  and / or clinical management purposes  to differentiate between  SARS-CoV-2 and other Sarbecovirus currently known to infect humans.  If clinically indicated additional testing with an alternate test  methodology [phone]) is advised. The SARS-CoV-2 RNA is generally  detectable in upper and lower respiratory sp ecimens during the acute  phase of infection. The expected result is Negative. Fact Sheet for Patients:  StrictlyIdeas.no Fact Sheet for Healthcare Providers: BankingDealers.co.za This test is not yet approved or cleared by the [place] FDA and has been authorized for detection and/or diagnosis of SARS-CoV-2 by FDA under an Emergency Use Authorization (EUA).  This EUA will remain in effect (meaning this test can be used) for the duration of the COVID-19 declaration under Section 564(b)(1) of the Act, 21 U.S.C. section 360bbb-3(b)(1), unless the authorization is terminated or revoked sooner. Performed at [facility], [place] [address] [place], [place] [address]   MRSA PCR Screening     Status: None   Collection Time: 10/09/18  4:22 PM   Specimen: Nasal Mucosa; Nasopharyngeal  Result Value Ref Range Status   MRSA by PCR NEGATIVE NEGATIVE Final    Comment:        The GeneXpert MRSA Assay (FDA approved for NASAL specimens only), is one component of a comprehensive MRSA colonization surveillance program. It is not intended to diagnose MRSA infection nor to guide or monitor treatment for MRSA infections. Performed at [facility], [place] [address], [place], [place] [address]   Culture, blood (routine x 2)     Status: None (Preliminary result)   Collection Time: 10/10/18  8:06 AM   Specimen: BLOOD LEFT HAND  Result Value Ref Range Status   Specimen Description   Final    BLOOD LEFT HAND Performed at  [place] [address] [place], [place] [address]    Special Requests   Final    BOTTLES DRAWN AEROBIC AND ANAEROBIC Blood Culture adequate volume Performed at [place] [address], [place], [place] [address]    Culture   Final    NO GROWTH < 24 HOURS Performed at [place] [address] [place], [place] [address]    Report Status PENDING  Incomplete  Culture, blood (routine x 2)     Status: None (Preliminary result)   Collection Time: 10/10/18  8:06 AM   Specimen: BLOOD  Result Value Ref Range Status   Specimen Description   Final    BLOOD RIGHT ARM Performed at [place] [address], [place],  [address]    Special Requests   Final  BOTTLES DRAWN AEROBIC ONLY Blood Culture adequate volume Performed at [facility] [address], [place], [place] [address]    Culture   Final    NO GROWTH < 24 HOURS Performed at [place] [address], [place], [place] [address]    Report Status PENDING  Incomplete  Respiratory Panel by PCR     Status: None   Collection Time: 10/10/18 11:32 AM   Specimen: Nasopharyngeal Swab; Respiratory  Result Value Ref Range Status   Adenovirus NOT DETECTED NOT DETECTED Final   Coronavirus 229E NOT DETECTED NOT DETECTED Final    Comment: (NOTE) The Coronavirus on the Respiratory Panel, DOES NOT test for the novel  Coronavirus (2019 nCoV)    Coronavirus HKU1 NOT DETECTED NOT DETECTED Final   Coronavirus NL63 NOT DETECTED NOT DETECTED Final   Coronavirus OC43 NOT DETECTED NOT DETECTED Final   Metapneumovirus NOT DETECTED NOT DETECTED Final   Rhinovirus / Enterovirus NOT DETECTED NOT DETECTED Final   Influenza A NOT DETECTED NOT DETECTED Final   Influenza B NOT DETECTED NOT DETECTED Final   Parainfluenza Virus 1 NOT DETECTED NOT DETECTED Final   Parainfluenza Virus 2 NOT DETECTED NOT DETECTED Final   Parainfluenza Virus 3  NOT DETECTED NOT DETECTED Final   Parainfluenza Virus 4 NOT DETECTED NOT DETECTED Final   Respiratory Syncytial Virus NOT DETECTED NOT DETECTED Final   Bordetella pertussis NOT DETECTED NOT DETECTED Final   Chlamydophila pneumoniae NOT DETECTED NOT DETECTED Final   Mycoplasma pneumoniae NOT DETECTED NOT DETECTED Final    Comment: Performed at [facility], [place]. [address] [place], [place] [address]         Radiology Studies: Dg Chest 1 View  Result Date: 10/10/2018 CLINICAL DATA:  Shortness of breath EXAM: CHEST  1 VIEW COMPARISON:  09/16/2018 FINDINGS: Cardiac shadow is stable. Right-sided chest wall port is again seen. Increase in interstitial changes are noted bilaterally superimposed over more chronic changes consistent with pulmonary edema. No focal confluent infiltrate is seen. No bony abnormality is noted. IMPRESSION: Increase in interstitial change consistent with pulmonary edema. Electronically Signed   By: Inez Catalina M.D.   On: 10/10/2018 09:09        Scheduled Meds:  apixaban  2.5 mg Oral Daily   Chlorhexidine Gluconate Cloth  6 each Topical Daily   famotidine  20 mg Oral QHS   feeding supplement  1 Container Oral Q24H   feeding supplement (ENSURE ENLIVE)  237 mL Oral BID BM   feeding supplement (PRO-STAT SUGAR FREE 64)  30 mL Oral BID   mouth rinse  15 mL Mouth Rinse BID   methylPREDNISolone (SOLU-MEDROL) injection  125 mg Intravenous Q6H   multivitamin with minerals  1 tablet Oral Daily   pantoprazole  40 mg Oral BID   sucralfate  1 g Oral QID   sulfamethoxazole-trimethoprim  1 tablet Oral 3 times weekly   tamsulosin  0.4 mg Oral QHS   Continuous Infusions:  sodium chloride       LOS: 3 days    Time spent: 25 minutes    Barb Merino, MD [facility] Pager [phone]  If 7PM-7AM, please contact night-coverage [email or web address] Password [email or web address] 10/11/2018, 11:00 AM

## 2018-10-12 DIAGNOSIS — J84112 Idiopathic pulmonary fibrosis: Principal | ICD-10-CM

## 2018-10-12 DIAGNOSIS — J9601 Acute respiratory failure with hypoxia: Secondary | ICD-10-CM

## 2018-10-12 LAB — CBC WITH DIFFERENTIAL/PLATELET
Abs Immature Granulocytes: 0.07 10*3/uL (ref 0.00–0.07)
Basophils Absolute: 0 10*3/uL (ref 0.0–0.1)
Basophils Relative: 0 %
Eosinophils Absolute: 0 10*3/uL (ref 0.0–0.5)
Eosinophils Relative: 0 %
HCT: 30.8 % — ABNORMAL LOW (ref 39.0–52.0)
Hemoglobin: 10 g/dL — ABNORMAL LOW (ref 13.0–17.0)
Immature Granulocytes: 1 %
Lymphocytes Relative: 3 %
Lymphs Abs: 0.3 10*3/uL — ABNORMAL LOW (ref 0.7–4.0)
MCH: 32.6 pg (ref 26.0–34.0)
MCHC: 32.5 g/dL (ref 30.0–36.0)
MCV: 100.3 fL — ABNORMAL HIGH (ref 80.0–100.0)
Monocytes Absolute: 0.4 10*3/uL (ref 0.1–1.0)
Monocytes Relative: 4 %
Neutro Abs: 9.1 10*3/uL — ABNORMAL HIGH (ref 1.7–7.7)
Neutrophils Relative %: 92 %
Platelets: 99 10*3/uL — ABNORMAL LOW (ref 150–400)
RBC: 3.07 MIL/uL — ABNORMAL LOW (ref 4.22–5.81)
RDW: 16.1 % — ABNORMAL HIGH (ref 11.5–15.5)
WBC: 9.9 10*3/uL (ref 4.0–10.5)
nRBC: 0 % (ref 0.0–0.2)

## 2018-10-12 LAB — BASIC METABOLIC PANEL
Anion gap: 7 (ref 5–15)
BUN: 26 mg/dL — ABNORMAL HIGH (ref 8–23)
CO2: 24 mmol/L (ref 22–32)
Calcium: 8.6 mg/dL — ABNORMAL LOW (ref 8.9–10.3)
Chloride: 106 mmol/L (ref 98–111)
Creatinine, Ser: 0.92 mg/dL (ref 0.61–1.24)
GFR calc Af Amer: >60 mL/min (ref >60)
GFR calc non Af Amer: >60 mL/min (ref >60)
Glucose, Bld: 143 mg/dL — ABNORMAL HIGH (ref 70–99)
Potassium: 4 mmol/L (ref 3.5–5.1)
Sodium: 137 mmol/L (ref 135–145)

## 2018-10-12 LAB — MAGNESIUM: Magnesium: 1.9 mg/dL (ref 1.7–2.4)

## 2018-10-12 LAB — PROCALCITONIN: Procalcitonin: 0.12 ng/mL

## 2018-10-12 LAB — PHOSPHORUS: Phosphorus: 2.3 mg/dL — ABNORMAL LOW (ref 2.5–4.6)

## 2018-10-12 MED ORDER — MORPHINE SULFATE (PF) 2 MG/ML IV SOLN
0.5000 mg | Freq: Four times a day (QID) | INTRAVENOUS | Status: DC | PRN
  Administered 2018-10-12 (×2): 0.5 mg via INTRAVENOUS
  Administered 2018-10-13: 1 mg via INTRAVENOUS
  Filled 2018-10-12 (×4): qty 1

## 2018-10-12 NOTE — Progress Notes (Signed)
Pt was lying in bed alert and awake when I arrived. He was using his instrument that he said he uses that helps him breathe when he is coughing. He appreciative of the brief visit. Please page if additional assistance is needed. [place], MDiv   10/12/18 1700  Clinical Encounter Type  Visited With Patient

## 2018-10-12 NOTE — Progress Notes (Signed)
NAME:  Austin Yu, MRN:  [id number], DOB:  1945-05-31, LOS: 4 ADMISSION DATE:  10/04/2018, CONSULTATION DATE:  10/1 REFERRING MD:  Dr. Sloan Leiter, CHIEF COMPLAINT:  SOB   Brief History   73 year old male admitted 9/29 with shortness of breath.  Hx IPF, not on therapy due to SE's (diarrhea). Good functional status prior to diagnosis of adenocarcinoma of the lung (IIIa) & rectal cancer in early 2020. Developed dyspnea in the 3 weeks leading up to admit.  Hypoxic on admit.  ESR 78, PCT <0.10.   Past Medical History  Lung Cancer - Adenocarcinoma Stage IIIa Lung Cancer, EBUS 02/07/18 per Dr. Lamonte Sakai  Idiopathic Pulmonary Fibrosis - followed by Dr. Melvyn Novas  Rectal Cancer -diagnosed 01/21/2018, biopsy positive for invasive adenocarcinoma BPH  DVT - R IJ, on eliquis  GERD [facility] Events   09/29 Admit  10/01 Transferred to SDU for hypotension post lasix   Consults:  PCCM 10/1  Procedures:     Significant Diagnostic Tests:  CTA Chest 9/29 >> neg for PE, significantly increased lung opacities noted bilaterally concerning for worsening inflammation or edema, moderate sliding-type hiatal hernia, 1.7cm left hilar lymph note, decreased from prior exam  Micro Data:  COVID 9/29 >> negative  BCx2 10/1 >> ng RVP 10/1 >> negative  Antimicrobials:  Azithro 9/29 >> 9/30  Vanco 9/29 >> 10/1 Cefepime 9/30 >> 10/1  Interim history/subjective:   Complains of dyspnea Afebrile Desaturations with minimal movement or coughing   Objective   Blood pressure 113/72, pulse (!) 102, temperature 97.8 F (36.6 C), resp. rate (!) 37, height _0  (1.702 m), weight 71.9 kg, SpO2 (!) 88 %.    FiO2 (%):  [70 %] 70 %   Intake/Output Summary (Last 24 hours) at 10/12/2018 1146 Last data filed at 10/12/2018 1100 Gross per 24 hour  Intake 727 ml  Output 1125 ml  Net -398 ml   Filed Weights   10/07/2018 1638 10/12/18 0353  Weight: 71.7 kg 71.9 kg    Examination: General: pleasant elderly  male lying in bed in NAD HEENT: MM pink/moist, HFNC O2  Neuro: AAOx4, speech clear, MAE  CV: s1s2 rrr, no m/r/g PULM: Tachypneic, accessory muscles being used lungs bilaterally with dry bibasilar crackles R>L GI: soft, bsx4 active  Extremities: warm/dry, no edema  Skin: no rashes or lesions  Chest x-ray from 10/1 personally reviewed which shows increased bilateral interstitial changes  Resolved Hospital Problem list   Hypotension - in setting of lasix   Assessment & Plan:   Acute Hypoxemic Respiratory Failure  -patient is a never smoker with hx of IIIa adenocarcinoma of the lung, recently completed chemotherapy and XRT 09/18/18.  He has underlying ILD at baseline but little symptom burden from it before chemo/XRT. Developed gradual shortness of breath in the last 3 weeks.  Radiographic review shows worsening of background ground glass / opacities bilaterally since May 2020.  ESR is elevated and PCT negative.  Suspect infiltrates related to ILD flare, appears bilateral so unlikely radiation pneumonitis but possible that radiation has triggered  he has acid reflux and sliding hiatal hernia which puts him at risk for recurrent aspiration.    ILD -not on O2 at baseline  - not on anti-fibrotics due to intolerance   At Risk Aspiration -possible aspiration event with water 10/1 in setting of dyspnea  P: Continue steroids 133m IV Q6 - started 10/1 -he does not seem to be responding Ct PJP prophylaxis with 3x weekly bactrim  Wean O2 for sats >88%.  Anticipate desaturation with exertion and slow recovery  Pulmonary hygiene -IS, mobilize  Aspiration precautions  Maximum GERD therapy   Unfortunately if he worsens we may have to add low-dose morphine for comfort   Code Status: Limited Code - no CPR, no intubation, no ACLS, no defib.  BiPAP, pressors only Family Communication: Patient and daughter updated at bedside  Kara Mead MD. FCCP. [facility] Pager [phone]  If no response call [phone]   10/12/2018

## 2018-10-12 NOTE — Progress Notes (Signed)
PROGRESS NOTE    Austin Yu  YQI:[id number] DOB: 03-10-1945 DOA: 10/06/2018 PCP: Deland Pretty, MD    Brief Narrative:  73 year old male with history of hyperlipidemia, BPH, GERD, rectal cancer diagnosed on 01/2018, right jugular vein DVT, pulmonary fibrosis not on home oxygen, lung cancer on chemotherapy and radiation therapy presented to the emergency room from cancer center with nonproductive cough and progressive shortness of breath.  In the emergency room, he was on 3 L oxygen, CTA of the lungs showed bilateral lower lobe opacities concerning for worsening inflammation or edema.  He has perihilar lymphadenopathy.  09/10/2018: Patient started requiring high levels of oxygen.  Transferred to stepdown unit.  Subjective: Patient had no overnight events, still requires high flow oxygen.  Hypoxic on talking and slightest movement.  Denies any nausea or vomiting.  No chest pain.  Afebrile.    Assessment & Plan:   Principal Problem:   HCAP (healthcare-associated pneumonia) Active Problems:   IPF (idiopathic pulmonary fibrosis) (HCC)   Sepsis ([place])   Acute respiratory failure with hypoxia (HCC)  Acute hypoxic respiratory failure in the setting of interstitial pulmonary fibrosis, lung cancer with radiation treatment: Initially treated with broad-spectrum antibiotics, WBC count normal.  Procalcitonin normal.  Patient with increasing oxygen requirement. Now treated as IPF exacerbation.  Seen and followed by pulmonology.  Currently remains on high-dose IV steroids. chest physiotherapy, incentive spirometry, deep breathing exercises, sputum induction, mucolytic's and bronchodilators. Influenza negative.  Respiratory virus panel negative.  Blood cultures negative so far. Supplemental oxygen to keep saturations more than 90%. Poor prognosis.  Followed by pulmonology.  History of DVT on the right IJ: Continue Eliquis.  Patient on low-dose Eliquis.  CTA with no evidence of PE.  GERD: On PPI  continue.  Stage III lung cancer and rectal cancer: Followed by oncology.  Further treatment plan as per them once patient recovers from pulmonary standpoint.  Patient transferred to stepdown unit.  Poor prognosis explained to patient and family by pulmonologist.  Currently remains DNR. Will have prolonged recovery.  DVT prophylaxis: Eliquis Code Status: Partial code.  DNR. Family Communication: Communicated by pulmonology. Disposition Plan: Inpatient hospitalization, step down.   Consultants:   Oncology  Procedures:   None  Antimicrobials:   Vancomycin cefepime, 09/26/2018--10/10/2018   Objective: Vitals:   10/12/18 0400 10/12/18 0500 10/12/18 0600 10/12/18 0800  BP: 98/71 110/79 112/66   Pulse: (!) 101 100 (!) 105   Resp: (!) 22 (!) 27 (!) 25   Temp:    97.8 F (36.6 C)  TempSrc:      SpO2: (!) 87% (!) 87% (!) 86%   Weight:      Height:        Intake/Output Summary (Last 24 hours) at 10/12/2018 1014 Last data filed at 10/12/2018 0600 Gross per 24 hour  Intake 677 ml  Output 1000 ml  Net -323 ml   Filed Weights   09/26/2018 1638 10/12/18 0353  Weight: 71.7 kg 71.9 kg    Examination:  General exam: Appears anxious, and moderate respiratory distress.  Patient intermittently on high flow oxygen.   Desats on talking. Respiratory system: Bilateral poor air entry at bases.  No additional breath sounds.    Cardiovascular system: S1 & S2 heard, RRR. No JVD, murmurs, rubs, gallops or clicks. No pedal edema. Gastrointestinal system: Abdomen is nondistended, soft and nontender. No organomegaly or masses felt. Normal bowel sounds heard. Central nervous system: Alert and oriented. No focal neurological deficits. Extremities: Symmetric 5 x 5  power. Skin: No rashes, lesions or ulcers Psychiatry: Judgement and insight appear normal. Mood & affect anxious.    Data Reviewed: I have personally reviewed following labs and imaging studies  CBC: Recent Labs  Lab 09/24/2018  1420 09/25/2018 1646 10/09/18 0526 10/10/18 1109 10/11/18 0500 10/12/18 0344  WBC 6.7 7.3 3.5* 6.3 4.9 9.9  NEUTROABS 5.0 5.5  --  5.0 4.4 9.1*  HGB 12.2* 13.1 10.1* 10.4* 10.1* 10.0*  HCT 37.3* 41.3 32.1* 32.5* 31.8* 30.8*  MCV 98.7 101.2* 102.2* 100.0 99.4 100.3*  PLT 153 137* 102* 102* 92* 99*   Basic Metabolic Panel: Recent Labs  Lab 09/28/2018 1646 10/09/18 0526 10/10/18 1109 10/11/18 0500 10/12/18 0344  NA 135 139 138 138 137  K 5.1 4.0 3.5 3.9 4.0  CL 101 111 105 106 106  CO2 23 23 23 24 24   GLUCOSE 120* 96 107* 151* 143*  BUN 17 12 10 17  26*  CREATININE 1.21 0.85 1.07 0.94 0.92  CALCIUM 9.1 7.9* 8.5* 8.9 8.6*  MG  --   --   --  1.8 1.9  PHOS  --   --   --  3.1 2.3*   GFR: Estimated Creatinine Clearance: 68.9 mL/min (by C-G formula based on SCr of 0.92 mg/dL). Liver Function Tests: Recent Labs  Lab 10/04/2018 1420 10/09/18 0526 10/10/18 1109  AST 31 27 31   ALT 15 14 15   ALKPHOS 65 46 54  BILITOT 0.6 0.5 0.8  PROT 7.2 5.8* 6.1*  ALBUMIN 3.0* 2.6* 2.7*   No results for input(s): LIPASE, AMYLASE in the last 168 hours. No results for input(s): AMMONIA in the last 168 hours. Coagulation Profile: No results for input(s): INR, PROTIME in the last 168 hours. Cardiac Enzymes: No results for input(s): CKTOTAL, CKMB, CKMBINDEX, TROPONINI in the last 168 hours. BNP (last 3 results) No results for input(s): PROBNP in the last 8760 hours. HbA1C: No results for input(s): HGBA1C in the last 72 hours. CBG: No results for input(s): GLUCAP in the last 168 hours. Lipid Profile: No results for input(s): CHOL, HDL, LDLCALC, TRIG, CHOLHDL, LDLDIRECT in the last 72 hours. Thyroid Function Tests: No results for input(s): TSH, T4TOTAL, FREET4, T3FREE, THYROIDAB in the last 72 hours. Anemia Panel: No results for input(s): VITAMINB12, FOLATE, FERRITIN, TIBC, IRON, RETICCTPCT in the last 72 hours. Sepsis Labs: Recent Labs  Lab 10/10/18 1007 10/10/18 1109 10/11/18 0500  10/12/18 0344  PROCALCITON <0.10  --  0.14 0.12  LATICACIDVEN  --  0.9  --   --     Recent Results (from the past 240 hour(s))  SARS Coronavirus 2 [facility] order, Performed in [facility] hospital lab) Nasopharyngeal Nasopharyngeal Swab     Status: None   Collection Time: 10/07/2018  4:49 PM   Specimen: Nasopharyngeal Swab  Result Value Ref Range Status   SARS Coronavirus 2 NEGATIVE NEGATIVE Final    Comment: (NOTE) If result is NEGATIVE SARS-CoV-2 target nucleic acids are NOT DETECTED. The SARS-CoV-2 RNA is generally detectable in upper and lower  respiratory specimens during the acute phase of infection. The lowest  concentration of SARS-CoV-2 viral copies this assay can detect is 250  copies / mL. A negative result does not preclude SARS-CoV-2 infection  and should not be used as the sole basis for treatment or other  patient management decisions.  A negative result may occur with  improper specimen collection / handling, submission of specimen other  than nasopharyngeal swab, presence of viral mutation(s) within the  areas  targeted by this assay, and inadequate number of viral copies  (<250 copies / mL). A negative result must be combined with clinical  observations, patient history, and epidemiological information. If result is POSITIVE SARS-CoV-2 target nucleic acids are DETECTED. The SARS-CoV-2 RNA is generally detectable in upper and lower  respiratory specimens dur ing the acute phase of infection.  Positive  results are indicative of active infection with SARS-CoV-2.  Clinical  correlation with patient history and other diagnostic information is  necessary to determine patient infection status.  Positive results do  not rule out bacterial infection or co-infection with other viruses. If result is PRESUMPTIVE POSTIVE SARS-CoV-2 nucleic acids MAY BE PRESENT.   A presumptive positive result was obtained on the submitted specimen  and confirmed on repeat testing.  While  2019 novel coronavirus  (SARS-CoV-2) nucleic acids may be present in the submitted sample  additional confirmatory testing may be necessary for epidemiological  and / or clinical management purposes  to differentiate between  SARS-CoV-2 and other Sarbecovirus currently known to infect humans.  If clinically indicated additional testing with an alternate test  methodology [id number]) is advised. The SARS-CoV-2 RNA is generally  detectable in upper and lower respiratory sp ecimens during the acute  phase of infection. The expected result is Negative. Fact Sheet for Patients:  StrictlyIdeas.no Fact Sheet for Healthcare Providers: BankingDealers.co.za This test is not yet approved or cleared by the [place] FDA and has been authorized for detection and/or diagnosis of SARS-CoV-2 by FDA under an Emergency Use Authorization (EUA).  This EUA will remain in effect (meaning this test can be used) for the duration of the COVID-19 declaration under Section 564(b)(1) of the Act, 21 U.S.C. section 360bbb-3(b)(1), unless the authorization is terminated or revoked sooner. Performed at [facility], [place] [address], [place], [place] [address]   MRSA PCR Screening     Status: None   Collection Time: 10/09/18  4:22 PM   Specimen: Nasal Mucosa; Nasopharyngeal  Result Value Ref Range Status   MRSA by PCR NEGATIVE NEGATIVE Final    Comment:        The GeneXpert MRSA Assay (FDA approved for NASAL specimens only), is one component of a comprehensive MRSA colonization surveillance program. It is not intended to diagnose MRSA infection nor to guide or monitor treatment for MRSA infections. Performed at [facility], [place] [address], [place], [place] [address]   Culture, blood (routine x 2)     Status: None (Preliminary result)   Collection Time: 10/10/18  8:06 AM   Specimen: BLOOD LEFT HAND  Result Value Ref Range  Status   Specimen Description   Final    BLOOD LEFT HAND Performed at [facility] [address] [place], [place] [address]    Special Requests   Final    BOTTLES DRAWN AEROBIC AND ANAEROBIC Blood Culture adequate volume Performed at [facility] [address], [place], [place] [address]    Culture   Final    NO GROWTH < 24 HOURS Performed at [facility] [address] [place], [place] [address]    Report Status PENDING  Incomplete  Culture, blood (routine x 2)     Status: None (Preliminary result)   Collection Time: 10/10/18  8:06 AM   Specimen: BLOOD  Result Value Ref Range Status   Specimen Description   Final    BLOOD RIGHT ARM Performed at [facility]  [address] [place], [place] [address]    Special Requests   Final    BOTTLES DRAWN AEROBIC ONLY Blood Culture adequate volume Performed at [facility] [address], [place], [name] [address]    Culture   Final    NO GROWTH < 24 HOURS Performed at [facility] [address] [place], [place] [address]    Report Status PENDING  Incomplete  Respiratory Panel by PCR     Status: None   Collection Time: 10/10/18 11:32 AM   Specimen: Nasopharyngeal Swab; Respiratory  Result Value Ref Range Status   Adenovirus NOT DETECTED NOT DETECTED Final   Coronavirus 229E NOT DETECTED NOT DETECTED Final    Comment: (NOTE) The Coronavirus on the Respiratory Panel, DOES NOT test for the novel  Coronavirus (2019 nCoV)    Coronavirus HKU1 NOT DETECTED NOT DETECTED Final   Coronavirus NL63 NOT DETECTED NOT DETECTED Final   Coronavirus OC43 NOT DETECTED NOT DETECTED Final   Metapneumovirus NOT DETECTED NOT DETECTED Final   Rhinovirus / Enterovirus NOT DETECTED NOT DETECTED Final   Influenza A NOT DETECTED NOT DETECTED Final   Influenza B NOT DETECTED NOT DETECTED Final   Parainfluenza Virus 1 NOT DETECTED NOT DETECTED Final    Parainfluenza Virus 2 NOT DETECTED NOT DETECTED Final   Parainfluenza Virus 3 NOT DETECTED NOT DETECTED Final   Parainfluenza Virus 4 NOT DETECTED NOT DETECTED Final   Respiratory Syncytial Virus NOT DETECTED NOT DETECTED Final   Bordetella pertussis NOT DETECTED NOT DETECTED Final   Chlamydophila pneumoniae NOT DETECTED NOT DETECTED Final   Mycoplasma pneumoniae NOT DETECTED NOT DETECTED Final    Comment: Performed at [facility], [place]. [address] [place],  [address]         Radiology Studies: No results found.      Scheduled Meds: . apixaban  2.5 mg Oral Daily  . azithromycin  250 mg Oral Daily  . Chlorhexidine Gluconate Cloth  6 each Topical Daily  . famotidine  20 mg Oral QHS  . feeding supplement  1 Container Oral Q24H  . feeding supplement (ENSURE ENLIVE)  237 mL Oral BID BM  . feeding supplement (PRO-STAT SUGAR FREE 64)  30 mL Oral BID  . mouth rinse  15 mL Mouth Rinse BID  . methylPREDNISolone (SOLU-MEDROL) injection  125 mg Intravenous Q6H  . multivitamin with minerals  1 tablet Oral Daily  . pantoprazole  40 mg Oral BID  . sucralfate  1 g Oral QID  . sulfamethoxazole-trimethoprim  1 tablet Oral 3 times weekly  . tamsulosin  0.4 mg Oral QHS   Continuous Infusions: . sodium chloride       LOS: 4 days    Time spent: 25 minutes    Barb Merino, MD [facility] Pager [phone]  If 7PM-7AM, please contact night-coverage [email or web address] Password [email or web address] 10/12/2018, 10:14 AM

## 2018-10-13 LAB — CBC WITH DIFFERENTIAL/PLATELET
Abs Immature Granulocytes: 0.07 K/uL (ref 0.00–0.07)
Basophils Absolute: 0 K/uL (ref 0.0–0.1)
Basophils Relative: 0 %
Eosinophils Absolute: 0 K/uL (ref 0.0–0.5)
Eosinophils Relative: 0 %
HCT: 30.4 % — ABNORMAL LOW (ref 39.0–52.0)
Hemoglobin: 9.5 g/dL — ABNORMAL LOW (ref 13.0–17.0)
Immature Granulocytes: 1 %
Lymphocytes Relative: 3 %
Lymphs Abs: 0.3 K/uL — ABNORMAL LOW (ref 0.7–4.0)
MCH: 31.5 pg (ref 26.0–34.0)
MCHC: 31.3 g/dL (ref 30.0–36.0)
MCV: 100.7 fL — ABNORMAL HIGH (ref 80.0–100.0)
Monocytes Absolute: 0.4 K/uL (ref 0.1–1.0)
Monocytes Relative: 4 %
Neutro Abs: 7.4 K/uL (ref 1.7–7.7)
Neutrophils Relative %: 92 %
Platelets: 92 K/uL — ABNORMAL LOW (ref 150–400)
RBC: 3.02 MIL/uL — ABNORMAL LOW (ref 4.22–5.81)
RDW: 16.2 % — ABNORMAL HIGH (ref 11.5–15.5)
WBC: 8.1 K/uL (ref 4.0–10.5)
nRBC: 0 % (ref 0.0–0.2)

## 2018-10-13 LAB — BASIC METABOLIC PANEL WITH GFR
Anion gap: 8 (ref 5–15)
BUN: 30 mg/dL — ABNORMAL HIGH (ref 8–23)
CO2: 27 mmol/L (ref 22–32)
Calcium: 8.5 mg/dL — ABNORMAL LOW (ref 8.9–10.3)
Chloride: 105 mmol/L (ref 98–111)
Creatinine, Ser: 0.78 mg/dL (ref 0.61–1.24)
GFR calc Af Amer: >60 mL/min (ref >60)
GFR calc non Af Amer: >60 mL/min (ref >60)
Glucose, Bld: 139 mg/dL — ABNORMAL HIGH (ref 70–99)
Potassium: 4.2 mmol/L (ref 3.5–5.1)
Sodium: 140 mmol/L (ref 135–145)

## 2018-10-13 LAB — PHOSPHORUS: Phosphorus: 3.1 mg/dL (ref 2.5–4.6)

## 2018-10-13 LAB — MAGNESIUM: Magnesium: 2.1 mg/dL (ref 1.7–2.4)

## 2018-10-13 MED ORDER — PANTOPRAZOLE SODIUM 40 MG PO TBEC
40.0000 mg | DELAYED_RELEASE_TABLET | Freq: Every day | ORAL | Status: DC
  Administered 2018-10-14 – 2018-10-15 (×2): 40 mg via ORAL
  Filled 2018-10-13 (×2): qty 1

## 2018-10-13 MED ORDER — MORPHINE SULFATE (PF) 2 MG/ML IV SOLN
1.0000 mg | Freq: Once | INTRAVENOUS | Status: AC
  Administered 2018-10-13: 1 mg via INTRAVENOUS

## 2018-10-13 MED ORDER — MORPHINE SULFATE (PF) 2 MG/ML IV SOLN
1.0000 mg | INTRAVENOUS | Status: DC | PRN
  Administered 2018-10-13 – 2018-10-14 (×7): 1 mg via INTRAVENOUS
  Filled 2018-10-13 (×8): qty 1

## 2018-10-13 NOTE — Progress Notes (Signed)
NAME:  Austin Yu, MRN:  [id number], DOB:  11-13-1945, LOS: 5 ADMISSION DATE:  10/04/2018, CONSULTATION DATE:  10/1 REFERRING MD:  Dr. Sloan Leiter, CHIEF COMPLAINT:  SOB   Brief History   73 year old male admitted 9/29 with shortness of breath.  Hx IPF, not on therapy due to SE's (diarrhea). Good functional status prior to diagnosis of adenocarcinoma of the lung (IIIa) & rectal cancer in early 2020. Developed dyspnea in the 3 weeks leading up to admit.  Hypoxic on admit.  ESR 78, PCT <0.10.   Past Medical History  Lung Cancer - Adenocarcinoma Stage IIIa Lung Cancer, EBUS 02/07/18 per Dr. Lamonte Sakai  Idiopathic Pulmonary Fibrosis - followed by Dr. Melvyn Novas  Rectal Cancer -diagnosed 01/21/2018, biopsy positive for invasive adenocarcinoma BPH  DVT - R IJ, on eliquis  GERD [facility] Events   09/29 Admit  10/01 Transferred to SDU for hypotension post lasix  10/3 worsening hypoxia, started low-dose morphine  Consults:  PCCM 10/1  Procedures:     Significant Diagnostic Tests:  CTA Chest 9/29 >> neg for PE, significantly increased lung opacities noted bilaterally concerning for worsening inflammation or edema, moderate sliding-type hiatal hernia, 1.7cm left hilar lymph note, decreased from prior exam  Micro Data:  COVID 9/29 >> negative  BCx2 10/1 >> ng RVP 10/1 >> negative  Antimicrobials:  Azithro 9/29 >> 9/30  Vanco 9/29 >> 10/1 Cefepime 9/30 >> 10/1  Interim history/subjective:   Worsening hypoxia over last 24 hours now needing nonrebreather in addition to 100% high flow Still desaturates on movement or coughing Tachypneic Afebrile   Objective   Blood pressure 125/65, pulse (!) 145, temperature 97.7 F (36.5 C), temperature source Oral, resp. rate (!) 33, height _0  (1.702 m), weight 71.7 kg, SpO2 94 %.    FiO2 (%):  [70 %-100 %] 100 %   Intake/Output Summary (Last 24 hours) at 10/13/2018 1033 Last data filed at 10/13/2018 0400 Gross per 24 hour  Intake  370 ml  Output 875 ml  Net -505 ml   Filed Weights   09/30/2018 1638 10/12/18 0353 10/13/18 0425  Weight: 71.7 kg 71.9 kg 71.7 kg    Examination: General: pleasant elderly male lying in bed in moderate distress on high flow and nonrebreather HEENT: Dry mucosa, no pallor or icterus Neuro: AAOx4, speech clear, grossly nonfocal CV: s1s2 rrr, no m/r/g PULM: Tachypneic, accessory muscles being used lungs , with dry bibasilar crackles R>L GI: soft, bsx4 active  Extremities: warm/dry, no edema  Skin: no rashes or lesions  Chest x-ray from 10/1 personally reviewed which shows increased bilateral interstitial changes CT angiogram reviewed  Resolved Hospital Problem list   Hypotension - in setting of lasix   Assessment & Plan:   Acute Hypoxemic Respiratory Failure  -patient is a never smoker with hx of IIIa adenocarcinoma of the lung, recently completed chemotherapy and XRT 09/18/18.  He has underlying ILD at baseline but little symptom burden from it before chemo/XRT. Developed gradual shortness of breath in the last 3 weeks.  Radiographic review shows worsening of background ground glass / opacities bilaterally since May 2020.  ESR is elevated and PCT negative.  Suspect infiltrates related to ILD flare, appears bilateral so unlikely radiation pneumonitis but possible that radiation has triggered  he has acid reflux and sliding hiatal hernia which puts him at risk for recurrent aspiration.    ILD -not on O2 at baseline  - not on anti-fibrotics due to intolerance   At  Risk Aspiration -possible aspiration event with water 10/1 in setting of dyspnea  P: Continue steroids 173m IV Q6 - started 10/1 -he does not seem to be responding dc PJP prophylaxis -can reinstate if he survives Except saturation 85% and above.  Anticipate desaturation with exertion and slow recovery  Pulmonary hygiene -IS, mobilize  Aspiration precautions  Maximum GERD therapy   Unfortunately he has worsened last 24  hours.  He does not seem to be responding to high-dose steroids.  I do not think BiPAP will be helpful in the situation.  I had frank discussion with him and his daughter, we will increase morphine dosing for comfort 1 to 2 mg every 3 hours and if required progressed to morphine drip Allow more liberal visitation per hospital policy   Code Status: DNR Family Communication: Patient and daughter updated at bedside  The patient is critically ill with multiple organ systems failure and requires high complexity decision making for assessment and support, frequent evaluation and titration of therapies, application of advanced monitoring technologies and extensive interpretation of multiple databases. Critical Care Time devoted to patient care services described in this note independent of APP/resident  time is 32 minutes.     RKara MeadMD. FShade Flood [facility] Pager [phone] no response call [phone]  10/13/2018

## 2018-10-13 NOTE — Progress Notes (Signed)
PROGRESS NOTE    Austin Yu  GEX:[id number] DOB: Jan 29, 1945 DOA: 09/10/2018 PCP: Deland Pretty, MD    Brief Narrative:  73 year old male with history of hyperlipidemia, BPH, GERD, rectal cancer diagnosed on 01/2018, right jugular vein DVT, pulmonary fibrosis not on home oxygen, lung cancer s/p chemotherapy and radiation therapy presented to the emergency room from cancer center with nonproductive cough and progressive shortness of breath.  In the emergency room, he was on 3 L oxygen, CTA of the lungs showed bilateral lower lobe opacities concerning for worsening inflammation or edema.  He has perihilar lymphadenopathy.  09/10/2018: Patient started requiring high levels of oxygen.  Transferred to stepdown unit.  Subjective: Patient continues to require very high flow oxygen.  He is desaturating even on nonrebreather.  Patient can hardly keep up conversation before desaturating.  Denies any nausea or vomiting.  No chest pain.  Afebrile.    Assessment & Plan:   Principal Problem:   Acute respiratory failure with hypoxia (HCC) Active Problems:   IPF (idiopathic pulmonary fibrosis) ([place])   HCAP (healthcare-associated pneumonia)   Sepsis ([place])  Acute hypoxic respiratory failure in the setting of interstitial pulmonary fibrosis, lung cancer with radiation treatment: Initially treated with broad-spectrum antibiotics, WBC count normal.  Procalcitonin normal.  Patient with increasing oxygen requirement. IPF exacerbation.  Treated with very high-dose steroids with no much improvement.  Also on PCP prophylaxis.  Seen and followed by pulmonology.  Currently remains on high-dose IV steroids. chest physiotherapy, incentive spirometry, deep breathing exercises, sputum induction, mucolytic's and bronchodilators. Influenza negative.  Respiratory virus panel negative.  Blood cultures negative so far. Supplemental oxygen to keep saturations more than 90%. Poor prognosis.  Followed by pulmonology.  Patient is not responding appropriately, patient and family aware.  DNR.  History of DVT on the right IJ: Continue Eliquis.  Patient on low-dose Eliquis.  CTA with no evidence of PE.  GERD: On PPI continue.  Stage III lung cancer and rectal cancer: Followed by oncology.  Further treatment plan as per them if patient recovers from pulmonary standpoint.  DVT prophylaxis: Eliquis Code Status: Partial code.  DNR. Family Communication: Communicated to family by pulmonology.  Given poor clinical situation, allowing more family members to visit. Disposition Plan: Inpatient hospitalization, step down.   Consultants:   Oncology  Pulmonary  Procedures:   None  Antimicrobials:   Vancomycin cefepime, 09/23/2018--10/10/2018  Azithromycin, 10/11/2018---   Objective: Vitals:   10/13/18 0900 10/13/18 0915 10/13/18 1000 10/13/18 1100  BP: (!) 85/61 125/65 (!) 102/59 108/67  Pulse: (!) 102 (!) 145 (!) 128 (!) 123  Resp: (!) 24 (!) 33 (!) 36 (!) 39  Temp:      TempSrc:      SpO2: 93% 94% 92% (!) 86%  Weight:      Height:        Intake/Output Summary (Last 24 hours) at 10/13/2018 1257 Last data filed at 10/13/2018 0400 Gross per 24 hour  Intake 360 ml  Output 875 ml  Net -515 ml   Filed Weights   09/28/2018 1638 10/12/18 0353 10/13/18 0425  Weight: 71.7 kg 71.9 kg 71.7 kg    Examination:  General exam: Appears anxious, and moderate respiratory distress.  Patient on nonrebreather.  Desats on talking. Respiratory system: Bilateral poor air entry. No additional breath sounds.    Cardiovascular system: S1 & S2 heard, RRR. No JVD, murmurs, rubs, gallops or clicks. No pedal edema. Gastrointestinal system: Abdomen is nondistended, soft and nontender. No organomegaly or  masses felt. Normal bowel sounds heard. Central nervous system: Alert and oriented. No focal neurological deficits. Extremities: Symmetric 5 x 5 power. Skin: No rashes, lesions or ulcers Psychiatry: Judgement and  insight appear normal. Mood & affect anxious.    Data Reviewed: I have personally reviewed following labs and imaging studies  CBC: Recent Labs  Lab 09/29/2018 1646 10/09/18 0526 10/10/18 1109 10/11/18 0500 10/12/18 0344 10/13/18 0430  WBC 7.3 3.5* 6.3 4.9 9.9 8.1  NEUTROABS 5.5  --  5.0 4.4 9.1* 7.4  HGB 13.1 10.1* 10.4* 10.1* 10.0* 9.5*  HCT 41.3 32.1* 32.5* 31.8* 30.8* 30.4*  MCV 101.2* 102.2* 100.0 99.4 100.3* 100.7*  PLT 137* 102* 102* 92* 99* 92*   Basic Metabolic Panel: Recent Labs  Lab 10/09/18 0526 10/10/18 1109 10/11/18 0500 10/12/18 0344 10/13/18 0430  NA 139 138 138 137 140  K 4.0 3.5 3.9 4.0 4.2  CL 111 105 106 106 105  CO2 23 23 24 24 27   GLUCOSE 96 107* 151* 143* 139*  BUN 12 10 17  26* 30*  CREATININE 0.85 1.07 0.94 0.92 0.78  CALCIUM 7.9* 8.5* 8.9 8.6* 8.5*  MG  --   --  1.8 1.9 2.1  PHOS  --   --  3.1 2.3* 3.1   GFR: Estimated Creatinine Clearance: 79.2 mL/min (by C-G formula based on SCr of 0.78 mg/dL). Liver Function Tests: Recent Labs  Lab 09/20/2018 1420 10/09/18 0526 10/10/18 1109  AST 31 27 31   ALT 15 14 15   ALKPHOS 65 46 54  BILITOT 0.6 0.5 0.8  PROT 7.2 5.8* 6.1*  ALBUMIN 3.0* 2.6* 2.7*   No results for input(s): LIPASE, AMYLASE in the last 168 hours. No results for input(s): AMMONIA in the last 168 hours. Coagulation Profile: No results for input(s): INR, PROTIME in the last 168 hours. Cardiac Enzymes: No results for input(s): CKTOTAL, CKMB, CKMBINDEX, TROPONINI in the last 168 hours. BNP (last 3 results) No results for input(s): PROBNP in the last 8760 hours. HbA1C: No results for input(s): HGBA1C in the last 72 hours. CBG: No results for input(s): GLUCAP in the last 168 hours. Lipid Profile: No results for input(s): CHOL, HDL, LDLCALC, TRIG, CHOLHDL, LDLDIRECT in the last 72 hours. Thyroid Function Tests: No results for input(s): TSH, T4TOTAL, FREET4, T3FREE, THYROIDAB in the last 72 hours. Anemia Panel: No results for  input(s): VITAMINB12, FOLATE, FERRITIN, TIBC, IRON, RETICCTPCT in the last 72 hours. Sepsis Labs: Recent Labs  Lab 10/10/18 1007 10/10/18 1109 10/11/18 0500 10/12/18 0344  PROCALCITON <0.10  --  0.14 0.12  LATICACIDVEN  --  0.9  --   --     Recent Results (from the past 240 hour(s))  SARS Coronavirus 2 [facility] order, Performed in [facility] hospital lab) Nasopharyngeal Nasopharyngeal Swab     Status: None   Collection Time: 09/19/2018  4:49 PM   Specimen: Nasopharyngeal Swab  Result Value Ref Range Status   SARS Coronavirus 2 NEGATIVE NEGATIVE Final    Comment: (NOTE) If result is NEGATIVE SARS-CoV-2 target nucleic acids are NOT DETECTED. The SARS-CoV-2 RNA is generally detectable in upper and lower  respiratory specimens during the acute phase of infection. The lowest  concentration of SARS-CoV-2 viral copies this assay can detect is 250  copies / mL. A negative result does not preclude SARS-CoV-2 infection  and should not be used as the sole basis for treatment or other  patient management decisions.  A negative result may occur with  improper specimen collection /  handling, submission of specimen other  than nasopharyngeal swab, presence of viral mutation(s) within the  areas targeted by this assay, and inadequate number of viral copies  (<250 copies / mL). A negative result must be combined with clinical  observations, patient history, and epidemiological information. If result is POSITIVE SARS-CoV-2 target nucleic acids are DETECTED. The SARS-CoV-2 RNA is generally detectable in upper and lower  respiratory specimens dur ing the acute phase of infection.  Positive  results are indicative of active infection with SARS-CoV-2.  Clinical  correlation with patient history and other diagnostic information is  necessary to determine patient infection status.  Positive results do  not rule out bacterial infection or co-infection with other viruses. If result is PRESUMPTIVE  POSTIVE SARS-CoV-2 nucleic acids MAY BE PRESENT.   A presumptive positive result was obtained on the submitted specimen  and confirmed on repeat testing.  While 2019 novel coronavirus  (SARS-CoV-2) nucleic acids may be present in the submitted sample  additional confirmatory testing may be necessary for epidemiological  and / or clinical management purposes  to differentiate between  SARS-CoV-2 and other Sarbecovirus currently known to infect humans.  If clinically indicated additional testing with an alternate test  methodology [id number]) is advised. The SARS-CoV-2 RNA is generally  detectable in upper and lower respiratory sp ecimens during the acute  phase of infection. The expected result is Negative. Fact Sheet for Patients:  StrictlyIdeas.no Fact Sheet for Healthcare Providers: BankingDealers.co.za This test is not yet approved or cleared by the [place] FDA and has been authorized for detection and/or diagnosis of SARS-CoV-2 by FDA under an Emergency Use Authorization (EUA).  This EUA will remain in effect (meaning this test can be used) for the duration of the COVID-19 declaration under Section 564(b)(1) of the Act, 21 U.S.C. section 360bbb-3(b)(1), unless the authorization is terminated or revoked sooner. Performed at [facility], [place] [address], [place], [place] [address]   MRSA PCR Screening     Status: None   Collection Time: 10/09/18  4:22 PM   Specimen: Nasal Mucosa; Nasopharyngeal  Result Value Ref Range Status   MRSA by PCR NEGATIVE NEGATIVE Final    Comment:        The GeneXpert MRSA Assay (FDA approved for NASAL specimens only), is one component of a comprehensive MRSA colonization surveillance program. It is not intended to diagnose MRSA infection nor to guide or monitor treatment for MRSA infections. Performed at [facility], [place] [address], [place], [place]  [address]   Culture, blood (routine x 2)     Status: None (Preliminary result)   Collection Time: 10/10/18  8:06 AM   Specimen: BLOOD LEFT HAND  Result Value Ref Range Status   Specimen Description   Final    BLOOD LEFT HAND Performed at [place] [address], [place], [place] [address]    Special Requests   Final    BOTTLES DRAWN AEROBIC AND ANAEROBIC Blood Culture adequate volume Performed at [place] [address] [place], [place] [address]    Culture   Final    NO GROWTH 3 DAYS Performed at [facility], [place] [address], [place], [place] [address]    Report Status PENDING  Incomplete  Culture, blood (routine x 2)     Status: None (Preliminary result)   Collection Time: 10/10/18  8:06 AM   Specimen: BLOOD  Result Value Ref Range Status   Specimen Description  Final    BLOOD RIGHT ARM Performed at [place] [address] [place], [place] [address]    Special Requests   Final    BOTTLES DRAWN AEROBIC ONLY Blood Culture adequate volume Performed at [place] [address], [place], [place] [address]    Culture   Final    NO GROWTH 3 DAYS Performed at [facility], [place] [address], [place], [place] [address]    Report Status PENDING  Incomplete  Respiratory Panel by PCR     Status: None   Collection Time: 10/10/18 11:32 AM   Specimen: Nasopharyngeal Swab; Respiratory  Result Value Ref Range Status   Adenovirus NOT DETECTED NOT DETECTED Final   Coronavirus 229E NOT DETECTED NOT DETECTED Final    Comment: (NOTE) The Coronavirus on the Respiratory Panel, DOES NOT test for the novel  Coronavirus (2019 nCoV)    Coronavirus HKU1 NOT DETECTED NOT DETECTED Final   Coronavirus NL63 NOT DETECTED NOT DETECTED Final   Coronavirus OC43 NOT DETECTED NOT DETECTED Final   Metapneumovirus NOT DETECTED NOT DETECTED Final   Rhinovirus / Enterovirus NOT DETECTED NOT DETECTED Final    Influenza A NOT DETECTED NOT DETECTED Final   Influenza B NOT DETECTED NOT DETECTED Final   Parainfluenza Virus 1 NOT DETECTED NOT DETECTED Final   Parainfluenza Virus 2 NOT DETECTED NOT DETECTED Final   Parainfluenza Virus 3 NOT DETECTED NOT DETECTED Final   Parainfluenza Virus 4 NOT DETECTED NOT DETECTED Final   Respiratory Syncytial Virus NOT DETECTED NOT DETECTED Final   Bordetella pertussis NOT DETECTED NOT DETECTED Final   Chlamydophila pneumoniae NOT DETECTED NOT DETECTED Final   Mycoplasma pneumoniae NOT DETECTED NOT DETECTED Final    Comment: Performed at [facility], [place]. [address], [place],  [address]         Radiology Studies: No results found.      Scheduled Meds: . apixaban  2.5 mg Oral Daily  . azithromycin  250 mg Oral Daily  . Chlorhexidine Gluconate Cloth  6 each Topical Daily  . famotidine  20 mg Oral QHS  . feeding supplement  1 Container Oral Q24H  . feeding supplement (ENSURE ENLIVE)  237 mL Oral BID BM  . feeding supplement (PRO-STAT SUGAR FREE 64)  30 mL Oral BID  . mouth rinse  15 mL Mouth Rinse BID  . methylPREDNISolone (SOLU-MEDROL) injection  125 mg Intravenous Q6H  . multivitamin with minerals  1 tablet Oral Daily  . [START ON 10/14/2018] pantoprazole  40 mg Oral Daily  . sucralfate  1 g Oral QID  . tamsulosin  0.4 mg Oral QHS   Continuous Infusions: . sodium chloride       LOS: 5 days    Time spent: 25 minutes    Barb Merino, MD [facility] Pager [phone]  If 7PM-7AM, please contact night-coverage [email or web address] Password [email or web address] 10/13/2018, 12:57 PM

## 2018-10-13 NOTE — Progress Notes (Signed)
Pt was lying in bed, awake and alert when I arrived. His daughters Margarita Grizzle and [place] were bedside. Pt asked for prayer which we had bedside with his daughters. Please page if additional assistance is needed. Star, MDiv   10/13/18 1400  Clinical Encounter Type  Visited With Patient and family together

## 2018-10-14 ENCOUNTER — Inpatient Hospital Stay (HOSPITAL_COMMUNITY): Payer: PPO

## 2018-10-14 MED ORDER — SENNOSIDES-DOCUSATE SODIUM 8.6-50 MG PO TABS
1.0000 | ORAL_TABLET | Freq: Two times a day (BID) | ORAL | Status: DC
  Administered 2018-10-14 – 2018-10-15 (×2): 1 via ORAL
  Filled 2018-10-14 (×3): qty 1

## 2018-10-14 MED ORDER — FUROSEMIDE 10 MG/ML IJ SOLN
20.0000 mg | Freq: Two times a day (BID) | INTRAMUSCULAR | Status: DC
  Administered 2018-10-14 (×2): 20 mg via INTRAVENOUS
  Filled 2018-10-14 (×2): qty 2

## 2018-10-14 MED ORDER — MORPHINE SULFATE (PF) 2 MG/ML IV SOLN
1.0000 mg | INTRAVENOUS | Status: DC | PRN
  Administered 2018-10-14 (×2): 2 mg via INTRAVENOUS
  Administered 2018-10-14: 1 mg via INTRAVENOUS
  Administered 2018-10-14 – 2018-10-15 (×7): 2 mg via INTRAVENOUS
  Filled 2018-10-14 (×9): qty 1

## 2018-10-14 MED ORDER — METHYLPREDNISOLONE SODIUM SUCC 125 MG IJ SOLR
60.0000 mg | Freq: Three times a day (TID) | INTRAMUSCULAR | Status: DC
  Administered 2018-10-14 – 2018-10-15 (×4): 60 mg via INTRAVENOUS
  Filled 2018-10-14 (×4): qty 2

## 2018-10-14 NOTE — Progress Notes (Addendum)
   NAME:  Austin Yu, MRN:  [id number], DOB:  [birth year], LOS: 20 ADMISSION DATE:  09/19/2018, CONSULTATION DATE:  10/1 REFERRING MD:  Dr. Sloan Leiter, CHIEF COMPLAINT:  SOB   Brief History   73 year old male admitted 9/29 with shortness of breath.  Hx IPF, not on therapy due to SE's (diarrhea). Good functional status prior to diagnosis of adenocarcinoma of the lung (IIIa) & rectal cancer in early 2020. Developed dyspnea in the 3 weeks leading up to admit.  Hypoxic on admit.  ESR 78, PCT <0.10.   Past Medical History  Lung Cancer - Adenocarcinoma Stage IIIa Lung Cancer, EBUS 02/07/18 per Dr. Lamonte Sakai  Idiopathic Pulmonary Fibrosis - followed by Dr. Melvyn Novas  Rectal Cancer -diagnosed 01/21/2018, biopsy positive for invasive adenocarcinoma BPH  DVT - R IJ, on eliquis  GERD [place] [facility] Events   09/29 Admit  10/01 Transferred to SDU for hypotension post lasix   Consults:  PCCM 10/1  Procedures:     Significant Diagnostic Tests:  CTA Chest 9/29 >> neg for PE, significantly increased lung opacities noted bilaterally concerning for worsening inflammation or edema, moderate sliding-type hiatal hernia, 1.7cm left hilar lymph note, decreased from prior exam  Micro Data:  COVID 9/29 >> negative  BCx2 10/1 >> ng RVP 10/1 >> negative  Antimicrobials:  Azithro 9/29 >> 9/30  Vanco 9/29 >> 10/1 Cefepime 9/30 >> 10/1  Interim history/subjective:  Not much has changed. Dyspneic with minimal exertion. On maximal O2 (NRB+ 40L 100% HFNC)   Objective   Blood pressure 101/70, pulse 80, temperature 97.8 F (36.6 C), temperature source Oral, resp. rate (!) 22, height _0  (1.702 m), weight 71.7 kg, SpO2 (!) 89 %.    FiO2 (%):  [100 %] 100 %   Intake/Output Summary (Last 24 hours) at 10/14/2018 0728 Last data filed at 10/13/2018 1800 Gross per 24 hour  Intake -  Output 400 ml  Net -400 ml   Filed Weights   10/04/2018 1638 10/12/18 0353 10/13/18 0425  Weight: 71.7 kg 71.9 kg 71.7 kg     Examination: GEN: elderly man in NAD HEENT: NRB in place, trachea mideline CV: RRR, ext warm PULM: diminished bases with crackles, no accessory muscle use GI: Soft, +BS EXT: No edema, + muscle wasting NEURO: Moves all 4 ext to command PSYCH: AOx3, fair insight SKIN: No rashes   Resolved Hospital Problem list   Hypotension - in setting of lasix   Assessment & Plan:  # AE-ILD: imaging, labs, hx supportive of this.  ? GERD contributing given hiatal hernia and lower lobe predominance as well as adenopathy.  Bronchoalveolar recurrence vs. Mets from cancer also possible but less likely given imaging pattern. No meds jumping out as possible cause.  Marked volume loss and progressive fibrotic change on CT.  Unresponsive to steroids portending poor prognosis. - Drop steroids a bit to 69m q8h since not making difference - Recheck CXR looking for effusions or actionable findings - Check limited echo bubble study looking for significant shunting to explain presentation - Pulmonary toileting as able, patient's O2 requirements don't really allow for much, cannot even perform IS - PRN opiates - Continue PPI/H2B/azithromycin/nebs as ordered - Cautious trial of lasix 272mIV BID, trend BMET - Poor prognosis, DNR  # Hx DVT - Continue eliquis  DaErskine EmeryD PCCM

## 2018-10-14 NOTE — Progress Notes (Signed)
PROGRESS NOTE    Austin Yu  ONG:[id number] DOB: 01-29-1945 DOA: 10/02/2018 PCP: Deland Pretty, MD    Brief Narrative:  73 year old male with history of hyperlipidemia, BPH, GERD, rectal cancer diagnosed on 01/2018, right jugular vein DVT, pulmonary fibrosis not on home oxygen, lung cancer s/p chemotherapy and radiation therapy presented to the emergency room from cancer center with nonproductive cough and progressive shortness of breath.  In the emergency room, he was on 3 L oxygen, CTA of the lungs showed bilateral lower lobe opacities concerning for worsening inflammation or edema.  He has perihilar lymphadenopathy.  09/10/2018: Patient started requiring high levels of oxygen.  Transferred to stepdown unit.  Subjective: Remains on high flow oxygen, on nonrebreather along with high flow nasal cannula.  No change in his status.  Denies any nausea or vomiting.  No chest pain.  Afebrile.    Assessment & Plan:   Principal Problem:   Acute respiratory failure with hypoxia (HCC) Active Problems:   IPF (idiopathic pulmonary fibrosis) ([place])   HCAP (healthcare-associated pneumonia)   Sepsis ([place])  Acute hypoxic respiratory failure in the setting of interstitial pulmonary fibrosis, lung cancer with radiation treatment: Acute exacerbation of interstitial pulmonary fibrosis. Treated with very high-dose steroids with no much improvement.  Also on PCP prophylaxis.  Decreasing dose of steroids today. Seen and followed by pulmonology.  Currently remains on high-dose IV steroids. chest physiotherapy, incentive spirometry, deep breathing exercises, sputum induction, mucolytic's and bronchodilators. Influenza negative.  Respiratory virus panel negative.  Blood cultures negative so far. Supplemental oxygen to keep saturations more than 90%. Poor prognosis.  Followed by pulmonology. Patient is not responding appropriately, patient and family aware.  DNR. Repeat chest x-ray today shows no acute  findings, mostly fibrotic lungs. Morphine as needed for air hunger and distress. Trying IV Lasix today.  History of DVT on the right IJ: Continue Eliquis.  Patient on low-dose Eliquis.  CTA with no evidence of PE.  GERD: On PPI continue.  Stage III lung cancer and rectal cancer: Followed by oncology.  Further treatment plan as per them if patient recovers from pulmonary standpoint.  DVT prophylaxis: Eliquis Code Status: Partial code.  DNR. Family Communication: None. Disposition Plan: Inpatient hospitalization, step down.   Consultants:   Oncology  Pulmonary  Procedures:   None  Antimicrobials:   Vancomycin cefepime, 09/13/2018--10/10/2018  Azithromycin, 10/11/2018---   Objective: Vitals:   10/14/18 0600 10/14/18 0700 10/14/18 0800 10/14/18 0900  BP: 101/70 125/89 125/89 120/71  Pulse: 80 (!) 109 (!) 101 91  Resp: (!) 22 (!) 22 (!) 24 (!) 23  Temp:   (!) 97.3 F (36.3 C)   TempSrc:   Axillary   SpO2: (!) 89% 91% 93% 96%  Weight:      Height:        Intake/Output Summary (Last 24 hours) at 10/14/2018 0939 Last data filed at 10/13/2018 1800 Gross per 24 hour  Intake -  Output 400 ml  Net -400 ml   Filed Weights   10/07/2018 1638 10/12/18 0353 10/13/18 0425  Weight: 71.7 kg 71.9 kg 71.7 kg    Examination:  General exam: Appears anxious, and moderate respiratory distress.  Patient on nonrebreather.  Desats on talking. Respiratory system: Bilateral poor air entry. No additional breath sounds.    Cardiovascular system: S1 & S2 heard, RRR. No JVD, murmurs, rubs, gallops or clicks. No pedal edema. Gastrointestinal system: Abdomen is nondistended, soft and nontender. No organomegaly or masses felt. Normal bowel sounds heard. Central  nervous system: Alert and oriented. No focal neurological deficits. Extremities: Symmetric 5 x 5 power. Skin: No rashes, lesions or ulcers Psychiatry: Judgement and insight appear normal. Mood & affect anxious.    Data Reviewed: I  have personally reviewed following labs and imaging studies  CBC: Recent Labs  Lab 09/13/2018 1646 10/09/18 0526 10/10/18 1109 10/11/18 0500 10/12/18 0344 10/13/18 0430  WBC 7.3 3.5* 6.3 4.9 9.9 8.1  NEUTROABS 5.5  --  5.0 4.4 9.1* 7.4  HGB 13.1 10.1* 10.4* 10.1* 10.0* 9.5*  HCT 41.3 32.1* 32.5* 31.8* 30.8* 30.4*  MCV 101.2* 102.2* 100.0 99.4 100.3* 100.7*  PLT 137* 102* 102* 92* 99* 92*   Basic Metabolic Panel: Recent Labs  Lab 10/09/18 0526 10/10/18 1109 10/11/18 0500 10/12/18 0344 10/13/18 0430  NA 139 138 138 137 140  K 4.0 3.5 3.9 4.0 4.2  CL 111 105 106 106 105  CO2 23 23 24 24 27   GLUCOSE 96 107* 151* 143* 139*  BUN 12 10 17  26* 30*  CREATININE 0.85 1.07 0.94 0.92 0.78  CALCIUM 7.9* 8.5* 8.9 8.6* 8.5*  MG  --   --  1.8 1.9 2.1  PHOS  --   --  3.1 2.3* 3.1   GFR: Estimated Creatinine Clearance: 79.2 mL/min (by C-G formula based on SCr of 0.78 mg/dL). Liver Function Tests: Recent Labs  Lab 10/07/2018 1420 10/09/18 0526 10/10/18 1109  AST 31 27 31   ALT 15 14 15   ALKPHOS 65 46 54  BILITOT 0.6 0.5 0.8  PROT 7.2 5.8* 6.1*  ALBUMIN 3.0* 2.6* 2.7*   No results for input(s): LIPASE, AMYLASE in the last 168 hours. No results for input(s): AMMONIA in the last 168 hours. Coagulation Profile: No results for input(s): INR, PROTIME in the last 168 hours. Cardiac Enzymes: No results for input(s): CKTOTAL, CKMB, CKMBINDEX, TROPONINI in the last 168 hours. BNP (last 3 results) No results for input(s): PROBNP in the last 8760 hours. HbA1C: No results for input(s): HGBA1C in the last 72 hours. CBG: No results for input(s): GLUCAP in the last 168 hours. Lipid Profile: No results for input(s): CHOL, HDL, LDLCALC, TRIG, CHOLHDL, LDLDIRECT in the last 72 hours. Thyroid Function Tests: No results for input(s): TSH, T4TOTAL, FREET4, T3FREE, THYROIDAB in the last 72 hours. Anemia Panel: No results for input(s): VITAMINB12, FOLATE, FERRITIN, TIBC, IRON, RETICCTPCT in the  last 72 hours. Sepsis Labs: Recent Labs  Lab 10/10/18 1007 10/10/18 1109 10/11/18 0500 10/12/18 0344  PROCALCITON <0.10  --  0.14 0.12  LATICACIDVEN  --  0.9  --   --     Recent Results (from the past 240 hour(s))  SARS Coronavirus 2 [facility] order, Performed in [facility] hospital lab) Nasopharyngeal Nasopharyngeal Swab     Status: None   Collection Time: 09/12/2018  4:49 PM   Specimen: Nasopharyngeal Swab  Result Value Ref Range Status   SARS Coronavirus 2 NEGATIVE NEGATIVE Final    Comment: (NOTE) If result is NEGATIVE SARS-CoV-2 target nucleic acids are NOT DETECTED. The SARS-CoV-2 RNA is generally detectable in upper and lower  respiratory specimens during the acute phase of infection. The lowest  concentration of SARS-CoV-2 viral copies this assay can detect is 250  copies / mL. A negative result does not preclude SARS-CoV-2 infection  and should not be used as the sole basis for treatment or other  patient management decisions.  A negative result may occur with  improper specimen collection / handling, submission of specimen other  than  nasopharyngeal swab, presence of viral mutation(s) within the  areas targeted by this assay, and inadequate number of viral copies  (<250 copies / mL). A negative result must be combined with clinical  observations, patient history, and epidemiological information. If result is POSITIVE SARS-CoV-2 target nucleic acids are DETECTED. The SARS-CoV-2 RNA is generally detectable in upper and lower  respiratory specimens dur ing the acute phase of infection.  Positive  results are indicative of active infection with SARS-CoV-2.  Clinical  correlation with patient history and other diagnostic information is  necessary to determine patient infection status.  Positive results do  not rule out bacterial infection or co-infection with other viruses. If result is PRESUMPTIVE POSTIVE SARS-CoV-2 nucleic acids MAY BE PRESENT.   A presumptive  positive result was obtained on the submitted specimen  and confirmed on repeat testing.  While 2019 novel coronavirus  (SARS-CoV-2) nucleic acids may be present in the submitted sample  additional confirmatory testing may be necessary for epidemiological  and / or clinical management purposes  to differentiate between  SARS-CoV-2 and other Sarbecovirus currently known to infect humans.  If clinically indicated additional testing with an alternate test  methodology [phone]) is advised. The SARS-CoV-2 RNA is generally  detectable in upper and lower respiratory sp ecimens during the acute  phase of infection. The expected result is Negative. Fact Sheet for Patients:  StrictlyIdeas.no Fact Sheet for Healthcare Providers: BankingDealers.co.za This test is not yet approved or cleared by the [place] FDA and has been authorized for detection and/or diagnosis of SARS-CoV-2 by FDA under an Emergency Use Authorization (EUA).  This EUA will remain in effect (meaning this test can be used) for the duration of the COVID-19 declaration under Section 564(b)(1) of the Act, 21 U.S.C. section 360bbb-3(b)(1), unless the authorization is terminated or revoked sooner. Performed at [facility], [place] [address], [place], [place] [address]   MRSA PCR Screening     Status: None   Collection Time: 10/09/18  4:22 PM   Specimen: Nasal Mucosa; Nasopharyngeal  Result Value Ref Range Status   MRSA by PCR NEGATIVE NEGATIVE Final    Comment:        The GeneXpert MRSA Assay (FDA approved for NASAL specimens only), is one component of a comprehensive MRSA colonization surveillance program. It is not intended to diagnose MRSA infection nor to guide or monitor treatment for MRSA infections. Performed at [facility], [place] [address], [place], [place] [address]   Culture, blood (routine x 2)     Status: None (Preliminary  result)   Collection Time: 10/10/18  8:06 AM   Specimen: BLOOD LEFT HAND  Result Value Ref Range Status   Specimen Description   Final    BLOOD LEFT HAND Performed at [facility] [address], [place], [place] [address]    Special Requests   Final    BOTTLES DRAWN AEROBIC AND ANAEROBIC Blood Culture adequate volume Performed at [facility] [address] [place], [place] [address]    Culture   Final    NO GROWTH 4 DAYS Performed at [facility], [place] [address] [place], [place] [address]    Report Status PENDING  Incomplete  Culture, blood (routine x 2)     Status: None (Preliminary result)   Collection Time: 10/10/18  8:06 AM   Specimen: BLOOD  Result Value Ref Range Status   Specimen Description   Final    BLOOD RIGHT  ARM Performed at [facility], [place] [address], [place], [place] [address]    Special Requests   Final    BOTTLES DRAWN AEROBIC ONLY Blood Culture adequate volume Performed at [facility] [address], [place], [place] [address]    Culture   Final    NO GROWTH 4 DAYS Performed at [facility], [place] [address], [place], [place] [address]    Report Status PENDING  Incomplete  Respiratory Panel by PCR     Status: None   Collection Time: 10/10/18 11:32 AM   Specimen: Nasopharyngeal Swab; Respiratory  Result Value Ref Range Status   Adenovirus NOT DETECTED NOT DETECTED Final   Coronavirus 229E NOT DETECTED NOT DETECTED Final    Comment: (NOTE) The Coronavirus on the Respiratory Panel, DOES NOT test for the novel  Coronavirus (2019 nCoV)    Coronavirus HKU1 NOT DETECTED NOT DETECTED Final   Coronavirus NL63 NOT DETECTED NOT DETECTED Final   Coronavirus OC43 NOT DETECTED NOT DETECTED Final   Metapneumovirus NOT DETECTED NOT DETECTED Final   Rhinovirus / Enterovirus NOT DETECTED NOT DETECTED Final   Influenza A NOT DETECTED NOT DETECTED Final   Influenza B NOT  DETECTED NOT DETECTED Final   Parainfluenza Virus 1 NOT DETECTED NOT DETECTED Final   Parainfluenza Virus 2 NOT DETECTED NOT DETECTED Final   Parainfluenza Virus 3 NOT DETECTED NOT DETECTED Final   Parainfluenza Virus 4 NOT DETECTED NOT DETECTED Final   Respiratory Syncytial Virus NOT DETECTED NOT DETECTED Final   Bordetella pertussis NOT DETECTED NOT DETECTED Final   Chlamydophila pneumoniae NOT DETECTED NOT DETECTED Final   Mycoplasma pneumoniae NOT DETECTED NOT DETECTED Final    Comment: Performed at [facility], [place]. [address] [place], [place] [address]         Radiology Studies: Dg Chest 1 View  Result Date: 10/14/2018 CLINICAL DATA:  Shortness of breath. Stage IIIA lung cancer. History of pulmonary fibrosis. EXAM: CHEST  1 VIEW COMPARISON:  10/06/2018 and 10/10/2018. FINDINGS: Right chest wall port a catheter is identified with tip at the cavoatrial junction. Stable cardiac enlargement. Decreased lung volumes with asymmetric elevation of right hemidiaphragm. Underlying fracture ought interstitial lung disease is again identified. No superimposed airspace or interstitial opacities. IMPRESSION: 1. No acute findings. 2. Chronic pulmonary fibrosis with asymmetric elevation of right hemidiaphragm. Electronically Signed   By: Kerby Moors M.D.   On: 10/14/2018 09:05        Scheduled Meds: . apixaban  2.5 mg Oral Daily  . azithromycin  250 mg Oral Daily  . Chlorhexidine Gluconate Cloth  6 each Topical Daily  . famotidine  20 mg Oral QHS  . feeding supplement  1 Container Oral Q24H  . feeding supplement (ENSURE ENLIVE)  237 mL Oral BID BM  . feeding supplement (PRO-STAT SUGAR FREE 64)  30 mL Oral BID  . furosemide  20 mg Intravenous Q12H  . mouth rinse  15 mL Mouth Rinse BID  . methylPREDNISolone (SOLU-MEDROL) injection  60 mg Intravenous Q8H  . multivitamin with minerals  1 tablet Oral Daily  . pantoprazole  40 mg Oral Daily  . senna-docusate  1 tablet Oral BID  .  sucralfate  1 g Oral QID  . tamsulosin  0.4 mg Oral QHS   Continuous Infusions: . sodium chloride       LOS: 6 days    Time spent: 25 minutes    Barb Merino, MD [facility] Pager  [phone]  If 7PM-7AM, please contact night-coverage [email or web address] Password [email or web address] 10/14/2018, 9:39 AM

## 2018-10-14 NOTE — Progress Notes (Signed)
  Echocardiogram 2D Echocardiogram was reattempted. Patient requests echo be performed tomorrow.  Kerly Rigsbee G Anastashia Westerfeld 10/14/2018, 4:14 PM

## 2018-10-14 NOTE — Progress Notes (Signed)
  Echocardiogram 2D Echocardiogram has been attempted. Patient HR 135. Will reattempt after given meds. HR too high.  Laticha Ferrucci G Kolbie Clarkston 10/14/2018, 10:26 AM

## 2018-10-14 NOTE — Progress Notes (Signed)
Placed new water bottle on heated high flow nasal cannula.

## 2018-10-14 NOTE — Progress Notes (Signed)
[place] for Apixaban Indication: DVT  No Known Allergies  Patient Measurements: Height: 5\' 7"  (170.2 cm) Weight: 158 lb 1.1 oz (71.7 kg) IBW/kg (Calculated) : 66.1  Vital Signs: Temp: 97.3 F (36.3 C) (10/05 0800) Temp Source: Axillary (10/05 0800) BP: 105/77 (10/05 1100) Pulse Rate: 107 (10/05 1100)  Labs: Recent Labs    10/12/18 0344 10/13/18 0430  HGB 10.0* 9.5*  HCT 30.8* 30.4*  PLT 99* 92*  CREATININE 0.92 0.78   Estimated Creatinine Clearance: 79.2 mL/min (by C-G formula based on SCr of 0.78 mg/dL).  Medical History: Past Medical History:  Diagnosis Date  . Abnormal chest sounds   . BPH (benign prostatic hypertrophy)   . Cancer [facility])    rectum cancer; dx 01/21/2018 by Dr. Loletha Carrow by colonoscopy  . Complication of anesthesia    ISSUE W/ EXTUBATION 05-18-2015 IN [facility] -- REFER TO ANESTHESIA NOTE''S;    . DVT (deep venous thrombosis) (HCC)    right jugular vein. now on eliquis   . Dyspnea    ongoing d/t hx of pulmonary fibrosis   . Elevated PSA   . GERD (gastroesophageal reflux disease)   . Hematuria    ONGOING   . History of airway aspiration   . History of kidney stones   . Hyperlipidemia   . Hyperplasia of prostate without lower urinary tract symptoms (LUTS)   . Left ureteral stone   . Lung nodule   . Nephrolithiasis   . Postinflammatory pulmonary fibrosis (HCC)    PULMOLOGIST-  DR FFMB-  IDIOPATHIC   . Pulmonary fibrosis ([place])   . Rectal cancer ([place])   . Sinus tachycardia by electrocardiogram    states he was told this was caused by chemo drugs; now compelted chemo, reports no acute cardiac symtoms,   . [place] hemangioma of skin   . Upper airway cough syndrome    PULMOLOGIST-  DR WGYK   Medications:  Scheduled:  . apixaban  2.5 mg Oral Daily  . azithromycin  250 mg Oral Daily  . Chlorhexidine Gluconate Cloth  6 each Topical Daily  . famotidine  20 mg Oral QHS  . feeding supplement  1 Container Oral  Q24H  . feeding supplement (ENSURE ENLIVE)  237 mL Oral BID BM  . feeding supplement (PRO-STAT SUGAR FREE 64)  30 mL Oral BID  . furosemide  20 mg Intravenous Q12H  . mouth rinse  15 mL Mouth Rinse BID  . methylPREDNISolone (SOLU-MEDROL) injection  60 mg Intravenous Q8H  . multivitamin with minerals  1 tablet Oral Daily  . pantoprazole  40 mg Oral Daily  . senna-docusate  1 tablet Oral BID  . sucralfate  1 g Oral QID  . tamsulosin  0.4 mg Oral QHS   Assessment: 73 y.o. male admitted on 09/25/2018 with pneumonia.  Pharmacy requested to dose Apixaban, home dose 2.5mg  daily - as on PTA per oncology note on 08/30/18 for thrombocytopenia  10/14/2018 Scr 0.78, CrCl ~ 79.53mls/min Hgb and plts both low but stable  Goal of Therapy:  Monitor platelets by anticoagulation protocol: Yes   Plan:  Apixaban 2.5mg  daily Pharmacy will sign off and follow peripherally  Dolly Rias RPh 10/14/2018, 12:13 PM Pager [phone]

## 2018-10-15 ENCOUNTER — Other Ambulatory Visit (HOSPITAL_COMMUNITY): Payer: PPO

## 2018-10-15 ENCOUNTER — Inpatient Hospital Stay (HOSPITAL_COMMUNITY): Payer: PPO

## 2018-10-15 DIAGNOSIS — J9601 Acute respiratory failure with hypoxia: Secondary | ICD-10-CM

## 2018-10-15 LAB — CULTURE, BLOOD (ROUTINE X 2)
Culture: NO GROWTH
Culture: NO GROWTH
Special Requests: ADEQUATE
Special Requests: ADEQUATE

## 2018-10-15 LAB — CBC WITH DIFFERENTIAL/PLATELET
Abs Immature Granulocytes: 0.05 10*3/uL (ref 0.00–0.07)
Basophils Absolute: 0 10*3/uL (ref 0.0–0.1)
Basophils Relative: 0 %
Eosinophils Absolute: 0 10*3/uL (ref 0.0–0.5)
Eosinophils Relative: 0 %
HCT: 36.3 % — ABNORMAL LOW (ref 39.0–52.0)
Hemoglobin: 11.3 g/dL — ABNORMAL LOW (ref 13.0–17.0)
Immature Granulocytes: 1 %
Lymphocytes Relative: 4 %
Lymphs Abs: 0.4 10*3/uL — ABNORMAL LOW (ref 0.7–4.0)
MCH: 31.6 pg (ref 26.0–34.0)
MCHC: 31.1 g/dL (ref 30.0–36.0)
MCV: 101.4 fL — ABNORMAL HIGH (ref 80.0–100.0)
Monocytes Absolute: 0.4 10*3/uL (ref 0.1–1.0)
Monocytes Relative: 5 %
Neutro Abs: 8.2 10*3/uL — ABNORMAL HIGH (ref 1.7–7.7)
Neutrophils Relative %: 90 %
Platelets: 95 10*3/uL — ABNORMAL LOW (ref 150–400)
RBC: 3.58 MIL/uL — ABNORMAL LOW (ref 4.22–5.81)
RDW: 16 % — ABNORMAL HIGH (ref 11.5–15.5)
WBC: 9.1 10*3/uL (ref 4.0–10.5)
nRBC: 0.2 % (ref 0.0–0.2)

## 2018-10-15 LAB — BASIC METABOLIC PANEL
Anion gap: 11 (ref 5–15)
BUN: 35 mg/dL — ABNORMAL HIGH (ref 8–23)
CO2: 35 mmol/L — ABNORMAL HIGH (ref 22–32)
Calcium: 8.8 mg/dL — ABNORMAL LOW (ref 8.9–10.3)
Chloride: 96 mmol/L — ABNORMAL LOW (ref 98–111)
Creatinine, Ser: 0.74 mg/dL (ref 0.61–1.24)
GFR calc Af Amer: >60 mL/min (ref >60)
GFR calc non Af Amer: >60 mL/min (ref >60)
Glucose, Bld: 132 mg/dL — ABNORMAL HIGH (ref 70–99)
Potassium: 4.2 mmol/L (ref 3.5–5.1)
Sodium: 142 mmol/L (ref 135–145)

## 2018-10-15 LAB — MAGNESIUM: Magnesium: 2.3 mg/dL (ref 1.7–2.4)

## 2018-10-15 LAB — PHOSPHORUS: Phosphorus: 4.2 mg/dL (ref 2.5–4.6)

## 2018-10-15 MED ORDER — RAMELTEON 8 MG PO TABS
8.0000 mg | ORAL_TABLET | Freq: Every day | ORAL | Status: DC
  Filled 2018-10-15 (×2): qty 1

## 2018-10-15 MED ORDER — METHYLPREDNISOLONE SODIUM SUCC 40 MG IJ SOLR
40.0000 mg | Freq: Two times a day (BID) | INTRAMUSCULAR | Status: DC
  Administered 2018-10-15: 40 mg via INTRAVENOUS
  Filled 2018-10-15: qty 1

## 2018-10-15 MED ORDER — CLONAZEPAM 0.5 MG PO TABS
0.5000 mg | ORAL_TABLET | Freq: Two times a day (BID) | ORAL | Status: DC
  Administered 2018-10-15: 0.5 mg via ORAL
  Filled 2018-10-15: qty 1

## 2018-10-15 MED ORDER — MORPHINE 100MG IN NS 100ML (1MG/ML) PREMIX INFUSION
2.0000 mg/h | INTRAVENOUS | Status: DC
  Administered 2018-10-15: 2 mg/h via INTRAVENOUS
  Administered 2018-10-15: 1 mg/h via INTRAVENOUS
  Filled 2018-10-15: qty 100

## 2018-10-15 MED ORDER — LORAZEPAM 2 MG/ML IJ SOLN: 1.0000 mg | INTRAMUSCULAR | Status: DC | PRN

## 2018-10-15 NOTE — Progress Notes (Signed)
  Echocardiogram 2D Echocardiogram has been performed.  Oneal Deputy Piers Baade 10/15/2018, 8:48 AM

## 2018-10-15 NOTE — Progress Notes (Signed)
Patient is very anxious. He wants to watch the monitor for sats and BP request we take BP at least every hour. He has denied any pain however he request we give him Morphine every 2-3 hrs for his breathing and got upset when he slept longer. We attempted a CHG bath mainly front and his sats dropped with any of his movements to even lift arms. Repositioned and stayed with him till his sats and HR improved.

## 2018-10-15 NOTE — Progress Notes (Signed)
Patient and daughters are comfortable transitioning to morphine drip and focusing on comfort at end of life.  Expect in hospital death given O2 requirements.  Primary team sent message.  Erskine Emery MD PCCM

## 2018-10-15 NOTE — Progress Notes (Signed)
PROGRESS NOTE    Austin Yu  PXT:[id number] DOB: May 28, 1945 DOA: 09/28/2018 PCP: Deland Pretty, MD    Brief Narrative:  73 year old male with history of hyperlipidemia, BPH, GERD, rectal cancer diagnosed on 01/2018, right jugular vein DVT, idiopathic  pulmonary fibrosis not on home oxygen, lung cancer s/p chemotherapy and radiation therapy presented to the emergency room from cancer center with nonproductive cough and progressive shortness of breath.  In the emergency room, he was on 3 L oxygen, CTA of the lungs showed bilateral lower lobe opacities concerning for worsening inflammation or edema.  He has perihilar lymphadenopathy.  09/10/2018: Patient started requiring high levels of oxygen.  Transferred to stepdown unit.  Subjective: Restless night, desats on minimal sentences, remains on nonrebreather along with high flow nasal cannula oxygen.    Assessment & Plan:   Principal Problem:   Acute respiratory failure with hypoxia (HCC) Active Problems:   IPF (idiopathic pulmonary fibrosis) (Austin Yu)   HCAP (healthcare-associated pneumonia)   Sepsis (Austin Yu)  Acute hypoxic respiratory failure in the setting of interstitial pulmonary fibrosis, lung cancer with radiation treatment: Acute exacerbation of interstitial pulmonary fibrosis. Treated with very high-dose steroids with no much improvement.  Also on PCP prophylaxis.  Decreasing dose of steroids further today. Seen and followed by pulmonology.  Chest physiotherapy as able.  Currently unable to participate. Influenza negative.  Respiratory virus panel negative.  Blood cultures negative so far. Supplemental oxygen to keep saturations more than 90%. Poor prognosis.  Followed by pulmonology. Patient is not responding appropriately, patient and family aware.  DNR. Echocardiogram with no right-to-left shunt.  History of DVT on the right IJ: Continue Eliquis.  Patient on low-dose Eliquis.  CTA with no evidence of PE.  GERD: On PPI continue.   Stage III lung cancer and rectal cancer: Followed by oncology.  Currently unable to undergo any treatment.  Patient remains in poor clinical status, very high oxygen requirement and progressive disease, hypoxic even on speaking or eating.  He is receiving end-of-life. Myself and critical care physician met with patient's 2 daughters at bedside.  They were aware about ongoing decline. Patient is sleepy, had recently received morphine for restlessness. Discussed about comfort care measures and transition to hospice, daughters agree, waiting for patient to wake up and agree with final recommendation is starting to decrease high level oxygen and keep comfortable with infusion morphine.  We will revisit to talk about starting comfort care and transition to hospice.   DVT prophylaxis: Eliquis Code Status: Partial code.  DNR.  Unable to take most of the few medications. Family Communication: None. Disposition Plan: Pending.   Consultants:   Oncology  Pulmonary  Procedures:   None  Antimicrobials:   Vancomycin cefepime, 09/26/2018--10/10/2018  Azithromycin, 10/11/2018---   Objective: Vitals:   10/15/18 0500 10/15/18 0600 10/15/18 0700 10/15/18 0817  BP: 121/81 109/74 122/64   Pulse: (!) 112 91 (!) 104 (!) 131  Resp: (!) 33 15 (!) 39 (!) 21  Temp:    (!) 97.5 F (36.4 C)  TempSrc:    Axillary  SpO2: (!) 88% 93% 92% 90%  Weight:      Height:        Intake/Output Summary (Last 24 hours) at 10/15/2018 1223 Last data filed at 10/15/2018 0600 Gross per 24 hour  Intake 1080 ml  Output 825 ml  Net 255 ml   Filed Weights   09/23/2018 1638 10/12/18 0353 10/13/18 0425  Weight: 71.7 kg 71.9 kg 71.7 kg    Examination:  General exam: Sleepy, lethargic, sick looking on nonrebreather.  Unable to keep her conversations.  On very high flow oxygen. Respiratory system: Bilateral poor air entry.  Mostly conducted airway sounds.    Cardiovascular system: S1 & S2 heard, RRR.  Tachycardic.   No JVD, murmurs, rubs, gallops or clicks. No pedal edema. Gastrointestinal system: Abdomen is nondistended, soft and nontender. No organomegaly or masses felt. Normal bowel sounds heard. Central nervous system: Sleepy and lethargic today.  no focal neurological deficits. Extremities: Symmetric 5 x 5 power. Skin: No rashes, lesions or ulcers Psychiatry: Judgement and insight appear compromised.  Mood & affect anxious.    Data Reviewed: I have personally reviewed following labs and imaging studies  CBC: Recent Labs  Lab 10/10/18 1109 10/11/18 0500 10/12/18 0344 10/13/18 0430 10/15/18 0500  WBC 6.3 4.9 9.9 8.1 9.1  NEUTROABS 5.0 4.4 9.1* 7.4 8.2*  HGB 10.4* 10.1* 10.0* 9.5* 11.3*  HCT 32.5* 31.8* 30.8* 30.4* 36.3*  MCV 100.0 99.4 100.3* 100.7* 101.4*  PLT 102* 92* 99* 92* 95*   Basic Metabolic Panel: Recent Labs  Lab 10/10/18 1109 10/11/18 0500 10/12/18 0344 10/13/18 0430 10/15/18 0500  NA 138 138 137 140 142  K 3.5 3.9 4.0 4.2 4.2  CL 105 106 106 105 96*  CO2 '23 24 24 27 ' 35*  GLUCOSE 107* 151* 143* 139* 132*  BUN 10 17 26* 30* 35*  CREATININE 1.07 0.94 0.92 0.78 0.74  CALCIUM 8.5* 8.9 8.6* 8.5* 8.8*  MG  --  1.8 1.9 2.1 2.3  PHOS  --  3.1 2.3* 3.1 4.2   GFR: Estimated Creatinine Clearance: 79.2 mL/min (by C-G formula based on SCr of 0.74 mg/dL). Liver Function Tests: Recent Labs  Lab 09/11/2018 1420 10/09/18 0526 10/10/18 1109  AST '31 27 31  ' ALT '15 14 15  ' ALKPHOS 65 46 54  BILITOT 0.6 0.5 0.8  PROT 7.2 5.8* 6.1*  ALBUMIN 3.0* 2.6* 2.7*   No results for input(s): LIPASE, AMYLASE in the last 168 hours. No results for input(s): AMMONIA in the last 168 hours. Coagulation Profile: No results for input(s): INR, PROTIME in the last 168 hours. Cardiac Enzymes: No results for input(s): CKTOTAL, CKMB, CKMBINDEX, TROPONINI in the last 168 hours. BNP (last 3 results) No results for input(s): PROBNP in the last 8760 hours. HbA1C: No results for input(s): HGBA1C in  the last 72 hours. CBG: No results for input(s): GLUCAP in the last 168 hours. Lipid Profile: No results for input(s): CHOL, HDL, LDLCALC, TRIG, CHOLHDL, LDLDIRECT in the last 72 hours. Thyroid Function Tests: No results for input(s): TSH, T4TOTAL, FREET4, T3FREE, THYROIDAB in the last 72 hours. Anemia Panel: No results for input(s): VITAMINB12, FOLATE, FERRITIN, TIBC, IRON, RETICCTPCT in the last 72 hours. Sepsis Labs: Recent Labs  Lab 10/10/18 1007 10/10/18 1109 10/11/18 0500 10/12/18 0344  PROCALCITON <0.10  --  0.14 0.12  LATICACIDVEN  --  0.9  --   --     Recent Results (from the past 240 hour(s))  SARS Coronavirus 2 [facility] order, Performed in [facility] hospital lab) Nasopharyngeal Nasopharyngeal Swab     Status: None   Collection Time: 09/22/2018  4:49 PM   Specimen: Nasopharyngeal Swab  Result Value Ref Range Status   SARS Coronavirus 2 NEGATIVE NEGATIVE Final    Comment: (NOTE) If result is NEGATIVE SARS-CoV-2 target nucleic acids are NOT DETECTED. The SARS-CoV-2 RNA is generally detectable in upper and lower  respiratory specimens during the acute phase of infection. The lowest  concentration of SARS-CoV-2 viral copies this assay can detect is 250  copies / mL. A negative result does not preclude SARS-CoV-2 infection  and should not be used as the sole basis for treatment or other  patient management decisions.  A negative result may occur with  improper specimen collection / handling, submission of specimen other  than nasopharyngeal swab, presence of viral mutation(s) within the  areas targeted by this assay, and inadequate number of viral copies  (<250 copies / mL). A negative result must be combined with clinical  observations, patient history, and epidemiological information. If result is POSITIVE SARS-CoV-2 target nucleic acids are DETECTED. The SARS-CoV-2 RNA is generally detectable in upper and lower  respiratory specimens dur ing the acute phase of  infection.  Positive  results are indicative of active infection with SARS-CoV-2.  Clinical  correlation with patient history and other diagnostic information is  necessary to determine patient infection status.  Positive results do  not rule out bacterial infection or co-infection with other viruses. If result is PRESUMPTIVE POSTIVE SARS-CoV-2 nucleic acids MAY BE PRESENT.   A presumptive positive result was obtained on the submitted specimen  and confirmed on repeat testing.  While 2019 novel coronavirus  (SARS-CoV-2) nucleic acids may be present in the submitted sample  additional confirmatory testing may be necessary for epidemiological  and / or clinical management purposes  to differentiate between  SARS-CoV-2 and other Sarbecovirus currently known to infect humans.  If clinically indicated additional testing with an alternate test  methodology [phone]) is advised. The SARS-CoV-2 RNA is generally  detectable in upper and lower respiratory sp ecimens during the acute  phase of infection. The expected result is Negative. Fact Sheet for Patients:  StrictlyIdeas.no Fact Sheet for Healthcare Providers: BankingDealers.co.za This test is not yet approved or cleared by the [place] FDA and has been authorized for detection and/or diagnosis of SARS-CoV-2 by FDA under an Emergency Use Authorization (EUA).  This EUA will remain in effect (meaning this test can be used) for the duration of the COVID-19 declaration under Section 564(b)(1) of the Act, 21 U.S.C. section 360bbb-3(b)(1), unless the authorization is terminated or revoked sooner. Performed at [facility], [place] [address] [place], [place] [address]   MRSA PCR Screening     Status: None   Collection Time: 10/09/18  4:22 PM   Specimen: Nasal Mucosa; Nasopharyngeal  Result Value Ref Range Status   MRSA by PCR NEGATIVE NEGATIVE Final    Comment:        The  GeneXpert MRSA Assay (FDA approved for NASAL specimens only), is one component of a comprehensive MRSA colonization surveillance program. It is not intended to diagnose MRSA infection nor to guide or monitor treatment for MRSA infections. Performed at [facility], [place] [address] [place], [place] [address]   Culture, blood (routine x 2)     Status: None   Collection Time: 10/10/18  8:06 AM   Specimen: BLOOD LEFT HAND  Result Value Ref Range Status   Specimen Description   Final    BLOOD LEFT HAND Performed at [place] [address] [place], [place] [address]    Special Requests   Final    BOTTLES DRAWN AEROBIC AND ANAEROBIC Blood Culture adequate volume Performed at [place] [address], [place], [place] [address]    Culture   Final    NO GROWTH 5 DAYS Performed at [facility], [address] [place], [place] [address]    Report Status 10/15/2018 FINAL  Final  Culture, blood (routine x 2)     Status: None   Collection Time: 10/10/18  8:06 AM   Specimen: BLOOD  Result Value Ref Range Status   Specimen Description   Final    BLOOD RIGHT ARM Performed at [place] [address] [place], [place] [address]    Special Requests   Final    BOTTLES DRAWN AEROBIC ONLY Blood Culture adequate volume Performed at [place] [address], [place], [place] [address]    Culture   Final    NO GROWTH 5 DAYS Performed at [facility], [place] [address], [place], [place] [address]    Report Status 10/15/2018 FINAL  Final  Respiratory Panel by PCR     Status: None   Collection Time: 10/10/18 11:32 AM   Specimen: Nasopharyngeal Swab; Respiratory  Result Value Ref Range Status   Adenovirus NOT DETECTED NOT DETECTED Final   Coronavirus 229E NOT DETECTED NOT DETECTED Final    Comment: (NOTE) The Coronavirus on the Respiratory Panel, DOES NOT test for the  novel  Coronavirus (2019 nCoV)    Coronavirus HKU1 NOT DETECTED NOT DETECTED Final   Coronavirus NL63 NOT DETECTED NOT DETECTED Final   Coronavirus OC43 NOT DETECTED NOT DETECTED Final   Metapneumovirus NOT DETECTED NOT DETECTED Final   Rhinovirus / Enterovirus NOT DETECTED NOT DETECTED Final   Influenza A NOT DETECTED NOT DETECTED Final   Influenza B NOT DETECTED NOT DETECTED Final   Parainfluenza Virus 1 NOT DETECTED NOT DETECTED Final   Parainfluenza Virus 2 NOT DETECTED NOT DETECTED Final   Parainfluenza Virus 3 NOT DETECTED NOT DETECTED Final   Parainfluenza Virus 4 NOT DETECTED NOT DETECTED Final   Respiratory Syncytial Virus NOT DETECTED NOT DETECTED Final   Bordetella pertussis NOT DETECTED NOT DETECTED Final   Chlamydophila pneumoniae NOT DETECTED NOT DETECTED Final   Mycoplasma pneumoniae NOT DETECTED NOT DETECTED Final    Comment: Performed at [facility], [place] [address], [place], [place] [address]         Radiology Studies: Dg Chest 1 View  Result Date: 10/14/2018 CLINICAL DATA:  Shortness of breath. Stage IIIA lung cancer. History of pulmonary fibrosis. EXAM: CHEST  1 VIEW COMPARISON:  09/19/2018 and 10/10/2018. FINDINGS: Right chest wall port a catheter is identified with tip at the cavoatrial junction. Stable cardiac enlargement. Decreased lung volumes with asymmetric elevation of right hemidiaphragm. Underlying fracture ought interstitial lung disease is again identified. No superimposed airspace or interstitial opacities. IMPRESSION: 1. No acute findings. 2. Chronic pulmonary fibrosis with asymmetric elevation of right hemidiaphragm. Electronically Signed   By: Kerby Moors M.D.   On: 10/14/2018 09:05        Scheduled Meds: . apixaban  2.5 mg Oral Daily  . Chlorhexidine Gluconate Cloth  6 each Topical Daily  . clonazePAM  0.5 mg Oral BID  . famotidine  20 mg Oral QHS  . feeding supplement  1 Container Oral Q24H  . feeding supplement (ENSURE ENLIVE)   237 mL Oral BID BM  . feeding supplement (PRO-STAT SUGAR FREE 64)  30 mL Oral BID  . mouth rinse  15 mL Mouth Rinse BID  . methylPREDNISolone (SOLU-MEDROL) injection  40 mg Intravenous Q12H  . multivitamin with minerals  1 tablet Oral Daily  . pantoprazole  40 mg Oral Daily  . ramelteon  8 mg  Oral QHS  . senna-docusate  1 tablet Oral BID  . sucralfate  1 g Oral QID  . tamsulosin  0.4 mg Oral QHS   Continuous Infusions: . sodium chloride       LOS: 7 days    Time spent: 25 minutes    Barb Merino, MD [facility] Pager [phone]  If 7PM-7AM, please contact night-coverage [email or web address] Password [email or web address] 10/15/2018, 12:23 PM

## 2018-10-15 NOTE — Progress Notes (Signed)
   NAME:  Austin Yu, MRN:  [id number], DOB:  [birth year], LOS: 7 ADMISSION DATE:  09/14/2018, CONSULTATION DATE:  10/1 REFERRING MD:  Dr. Sloan Leiter, CHIEF COMPLAINT:  SOB   Brief History   73 year old male admitted 9/29 with shortness of breath.  Hx IPF, not on therapy due to SE's (diarrhea). Good functional status prior to diagnosis of adenocarcinoma of the lung (IIIa) & rectal cancer in early 2020. Developed dyspnea in the 3 weeks leading up to admit.  Hypoxic on admit.  ESR 78, PCT <0.10.   Past Medical History  Lung Cancer - Adenocarcinoma Stage IIIa Lung Cancer, EBUS 02/07/18 per Dr. Lamonte Sakai  Idiopathic Pulmonary Fibrosis - followed by Dr. Melvyn Novas  Rectal Cancer -diagnosed 01/21/2018, biopsy positive for invasive adenocarcinoma BPH  DVT - R IJ, on eliquis  GERD [facility]   09/29 Admit  10/01 Transferred to SDU for hypotension post lasix   Consults:  PCCM 10/1  Procedures:     Significant Diagnostic Tests:  CTA Chest 9/29 >> neg for PE, significantly increased lung opacities noted bilaterally concerning for worsening inflammation or edema, moderate sliding-type hiatal hernia, 1.7cm left hilar lymph note, decreased from prior exam  Micro Data:  COVID 9/29 >> negative  BCx2 10/1 >> ng RVP 10/1 >> negative  Antimicrobials:  Azithro 9/29 >> 9/30  Vanco 9/29 >> 10/1 Cefepime 9/30 >> 10/1  Interim history/subjective:  Remains on maximal noninvasive supplemental oxygen. Minimal cough. Desats with any movement. Very anxious. Slept poorly.   Objective   Blood pressure 109/74, pulse 91, temperature (!) 97.4 F (36.3 C), temperature source Oral, resp. rate 15, height _0  (1.702 m), weight 71.7 kg, SpO2 93 %.    FiO2 (%):  [100 %] 100 %   Intake/Output Summary (Last 24 hours) at 10/15/2018 0726 Last data filed at 10/15/2018 0600 Gross per 24 hour  Intake 1080 ml  Output 1775 ml  Net -695 ml   Filed Weights   10/06/2018 1638 10/12/18 0353 10/13/18  0425  Weight: 71.7 kg 71.9 kg 71.7 kg    Examination: GEN: elderly man in NAD HEENT: NRB in place, trachea mideline CV: RRR, ext warm PULM: diminished bases with crackles, no accessory muscle use GI: Soft, +BS EXT: No edema, + muscle wasting NEURO: Moves all 4 ext to command PSYCH: AOx3, fair insight SKIN: No rashes   Resolved Hospital Problem list   Hypotension - in setting of lasix   Assessment & Plan:  # AE-ILD: imaging, labs, hx supportive of this, ?radiation or treatment related pneumonitis.   No meds jumping out as possible cause.  Marked volume loss and progressive fibrotic change on CT.  Unresponsive to steroids portending poor prognosis. - Drop steroids some more to help with anxiety since not making any difference with lung recovery - f/u echo/bubble - Pulmonary toileting as able, patient's O2 requirements don't really allow for much, cannot even perform IS - PRN opiates, RN will ask patient if he is willing to do morphine drip - Continue PPI/H2B/azithromycin/nebs as ordered - Stop lasix, contraction alkalosis, hypernatremia, uremia already with minimal doses - Poor prognosis, DNR, I have nothing left to offer, will try to swing around and talk to him later today regarding goals of care.  May need to get palliative involved.  # Hx DVT - Continue eliquis  Erskine Emery MD PCCM

## 2018-10-15 NOTE — Progress Notes (Signed)
Checked with patient at 2 hrs and he did want his Morphine so I gave 2mg . His sats were in the low 90s, but with him telling me his order for breakfast he desats to low 80s. I ordered his breakfast.

## 2018-10-16 DIAGNOSIS — Z515 Encounter for palliative care: Secondary | ICD-10-CM

## 2018-10-16 MED ORDER — MORPHINE SULFATE (PF) 4 MG/ML IV SOLN: 4.0000 mg | INTRAVENOUS | Status: DC | PRN

## 2018-10-16 MED ORDER — SODIUM CHLORIDE 0.9 % IV SOLN
0.0000 mg/h | INTRAVENOUS | Status: DC
  Administered 2018-10-16: 18 mg/h via INTRAVENOUS
  Administered 2018-10-16: 12 mg/h via INTRAVENOUS
  Administered 2018-10-17: 18 mg/h via INTRAVENOUS
  Filled 2018-10-16: qty 10
  Filled 2018-10-16 (×4): qty 5

## 2018-10-16 MED ORDER — GLYCOPYRROLATE 0.2 MG/ML IJ SOLN: 0.2000 mg | INTRAMUSCULAR | Status: DC | PRN

## 2018-10-16 MED ORDER — HALOPERIDOL LACTATE 5 MG/ML IJ SOLN
INTRAMUSCULAR | Status: AC
  Administered 2018-10-16: 5 mg
  Filled 2018-10-16: qty 1

## 2018-10-16 MED ORDER — GLYCOPYRROLATE 1 MG PO TABS: 1.0000 mg | ORAL_TABLET | ORAL | Status: DC | PRN

## 2018-10-16 MED ORDER — HALOPERIDOL LACTATE 5 MG/ML IJ SOLN: 5.0000 mg | Freq: Once | INTRAMUSCULAR | Status: AC

## 2018-10-16 MED ORDER — LORAZEPAM 2 MG/ML IJ SOLN
1.0000 mg | INTRAMUSCULAR | Status: DC | PRN
  Administered 2018-10-16: 1 mg via INTRAVENOUS
  Filled 2018-10-16: qty 1

## 2018-10-16 MED ORDER — LORAZEPAM 2 MG/ML IJ SOLN
1.0000 mg | INTRAMUSCULAR | Status: DC | PRN
  Administered 2018-10-16 (×2): 1 mg via INTRAVENOUS
  Filled 2018-10-16 (×2): qty 1

## 2018-10-16 NOTE — Progress Notes (Signed)
Pt. Sent to 6th floor room 1606. Pt. Belongs sent with patient. Report given to receiving nurse.   Ronny Flurry RN

## 2018-10-16 NOTE — Progress Notes (Signed)
Nutrition Brief Note  Patient assessed by this RD on 10/1 prior to patient transferring to [address] [place] from the 4th floor. Chart reviewed. Pt now transitioning to comfort care. Dr. Thompson Caul note from yesterday afternoon states hospital death is anticipated. No further nutrition interventions warranted at this time.  Please re-consult as needed.      Jarome Matin, MS, RD, LDN, [facility] Inpatient Clinical Dietitian Pager # [phone] After hours/weekend pager # [phone]

## 2018-10-16 NOTE — Progress Notes (Signed)
Full note to follow   Comfort care measures,  Titrate up morphine for comfort  Dc high flow oxygen and keep on 2 liters for comfort Dc tele  Increase ativan q2hrs Consult hospice , will transfer if death does not occur in hospital next 24 hours  RN to pronounce death if occurs in the hospital.

## 2018-10-16 NOTE — Progress Notes (Signed)
Seen in f/u for end stage hypoxic respiratory failure due to presumed ILD. No events, on morphine drip, still appears uncomfortable. Unresponsive, belly-breathing, tachypneic Approaching end of life Increase morphine drip and PRNs Should be fine to transfer to floor Will s/o, call if questions/concerns Death expected within 48h  Erskine Emery MD

## 2018-10-16 NOTE — Progress Notes (Signed)
Pt arrived on floor with family at bedside. No s/s of pain or discomfort upon arrival. No distress noted. Will continue to monitor.

## 2018-10-16 NOTE — Progress Notes (Signed)
   10/16/18 1100  Clinical Encounter Type  Visited With Patient and family together  Visit Type Psychological support;Spiritual support;Initial;Critical Care  Referral From Nurse  Consult/Referral To Chaplain  Spiritual Encounters  Spiritual Needs Emotional;Other (Comment) (Spiritual Care Conversation/Support)  Stress Factors  Patient Stress Factors Not reviewed  Family Stress Factors Loss;Major life changes   I visited with Austin Yu and his daughter who was present at the bedside. Austin Yu was not aware that I was there. No needs were identified at this time.   Please, contact Spiritual Care for further assistance.   Chaplain Shanon Ace M.Div., [facility]

## 2018-10-16 NOTE — Progress Notes (Signed)
PROGRESS NOTE    Austin Yu  OEV:[id number] DOB: Jun 17, 1945 DOA: 10/03/2018 PCP: Deland Pretty, MD    Brief Narrative:  73 year old male with history of hyperlipidemia, BPH, GERD, rectal cancer diagnosed on 01/2018, right jugular vein DVT, idiopathic  pulmonary fibrosis not on home oxygen, lung cancer s/p chemotherapy and radiation therapy presented to the emergency room from cancer center with nonproductive cough and progressive shortness of breath.  In the emergency room, he was on 3 L oxygen, CTA of the lungs showed bilateral lower lobe opacities concerning for worsening inflammation or edema.  09/10/2018: Patient started requiring high levels of oxygen.  Transferred to stepdown unit and treated as exacerbation of interstitial pulmonary fibrosis. His condition continued to deteriorate, did not respond to high dose of steroids.  Subjective: Seen in the morning.  Brother at the bedside.  Waiting for his daughters.  On escalating dose of morphine.  Uncomfortable on high flow oxygen.  Assessment & Plan:   Principal Problem:   Acute respiratory failure with hypoxia (HCC) Active Problems:   IPF (idiopathic pulmonary fibrosis) ([place])   HCAP (healthcare-associated pneumonia)   Sepsis ([place])  Acute hypoxic respiratory failure in the setting of interstitial pulmonary fibrosis, lung cancer with radiation treatment: Acute exacerbation of interstitial pulmonary fibrosis. Lung cancer and rectal cancer treated with chemotherapy DVT  Patient remains in poor clinical status, very high oxygen requirement and progressive disease, hypoxic even on speaking or eating.  He is nearing  end-of-life. Comfort care measures,  Titrate up morphine for comfort  Dc high flow oxygen and keep on 2 liters for comfort Dc tele  Increase ativan q2hrs RN to pronounce death if occurs in the hospital. Death will probably occur in the hospital, not stable to transfer to hospice.    DVT prophylaxis: Eliquis,  discontinue Code Status: Comfort care Family Communication: Brother, 2 daughters at bedside Disposition Plan: Comfort care measures, anticipating death will happen in the hospital.   Consultants:   Oncology  Pulmonary  Procedures:   None  Antimicrobials:   Vancomycin cefepime, 10/01/2018--10/10/2018  Azithromycin, 10/11/2018---   Objective: Vitals:   10/16/18 0658 10/16/18 0700 10/16/18 0738 10/16/18 0800  BP:  103/77  109/78  Pulse:  98 96 98  Resp:  14 13 14   Temp: 98 F (36.7 C)   (!) 97.4 F (36.3 C)  TempSrc: Axillary   Axillary  SpO2:  90% 93% 90%  Weight:      Height:        Intake/Output Summary (Last 24 hours) at 10/16/2018 1020 Last data filed at 10/16/2018 0800 Gross per 24 hour  Intake 324.82 ml  Output 900 ml  Net -575.18 ml   Filed Weights   10/05/2018 1638 10/12/18 0353 10/13/18 0425  Weight: 71.7 kg 71.9 kg 71.7 kg    Examination:  Physical Exam  Constitutional: He appears distressed.  Sick looking, unable to keep up conversation.  Accepting that he is dying.  HENT:  Head: Normocephalic and atraumatic.  Cardiovascular: Normal rate and regular rhythm.  Pulmonary/Chest: He is in respiratory distress.  Poor bilateral air entry.  Poor saturations.  Neurological:  Sleepy, lethargic, on distress, denies any pain     Data Reviewed: I have personally reviewed following labs and imaging studies  CBC: Recent Labs  Lab 10/10/18 1109 10/11/18 0500 10/12/18 0344 10/13/18 0430 10/15/18 0500  WBC 6.3 4.9 9.9 8.1 9.1  NEUTROABS 5.0 4.4 9.1* 7.4 8.2*  HGB 10.4* 10.1* 10.0* 9.5* 11.3*  HCT 32.5* 31.8* 30.8*  30.4* 36.3*  MCV 100.0 99.4 100.3* 100.7* 101.4*  PLT 102* 92* 99* 92* 95*   Basic Metabolic Panel: Recent Labs  Lab 10/10/18 1109 10/11/18 0500 10/12/18 0344 10/13/18 0430 10/15/18 0500  NA 138 138 137 140 142  K 3.5 3.9 4.0 4.2 4.2  CL 105 106 106 105 96*  CO2 23 24 24 27  35*  GLUCOSE 107* 151* 143* 139* 132*  BUN 10 17 26*  30* 35*  CREATININE 1.07 0.94 0.92 0.78 0.74  CALCIUM 8.5* 8.9 8.6* 8.5* 8.8*  MG  --  1.8 1.9 2.1 2.3  PHOS  --  3.1 2.3* 3.1 4.2   GFR: Estimated Creatinine Clearance: 79.2 mL/min (by C-G formula based on SCr of 0.74 mg/dL). Liver Function Tests: Recent Labs  Lab 10/10/18 1109  AST 31  ALT 15  ALKPHOS 54  BILITOT 0.8  PROT 6.1*  ALBUMIN 2.7*   No results for input(s): LIPASE, AMYLASE in the last 168 hours. No results for input(s): AMMONIA in the last 168 hours. Coagulation Profile: No results for input(s): INR, PROTIME in the last 168 hours. Cardiac Enzymes: No results for input(s): CKTOTAL, CKMB, CKMBINDEX, TROPONINI in the last 168 hours. BNP (last 3 results) No results for input(s): PROBNP in the last 8760 hours. HbA1C: No results for input(s): HGBA1C in the last 72 hours. CBG: No results for input(s): GLUCAP in the last 168 hours. Lipid Profile: No results for input(s): CHOL, HDL, LDLCALC, TRIG, CHOLHDL, LDLDIRECT in the last 72 hours. Thyroid Function Tests: No results for input(s): TSH, T4TOTAL, FREET4, T3FREE, THYROIDAB in the last 72 hours. Anemia Panel: No results for input(s): VITAMINB12, FOLATE, FERRITIN, TIBC, IRON, RETICCTPCT in the last 72 hours. Sepsis Labs: Recent Labs  Lab 10/10/18 1007 10/10/18 1109 10/11/18 0500 10/12/18 0344  PROCALCITON <0.10  --  0.14 0.12  LATICACIDVEN  --  0.9  --   --     Recent Results (from the past 240 hour(s))  SARS Coronavirus 2 [facility] order, Performed in [facility] hospital lab) Nasopharyngeal Nasopharyngeal Swab     Status: None   Collection Time: 09/18/2018  4:49 PM   Specimen: Nasopharyngeal Swab  Result Value Ref Range Status   SARS Coronavirus 2 NEGATIVE NEGATIVE Final    Comment: (NOTE) If result is NEGATIVE SARS-CoV-2 target nucleic acids are NOT DETECTED. The SARS-CoV-2 RNA is generally detectable in upper and lower  respiratory specimens during the acute phase of infection. The lowest   concentration of SARS-CoV-2 viral copies this assay can detect is 250  copies / mL. A negative result does not preclude SARS-CoV-2 infection  and should not be used as the sole basis for treatment or other  patient management decisions.  A negative result may occur with  improper specimen collection / handling, submission of specimen other  than nasopharyngeal swab, presence of viral mutation(s) within the  areas targeted by this assay, and inadequate number of viral copies  (<250 copies / mL). A negative result must be combined with clinical  observations, patient history, and epidemiological information. If result is POSITIVE SARS-CoV-2 target nucleic acids are DETECTED. The SARS-CoV-2 RNA is generally detectable in upper and lower  respiratory specimens dur ing the acute phase of infection.  Positive  results are indicative of active infection with SARS-CoV-2.  Clinical  correlation with patient history and other diagnostic information is  necessary to determine patient infection status.  Positive results do  not rule out bacterial infection or co-infection with other viruses. If result  is PRESUMPTIVE POSTIVE SARS-CoV-2 nucleic acids MAY BE PRESENT.   A presumptive positive result was obtained on the submitted specimen  and confirmed on repeat testing.  While 2019 novel coronavirus  (SARS-CoV-2) nucleic acids may be present in the submitted sample  additional confirmatory testing may be necessary for epidemiological  and / or clinical management purposes  to differentiate between  SARS-CoV-2 and other Sarbecovirus currently known to infect humans.  If clinically indicated additional testing with an alternate test  methodology [phone]) is advised. The SARS-CoV-2 RNA is generally  detectable in upper and lower respiratory sp ecimens during the acute  phase of infection. The expected result is Negative. Fact Sheet for Patients:  StrictlyIdeas.no Fact Sheet  for Healthcare Providers: BankingDealers.co.za This test is not yet approved or cleared by the [place] FDA and has been authorized for detection and/or diagnosis of SARS-CoV-2 by FDA under an Emergency Use Authorization (EUA).  This EUA will remain in effect (meaning this test can be used) for the duration of the COVID-19 declaration under Section 564(b)(1) of the Act, 21 U.S.C. section 360bbb-3(b)(1), unless the authorization is terminated or revoked sooner. Performed at [facility], [place] [address] [place], [place] [address]   MRSA PCR Screening     Status: None   Collection Time: 10/09/18  4:22 PM   Specimen: Nasal Mucosa; Nasopharyngeal  Result Value Ref Range Status   MRSA by PCR NEGATIVE NEGATIVE Final    Comment:        The GeneXpert MRSA Assay (FDA approved for NASAL specimens only), is one component of a comprehensive MRSA colonization surveillance program. It is not intended to diagnose MRSA infection nor to guide or monitor treatment for MRSA infections. Performed at [facility], [place] [address], [place], [place] [address]   Culture, blood (routine x 2)     Status: None   Collection Time: 10/10/18  8:06 AM   Specimen: BLOOD LEFT HAND  Result Value Ref Range Status   Specimen Description   Final    BLOOD LEFT HAND Performed at [place] [address], [place], [place] [address]    Special Requests   Final    BOTTLES DRAWN AEROBIC AND ANAEROBIC Blood Culture adequate volume Performed at [place] [address] [place], [place] [address]    Culture   Final    NO GROWTH 5 DAYS Performed at [facility], [place] [address] [place], [place] [address]    Report Status 10/15/2018 FINAL  Final  Culture, blood (routine x 2)     Status: None   Collection Time: 10/10/18  8:06 AM   Specimen: BLOOD  Result Value Ref Range Status   Specimen Description    Final    BLOOD RIGHT ARM Performed at [place] [address] [place], [place] [address]    Special Requests   Final    BOTTLES DRAWN AEROBIC ONLY Blood Culture adequate volume Performed at [place] [address], [place], [place] [address]    Culture   Final    NO GROWTH 5 DAYS Performed at [facility], [place] [address], [place], [place] [address]    Report Status 10/15/2018 FINAL  Final  Respiratory Panel by PCR     Status: None   Collection Time: 10/10/18 11:32 AM   Specimen: Nasopharyngeal Swab; Respiratory  Result Value Ref Range Status   Adenovirus NOT DETECTED NOT DETECTED Final  Coronavirus 229E NOT DETECTED NOT DETECTED Final    Comment: (NOTE) The Coronavirus on the Respiratory Panel, DOES NOT test for the novel  Coronavirus (2019 nCoV)    Coronavirus HKU1 NOT DETECTED NOT DETECTED Final   Coronavirus NL63 NOT DETECTED NOT DETECTED Final   Coronavirus OC43 NOT DETECTED NOT DETECTED Final   Metapneumovirus NOT DETECTED NOT DETECTED Final   Rhinovirus / Enterovirus NOT DETECTED NOT DETECTED Final   Influenza A NOT DETECTED NOT DETECTED Final   Influenza B NOT DETECTED NOT DETECTED Final   Parainfluenza Virus 1 NOT DETECTED NOT DETECTED Final   Parainfluenza Virus 2 NOT DETECTED NOT DETECTED Final   Parainfluenza Virus 3 NOT DETECTED NOT DETECTED Final   Parainfluenza Virus 4 NOT DETECTED NOT DETECTED Final   Respiratory Syncytial Virus NOT DETECTED NOT DETECTED Final   Bordetella pertussis NOT DETECTED NOT DETECTED Final   Chlamydophila pneumoniae NOT DETECTED NOT DETECTED Final   Mycoplasma pneumoniae NOT DETECTED NOT DETECTED Final    Comment: Performed at [facility], [place] [address] [place], [place] [address]         Radiology Studies: No results found.      Scheduled Meds: . Chlorhexidine Gluconate Cloth  6 each Topical Daily  . feeding supplement  1 Container Oral Q24H  . feeding  supplement (ENSURE ENLIVE)  237 mL Oral BID BM  . feeding supplement (PRO-STAT SUGAR FREE 64)  30 mL Oral BID  . mouth rinse  15 mL Mouth Rinse BID  . pantoprazole  40 mg Oral Daily  . ramelteon  8 mg Oral QHS  . senna-docusate  1 tablet Oral BID   Continuous Infusions: . sodium chloride    . morphine 3 mg/hr (10/16/18 0800)     LOS: 8 days    Time spent: 30 minutes    Barb Merino, MD [facility] Pager [phone]  If 7PM-7AM, please contact night-coverage [email or web address] Password [email or web address] 10/16/2018, 10:20 AM

## 2018-11-10 NOTE — Progress Notes (Signed)
Patient's daughter and family asked questions regarding oxygen therapy for patient and requested to to talk to doctor. Dr. Sloan Leiter paged and talked to family. Per Dr. Sloan Leiter with permission of family it was requested to take patient off of 12L non-rebreather and place on 2L nasal cannula.

## 2018-11-10 NOTE — Progress Notes (Signed)
PROGRESS NOTE    Austin Yu  OBS:[id number] DOB: 1945/02/12 DOA: 10/04/2018 PCP: Deland Pretty, MD    Brief Narrative:  73 year old male with history of hyperlipidemia, BPH, GERD, rectal cancer diagnosed on 01/2018, right jugular vein DVT, idiopathic  pulmonary fibrosis not on home oxygen, lung cancer s/p chemotherapy and radiation therapy presented to the emergency room from cancer center with nonproductive cough and progressive shortness of breath.  In the emergency room, he was on 3 L oxygen, CTA of the lungs showed bilateral lower lobe opacities concerning for worsening inflammation or edema.  09/10/2018: Patient started requiring high levels of oxygen.  Transferred to stepdown unit and treated as exacerbation of interstitial pulmonary fibrosis. 10/15/2018. His condition continued to deteriorate, did not respond to high dose of steroids and changed to comfort care and now actively dying.  Subjective: Seen with patient's daughter at the bedside.  Remains on morphine infusion.  Unresponsive and obtunded.    Assessment & Plan:   Principal Problem:   Acute respiratory failure with hypoxia (HCC) Active Problems:   IPF (idiopathic pulmonary fibrosis) ([place])   HCAP (healthcare-associated pneumonia)   Sepsis ([place])  Acute hypoxic respiratory failure in the setting of interstitial pulmonary fibrosis, lung cancer with radiation treatment: Acute exacerbation of interstitial pulmonary fibrosis. Lung cancer and rectal cancer treated with chemotherapy. DVT  Patient is actively dying.   Comfort care measures,  Titrate up morphine for comfort , Ativan and haloperidol as needed. Oxygen for comfort.   RN to pronounce death if occurs in the hospital. Death will probably occur in the hospital, patient is actively dying, he is not able to be transferred to hospice facility.   DVT prophylaxis: Eliquis, discontinue Code Status: Comfort care Family Communication: Daughter at the bedside.   Disposition  Plan: Comfort care measures, anticipating death will happen in the hospital.   Consultants:   Oncology  Pulmonary  Procedures:   None  Antimicrobials:   Vancomycin cefepime, 09/27/2018--10/10/2018  Azithromycin, 10/11/2018--- 10/16/2018   Objective: Vitals:   10/16/18 0900 10/16/18 1200 10/16/18 1540 11-05-2018 1013  BP: 109/76  (!) 75/47 (!) 64/46  Pulse: 98 (!) 132 (!) 138 98  Resp: 12 13 16    Temp:   98.2 F (36.8 C)   TempSrc:   Axillary   SpO2: 93% (!) 87% (!) 84% 100%  Weight:      Height:        Intake/Output Summary (Last 24 hours) at Nov 05, 2018 1044 Last data filed at 11-05-2018 0900 Gross per 24 hour  Intake 30.92 ml  Output 0 ml  Net 30.92 ml   Filed Weights   10/01/2018 1638 10/12/18 0353 10/13/18 0425  Weight: 71.7 kg 71.9 kg 71.7 kg    Examination:  Patient is obtunded, unresponsive with shallow intermittent breathing. Poor air entry bilateral lungs. Unable to interact. Abdomen soft and nontender. Legs with no edema.  No sores or ulcerations.   Data Reviewed: I have personally reviewed following labs and imaging studies  CBC: Recent Labs  Lab 10/10/18 1109 10/11/18 0500 10/12/18 0344 10/13/18 0430 10/15/18 0500  WBC 6.3 4.9 9.9 8.1 9.1  NEUTROABS 5.0 4.4 9.1* 7.4 8.2*  HGB 10.4* 10.1* 10.0* 9.5* 11.3*  HCT 32.5* 31.8* 30.8* 30.4* 36.3*  MCV 100.0 99.4 100.3* 100.7* 101.4*  PLT 102* 92* 99* 92* 95*   Basic Metabolic Panel: Recent Labs  Lab 10/10/18 1109 10/11/18 0500 10/12/18 0344 10/13/18 0430 10/15/18 0500  NA 138 138 137 140 142  K 3.5 3.9  4.0 4.2 4.2  CL 105 106 106 105 96*  CO2 23 24 24 27  35*  GLUCOSE 107* 151* 143* 139* 132*  BUN 10 17 26* 30* 35*  CREATININE 1.07 0.94 0.92 0.78 0.74  CALCIUM 8.5* 8.9 8.6* 8.5* 8.8*  MG  --  1.8 1.9 2.1 2.3  PHOS  --  3.1 2.3* 3.1 4.2   GFR: Estimated Creatinine Clearance: 79.2 mL/min (by C-G formula based on SCr of 0.74 mg/dL). Liver Function Tests: Recent Labs  Lab 10/10/18  1109  AST 31  ALT 15  ALKPHOS 54  BILITOT 0.8  PROT 6.1*  ALBUMIN 2.7*   No results for input(s): LIPASE, AMYLASE in the last 168 hours. No results for input(s): AMMONIA in the last 168 hours. Coagulation Profile: No results for input(s): INR, PROTIME in the last 168 hours. Cardiac Enzymes: No results for input(s): CKTOTAL, CKMB, CKMBINDEX, TROPONINI in the last 168 hours. BNP (last 3 results) No results for input(s): PROBNP in the last 8760 hours. HbA1C: No results for input(s): HGBA1C in the last 72 hours. CBG: No results for input(s): GLUCAP in the last 168 hours. Lipid Profile: No results for input(s): CHOL, HDL, LDLCALC, TRIG, CHOLHDL, LDLDIRECT in the last 72 hours. Thyroid Function Tests: No results for input(s): TSH, T4TOTAL, FREET4, T3FREE, THYROIDAB in the last 72 hours. Anemia Panel: No results for input(s): VITAMINB12, FOLATE, FERRITIN, TIBC, IRON, RETICCTPCT in the last 72 hours. Sepsis Labs: Recent Labs  Lab 10/10/18 1109 10/11/18 0500 10/12/18 0344  PROCALCITON  --  0.14 0.12  LATICACIDVEN 0.9  --   --     Recent Results (from the past 240 hour(s))  SARS Coronavirus 2 [facility] order, Performed in [facility] hospital lab) Nasopharyngeal Nasopharyngeal Swab     Status: None   Collection Time: 09/21/2018  4:49 PM   Specimen: Nasopharyngeal Swab  Result Value Ref Range Status   SARS Coronavirus 2 NEGATIVE NEGATIVE Final    Comment: (NOTE) If result is NEGATIVE SARS-CoV-2 target nucleic acids are NOT DETECTED. The SARS-CoV-2 RNA is generally detectable in upper and lower  respiratory specimens during the acute phase of infection. The lowest  concentration of SARS-CoV-2 viral copies this assay can detect is 250  copies / mL. A negative result does not preclude SARS-CoV-2 infection  and should not be used as the sole basis for treatment or other  patient management decisions.  A negative result may occur with  improper specimen collection / handling,  submission of specimen other  than nasopharyngeal swab, presence of viral mutation(s) within the  areas targeted by this assay, and inadequate number of viral copies  (<250 copies / mL). A negative result must be combined with clinical  observations, patient history, and epidemiological information. If result is POSITIVE SARS-CoV-2 target nucleic acids are DETECTED. The SARS-CoV-2 RNA is generally detectable in upper and lower  respiratory specimens dur ing the acute phase of infection.  Positive  results are indicative of active infection with SARS-CoV-2.  Clinical  correlation with patient history and other diagnostic information is  necessary to determine patient infection status.  Positive results do  not rule out bacterial infection or co-infection with other viruses. If result is PRESUMPTIVE POSTIVE SARS-CoV-2 nucleic acids MAY BE PRESENT.   A presumptive positive result was obtained on the submitted specimen  and confirmed on repeat testing.  While 2019 novel coronavirus  (SARS-CoV-2) nucleic acids may be present in the submitted sample  additional confirmatory testing may be necessary for epidemiological  and / or clinical management purposes  to differentiate between  SARS-CoV-2 and other Sarbecovirus currently known to infect humans.  If clinically indicated additional testing with an alternate test  methodology [phone]) is advised. The SARS-CoV-2 RNA is generally  detectable in upper and lower respiratory sp ecimens during the acute  phase of infection. The expected result is Negative. Fact Sheet for Patients:  StrictlyIdeas.no Fact Sheet for Healthcare Providers: BankingDealers.co.za This test is not yet approved or cleared by the [place] FDA and has been authorized for detection and/or diagnosis of SARS-CoV-2 by FDA under an Emergency Use Authorization (EUA).  This EUA will remain in effect (meaning this test can be  used) for the duration of the COVID-19 declaration under Section 564(b)(1) of the Act, 21 U.S.C. section 360bbb-3(b)(1), unless the authorization is terminated or revoked sooner. Performed at [facility], [place] [address], [place], [place] [address]   MRSA PCR Screening     Status: None   Collection Time: 10/09/18  4:22 PM   Specimen: Nasal Mucosa; Nasopharyngeal  Result Value Ref Range Status   MRSA by PCR NEGATIVE NEGATIVE Final    Comment:        The GeneXpert MRSA Assay (FDA approved for NASAL specimens only), is one component of a comprehensive MRSA colonization surveillance program. It is not intended to diagnose MRSA infection nor to guide or monitor treatment for MRSA infections. Performed at [facility], [place] [address] [place], [place] [address]   Culture, blood (routine x 2)     Status: None   Collection Time: 10/10/18  8:06 AM   Specimen: BLOOD LEFT HAND  Result Value Ref Range Status   Specimen Description   Final    BLOOD LEFT HAND Performed at [place] [address] [place], [place] [address]    Special Requests   Final    BOTTLES DRAWN AEROBIC AND ANAEROBIC Blood Culture adequate volume Performed at [place] [address], [place], [place] [address]    Culture   Final    NO GROWTH 5 DAYS Performed at [facility], [place] [address] [place], [place] [address]    Report Status 10/15/2018 FINAL  Final  Culture, blood (routine x 2)     Status: None   Collection Time: 10/10/18  8:06 AM   Specimen: BLOOD  Result Value Ref Range Status   Specimen Description   Final    BLOOD RIGHT ARM Performed at [place] [place] [address] [place], [place] [address]    Special Requests   Final    BOTTLES DRAWN AEROBIC ONLY Blood Culture adequate volume Performed at [place] [address] [place], [place] [address]    Culture   Final     NO GROWTH 5 DAYS Performed at [facility], [place] [address], [place], [place] [address]    Report Status 10/15/2018 FINAL  Final  Respiratory Panel by PCR     Status: None   Collection Time: 10/10/18 11:32 AM   Specimen: Nasopharyngeal Swab; Respiratory  Result Value Ref Range Status   Adenovirus NOT DETECTED NOT DETECTED Final   Coronavirus 229E NOT DETECTED NOT DETECTED Final    Comment: (NOTE) The Coronavirus on the Respiratory Panel, DOES NOT test for the novel  Coronavirus (2019 nCoV)    Coronavirus HKU1 NOT DETECTED NOT DETECTED Final   Coronavirus NL63 NOT DETECTED NOT DETECTED Final   Coronavirus OC43  NOT DETECTED NOT DETECTED Final   Metapneumovirus NOT DETECTED NOT DETECTED Final   Rhinovirus / Enterovirus NOT DETECTED NOT DETECTED Final   Influenza A NOT DETECTED NOT DETECTED Final   Influenza B NOT DETECTED NOT DETECTED Final   Parainfluenza Virus 1 NOT DETECTED NOT DETECTED Final   Parainfluenza Virus 2 NOT DETECTED NOT DETECTED Final   Parainfluenza Virus 3 NOT DETECTED NOT DETECTED Final   Parainfluenza Virus 4 NOT DETECTED NOT DETECTED Final   Respiratory Syncytial Virus NOT DETECTED NOT DETECTED Final   Bordetella pertussis NOT DETECTED NOT DETECTED Final   Chlamydophila pneumoniae NOT DETECTED NOT DETECTED Final   Mycoplasma pneumoniae NOT DETECTED NOT DETECTED Final    Comment: Performed at [facility], [place] [address], [place], [place] [address]         Radiology Studies: No results found.      Scheduled Meds: . Chlorhexidine Gluconate Cloth  6 each Topical Daily  . feeding supplement  1 Container Oral Q24H  . feeding supplement (ENSURE ENLIVE)  237 mL Oral BID BM  . feeding supplement (PRO-STAT SUGAR FREE 64)  30 mL Oral BID  . mouth rinse  15 mL Mouth Rinse BID   Continuous Infusions: . sodium chloride    . morphine 18 mg/hr (10/16/18 2203)     LOS: 9 days    Time spent: 30 minutes    Barb Merino, MD [facility] Pager [phone]  If 7PM-7AM, please contact night-coverage [email or web address] Password [email or web address] 11-03-18, 10:44 AM

## 2018-11-10 NOTE — Progress Notes (Addendum)
Wasted 18mls of IV Morphine 250mg  in sodium chloride 0.9% 9ml infusion.  Witnessed by Sheppard Penton, RN BSN - 42Mls of IV morphine wasted

## 2018-11-10 NOTE — Discharge Summary (Signed)
Death Summary  Austin Yu NOI:[id number] DOB: 20-Oct-1945 DOA: November 01, 2018  PCP: Deland Pretty, MD  Admit date: 11/01/2018 Date of Death: Nov 10, 2018  Final Diagnoses:  Principal Problem:   Acute respiratory failure with hypoxia ([place]) Active Problems:   IPF (idiopathic pulmonary fibrosis) ([place])   HCAP (healthcare-associated pneumonia)   Sepsis ([place])   History of present illness:  73 year old male with history of hyperlipidemia, BPH, GERD, rectal cancer diagnosed on 1/20, right jugular vein DVT, idiopathic pulmonary fibrosis not on home oxygen, lung cancer status post chemotherapy and radiation therapy who presented to the emergency room from cancer's care center with progressive shortness of breath and nonproductive cough.  In the emergency room, he required supplemental oxygen.  CTA of the lungs showed bilateral lower lobe opacities, worsening inflammation.  Hospital Course:  Patient was admitted to the hospital, he was found with exacerbation of idiopathic pulmonary fibrosis.  Patient continued to require high flow oxygen.  He was admitted to ICU and treated with high flow oxygen and high-dose steroids.  His condition continued to deteriorate, he did not respond to treatment.  Ultimately with patient's wishes, he was started on comfort care measures and died in the hospital with family at the bedside.   Time: 2044  Signed:  Barb Merino  [facility] 10/21/2018, 2:14 PM

## 2018-11-10 NOTE — Care Management Important Message (Signed)
Important Message  Patient Details IM Letter given to Marney Doctor RN to present to the Patient Name: Austin Yu MRN: [id number] Date of Birth: 01/21/1945   Medicare Important Message Given:  Yes     Kerin Salen October 29, 2018, 9:12 AM

## 2018-11-10 DEATH — deceased

## 2018-11-27 NOTE — Progress Notes (Signed)
  Radiation Oncology         [phone] ________________________________  Name: Austin Yu MRN: [id number]  Date: 09/17/2018  DOB: 1945-11-16  End of Treatment Note  Diagnosis:  Lung cancer     Indication for treatment::  curative       Radiation treatment dates:   08/05/18 - 09/17/18  Site/dose:   The patient was treated to the disease within the left lung initially to a dose of 60 Gy using a 4 field, 3-D conformal technique.  Narrative: The patient tolerated radiation treatment relatively well.   The patient did experience esophagitis during the course of treatment which required management.   Plan: The patient has completed radiation treatment. The patient will return to radiation oncology clinic for routine followup in one month. I advised the patient to call or return sooner if they have any questions or concerns related to their recovery or treatment. ________________________________  Jodelle Gross, M.D., Ph.D.

## 2021-05-18 IMAGING — CT CT CHEST WITH CONTRAST
2 of 3 series · 15 of 36 positions shown, 18 images · IV contrast (omnipaque)
Comparison: 05/28/2018

CLINICAL DATA: History of rectal cancer and recent diagnosis of
lung cancer.

EXAM:
CT CHEST WITH CONTRAST
TECHNIQUE: Multidetector CT imaging of the chest was performed during
intravenous contrast administration.
CONTRAST:  75mL OMNIPAQUE IOHEXOL 300 MG/ML  SOLN

[Series 2: axial st · axial · 0.60mm/px · z∈[-194,+54]mm · 12 of 146 slices shown, 15 images]
[im 11/146  mediastinal]
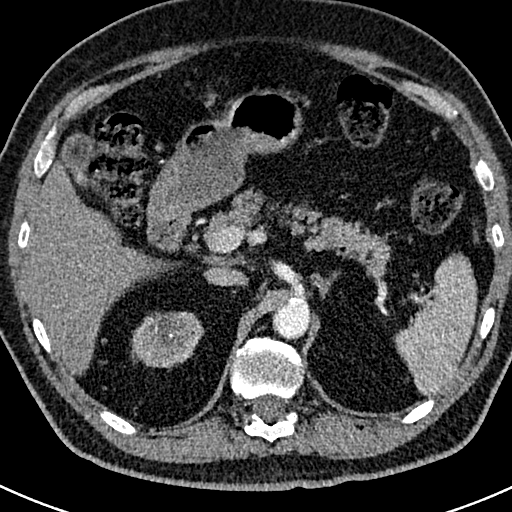
[im 11/146  lung]
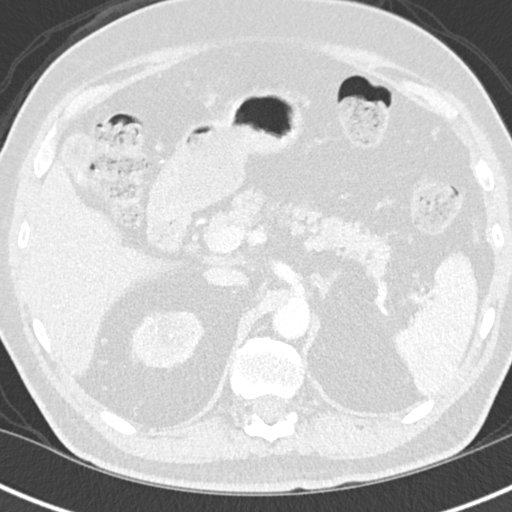
[im 22/146  lung]
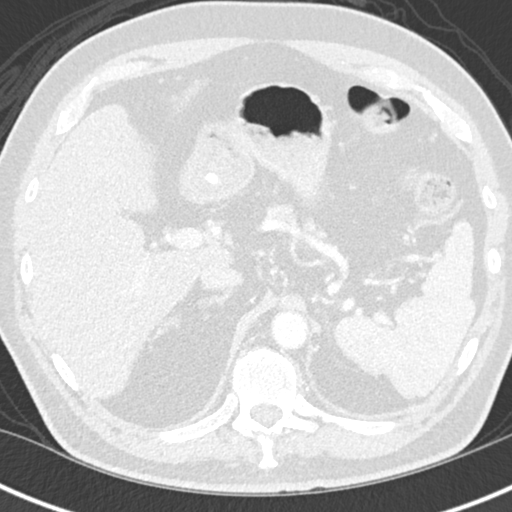
[im 33/146  lung]
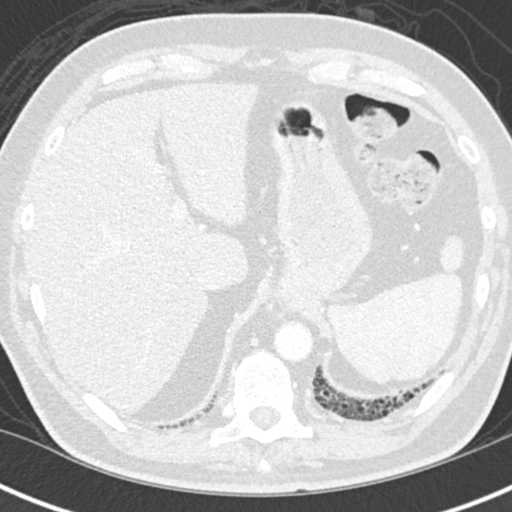
[im 43/146  lung]
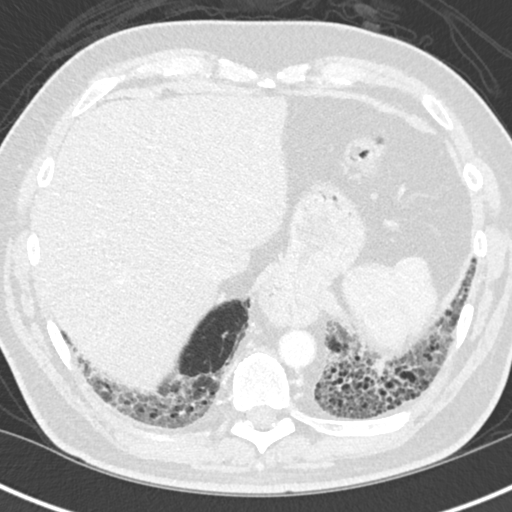
[im 54/146  mediastinal]
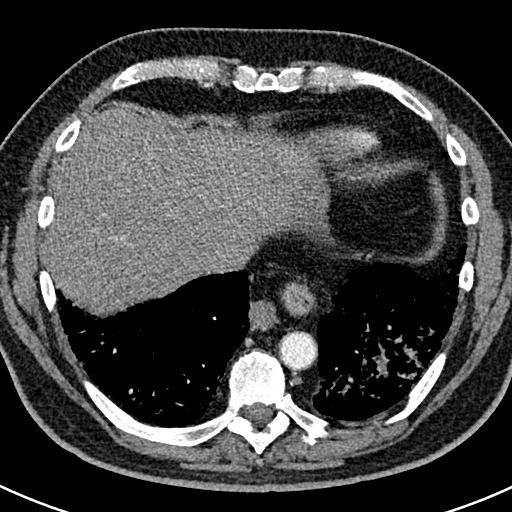
[im 54/146  lung]
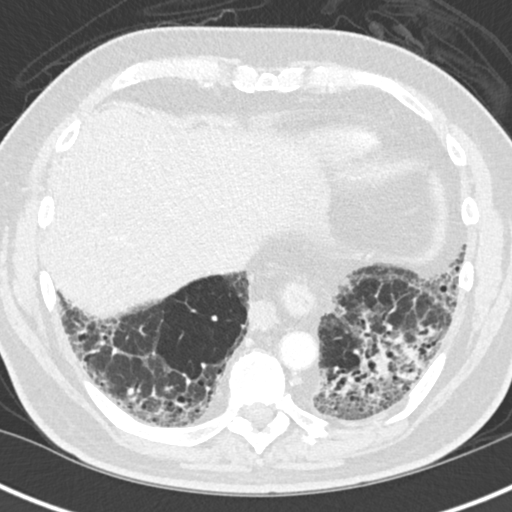
[im 65/146  lung]
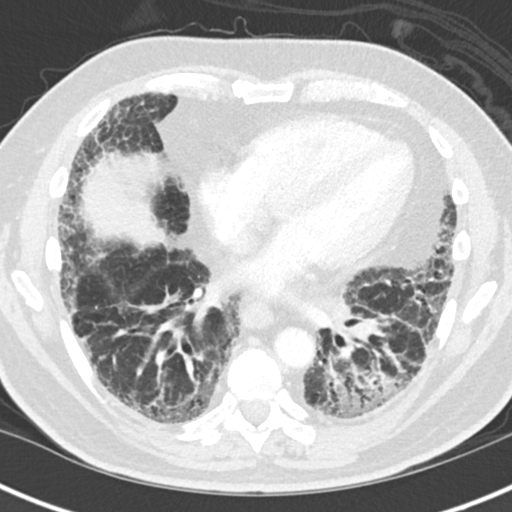
[im 81/146  lung]
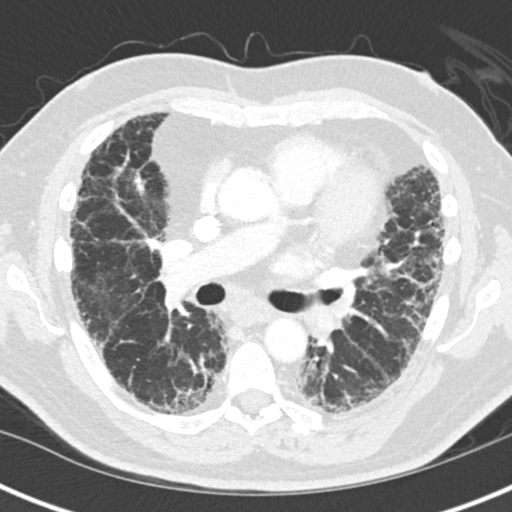
[im 92/146  lung]
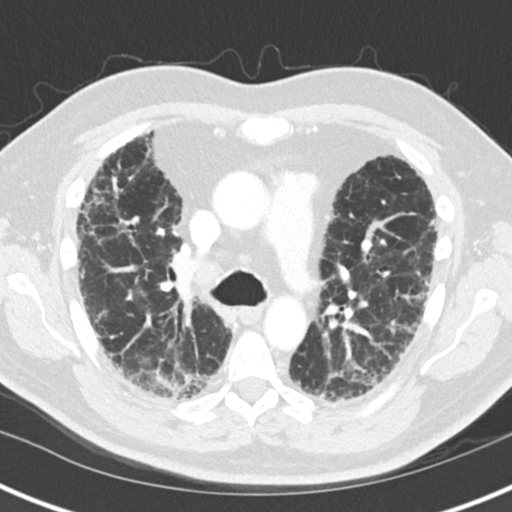
[im 103/146  mediastinal]
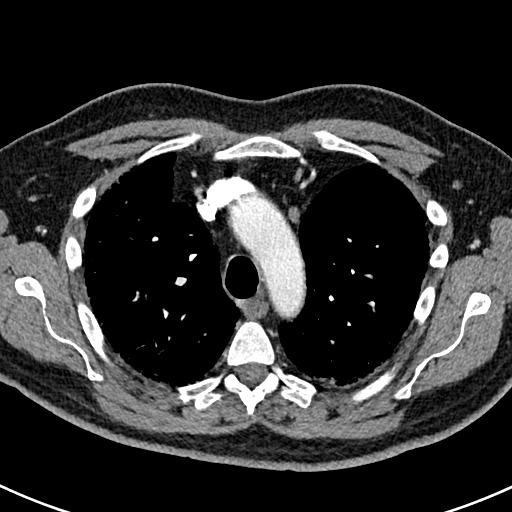
[im 103/146  lung]
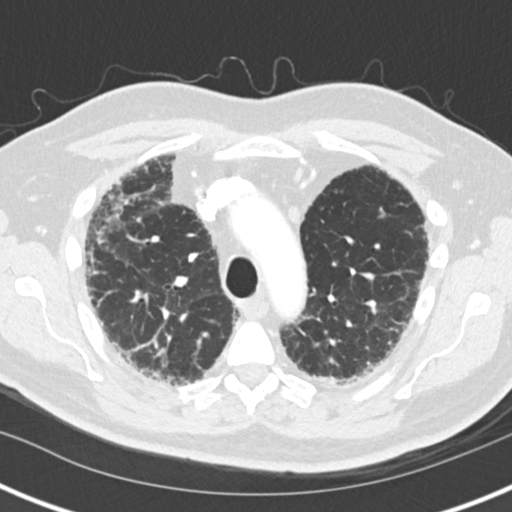
[im 113/146  lung]
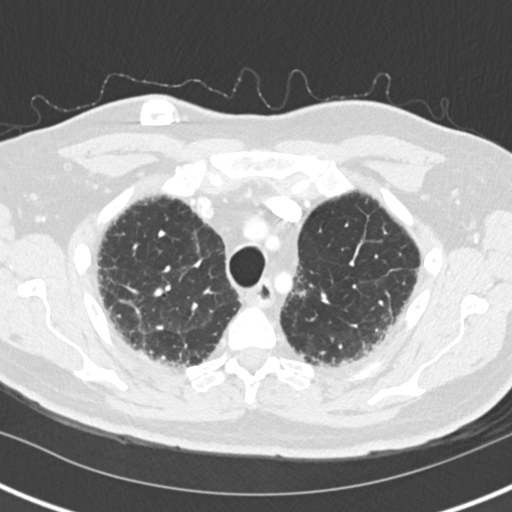
[im 124/146  lung]
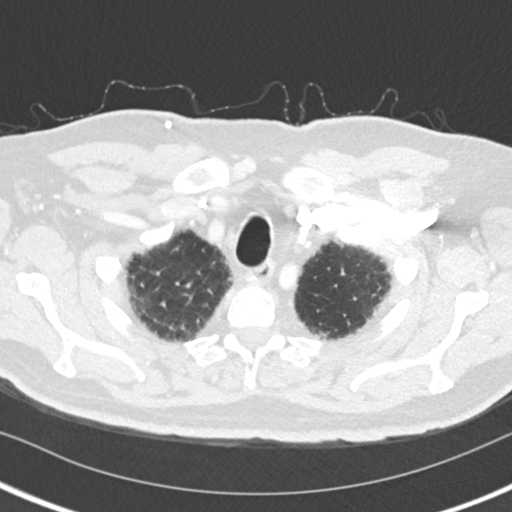
[im 135/146  lung]
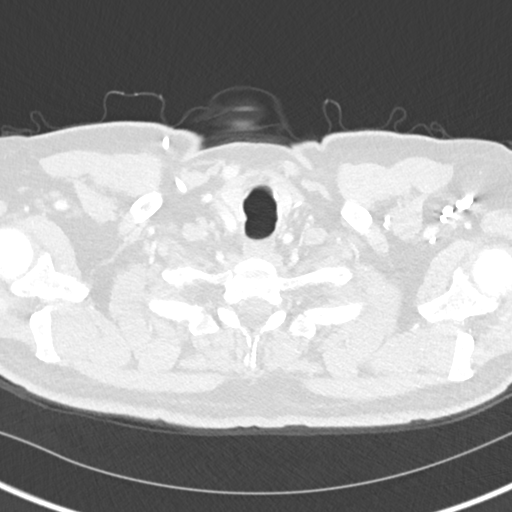

[Series 5: coronal · coronal · 0.60mm/px · 3 of 134 slices shown]
[im 27/134  lung]
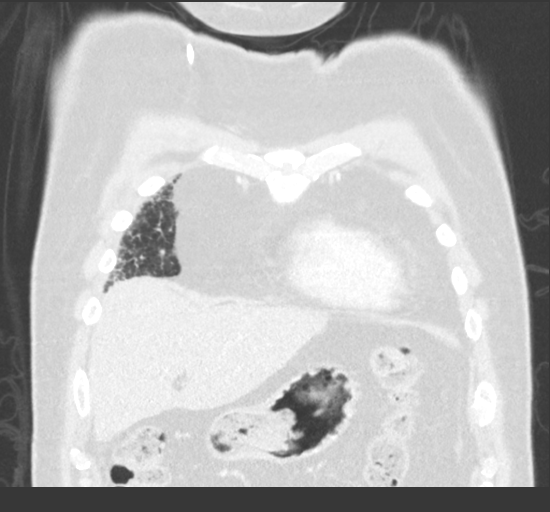
[im 54/134  lung]
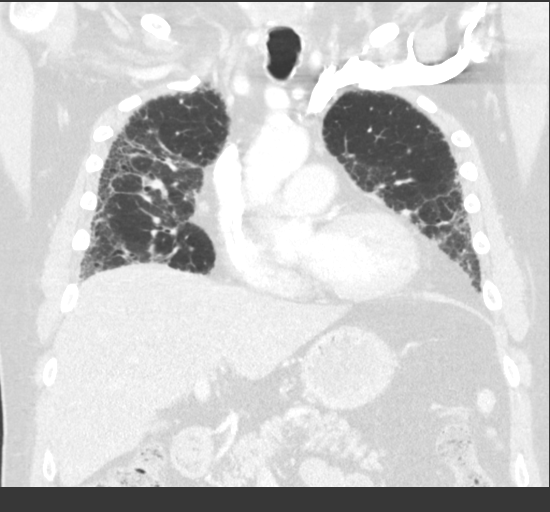
[im 80/134  lung]
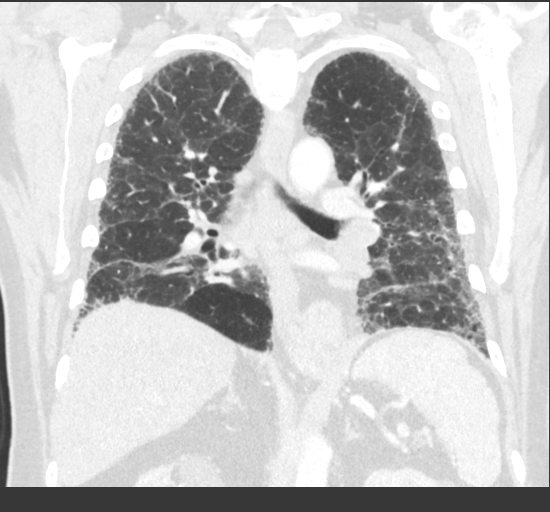

[15 of 36 positions shown; findings below may reference images not displayed]

FINDINGS: Cardiovascular: Heart size appears normal. No pericardial effusion.
Calcification in the LAD coronary artery noted.

Mediastinum/Nodes: Normal appearance of the thyroid gland. The
trachea appears patent and is midline. Small hiatal hernia.

-Index left hilar lymph node measures 1.9 by 1.9 cm, image 72/2.
previously 1.3 by 1.4 cm.

-Left infrahilar lymph node measures 2.0 x 1.8 cm, image 70 [DATE].
Previously 2.1 x 1.6 cm.

-New posterior mediastinal lymph node between the esophagus and
aorta measures 1.3 cm, image 73/2.

-Azygoesophageal lymph node on the right measures 1.2 cm, image
72/2. Previously 1.0 cm.

-Pre-vascular lymph node adjacent to the left main pulmonary artery
has lost its normal fatty hilum measuring 1 cm. Previously this
measured 0.9 cm.

Lungs/Pleura: No pleural effusion. Advanced changes of chronic,
fibrotic interstitial lung disease is again noted.

-left lower lobe perihilar nodule measures 1.7 x 1.2 cm, image 85/2.
Previously 1.3 x 0.7 cm.

-index lesion within the left lower lobe measures 1.7 by 1.2 cm,
image 101/7. Previously 1.5 x 1.2 cm.

Upper Abdomen: Hepatic steatosis noted. Multiple large stones within
the gallbladder measure up to 2 cm. Normal appearance of the adrenal
glands. Right kidney stones. Again seen is right-sided
hydronephrosis and right nephrolithiasis. The right UPJ, previously
containing a 1.7 cm obstructing stone is not imaged on this exam.

Musculoskeletal: No aggressive lytic or sclerotic bone lesions.
IMPRESSION: 1. Interval mild progression of mediastinal and hilar adenopathy.
2. Left lower lobe perihilar soft tissue nodule has increased in
size from previous exam. The left lung base nodule referenced on
previous exam is similar in size to previous study.
3. Chronic interstitial lung disease compatible with usual
interstitial pneumonitis.
4. Aortic Atherosclerosis ([id number]-G02.2). Coronary artery
calcifications.
5. Hiatal hernia
6. Persistent right-sided hydronephrosis. On previous examination
from 05/28/2018 patient was noted to have a right UPJ calculus.
